# Patient Record
Sex: Female | Born: 1945 | Race: White | Hispanic: No | Marital: Married | Smoking: Former smoker
Health system: Southern US, Community
[De-identification: ages and names within clinical notes are randomized; demographics above are authoritative.]

## PROBLEM LIST (undated history)

## (undated) DIAGNOSIS — E0500 Thyrotoxicosis with diffuse goiter without thyrotoxic crisis or storm: Secondary | ICD-10-CM

## (undated) DIAGNOSIS — H409 Unspecified glaucoma: Secondary | ICD-10-CM

## (undated) DIAGNOSIS — I10 Essential (primary) hypertension: Secondary | ICD-10-CM

## (undated) DIAGNOSIS — G2581 Restless legs syndrome: Secondary | ICD-10-CM

## (undated) DIAGNOSIS — E119 Type 2 diabetes mellitus without complications: Secondary | ICD-10-CM

## (undated) DIAGNOSIS — F32A Depression, unspecified: Secondary | ICD-10-CM

## (undated) DIAGNOSIS — M1990 Unspecified osteoarthritis, unspecified site: Secondary | ICD-10-CM

## (undated) DIAGNOSIS — N6009 Solitary cyst of unspecified breast: Secondary | ICD-10-CM

## (undated) DIAGNOSIS — E7800 Pure hypercholesterolemia, unspecified: Secondary | ICD-10-CM

## (undated) DIAGNOSIS — M779 Enthesopathy, unspecified: Secondary | ICD-10-CM

## (undated) DIAGNOSIS — Z86718 Personal history of other venous thrombosis and embolism: Secondary | ICD-10-CM

## (undated) DIAGNOSIS — D689 Coagulation defect, unspecified: Secondary | ICD-10-CM

## (undated) DIAGNOSIS — R7303 Prediabetes: Secondary | ICD-10-CM

## (undated) DIAGNOSIS — E785 Hyperlipidemia, unspecified: Secondary | ICD-10-CM

## (undated) DIAGNOSIS — K635 Polyp of colon: Secondary | ICD-10-CM

## (undated) DIAGNOSIS — T7840XA Allergy, unspecified, initial encounter: Secondary | ICD-10-CM

## (undated) DIAGNOSIS — E0590 Thyrotoxicosis, unspecified without thyrotoxic crisis or storm: Secondary | ICD-10-CM

## (undated) DIAGNOSIS — M8580 Other specified disorders of bone density and structure, unspecified site: Secondary | ICD-10-CM

## (undated) DIAGNOSIS — I82409 Acute embolism and thrombosis of unspecified deep veins of unspecified lower extremity: Secondary | ICD-10-CM

## (undated) DIAGNOSIS — K219 Gastro-esophageal reflux disease without esophagitis: Secondary | ICD-10-CM

## (undated) DIAGNOSIS — F419 Anxiety disorder, unspecified: Secondary | ICD-10-CM

## (undated) DIAGNOSIS — B019 Varicella without complication: Secondary | ICD-10-CM

## (undated) HISTORY — DX: Prediabetes: R73.03

## (undated) HISTORY — DX: Acute embolism and thrombosis of unspecified deep veins of unspecified lower extremity: I82.409

## (undated) HISTORY — DX: Other specified disorders of bone density and structure, unspecified site: M85.80

## (undated) HISTORY — DX: Unspecified glaucoma: H40.9

## (undated) HISTORY — DX: Restless legs syndrome: G25.81

## (undated) HISTORY — DX: Thyrotoxicosis with diffuse goiter without thyrotoxic crisis or storm: E05.00

## (undated) HISTORY — DX: Polyp of colon: K63.5

## (undated) HISTORY — DX: Unspecified osteoarthritis, unspecified site: M19.90

## (undated) HISTORY — DX: Allergy, unspecified, initial encounter: T78.40XA

## (undated) HISTORY — DX: Enthesopathy, unspecified: M77.9

## (undated) HISTORY — DX: Varicella without complication: B01.9

## (undated) HISTORY — DX: Anxiety disorder, unspecified: F41.9

## (undated) HISTORY — DX: Coagulation defect, unspecified: D68.9

## (undated) HISTORY — DX: Personal history of other venous thrombosis and embolism: Z86.718

## (undated) HISTORY — PX: BREAST CYST ASPIRATION: SHX578

## (undated) HISTORY — PX: DILATION AND CURETTAGE OF UTERUS: SHX78

## (undated) HISTORY — DX: Pure hypercholesterolemia, unspecified: E78.00

## (undated) HISTORY — DX: Depression, unspecified: F32.A

## (undated) HISTORY — DX: Essential (primary) hypertension: I10

## (undated) HISTORY — DX: Type 2 diabetes mellitus without complications: E11.9

## (undated) HISTORY — DX: Solitary cyst of unspecified breast: N60.09

## (undated) HISTORY — PX: EXCISION, BONE SPUR: SHX3949

---

## 1999-09-17 ENCOUNTER — Encounter: Payer: Self-pay | Admitting: Obstetrics and Gynecology

## 1999-09-17 ENCOUNTER — Encounter: Admission: RE | Admit: 1999-09-17 | Discharge: 1999-09-17 | Payer: Self-pay | Admitting: Obstetrics and Gynecology

## 2000-09-26 ENCOUNTER — Encounter: Admission: RE | Admit: 2000-09-26 | Discharge: 2000-09-26 | Payer: Self-pay | Admitting: Obstetrics and Gynecology

## 2000-09-26 ENCOUNTER — Encounter: Payer: Self-pay | Admitting: Obstetrics and Gynecology

## 2001-06-11 ENCOUNTER — Other Ambulatory Visit: Admission: RE | Admit: 2001-06-11 | Discharge: 2001-06-11 | Payer: Self-pay | Admitting: Obstetrics and Gynecology

## 2001-07-07 ENCOUNTER — Encounter: Payer: Self-pay | Admitting: Obstetrics and Gynecology

## 2001-07-07 ENCOUNTER — Encounter: Admission: RE | Admit: 2001-07-07 | Discharge: 2001-07-07 | Payer: Self-pay | Admitting: Obstetrics and Gynecology

## 2001-10-29 ENCOUNTER — Encounter: Admission: RE | Admit: 2001-10-29 | Discharge: 2001-10-29 | Payer: Self-pay | Admitting: Obstetrics and Gynecology

## 2001-10-29 ENCOUNTER — Encounter: Payer: Self-pay | Admitting: Obstetrics and Gynecology

## 2008-05-05 ENCOUNTER — Ambulatory Visit
Admission: RE | Admit: 2008-05-05 | Disposition: A | Discharge status: Home / Self Care | Payer: Self-pay | Source: Ambulatory Visit

## 2010-02-19 ENCOUNTER — Ambulatory Visit
Admission: RE | Admit: 2010-02-19 | Disposition: A | Discharge status: Home / Self Care | Payer: Self-pay | Source: Ambulatory Visit

## 2010-02-27 ENCOUNTER — Ambulatory Visit
Admission: RE | Admit: 2010-02-27 | Disposition: A | Discharge status: Home / Self Care | Payer: Self-pay | Source: Ambulatory Visit

## 2012-07-22 HISTORY — PX: COLONOSCOPY: SHX174

## 2012-10-12 ENCOUNTER — Encounter (INDEPENDENT_AMBULATORY_CARE_PROVIDER_SITE_OTHER): Payer: Self-pay | Admitting: Internal Medicine

## 2012-10-12 DIAGNOSIS — E0500 Thyrotoxicosis with diffuse goiter without thyrotoxic crisis or storm: Secondary | ICD-10-CM | POA: Insufficient documentation

## 2012-12-18 ENCOUNTER — Encounter (INDEPENDENT_AMBULATORY_CARE_PROVIDER_SITE_OTHER): Payer: Self-pay | Admitting: Internal Medicine

## 2012-12-18 NOTE — Progress Notes (Signed)
Outside lab

## 2012-12-22 ENCOUNTER — Encounter (INDEPENDENT_AMBULATORY_CARE_PROVIDER_SITE_OTHER): Payer: Self-pay | Admitting: Internal Medicine

## 2012-12-22 ENCOUNTER — Ambulatory Visit (INDEPENDENT_AMBULATORY_CARE_PROVIDER_SITE_OTHER): Payer: Medicare PPO | Admitting: Internal Medicine

## 2012-12-22 VITALS — BP 100/62 | HR 60 | Ht 65.75 in | Wt 199.0 lb

## 2012-12-22 NOTE — Patient Instructions (Addendum)
Below is a summary of information and instructions we reviewed at today's appointment. Please review this information carefully and call if you have any questions regarding it.     Thyroid: Your thyroid condition is stable. Continue off  thyroid medication. You are due for thyroid labs again in 6 months. . If you have any recurrent symptoms of hyperthyroidism such as palpitations, please call the office.        Elevated fasting glucose: We have reviewed that given the presence of elevated fasting glucose, you are at increased risk for developing diabetes. You can lower this risk or slow the rate at which you progress toward diabetes by working on your weight, increasing your exercise, with a goal of 30 minutes 5 days per week, and decreasing your intake of starches and simple sugars.

## 2012-12-22 NOTE — Progress Notes (Signed)
Provider Progress Note      Patient Name:  Lucynda Rosano [[id number]] DOB: [birth year]  Date: 12/23/2012       Subjective:         Kai Railsback is a 67 y.o. female seen for followup of Graves' disease. Since last visit she has been well overall but had labs for her PCP and fasting glucose was 111 and she was told to improve her diet and activity level. The patient notes no pain in the thyroid, enlargement of the thyroid, hoarseness or dysphagia. There have been no symptoms of hyperthyroidism such as tremors, nervousness, palpitations or sleep disturbance. There have been no symptoms of hypothyroidism such as constipation, cold intolerance, lethargy or dry skin. and Patient denies thyroid eye symptoms such as irritation, diplopia, photophobia, or increased prominence other than some dry eyes. .      Review of Systems  As above  The following portions of the patient's history were reviewed and updated as appropriate: allergies, current medications, past family history, past medical history, past social history and past surgical history.    Medications  Current Outpatient Prescriptions   Medication Sig Dispense Refill   . amitriptyline (ELAVIL) 10 MG tablet Take 10 mg by mouth nightly as needed.       Marland Kitchen amLODIPine-benazepril (LOTREL 5-20) 5-20 MG per capsule Take 1 capsule by mouth daily.       Marland Kitchen atorvastatin (LIPITOR) 20 MG tablet Take 20 mg by mouth daily.       . bimatoprost (LUMIGAN) 0.03 % ophthalmic drops Place 1 drop into both eyes nightly.       . brimonidine (ALPHAGAN) 0.2 % ophthalmic solution Place 1 drop into both eyes 2 (two) times daily.       . Calcium Carbonate-Vitamin D (CALTRATE 600+D PO) Take 1 tablet by mouth daily.       . Cholecalciferol (VITAMIN D PO) Take 5,000 IU by mouth daily.       Marland Kitchen CO-ENZYME Q-10 PO Take 300 mg by mouth daily.       . dorzolamide-timolol (COSOPT) 22.3-6.8 MG/ML ophthalmic solution Apply 1 drop to eye 2 (two) times daily.       Marland Kitchen FLUoxetine (PROZAC) 10 MG tablet Take  10 mg by mouth daily.       . Multiple Vitamin (MULTIVITAMIN) tablet Take 1 tablet by mouth daily.       . Omega-3 Fat Ac-Cholecalciferol (OMEGA ESSENTIALS/VIT D3) Liquid Take 750 mg by mouth daily.       Marland Kitchen omeprazole (PRILOSEC) 20 MG capsule Take 20 mg by mouth daily.                Objective:      Vital Signs: BP 100/62  Pulse 60  Ht 1.67 m (5' 5.75")  Wt 90.266 kg (199 lb)  BMI 32.37 kg/m2  Body mass index is 32.37 kg/(m^2).  PE  Well nourished, well appearing, in no acute distress.  HEENT: There is no stare or lid lag. There is no periorbital edema. Extraoccular movements are intact. No conjunctival injection.   Neck is supple without adenopathy.  The thyroid is normal in size and consistency and no nodules are appreciated.  Lungs are clear to auscultation.  Cardiac exam reveals a regular rate and rhythm. No murmur or gallop is appreciated.   Exam of the extremities reveals no peripheral edema.   Peripheral pulses are normal.   On neurologic exam, the patient is alert and appropriate. Gait and speech are normal.There  is no tremor of the outstretched hands. Reflexes are 1+ and symmetrical with  normal relaxation phases.   Skin is cool and dry.      Lab and Imaging Review  Fasting glucose 111, TSH/TFTs normal.    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 Impaired fasting glucose    Graves is in remission. Will recheck TSH in 6 weeks and 1 year. If both are normal, will refer her back to PCP for annual TSH testing. If she has recurrent symptoms of hyperthyroidism, asked her to call.  We have reviewed that given the presence of elevated fasting glucose, the patient is at increased risk for developing diabetes, and the patient was counseled about lifestyle modification as outlined below.           Plan:      See plan outlined in patient instructions below.    Patient Instructions   Below is a summary of information and instructions we reviewed at today's  appointment. Please review this information carefully and call if you have any questions regarding it.     Thyroid: Your thyroid condition is stable. Continue off  thyroid medication. You are due for thyroid labs again in 6 months. . If you have any recurrent symptoms of hyperthyroidism such as palpitations, please call the office.        Elevated fasting glucose: We have reviewed that given the presence of elevated fasting glucose, you are at increased risk for developing diabetes. You can lower this risk or slow the rate at which you progress toward diabetes by working on your weight, increasing your exercise, with a goal of 30 minutes 5 days per week, and decreasing your intake of starches and simple sugars.         Return in about 1 year (around 12/22/2013).    Vela Prose, MD

## 2013-01-06 ENCOUNTER — Encounter (INDEPENDENT_AMBULATORY_CARE_PROVIDER_SITE_OTHER): Payer: Self-pay | Admitting: Specialist

## 2013-01-06 ENCOUNTER — Encounter (INDEPENDENT_AMBULATORY_CARE_PROVIDER_SITE_OTHER): Payer: Self-pay

## 2013-01-06 NOTE — Progress Notes (Signed)
Miralax Prep Instructions    INSURANCE ISSUES:  If your colonoscopy has been scheduled for screening (meaning that you have no symptoms), and a polyp is found, it will removed during your colonoscopy. This will change your colonoscopy from a screening to a diagnostic procedure. This may change how your colonoscopy is billed. Patients with a history of colon polyps are not considered a screening colonoscopy and your insurance may not cover as a screening benefit.   Please check your benefits prior to your scheduled procedure.  Our office will call your insurance for preauthorization.  If you do not have insurance, please call the billing office at [phone] to make financial arrangements.     If you cannot make your appointment time, please immediately notify our office at [phone].      Your colonoscopy has been tentatively scheduled for February 11, 2013 .   You will receive a call from the OR scheduler within 2-3 days prior to your scheduled procedure date to find out what time to arrive for the procedure at the hospital.      The day of your procedure you will need to go to the Surgery Center at, [facility].  You can expect to spend between 2-3 hours at the Surgery Center after your arrival.       Due to being sedated during your procedure, you need to have a responsible adult drive you and pick you up from the hospital.  You will not be able to call a cab.     If you take heart or blood pressure medication, you may take them with a sip of water before coming to the hospital. Do not take any other medication in the morning.  You may resume to your normal medication schedule after your colonoscopy.     If you take diabetic medicines, please follow the following instructions:            a.  Insulin - take  dose night before and hold the morning of the procedure            b.  Glucophage/Metformin/Glucovance - hold day of prep and morning of the                 procedure   If you take a blood  thinner please follow the following instructions:  You will need to hold, aspirin and for 5 days                           To prepare for your test    Items you will need from the pharmacy/store:  Miralax - 8.3 ounces (238 grams) size bottle   Four Dulcolax Laxative Tablets (5mg )   Approximately 64 ounces of Gatorade or Crystal Light (Diabetics: Crystal Light) - Chilled if desired     The day before your procedure:  You will be on a clear liquid diet beginning the day prior to your procedure.  Do not consume drinks or Jell-O with red or purple coloring.  You are encouraged to drink plenty of Water!  In addition, you may have the following:            - Gatorade, Powerade, Soda, Crystal Light, Apple Juice, White Grape Juice, and             [employer].          - Jello-O (sugar free or regular)          -  Clear soup broth or bouillon, no solids          - Black coffee or tea (no milk or cream)          - You can have up to 3 eight ounce cans of Ensure or Sustacal  If you are a diabetic, do not use sugar free liquids for the first 24 hours before your procedure.  Mix the ENTIRE bottle of Miralax with the 64 ounces of Gatorade/Crystal Light and refrigerate until needed.   Take four (4) Dulcolax or Bisacodyl tablets between 4-6pm.   Two hours after taking the Dulcolax (Bisacodyl) tablets, begin drinking the Miralax solution.  Finish 6 cups (8 ounces every 10 - 15 minutes) before you go to bed. You should have about 2 cups of solution leftover for the morning of your procedure.     **Most people have a bowel movement an hour after starting your prep; some people may take longer. Even if you are having diarrhea prior to starting the prep, you need to compete the full prep.     YOU MUST COMPLETE ABOVE STEP.  It is not usual to have some nausea and/or abdominal cramping.  If this happens, you may want to wait 30-40 minutes before continuing with the solution.  However, all the steps need to be completed to have a successful  colonoscopy.      The day of your procedure:    4 Hours before arriving at the hospital, you will drink the remaining prep, 8 ounces every 10 minutes until gone.  You should have no additional clear liquids at this time.  Brushing your teeth/dentures is permitted.   If you have any medication that you need to take the morning of your procedure, do so with a small sip of water only.   If bowel movements are not clear (transparent yellow water) about 1 hour after your morning dose, call [phone] for further instructions after 8:00am.           Colonoscopyis used to view the inside of your lower digestive tract (colon and rectum). It can help screen for colon cancer and can also help find the source of abdominal pain,bleeding,and changes in bowel habits. The test is usually done in the hospital on an outpatient basis. During the exam, the doctor can remove a small tissue sample ( a biopsy) for testing. Small growths, such as polyps, may also be removed during colonoscopy.  A camera attached to a flexible tube with a viewing lens is used to take video pictures.    Getting Ready   Be sure to tell your doctor about any medications you take. Alsotell your doctor about any health conditions you may have.   Discuss the risks of the test with your doctor. These includebleeding and bowel puncture.   Your rectum and colon must be empty for the test. So be sure tofollow the diet and bowel prep instructions exactly. If you don't, the test may need to be rescheduled.   Ask your doctor whether you need to have a friend or family member prepared to drive you home after the test.  Colonoscopy provides an inside view of the entire colon.                            During the Test   You are given sedating (relaxing) medication through an IV line.You may be drowsy or completely asleep.   The procedure takes  or longer.   The doctor performs a digital rectal exam to check for anal andrectal problems. The  rectum is lubricated and the scope inserted.   If you are awake, you may have a feeling similar to needing to have a bowelmovement. You may also feel pressure as air is pumped into the colon. It's okay to pass gas during the procedure.      After the Test   You may discuss the results with your doctor right away or at a future visit.   Try to pass all the gas right after the test to help prevent bloating and cramping.   After the test, you can go back to your normal eating andother activities.  Risks and Possible Complications Include:   Bleeding  A puncture or tear in the colon  Risks of anesthesia       [address], [place], [place] [address]. All rights reserved. This information is not intended as a substitute for professional medical care. Always follow your healthcare professional's instructions.

## 2013-01-14 ENCOUNTER — Other Ambulatory Visit (INDEPENDENT_AMBULATORY_CARE_PROVIDER_SITE_OTHER): Payer: Self-pay

## 2013-02-05 ENCOUNTER — Ambulatory Visit: Admit: 2013-02-05 | Payer: Self-pay | Admitting: Specialist

## 2013-02-05 SURGERY — DONT USE, USE 1094-COLONOSCOPY, DIAGNOSTIC (SCREENING)
Anesthesia: Conscious Sedation

## 2013-02-11 ENCOUNTER — Ambulatory Visit: Payer: Medicare PPO | Admitting: Specialist

## 2013-02-11 ENCOUNTER — Encounter: Payer: Self-pay | Admitting: Certified Registered"

## 2013-02-11 ENCOUNTER — Ambulatory Visit: Payer: Medicare PPO | Admitting: Certified Registered"

## 2013-02-11 ENCOUNTER — Encounter: Payer: Self-pay | Admitting: Specialist

## 2013-02-11 ENCOUNTER — Ambulatory Visit
Admission: RE | Admit: 2013-02-11 | Discharge: 2013-02-11 | Disposition: A | Discharge status: Home / Self Care | Payer: Medicare PPO | Source: Ambulatory Visit | Attending: Specialist | Admitting: Specialist

## 2013-02-11 ENCOUNTER — Encounter
Admission: RE | Disposition: A | Discharge status: Home / Self Care | Payer: Self-pay | Source: Ambulatory Visit | Attending: Specialist

## 2013-02-11 DIAGNOSIS — M129 Arthropathy, unspecified: Secondary | ICD-10-CM | POA: Insufficient documentation

## 2013-02-11 DIAGNOSIS — Z1211 Encounter for screening for malignant neoplasm of colon: Secondary | ICD-10-CM | POA: Insufficient documentation

## 2013-02-11 DIAGNOSIS — F3289 Other specified depressive episodes: Secondary | ICD-10-CM | POA: Insufficient documentation

## 2013-02-11 DIAGNOSIS — I10 Essential (primary) hypertension: Secondary | ICD-10-CM | POA: Insufficient documentation

## 2013-02-11 DIAGNOSIS — H409 Unspecified glaucoma: Secondary | ICD-10-CM | POA: Insufficient documentation

## 2013-02-11 DIAGNOSIS — E7800 Pure hypercholesterolemia, unspecified: Secondary | ICD-10-CM | POA: Insufficient documentation

## 2013-02-11 DIAGNOSIS — D126 Benign neoplasm of colon, unspecified: Secondary | ICD-10-CM | POA: Insufficient documentation

## 2013-02-11 DIAGNOSIS — Z87891 Personal history of nicotine dependence: Secondary | ICD-10-CM | POA: Insufficient documentation

## 2013-02-11 DIAGNOSIS — K6289 Other specified diseases of anus and rectum: Secondary | ICD-10-CM | POA: Insufficient documentation

## 2013-02-11 DIAGNOSIS — Z7982 Long term (current) use of aspirin: Secondary | ICD-10-CM | POA: Insufficient documentation

## 2013-02-11 DIAGNOSIS — G2581 Restless legs syndrome: Secondary | ICD-10-CM | POA: Insufficient documentation

## 2013-02-11 DIAGNOSIS — E782 Mixed hyperlipidemia: Secondary | ICD-10-CM | POA: Insufficient documentation

## 2013-02-11 DIAGNOSIS — E119 Type 2 diabetes mellitus without complications: Secondary | ICD-10-CM | POA: Insufficient documentation

## 2013-02-11 DIAGNOSIS — E0500 Thyrotoxicosis with diffuse goiter without thyrotoxic crisis or storm: Secondary | ICD-10-CM | POA: Insufficient documentation

## 2013-02-11 HISTORY — PX: COLONOSCOPY: SHX174

## 2013-02-11 SURGERY — DONT USE, USE 1094-COLONOSCOPY, DIAGNOSTIC (SCREENING)
Anesthesia: Anesthesia MAC / Sedation | Site: Anus | Wound class: Dirty or Infected

## 2013-02-11 MED ORDER — MIDAZOLAM HCL 2 MG/2ML IJ SOLN
INTRAMUSCULAR | Status: DC | PRN
Start: 2013-02-11 — End: 2013-02-11
  Administered 2013-02-11: 11:00:00 5 mg via INTRAVENOUS

## 2013-02-11 MED ORDER — LACTATED RINGERS IV SOLN
INTRAVENOUS | Status: DC | PRN
Start: 2013-02-11 — End: 2013-02-11

## 2013-02-11 MED ORDER — LIDOCAINE HCL (PF) 2 % IJ SOLN
INTRAMUSCULAR | Status: DC | PRN
Start: 2013-02-11 — End: 2013-02-11
  Administered 2013-02-11: 11:00:00 40 mg via INTRADERMAL

## 2013-02-11 MED ORDER — PROPOFOL 10 MG/ML IV EMUL
INTRAVENOUS | Status: DC | PRN
Start: 2013-02-11 — End: 2013-02-11
  Administered 2013-02-11: 11:00:00 30 mg via INTRAVENOUS
  Administered 2013-02-11: 11:00:00 20 mg via INTRAVENOUS

## 2013-02-11 MED ORDER — FENTANYL CITRATE 0.05 MG/ML IJ SOLN
INTRAMUSCULAR | Status: DC | PRN
Start: 2013-02-11 — End: 2013-02-11
  Administered 2013-02-11: 100 ug via INTRAVENOUS

## 2013-02-11 SURGICAL SUPPLY — 26 items
BASIN EMESIS (Supply) ×2 IMPLANT
BASKET RETRIEVAL ROTH NET STD (Supply) ×1 IMPLANT
BRUSH CLEANING 639 (Supply) ×2 IMPLANT
CANNULA ADULT NASAL (Supply) ×1 IMPLANT
CRE BALLOON 12-15 DILATOR 5836 (TDC (Tubes, Draines, Catheters))
CRE BALLOON 12-15 DILATOR 5836 (TDC (Tubes, Drains, Catheters)) ×1 IMPLANT
CRE BALLOON 15-18 DILATOR 5837 (Supply) ×1 IMPLANT
CRE BALLOON 8-10 DILAT 5834 (Supply) ×2 IMPLANT
FORCEP BIOPSY ALLIGA JAW  2.8 (Supply) ×1 IMPLANT
FORCEP BIOPSY DISP. (Supply) ×2 IMPLANT
FORCEP BIOPSY HOT ALLIGATOR  D (Supply) ×1 IMPLANT
FORCEP RADIAL JAW JUMBO 240CM (Supply) ×1 IMPLANT
FORMALIN 15ML CONTAINER (BY CS (Supply) ×2 IMPLANT
LINER SUCTION 1500 RED (Supply) ×2 IMPLANT
MARKER ENDOSCOPIC SPOT (Supply) ×1 IMPLANT
MASK OXYGEN W/TUBING ADULT (Supply) ×2 IMPLANT
NDL BLUNT FILL 18GA X 1.5 (Supply) ×2 IMPLANT
PAD GROUNDING REM  (ELECTRODE) (Supply) ×1 IMPLANT
QUICK CLIP II SGL USE ROTATABL (Supply) ×1 IMPLANT
SNARE WIRE DISP 25MM LOOP (Supply) ×1 IMPLANT
SOL WATER STERILE IRRG 500ML (Solutions) ×1 IMPLANT
SYRINGE 50CC LL  NO WIDE FLANG (Supply) ×1 IMPLANT
TRAP SPECIMEN SMT1005 (Supply) ×1 IMPLANT
TUBE CONNECT SX 12IN STERILE (Supply) ×2 IMPLANT
TUBING SUCTION 10FT STERILE (Supply) ×1 IMPLANT
UNDERPAD BLUE 23X36  LF (Supply) ×3 IMPLANT

## 2013-02-11 NOTE — Consults (Signed)
BRIEF HISTORY AND PHYSICAL EXAM    Date Time: 02/11/2013 10:17 AM  Patient Name: Brittany Berry  Attending Physician: Dineen Kid, MD    Assessment:     --  Patient needs a screening colonoscopy.   She is asymptomatic.      --  No increased risk factors for colon cancer.      --  She is unsure, but thinks she was told that she had colitis at some point in the more remote past.  Currently, she is asymptomatic.        Plan:     --  For screening colonoscopy.  I outlined the nature of a colonoscopy with the patient.  All questions were answered.  Risks  (include, but are not limited to:   bleeding, infection, injury or perforation of the colon) and benefits were reviewed.  She understands these complications could require emergency abdominal surgery.  She understands and wishes to proceed.          History of Present Illness:     Brittany Berry is a 66 y.o. female who is accompanied by her husband.  She presents to the hospital with a desire to have a screening colonoscopy.  Last colonoscopy was about 10 years ago.   She has no colon symptoms.  No diarrhea or constipation.  No blood in her stools.      She recalls being told that she had colitis at some point in the more remote past.  She does not remember details.  Currently, she is asymptomatic.            Past Medical History:     Past Medical History   Diagnosis Date   . Arthritis    . Unspecified glaucoma(365.9)    . Mixed hyperlipidemia    . Essential hypertension, benign    . Tendonitis    . 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 Restless legs syndrome (RLS)    . Depression    . High cholesterol    . Diabetes mellitus      [facility]- NO MEDS REQUIRED AT THIS TIME.       Past Surgical History:     Past Surgical History   Procedure Date   . 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 Dilation and curettage of uterus    . Excision, bone spur      left little toe        Family History:     Family History   Problem  Relation Age of Onset   . Thyroid disease Sister    . Cancer Other      BREAST CANCER.   . Colon polyps Other    . Anesthesia problems Other        Social History:     History     Social History   . Marital Status: Married     Spouse Name: N/A     Number of Children: N/A   . Years of Education: N/A     Social History Main Topics   . Smoking status: Former Smoker     Quit date: 07/22/1982   . Smokeless tobacco: Never Used   . Alcohol Use: Yes      Comment: wine 3/week   . Drug Use: No   . Sexually Active: Not on file     Other Topics Concern   . Not on file  Social History Narrative   . No narrative on file       Allergies:     No Known Allergies    Medications:     Prescriptions prior to admission   Medication Sig   . amitriptyline (ELAVIL) 10 MG tablet Take 10 mg by mouth nightly as needed.   Marland Kitchen amLODIPine-benazepril (LOTREL 5-20) 5-20 MG per capsule Take 1 capsule by mouth daily.   Marland Kitchen aspirin 81 MG tablet Take 81 mg by mouth daily.   Marland Kitchen atorvastatin (LIPITOR) 20 MG tablet Take 20 mg by mouth daily.   . bimatoprost (LUMIGAN) 0.03 % ophthalmic drops Place 1 drop into both eyes nightly.   . brimonidine (ALPHAGAN) 0.2 % ophthalmic solution Place 1 drop into both eyes 2 (two) times daily.   . Calcium Carbonate-Vitamin D (CALTRATE 600+D PO) Take 1 tablet by mouth daily.   . Cetirizine HCl (ZYRTEC PO) Take by mouth.   . Cholecalciferol (VITAMIN D PO) Take 5,000 IU by mouth daily.   Marland Kitchen CO-ENZYME Q-10 PO Take 300 mg by mouth daily.   . dorzolamide-timolol (COSOPT) 22.3-6.8 MG/ML ophthalmic solution Apply 1 drop to eye 2 (two) times daily.   Marland Kitchen FLUoxetine (PROZAC) 10 MG tablet Take 10 mg by mouth daily.   . mometasone (NASONEX) 50 MCG/ACT nasal spray 2 sprays by Nasal route daily.   . Multiple Vitamin (MULTIVITAMIN) tablet Take 1 tablet by mouth daily.   . Omega-3 Fat Ac-Cholecalciferol (OMEGA ESSENTIALS/VIT D3) Liquid Take 750 mg by mouth daily.   Marland Kitchen omeprazole (PRILOSEC) 20 MG capsule Take 20 mg by mouth daily.       Review  of Systems:     See HPI.           Physical Exam:     Filed Vitals:    02/11/13 0857   BP: 107/69   Pulse: 79   Temp: 98.8 F (37.1 C)   Resp: 18   SpO2: 95%       Intake and Output Summary (Last 24 hours) at Date Time  No intake or output data in the 24 hours ending 02/11/13 1017        GENERAL:  Awake and cooperative.  Good memory and judgement.    NEURO:  Moves all 4 extremities.  Ambulatory.      HEENT:  Sclera is white.  Conjunctiva pink.   No icterus.  Atraumatic scalp.  Throat is clear.    NECK:  Supple.  No adenopathy.      CHEST:  Clear bilaterally.  No wheezes.      CARDIAC:  Regular.  Normal cardiac sounds.  No murmur.      ABDOMEN:  Soft.  Non-distended.  Non-tender.  No hernias.  No abdominal masses.      EXTREMITIES:  No pedal edema.   No calf tenderness.                Labs:     Results     ** No Results found for the last 24 hours. **              Rads:     Radiological Procedure reviewed.    @RAD7DAY @      Signed by: Dineen Kid, MD          I hereby certify this patient for hospitalization based upon medical necessity as noted above.

## 2013-02-11 NOTE — Discharge Instructions (Signed)
Call the office or go to the nearest emergency department if symptoms are not better, or if the symptoms become worse.  Call with any future questions or concerns.         This information is intended to inform and educate, and it is not a replacement for medical evaluation, advice, diagnosis, or treatment by a healthcare professional.         [facility]  Colonoscopy Discharge Instructions    1.  Since you have had sedation, for the next 24 hours it is best to:   A.  Have someone stay with you for the next 3-4 hours.   B.  Not drink alcoholic beverages.   C.  Not make important personal or business decisions.   D.  Not drive a vehicle or operate hazardous machinery.    2.  You may experience the following:   A.  Bloating or lack of appetite.   B.  Excessive gas   C.  Some mild rectal bleeding.   D.  Constipation or diarrhea.    3.  You may limit the amount of discomfort you have by:   A.  Limiting your intake to just what you feel like eating, not what you          think you should eat.   B.  Drink at least 3 glasses of water or other liquid (not alcohol) a day         more than you usually drink.            4.  Please call us at [phone] if you observe any of the following:   A.  Excessive bleeding from the rectum.  More than 1 piece of toilet paper        or in 2 consecutive stools.   B.  Pain in the stomach that is getting worse instead of better over a        2-3 hour period.   C.  Nausea or vomiting.   D.  Increasing tenderness of the abdominal area throughout the day.   E.  Other unusual symptoms.     5.  Results of the colonoscopy:  Dr. Laurell Roof will call next week.      IF YOU THINK YOU HAVE AN EMERGENCY, PLEASE GO TO THE NEAREST EMERGENCY DEPARTMENT OR CALL 911.

## 2013-02-11 NOTE — Anesthesia Postprocedure Evaluation (Signed)
Anesthesia Post Evaluation    Patient: Brittany Berry    Procedures performed: Procedure(s) with comments:  COLONOSCOPY    Anesthesia type: MAC    Patient location:Phase II PACU    Last vitals:   Filed Vitals:    02/11/13 1204   BP: 108/65   Pulse: 55   Temp: 96.8 F (36 C)   Resp: 18   SpO2: 96%       Post pain: Patient not complaining of pain, continue current therapy      Mental Status:awake and alert     Respiratory Function: tolerating room air    Cardiovascular: stable    Nausea/Vomiting: patient not complaining of nausea or vomiting    Hydration Status: adequate    Post assessment: no apparent anesthetic complications, no reportable events and no evidence of recall

## 2013-02-11 NOTE — Op Note (Signed)
PROCEDURE NOTE    Date & Time:  02/11/2013   11:03 AM    Patient Name:     Brittany Berry    Operative Procedure:     Colonoscopy -- with random biopsies.       Preoperative Diagnosis:     Screening colonoscopy.    May have h/o colitis.          Postoperative Diagnosis:     Same.      Anesthesia:     IV sedation.    Estimated Blood Loss:     Minimal.    Implants:     None.      Specimens:     1)  Random colon biopsies.       Findings:     1)  Normal colon to the cecum.       2)  No polyps.  No diverticuli.  No visible inflammation.       3)  Random biopsies obtained.      4)  Diminished anal sphincter tone.         Complications:     No immediate complications.    Summary of Procedure:       Informed consent had been obtained.  A time-out was performed.  Leila Schuff was placed in the left-lateral decubitus position.  Supplemental oxygen was provided.  Full hemodynamic monitoring was utilized w/ cardiac monitor, pulse ox, and blood pressure monitoring.  IV sedation was administered by the anesthesia department.  See sedation records for further details.       A rectal exam was performed and was remarkable for diminished anal sphincter tone.  A colonoscope was inserted through the anus and into the rectum.  The colonoscope was advanced under direct vision through the length of the colon to the cecum.  The cecum was intubated.  The scope was then slowly withdrawn.    The bowel prep was adequate.      The cecum was normal.  The ascending colon, transverse colon, descending colon, and sigmoid colon were normal.  No polyps were noted.  No diverticular changes were seen.  No inflammation was identified.  The rectum was normal.      Because of the patient's h/o possible colitis, random biopsies were obtained throughout the length of the colon.  The scope was removed, and the procedure was terminated.      She tolerated the procedure well.  No immediate complications were noted.    She was transferred to [facility] in  stable condition.          Signed by: Dineen Kid, MD                                                                             [facility]

## 2013-02-11 NOTE — Anesthesia Preprocedure Evaluation (Addendum)
Anesthesia Evaluation    AIRWAY    Mallampati: II    TM distance: >3 FB  Neck ROM: full  Mouth Opening:full   CARDIOVASCULAR    cardiovascular exam normal       DENTAL           PULMONARY    pulmonary exam normal     OTHER FINDINGS                  Pre-evaluation Note Incomplete - DO NOT USE FOR CLINICAL DECISIONS    Anesthesia Plan    ASA 2     MAC                                 informed consent obtained

## 2013-02-11 NOTE — Transfer of Care (Signed)
Anesthesia Transfer of Care Note    Patient: Brittany Berry    Last vitals:   Filed Vitals:    02/11/13 1106   BP: 98/65   Pulse: 55   Temp: 98.8 F (37.1 C)   Resp: 18   SpO2: 95%       Oxygen: Room Air     Mental Status:awake and alert     Airway: Natural    Cardiovascular Status:  stable

## 2013-02-16 ENCOUNTER — Encounter: Payer: Self-pay | Admitting: Specialist

## 2013-02-19 HISTORY — PX: COLONOSCOPY: SHX174

## 2013-02-26 ENCOUNTER — Encounter (INDEPENDENT_AMBULATORY_CARE_PROVIDER_SITE_OTHER): Payer: Self-pay | Admitting: Specialist

## 2013-03-05 ENCOUNTER — Other Ambulatory Visit: Payer: Self-pay | Admitting: Family Medicine

## 2013-03-12 ENCOUNTER — Ambulatory Visit
Admission: RE | Admit: 2013-03-12 | Discharge: 2013-03-12 | Disposition: A | Discharge status: Home / Self Care | Payer: Medicare PPO | Source: Ambulatory Visit | Attending: Family Medicine | Admitting: Family Medicine

## 2013-03-12 DIAGNOSIS — Z1231 Encounter for screening mammogram for malignant neoplasm of breast: Secondary | ICD-10-CM | POA: Insufficient documentation

## 2013-03-12 DIAGNOSIS — Z780 Asymptomatic menopausal state: Secondary | ICD-10-CM | POA: Insufficient documentation

## 2013-03-12 DIAGNOSIS — M899 Disorder of bone, unspecified: Secondary | ICD-10-CM | POA: Insufficient documentation

## 2013-03-30 DIAGNOSIS — K219 Gastro-esophageal reflux disease without esophagitis: Secondary | ICD-10-CM

## 2013-03-30 DIAGNOSIS — M1990 Unspecified osteoarthritis, unspecified site: Secondary | ICD-10-CM

## 2013-03-30 DIAGNOSIS — E785 Hyperlipidemia, unspecified: Secondary | ICD-10-CM

## 2013-03-30 DIAGNOSIS — M8580 Other specified disorders of bone density and structure, unspecified site: Secondary | ICD-10-CM

## 2013-03-30 DIAGNOSIS — G8929 Other chronic pain: Secondary | ICD-10-CM

## 2013-03-30 DIAGNOSIS — R519 Headache, unspecified: Secondary | ICD-10-CM

## 2013-03-30 DIAGNOSIS — E0500 Thyrotoxicosis with diffuse goiter without thyrotoxic crisis or storm: Secondary | ICD-10-CM

## 2013-03-30 HISTORY — DX: Thyrotoxicosis with diffuse goiter without thyrotoxic crisis or storm: E05.00

## 2013-03-30 HISTORY — DX: Other chronic pain: G89.29

## 2013-03-30 HISTORY — DX: Gastro-esophageal reflux disease without esophagitis: K21.9

## 2013-03-30 HISTORY — DX: Unspecified osteoarthritis, unspecified site: M19.90

## 2013-03-30 HISTORY — DX: Hyperlipidemia, unspecified: E78.5

## 2013-03-30 HISTORY — DX: Headache, unspecified: R51.9

## 2013-03-30 HISTORY — DX: Other specified disorders of bone density and structure, unspecified site: M85.80

## 2013-03-31 ENCOUNTER — Encounter (INDEPENDENT_AMBULATORY_CARE_PROVIDER_SITE_OTHER): Payer: Self-pay | Admitting: Internal Medicine

## 2013-07-19 ENCOUNTER — Encounter (RURAL_HEALTH_CENTER): Payer: Self-pay

## 2013-07-22 HISTORY — PX: CATARACT EXTRACTION W/ INTRAOCULAR LENS IMPLANT: SHX1309

## 2013-08-04 ENCOUNTER — Other Ambulatory Visit
Admission: RE | Admit: 2013-08-04 | Discharge: 2013-08-04 | Disposition: A | Discharge status: Home / Self Care | Payer: Medicare PPO | Source: Ambulatory Visit | Attending: Family Medicine | Admitting: Family Medicine

## 2013-08-04 ENCOUNTER — Ambulatory Visit (INDEPENDENT_AMBULATORY_CARE_PROVIDER_SITE_OTHER): Payer: Medicare PPO | Admitting: Family Medicine

## 2013-08-04 ENCOUNTER — Encounter (INDEPENDENT_AMBULATORY_CARE_PROVIDER_SITE_OTHER): Payer: Self-pay | Admitting: Family Medicine

## 2013-08-04 VITALS — BP 123/77 | HR 62 | Temp 98.5°F | Resp 16 | Ht 65.5 in | Wt 203.8 lb

## 2013-08-04 DIAGNOSIS — E669 Obesity, unspecified: Secondary | ICD-10-CM

## 2013-08-04 DIAGNOSIS — E0500 Thyrotoxicosis with diffuse goiter without thyrotoxic crisis or storm: Secondary | ICD-10-CM

## 2013-08-04 DIAGNOSIS — I10 Essential (primary) hypertension: Secondary | ICD-10-CM

## 2013-08-04 DIAGNOSIS — E785 Hyperlipidemia, unspecified: Secondary | ICD-10-CM

## 2013-08-04 LAB — COMPREHENSIVE METABOLIC PANEL
ALT: 17 U/L (ref 0–55)
AST (SGOT): 20 U/L (ref 10–42)
Albumin/Globulin Ratio: 1.23 Ratio (ref 0.70–1.50)
Albumin: 4.3 gm/dL (ref 3.5–5.0)
Alkaline Phosphatase: 75 U/L (ref 40–145)
Anion Gap: 13.8 mMol/L (ref 7.0–18.0)
BUN / Creatinine Ratio: 14.7 Ratio (ref 10.0–30.0)
BUN: 11 mg/dL (ref 7–22)
Bilirubin, Total: 0.6 mg/dL (ref 0.1–1.2)
CO2: 24.8 mMol/L (ref 20.0–30.0)
Calcium: 10.3 mg/dL (ref 8.5–10.5)
Chloride: 107 mMol/L (ref 98–110)
Creatinine: 0.75 mg/dL (ref 0.60–1.20)
EGFR: >60 mL/min/{1.73_m2}
Globulin: 3.5 gm/dL (ref 2.0–4.0)
Glucose: 106 mg/dL — ABNORMAL HIGH (ref 70–99)
Osmolality Calc: 281 mOsm/kg (ref 275–300)
Potassium: 4.6 mMol/L (ref 3.5–5.3)
Protein, Total: 7.8 gm/dL (ref 6.0–8.3)
Sodium: 141 mMol/L (ref 136–147)

## 2013-08-04 LAB — CBC AND DIFFERENTIAL
Basophils %: 1.5 % (ref 0.0–3.0)
Basophils Absolute: 0.1 10*3/uL (ref 0.0–0.3)
Eosinophils %: 3.5 % (ref 0.0–7.0)
Eosinophils Absolute: 0.2 10*3/uL (ref 0.0–0.8)
Hematocrit: 42 % (ref 36.0–48.0)
Hemoglobin: 14.5 gm/dL (ref 12.0–16.0)
Lymphocytes Absolute: 2.2 10*3/uL (ref 0.6–5.1)
Lymphocytes: 36.9 % (ref 15.0–46.0)
MCH: 31 pg (ref 28–35)
MCHC: 34 gm/dL (ref 32–36)
MCV: 91 fL (ref 80–100)
MPV: 9.1 fL (ref 6.0–10.0)
Monocytes Absolute: 0.4 10*3/uL (ref 0.1–1.7)
Monocytes: 6.6 % (ref 3.0–15.0)
Neutrophils %: 51.6 % (ref 42.0–78.0)
Neutrophils Absolute: 3 10*3/uL (ref 1.7–8.6)
PLT CT: 316 10*3/uL (ref 130–440)
RBC: 4.61 10*6/uL (ref 3.80–5.00)
RDW: 11.3 % (ref 11.0–14.0)
WBC: 5.9 10*3/uL (ref 4.0–11.0)

## 2013-08-04 LAB — POCT URINALYSIS DIPSTIX (10)(MULTI-TEST)
Bilirubin, UA POCT: NEGATIVE
Blood, UA POCT: NEGATIVE
Glucose, UA POCT: NEGATIVE mg/dL
Ketones, UA POCT: NEGATIVE mg/dL
Nitrite, UA POCT: NEGATIVE
POCT Leukocytes, UA: NEGATIVE
POCT Spec Gravity, UA: 1.005 (ref 1.001–1.035)
POCT pH, UA: 5 (ref 5–8)
Protein, UA POCT: NEGATIVE mg/dL
Urobilinogen, UA: 0.2 mg/dL

## 2013-08-04 LAB — T3, FREE: T3, Free: 2.6 pg/mL (ref 1.71–3.71)

## 2013-08-04 LAB — LIPID PANEL
Cholesterol: 188 mg/dL (ref 75–199)
Coronary Heart Disease Risk: 3.13
HDL: 60 mg/dL (ref 45–65)
LDL Calculated: 108 mg/dL
Triglycerides: 101 mg/dL (ref 10–150)
VLDL: 20 (ref 0–40)

## 2013-08-04 LAB — TSH: TSH: 1.47 u[IU]/mL (ref 0.40–4.20)

## 2013-08-04 LAB — T4, FREE: T4 Free: 0.97 ng/dL (ref 0.70–1.48)

## 2013-08-04 LAB — VITAMIN D,25 OH,TOTAL: Vitamin D 25-Hydroxy: 88 ng/mL — ABNORMAL HIGH (ref 30–80)

## 2013-08-04 MED ORDER — ATORVASTATIN CALCIUM 20 MG PO TABS
20.0000 mg | ORAL_TABLET | Freq: Every day | ORAL | Status: DC
Start: 2013-08-04 — End: 2014-08-09

## 2013-08-04 MED ORDER — NALTREXONE-BUPROPION HCL ER 8-90 MG PO TB12
8.0000 | ORAL_TABLET | ORAL | Status: DC
Start: 2013-08-04 — End: 2013-11-23

## 2013-08-04 MED ORDER — AMLODIPINE BESY-BENAZEPRIL HCL 5-20 MG PO CAPS
1.0000 | ORAL_CAPSULE | Freq: Every day | ORAL | Status: DC
Start: 2013-08-04 — End: 2014-11-07

## 2013-08-04 NOTE — Progress Notes (Signed)
Subjective:       Patient ID: Brittany Berry is a 68 y.o. female.    HPIPatient presents today for medical management of her ongoing medical problem list. She is concerned about her weight gain. Also concerned about elevated blood glucose. Readings this am fasting 211. Yesterday fasting BS 107.     The following portions of the patient's history were reviewed and updated as appropriate: allergies, current medications, past family history, past medical history, past social history, past surgical history and problem list.    Review of Systems   Constitutional: Negative.    HENT: Negative.    Eyes: Negative.    Respiratory: Negative.    Cardiovascular: Negative.    Gastrointestinal: Negative.    Genitourinary: Negative.    Musculoskeletal: Negative.    Skin: Negative.    Neurological: Negative.    Hematological: Negative.    Psychiatric/Behavioral: Negative.            Objective:    Physical Exam   Constitutional: She is oriented to person, place, and time. She appears well-developed. No distress.        obese   HENT:   Head: Normocephalic and atraumatic.   Right Ear: External ear normal.   Left Ear: External ear normal.   Nose: Nose normal.   Mouth/Throat: Oropharynx is clear and moist. No oropharyngeal exudate.   Eyes: Conjunctivae normal and EOM are normal. Pupils are equal, round, and reactive to light. Right eye exhibits no discharge. Left eye exhibits no discharge. No scleral icterus.   Neck: Normal range of motion. Neck supple. No JVD present. No tracheal deviation present. No thyromegaly present.   Cardiovascular: Normal rate, regular rhythm, normal heart sounds and intact distal pulses.  Exam reveals no gallop and no friction rub.    No murmur heard.  Pulmonary/Chest: Effort normal and breath sounds normal. No stridor. No respiratory distress. She has no wheezes. She has no rales. She exhibits no tenderness.   Abdominal: Soft. Bowel sounds are normal. She exhibits no distension and no mass. There is no  tenderness. There is no rebound and no guarding.   Musculoskeletal: Normal range of motion. She exhibits no edema and no tenderness.   Lymphadenopathy:     She has no cervical adenopathy.   Neurological: She is alert and oriented to person, place, and time. Coordination normal.   Skin: Skin is warm and dry. No rash noted. She is not diaphoretic. No erythema. No pallor.   Psychiatric: She has a normal mood and affect. Her behavior is normal. Judgment and thought content normal.           Assessment:       Type 2 DM  Dyslipidemia  HTN  Grave's Disease.      Plan:       Whole food diet, no added sugar.  Exercise 45 minutes daily.  Contrave 8-90. Titrate weekly starting at 1 po QD , by 1 tab weekly until 2 po bid.  Risk & Benefits of the new medication(s) were explained to the patient (and family) who verbalized understanding & agreed to the treatment plan. Patient (family) encouraged to contact me/clinical staff with any questions/concerns.

## 2013-08-05 LAB — HEMOGLOBIN A1C: Hgb A1C, %: 5.8 %

## 2013-08-09 ENCOUNTER — Telehealth (INDEPENDENT_AMBULATORY_CARE_PROVIDER_SITE_OTHER): Payer: Self-pay | Admitting: Family Medicine

## 2013-08-09 NOTE — Telephone Encounter (Signed)
Message copied by Lew Dawes on Mon Aug 09, 2013  4:43 PM  ------       Message from: Marchia Meiers       Created: Thu Aug 05, 2013  9:17 AM         Labs look good. Decrease Vit D to 1000 IU/ day as that value is a little high.

## 2013-08-09 NOTE — Telephone Encounter (Signed)
L/M to return call//jlm

## 2013-08-09 NOTE — Telephone Encounter (Signed)
Report to pt//jlm

## 2013-08-15 ENCOUNTER — Telehealth (INDEPENDENT_AMBULATORY_CARE_PROVIDER_SITE_OTHER): Payer: Self-pay | Admitting: Internal Medicine

## 2013-08-15 DIAGNOSIS — E0500 Thyrotoxicosis with diffuse goiter without thyrotoxic crisis or storm: Secondary | ICD-10-CM

## 2013-08-15 NOTE — Telephone Encounter (Signed)
Thyroid labs received from PCP and they are normal. Good news. She should have them again in June prior to appt here. Please check if I already gave her an order for that and if not, tell her we will mail one.

## 2013-08-16 NOTE — Telephone Encounter (Signed)
done

## 2013-08-16 NOTE — Telephone Encounter (Signed)
Pt aware and understanding. There is can you put an order in and I will mail it to the pt. CNR

## 2013-08-16 NOTE — Telephone Encounter (Signed)
Mailed to patient. CNR

## 2013-08-22 HISTORY — PX: CATARACT EXTRACTION W/ INTRAOCULAR LENS IMPLANT: SHX1309

## 2013-11-09 ENCOUNTER — Ambulatory Visit (INDEPENDENT_AMBULATORY_CARE_PROVIDER_SITE_OTHER): Payer: Medicare PPO | Admitting: Family Medicine

## 2013-11-23 ENCOUNTER — Ambulatory Visit (INDEPENDENT_AMBULATORY_CARE_PROVIDER_SITE_OTHER): Payer: Medicare PPO | Admitting: Family Medicine

## 2013-11-23 ENCOUNTER — Encounter (INDEPENDENT_AMBULATORY_CARE_PROVIDER_SITE_OTHER): Payer: Self-pay | Admitting: Family Medicine

## 2013-11-23 ENCOUNTER — Other Ambulatory Visit
Admission: RE | Admit: 2013-11-23 | Discharge: 2013-11-23 | Disposition: A | Discharge status: Home / Self Care | Payer: Medicare PPO | Source: Ambulatory Visit | Attending: Family Medicine | Admitting: Family Medicine

## 2013-11-23 VITALS — BP 124/75 | Temp 97.7°F | Resp 62 | Ht 66.0 in | Wt 206.0 lb

## 2013-11-23 DIAGNOSIS — I10 Essential (primary) hypertension: Secondary | ICD-10-CM

## 2013-11-23 DIAGNOSIS — G2581 Restless legs syndrome: Secondary | ICD-10-CM

## 2013-11-23 DIAGNOSIS — K219 Gastro-esophageal reflux disease without esophagitis: Secondary | ICD-10-CM

## 2013-11-23 DIAGNOSIS — E785 Hyperlipidemia, unspecified: Secondary | ICD-10-CM

## 2013-11-23 DIAGNOSIS — R7309 Other abnormal glucose: Secondary | ICD-10-CM

## 2013-11-23 DIAGNOSIS — E0500 Thyrotoxicosis with diffuse goiter without thyrotoxic crisis or storm: Secondary | ICD-10-CM

## 2013-11-23 DIAGNOSIS — R7303 Prediabetes: Secondary | ICD-10-CM

## 2013-11-23 LAB — CBC AND DIFFERENTIAL
Basophils %: 1 % (ref 0.0–3.0)
Basophils Absolute: 0.1 10*3/uL (ref 0.0–0.3)
Eosinophils %: 2.7 % (ref 0.0–7.0)
Eosinophils Absolute: 0.1 10*3/uL (ref 0.0–0.8)
Hematocrit: 41.3 % (ref 36.0–48.0)
Hemoglobin: 13.8 gm/dL (ref 12.0–16.0)
Lymphocytes Absolute: 1.8 10*3/uL (ref 0.6–5.1)
Lymphocytes: 34 % (ref 15.0–46.0)
MCH: 31 pg (ref 28–35)
MCHC: 33 gm/dL (ref 32–36)
MCV: 93 fL (ref 80–100)
MPV: 8.9 fL (ref 6.0–10.0)
Monocytes Absolute: 0.4 10*3/uL (ref 0.1–1.7)
Monocytes: 7.1 % (ref 3.0–15.0)
Neutrophils %: 55.2 % (ref 42.0–78.0)
Neutrophils Absolute: 2.9 10*3/uL (ref 1.7–8.6)
PLT CT: 293 10*3/uL (ref 130–440)
RBC: 4.47 10*6/uL (ref 3.80–5.00)
RDW: 11.7 % (ref 11.0–14.0)
WBC: 5.3 10*3/uL (ref 4.0–11.0)

## 2013-11-23 LAB — T3, FREE: T3, Free: 2.6 pg/mL (ref 1.71–3.71)

## 2013-11-23 LAB — POCT URINALYSIS DIPSTIX (10)(MULTI-TEST)
Bilirubin, UA POCT: NEGATIVE
Blood, UA POCT: NEGATIVE
Glucose, UA POCT: NEGATIVE mg/dL
Ketones, UA POCT: NEGATIVE mg/dL
Nitrite, UA POCT: NEGATIVE
POCT Leukocytes, UA: NEGATIVE
POCT Spec Gravity, UA: 1.01 (ref 1.001–1.035)
POCT pH, UA: 7 (ref 5–8)
Protein, UA POCT: NEGATIVE mg/dL
Urobilinogen, UA: 0.2 mg/dL

## 2013-11-23 LAB — COMPREHENSIVE METABOLIC PANEL
ALT: 19 U/L (ref 0–55)
AST (SGOT): 24 U/L (ref 10–42)
Albumin/Globulin Ratio: 1.32 Ratio (ref 0.70–1.50)
Albumin: 4.5 gm/dL (ref 3.5–5.0)
Alkaline Phosphatase: 71 U/L (ref 40–145)
Anion Gap: 15 mMol/L (ref 7.0–18.0)
BUN / Creatinine Ratio: 14.1 Ratio (ref 10.0–30.0)
BUN: 11 mg/dL (ref 7–22)
Bilirubin, Total: 0.8 mg/dL (ref 0.1–1.2)
CO2: 25.2 mMol/L (ref 20.0–30.0)
Calcium: 10.4 mg/dL (ref 8.5–10.5)
Chloride: 105 mMol/L (ref 98–110)
Creatinine: 0.78 mg/dL (ref 0.60–1.20)
EGFR: >60 mL/min/{1.73_m2}
Globulin: 3.4 gm/dL (ref 2.0–4.0)
Glucose: 115 mg/dL — ABNORMAL HIGH (ref 70–99)
Osmolality Calc: 282 mOsm/kg (ref 275–300)
Potassium: 4.2 mMol/L (ref 3.5–5.3)
Protein, Total: 7.9 gm/dL (ref 6.0–8.3)
Sodium: 141 mMol/L (ref 136–147)

## 2013-11-23 LAB — LIPID PANEL
Cholesterol: 198 mg/dL (ref 75–199)
Coronary Heart Disease Risk: 3.09
HDL: 64 mg/dL (ref 45–65)
LDL Calculated: 108 mg/dL
Triglycerides: 132 mg/dL (ref 10–150)
VLDL: 26 (ref 0–40)

## 2013-11-23 LAB — T4, FREE: T4 Free: 1.01 ng/dL (ref 0.70–1.48)

## 2013-11-23 LAB — VITAMIN D,25 OH,TOTAL: Vitamin D 25-Hydroxy: 53 ng/mL (ref 30–80)

## 2013-11-23 LAB — TSH: TSH: 2.07 u[IU]/mL (ref 0.40–4.20)

## 2013-11-23 NOTE — Patient Instructions (Signed)
The patient was recommended a whole food no added sugar diet along with an exercise program 4-5 days a week for 45 minutes daily to promote weight loss. They were also advised to eat 3-4 small meals a day as well as not eating late at night.

## 2013-11-23 NOTE — Progress Notes (Signed)
Subjective:       Patient ID: Brittany Berry is a 68 y.o. female.    HPIPatient presents today for medical management. She seems to be doing well. She was not able to afford the Contrave that I prescribed on her last visit. Unfortunately she's gained  another couple pounds. She does not have the self-discipline to avoid sugar and high carb meals follow a whole food diet. She also doesn't have the motivation to exercise on a regular basis. She jokingly told me that joining a gym would be a waste of time.  She is concerned about her prediabetes and her weight but not enough to take the appropriate steps so far.      The following portions of the patient's history were reviewed and updated as appropriate: allergies, current medications, past family history, past medical history, past social history, past surgical history and problem list.    Review of Systems   All other systems reviewed and are negative.            Objective:    Physical ExamPHYSICAL EXAM:    Vitals: Reviewed, see nursing note  Constitutional:  No acute distress, Non-toxic appearance.  HENT: Normocephalic, Atraumatic, Bilateral external ears normal, Oropharynx moist, No oral exudates, Nose normal.  Eyes:  PERRLA, EOMI, Conjunctiva normal, No discharge.  Neck: Normal range of motion, No tenderness, Supple, No lymphadenopathy, No stridor.  Cardiovascular:  Normal heart rate, Normal rhythm, No murmurs, No rubs, No gallops.  Pulmonary/Chest:  Normal breath sounds, No respiratory distress, No wheezing, No chest tenderness.  Abdomen:  Bowel sounds normal, Soft, No tenderness, No masses, No pulsatile masses.  Back:  No tenderness, No CVA tenderness.  Extremities:  Normal range of motion, Intact distal pulses, No edema, No tenderness.  Lymphatic:  No lymphadenopathy noted.  Neurologic:  Alert & oriented x 3, Normal motor function, Normal sensory function, No focal deficits.  Skin:  Warm, Dry, No erythema, No rash.  Psychiatric:  Affect normal, Judgment  normal, Mood normal.          Assessment:       1. Essential hypertension, benign  CBC and differential    TSH    Lipid panel    Comprehensive metabolic panel    Vitamin D,25 OH, Total    POCT UA Dipstix (10)(Multi-Test)    T3, free    T4, free   2. Dyslipidemia  CBC and differential    TSH    Lipid panel    Comprehensive metabolic panel    Vitamin D,25 OH, Total    POCT UA Dipstix (10)(Multi-Test)    T3, free    T4, free   3. Restless legs syndrome (RLS)  CBC and differential    TSH    Lipid panel    Comprehensive metabolic panel    Vitamin D,25 OH, Total    POCT UA Dipstix (10)(Multi-Test)    T3, free    T4, free   4. Graves disease  CBC and differential    TSH    Lipid panel    Comprehensive metabolic panel    Vitamin D,25 OH, Total    POCT UA Dipstix (10)(Multi-Test)    T3, free    T4, free   5. GERD (gastroesophageal reflux disease)  CBC and differential    TSH    Lipid panel    Comprehensive metabolic panel    Vitamin D,25 OH, Total    POCT UA Dipstix (10)(Multi-Test)    T3, free  T4, free   6. Pre-diabetes  Hemoglobin A1C         Plan:       The patient was recommended a whole food no added sugar diet along with an exercise program 4-5 days a week for 45 minutes daily to promote weight loss. They were also advised to eat 3-4 small meals a day as well as not eating late at night.  Continue current medical therapy. Return in 3 months.

## 2013-11-24 LAB — HEMOGLOBIN A1C: Hgb A1C, %: 5.7 %

## 2013-12-23 ENCOUNTER — Encounter (INDEPENDENT_AMBULATORY_CARE_PROVIDER_SITE_OTHER): Payer: Self-pay | Admitting: Internal Medicine

## 2013-12-23 ENCOUNTER — Ambulatory Visit (INDEPENDENT_AMBULATORY_CARE_PROVIDER_SITE_OTHER): Payer: Medicare PPO | Admitting: Internal Medicine

## 2013-12-23 VITALS — BP 108/66 | HR 60 | Ht 65.35 in | Wt 198.0 lb

## 2013-12-23 DIAGNOSIS — E0500 Thyrotoxicosis with diffuse goiter without thyrotoxic crisis or storm: Secondary | ICD-10-CM

## 2013-12-23 DIAGNOSIS — I10 Essential (primary) hypertension: Secondary | ICD-10-CM

## 2013-12-23 NOTE — Patient Instructions (Signed)
Below is a summary of information and instructions we reviewed at today's appointment. Please review this information carefully and call if you have any questions regarding it.     Thyroid: Based on your lab results, your Graves' Disease appears to be in remission. This may last weeks, months, or indefinitely. I will recommend that Dr. Hyacinth Meeker check thyroid labs again 6months and then annually. If you have a recurrence of hyperthyroid symptoms, such as weight loss, heart racing, feeling hot, nervousness, or trouble sleeping, please see Dr. Hyacinth Meeker for labs and he can refer you back if it is the thyroid.  Low normal Blood Pressure: monitor your BP at home. Increase salt intake for a few days since you have had diarrhea. If you don't feel better or BP stays low, contact Dr. Hyacinth Meeker.

## 2013-12-23 NOTE — Progress Notes (Signed)
Provider Progress Note      Patient Name:  Brittany Berry [[id number]] DOB: April 06, 1946  Date: 12/23/2013      Subjective:         Brittany Berry is a 68 y.o. female seen for followup of Graves' disease. Since last visit she has had some upset stomach in the past few days and also a sore muscle in her neck and wonders if this is thyroid-related.  The patient notes no pain in the thyroid, enlargement of the thyroid, hoarseness or dysphagia. There have been no symptoms of hyperthyroidism such as heat intolerance, tremors, nervousness, palpitations or sleep disturbance. There have been no symptoms of hypothyroidism such as constipation, cold intolerance, lethargy or dry skin other than some cold intolerance and chills lately..      Review of Systems  As above.   The following portions of the patient's history were reviewed and updated as appropriate: allergies, current medications, past family history, past medical history, past social history, past surgical history and problem list.    Medications  Current Outpatient Prescriptions   Medication Sig Dispense Refill   . amitriptyline (ELAVIL) 10 MG tablet Take 10 mg by mouth nightly as needed.       Marland Kitchen amLODIPine-benazepril (LOTREL 5-20) 5-20 MG per capsule Take 1 capsule by mouth daily.  90 capsule  4   . aspirin 81 MG tablet Take 81 mg by mouth daily.       Marland Kitchen atorvastatin (LIPITOR) 20 MG tablet Take 1 tablet (20 mg total) by mouth daily.  90 tablet  4   . b complex vitamins capsule Take 1 capsule by mouth daily.       . bimatoprost (LUMIGAN) 0.03 % ophthalmic drops Place 1 drop into both eyes nightly.       . brimonidine (ALPHAGAN) 0.2 % ophthalmic solution Place 1 drop into both eyes 2 (two) times daily.       . Calcium Carbonate-Vitamin D (CALTRATE 600+D PO) Take 1 tablet by mouth daily.       . Cetirizine HCl (ZYRTEC PO) Take 10 mg by mouth as needed.        . Cholecalciferol (VITAMIN D) 1000 UNIT tablet Take 1,000 Units by mouth daily.       Marland Kitchen CO-ENZYME Q-10 PO  Take 300 mg by mouth daily.       . dorzolamide-timolol (COSOPT) 22.3-6.8 MG/ML ophthalmic solution Apply 1 drop to eye 2 (two) times daily.       Marland Kitchen FLUoxetine (PROZAC) 10 MG tablet Take 10 mg by mouth daily.       . mometasone (NASONEX) 50 MCG/ACT nasal spray 2 sprays by Nasal route as needed.        . Multiple Vitamin (MULTIVITAMIN) tablet Take 1 tablet by mouth daily.       . nabumetone (RELAFEN) 500 MG tablet Take 500 mg by mouth 2 (two) times daily as needed.          . Omega-3 Fatty Acids (FISH OIL BURP-LESS) 1000 MG Cap Take 1 capsule by mouth daily.       Marland Kitchen omeprazole (PRILOSEC) 20 MG capsule Take 20 mg by mouth daily.       . vitamin E 400 UNIT capsule Take 400 Units by mouth daily.         No current facility-administered medications for this visit.      Medication review with [facility] suggested compliance all of the time.       Objective:  Vital Signs: BP 108/66   Pulse 60   Ht 1.66 m (5' 5.35")   Wt 89.812 kg (198 lb)   BMI 32.59 kg/m2     Body mass index is 32.59 kg/(m^2).  PE  Well nourished, well appearing, in no acute distress.  HEENT: There is no stare or lid lag. There is no periorbital edema. Extraoccular movements are intact. No conjunctival injection.   Neck is supple without adenopathy.  The thyroid is normal in size and consistency and no nodules are appreciated.  Lungs are clear to auscultation.  Cardiac exam reveals a regular rate and rhythm. No murmur or gallop is appreciated.   Exam of the extremities reveals no peripheral edema.      On neurologic exam, the patient is alert and appropriate. Gait and speech are normal.There is no tremor of the outstretched hands. Reflexes are 1+ and symmetrical with  normal relaxation phases.   Skin is cool and dry.      Lab and Imaging Review  No visits with results within 14 Day(s) from this visit.  Latest known visit with results is:    Hospital Outpatient Visit on 11/23/2013   Component Date Value Ref Range Status   . WBC 11/23/2013 5.3   4.0 - 11.0 K/cmm Final   . RBC 11/23/2013 4.47  3.80 - 5.00 M/cmm Final   . Hemoglobin 11/23/2013 13.8  12.0 - 16.0 gm/dL Final   . Hematocrit 08/65/7846 41.3  36.0 - 48.0 % Final   . MCV 11/23/2013 93  80 - 100 fL Final   . MCH 11/23/2013 31  28 - 35 pg Final   . MCHC 11/23/2013 33  32 - 36 gm/dL Final   . RDW 96/29/5284 11.7  11.0 - 14.0 % Final   . PLT CT 11/23/2013 293  130 - 440 K/cmm Final   . MPV 11/23/2013 8.9  6.0 - 10.0 fL Final   . NEUTROPHIL % 11/23/2013 55.2  42.0 - 78.0 % Final   . Lymphocytes 11/23/2013 34.0  15.0 - 46.0 % Final   . Monocytes 11/23/2013 7.1  3.0 - 15.0 % Final   . Eosinophils % 11/23/2013 2.7  0.0 - 7.0 % Final   . Basophils % 11/23/2013 1.0  0.0 - 3.0 % Final   . Neutrophils Absolute 11/23/2013 2.9  1.7 - 8.6 K/cmm Final   . Lymphocytes Absolute 11/23/2013 1.8  0.6 - 5.1 K/cmm Final   . Monocytes Absolute 11/23/2013 0.4  0.1 - 1.7 K/cmm Final   . Eosinophils Absolute 11/23/2013 0.1  0.0 - 0.8 K/cmm Final   . BASO Absolute 11/23/2013 0.1  0.0 - 0.3 K/cmm Final   . Thyroid Stimulating Hormone 11/23/2013 2.07  0.40 - 4.20 uIU/mL Final   . Cholesterol 11/23/2013 198  75 - 199 mg/dL Final   . Triglycerides 11/23/2013 132  10 - 150 mg/dL Final   . HDL 13/24/4010 64  45 - 65 mg/dL Final   . LDL Calculated 11/23/2013 272   Final   . Coronary Heart Disease Risk 11/23/2013 3.09   Final   . VLDL 11/23/2013 26  0 - 40 Final   . Sodium 11/23/2013 141  136 - 147 mMol/L Final   . Potassium 11/23/2013 4.2  3.5 - 5.3 mMol/L Final   . Chloride 11/23/2013 105  98 - 110 mMol/L Final   . CO2 11/23/2013 25.2  20.0 - 30.0 mMol/L Final   . CALCIUM 11/23/2013 10.4  8.5 - 10.5 mg/dL Final   .  Glucose 11/23/2013 115* 70 - 99 mg/dL Final   . Creatinine 22/08/5425 0.78  0.60 - 1.20 mg/dL Final   . BUN 01/12/7627 11  7 - 22 mg/dL Final   . Protein, Total 11/23/2013 7.9  6.0 - 8.3 gm/dL Final   . Albumin 31/51/7616 4.5  3.5 - 5.0 gm/dL Final   . Alkaline Phosphatase 11/23/2013 71  40 - 145 U/L Final   . ALT  11/23/2013 19  0 - 55 U/L Final   . AST (SGOT) 11/23/2013 24  10 - 42 U/L Final   . Bilirubin, Total 11/23/2013 0.8  0.1 - 1.2 mg/dL Final   . Albumin/Globulin Ratio 11/23/2013 1.32  0.70 - 1.50 Ratio Final   . Anion Gap 11/23/2013 15.0  7.0 - 18.0 mMol/L Final   . BUN/Creatinine Ratio 11/23/2013 14.1  10.0 - 30.0 Ratio Final   . EGFR 11/23/2013 >60   Final   . Osmolality Calc 11/23/2013 282  275 - 300 mOsm/kg Final   . Globulin 11/23/2013 3.4  2.0 - 4.0 gm/dL Final   . Vit D, [id number]-[id number] 11/23/2013 53  30 - 80 ng/mL Final   . T3, Free 11/23/2013 2.60  1.71 - 3.71 pg/mL Final   . T4 Free 11/23/2013 1.01  0.70 - 1.48 ng/dL Final   . Hgb R4W, % 54/62/7035 5.7   Final          Assessment:     1. Graves disease    2. Essential hypertension, benign    We have reviewed that based on the current lab results, Graves' Disease appears to be in remission. We have reviewed that this may last for weeks, months or could be indefinite. We have reviewed the symptoms of hyperthyroidism and I recommended repeat lab testing promptly if these symptoms recur. Otherwise, I have recommended lab testing in 6 months.  If these are normal, would check annually.  Discussed low normal BP and possible viral symptoms as below.           Plan:   See plan outlined in patient instructions below.    Patient Instructions   Below is a summary of information and instructions we reviewed at today's appointment. Please review this information carefully and call if you have any questions regarding it.     Thyroid: Based on your lab results, your Graves' Disease appears to be in remission. This may last weeks, months, or indefinitely. I will recommend that Dr. Hyacinth Meeker check thyroid labs again 6months and then annually. If you have a recurrence of hyperthyroid symptoms, such as weight loss, heart racing, feeling hot, nervousness, or trouble sleeping, please see Dr. Hyacinth Meeker for labs and he can refer you back if it is the thyroid.  Low normal Blood Pressure:  monitor your BP at home. Increase salt intake for a few days since you have had diarrhea. If you don't feel better or BP stays low, contact Dr. Hyacinth Meeker.             Return if symptoms worsen or fail to improve.    Vela Prose, MD

## 2014-02-08 ENCOUNTER — Ambulatory Visit (INDEPENDENT_AMBULATORY_CARE_PROVIDER_SITE_OTHER): Payer: Auto Insurance (includes no fault) | Admitting: Family Medicine

## 2014-02-08 ENCOUNTER — Encounter (INDEPENDENT_AMBULATORY_CARE_PROVIDER_SITE_OTHER): Payer: Self-pay | Admitting: Family Medicine

## 2014-02-08 VITALS — BP 130/90 | HR 79 | Temp 98.0°F | Resp 18 | Wt 200.4 lb

## 2014-02-08 DIAGNOSIS — S8012XA Contusion of left lower leg, initial encounter: Secondary | ICD-10-CM

## 2014-02-08 DIAGNOSIS — S8010XA Contusion of unspecified lower leg, initial encounter: Secondary | ICD-10-CM

## 2014-02-08 MED ORDER — NABUMETONE 500 MG PO TABS
1000.0000 mg | ORAL_TABLET | Freq: Every day | ORAL | Status: DC
Start: 2014-02-08 — End: 2015-05-08

## 2014-02-08 NOTE — Patient Instructions (Signed)
Schedule venous Doppler ultrasound performed patient [facility]. Stay off the leg as much as possible and apply warm compresses 20 minutes 4 times daily to the firm hematoma of the left lower extremity. Start taking Relafen (nabumetone) 500 mg, 2 pills by mouth daily with food. This will help with soreness and discomfort of the leg.
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
## 2014-02-09 ENCOUNTER — Encounter (INDEPENDENT_AMBULATORY_CARE_PROVIDER_SITE_OTHER): Payer: Self-pay | Admitting: Family Medicine

## 2014-02-09 ENCOUNTER — Ambulatory Visit
Admission: RE | Admit: 2014-02-09 | Discharge: 2014-02-09 | Disposition: A | Discharge status: Home / Self Care | Payer: Auto Insurance (includes no fault) | Source: Ambulatory Visit | Attending: Family Medicine | Admitting: Family Medicine

## 2014-02-09 DIAGNOSIS — X58XXXA Exposure to other specified factors, initial encounter: Secondary | ICD-10-CM | POA: Insufficient documentation

## 2014-02-09 DIAGNOSIS — S8010XA Contusion of unspecified lower leg, initial encounter: Secondary | ICD-10-CM | POA: Insufficient documentation

## 2014-02-09 DIAGNOSIS — S8012XA Contusion of left lower leg, initial encounter: Secondary | ICD-10-CM

## 2014-02-20 ENCOUNTER — Other Ambulatory Visit (INDEPENDENT_AMBULATORY_CARE_PROVIDER_SITE_OTHER): Payer: Self-pay | Admitting: Family Medicine

## 2014-02-23 ENCOUNTER — Encounter (INDEPENDENT_AMBULATORY_CARE_PROVIDER_SITE_OTHER): Payer: Self-pay | Admitting: Family Medicine

## 2014-02-23 ENCOUNTER — Ambulatory Visit (INDEPENDENT_AMBULATORY_CARE_PROVIDER_SITE_OTHER): Payer: Medicare PPO | Admitting: Family Medicine

## 2014-02-23 ENCOUNTER — Other Ambulatory Visit
Admission: RE | Admit: 2014-02-23 | Discharge: 2014-02-23 | Disposition: A | Discharge status: Home / Self Care | Payer: Medicare PPO | Source: Ambulatory Visit | Attending: Family Medicine | Admitting: Family Medicine

## 2014-02-23 VITALS — BP 119/85 | HR 59 | Temp 98.8°F | Wt 203.4 lb

## 2014-02-23 DIAGNOSIS — S8012XD Contusion of left lower leg, subsequent encounter: Secondary | ICD-10-CM

## 2014-02-23 DIAGNOSIS — R7303 Prediabetes: Secondary | ICD-10-CM

## 2014-02-23 DIAGNOSIS — G2581 Restless legs syndrome: Secondary | ICD-10-CM

## 2014-02-23 DIAGNOSIS — K219 Gastro-esophageal reflux disease without esophagitis: Secondary | ICD-10-CM

## 2014-02-23 DIAGNOSIS — R7309 Other abnormal glucose: Secondary | ICD-10-CM

## 2014-02-23 DIAGNOSIS — E785 Hyperlipidemia, unspecified: Secondary | ICD-10-CM

## 2014-02-23 DIAGNOSIS — M7981 Nontraumatic hematoma of soft tissue: Secondary | ICD-10-CM

## 2014-02-23 DIAGNOSIS — M159 Polyosteoarthritis, unspecified: Secondary | ICD-10-CM

## 2014-02-23 DIAGNOSIS — E0500 Thyrotoxicosis with diffuse goiter without thyrotoxic crisis or storm: Secondary | ICD-10-CM

## 2014-02-23 DIAGNOSIS — I10 Essential (primary) hypertension: Secondary | ICD-10-CM

## 2014-02-23 DIAGNOSIS — M150 Primary generalized (osteo)arthritis: Secondary | ICD-10-CM

## 2014-02-23 DIAGNOSIS — Z5189 Encounter for other specified aftercare: Secondary | ICD-10-CM

## 2014-02-23 HISTORY — DX: Prediabetes: R73.03

## 2014-02-23 LAB — T3, FREE: T3, Free: 2.7 pg/mL (ref 1.71–3.71)

## 2014-02-23 LAB — T4, FREE: T4 Free: 0.98 ng/dL (ref 0.70–1.48)

## 2014-02-23 LAB — CBC AND DIFFERENTIAL
Basophils %: 1.4 % (ref 0.0–3.0)
Basophils Absolute: 0.1 10*3/uL (ref 0.0–0.3)
Eosinophils %: 3.9 % (ref 0.0–7.0)
Eosinophils Absolute: 0.2 10*3/uL (ref 0.0–0.8)
Hematocrit: 42 % (ref 36.0–48.0)
Hemoglobin: 14.3 gm/dL (ref 12.0–16.0)
Lymphocytes Absolute: 1.9 10*3/uL (ref 0.6–5.1)
Lymphocytes: 34.7 % (ref 15.0–46.0)
MCH: 32 pg (ref 28–35)
MCHC: 34 gm/dL (ref 32–36)
MCV: 93 fL (ref 80–100)
MPV: 9 fL (ref 6.0–10.0)
Monocytes Absolute: 0.4 10*3/uL (ref 0.1–1.7)
Monocytes: 7.9 % (ref 3.0–15.0)
Neutrophils %: 52.1 % (ref 42.0–78.0)
Neutrophils Absolute: 2.8 10*3/uL (ref 1.7–8.6)
PLT CT: 286 10*3/uL (ref 130–440)
RBC: 4.52 10*6/uL (ref 3.80–5.00)
RDW: 11.5 % (ref 11.0–14.0)
WBC: 5.4 10*3/uL (ref 4.0–11.0)

## 2014-02-23 LAB — COMPREHENSIVE METABOLIC PANEL
ALT: 20 U/L (ref 0–55)
AST (SGOT): 23 U/L (ref 10–42)
Albumin/Globulin Ratio: 1.11 Ratio (ref 0.70–1.50)
Albumin: 4.2 gm/dL (ref 3.5–5.0)
Alkaline Phosphatase: 70 U/L (ref 40–145)
Anion Gap: 12.7 mMol/L (ref 7.0–18.0)
BUN / Creatinine Ratio: 14.5 Ratio (ref 10.0–30.0)
BUN: 11 mg/dL (ref 7–22)
Bilirubin, Total: 0.7 mg/dL (ref 0.1–1.2)
CO2: 25.6 mMol/L (ref 20.0–30.0)
Calcium: 10 mg/dL (ref 8.5–10.5)
Chloride: 105 mMol/L (ref 98–110)
Creatinine: 0.76 mg/dL (ref 0.60–1.20)
EGFR: >60 mL/min/{1.73_m2}
Globulin: 3.8 gm/dL (ref 2.0–4.0)
Glucose: 106 mg/dL — ABNORMAL HIGH (ref 70–99)
Osmolality Calc: 277 mOsm/kg (ref 275–300)
Potassium: 4.3 mMol/L (ref 3.5–5.3)
Protein, Total: 8 gm/dL (ref 6.0–8.3)
Sodium: 139 mMol/L (ref 136–147)

## 2014-02-23 LAB — LIPID PANEL
Cholesterol: 224 mg/dL — ABNORMAL HIGH (ref 75–199)
Coronary Heart Disease Risk: 3.61
HDL: 62 mg/dL (ref 45–65)
LDL Calculated: 131 mg/dL
Triglycerides: 156 mg/dL — ABNORMAL HIGH (ref 10–150)
VLDL: 31 (ref 0–40)

## 2014-02-23 LAB — VITAMIN D,25 OH,TOTAL: Vitamin D 25-Hydroxy: 46 ng/mL (ref 30–80)

## 2014-02-23 LAB — TSH: TSH: 1.73 u[IU]/mL (ref 0.40–4.20)
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
## 2014-02-24 ENCOUNTER — Encounter (INDEPENDENT_AMBULATORY_CARE_PROVIDER_SITE_OTHER): Payer: Self-pay | Admitting: Family Medicine

## 2014-02-24 LAB — HEMOGLOBIN A1C: Hgb A1C, %: 5.7 %

## 2014-04-02 ENCOUNTER — Other Ambulatory Visit (RURAL_HEALTH_CENTER): Payer: Self-pay | Admitting: Family Medicine

## 2014-04-29 ENCOUNTER — Encounter (INDEPENDENT_AMBULATORY_CARE_PROVIDER_SITE_OTHER): Payer: Self-pay

## 2014-04-29 NOTE — Progress Notes (Signed)
Flu shot at [facility]

## 2014-05-16 ENCOUNTER — Encounter (INDEPENDENT_AMBULATORY_CARE_PROVIDER_SITE_OTHER): Payer: Self-pay

## 2014-06-14 ENCOUNTER — Encounter (RURAL_HEALTH_CENTER): Payer: Self-pay

## 2014-06-29 ENCOUNTER — Encounter (RURAL_HEALTH_CENTER): Payer: Self-pay | Admitting: Family Medicine

## 2014-06-29 ENCOUNTER — Ambulatory Visit (RURAL_HEALTH_CENTER): Payer: Medicare PPO | Admitting: Family Medicine

## 2014-06-29 ENCOUNTER — Other Ambulatory Visit
Admission: RE | Admit: 2014-06-29 | Discharge: 2014-06-29 | Disposition: A | Discharge status: Home / Self Care | Payer: Medicare PPO | Source: Ambulatory Visit | Attending: Family Medicine | Admitting: Family Medicine

## 2014-06-29 VITALS — BP 110/77 | HR 68 | Temp 98.6°F | Resp 18 | Ht 66.0 in | Wt 203.2 lb

## 2014-06-29 DIAGNOSIS — M159 Polyosteoarthritis, unspecified: Secondary | ICD-10-CM

## 2014-06-29 DIAGNOSIS — R7303 Prediabetes: Secondary | ICD-10-CM

## 2014-06-29 DIAGNOSIS — B351 Tinea unguium: Secondary | ICD-10-CM

## 2014-06-29 DIAGNOSIS — R7309 Other abnormal glucose: Secondary | ICD-10-CM

## 2014-06-29 DIAGNOSIS — E0500 Thyrotoxicosis with diffuse goiter without thyrotoxic crisis or storm: Secondary | ICD-10-CM

## 2014-06-29 DIAGNOSIS — E785 Hyperlipidemia, unspecified: Secondary | ICD-10-CM

## 2014-06-29 DIAGNOSIS — I10 Essential (primary) hypertension: Secondary | ICD-10-CM

## 2014-06-29 DIAGNOSIS — M150 Primary generalized (osteo)arthritis: Secondary | ICD-10-CM

## 2014-06-29 LAB — CBC AND DIFFERENTIAL
Basophils %: 1.4 % (ref 0.0–3.0)
Basophils Absolute: 0.1 10*3/uL (ref 0.0–0.3)
Eosinophils %: 3 % (ref 0.0–7.0)
Eosinophils Absolute: 0.1 10*3/uL (ref 0.0–0.8)
Hematocrit: 41.4 % (ref 36.0–48.0)
Hemoglobin: 13.5 gm/dL (ref 12.0–16.0)
Lymphocytes Absolute: 1.7 10*3/uL (ref 0.6–5.1)
Lymphocytes: 34.9 % (ref 15.0–46.0)
MCH: 31 pg (ref 28–35)
MCHC: 33 gm/dL (ref 32–36)
MCV: 94 fL (ref 80–100)
MPV: 9.3 fL (ref 6.0–10.0)
Monocytes Absolute: 0.3 10*3/uL (ref 0.1–1.7)
Monocytes: 7.3 % (ref 3.0–15.0)
Neutrophils %: 53.5 % (ref 42.0–78.0)
Neutrophils Absolute: 2.5 10*3/uL (ref 1.7–8.6)
PLT CT: 285 10*3/uL (ref 130–440)
RBC: 4.43 10*6/uL (ref 3.80–5.00)
RDW: 11.6 % (ref 11.0–14.0)
WBC: 4.7 10*3/uL (ref 4.0–11.0)

## 2014-06-29 LAB — COMPREHENSIVE METABOLIC PANEL
ALT: 19 U/L (ref 0–55)
AST (SGOT): 22 U/L (ref 10–42)
Albumin/Globulin Ratio: 1.27 Ratio (ref 0.70–1.50)
Albumin: 4.2 gm/dL (ref 3.5–5.0)
Alkaline Phosphatase: 69 U/L (ref 40–145)
Anion Gap: 10.5 mMol/L (ref 7.0–18.0)
BUN / Creatinine Ratio: 13.9 Ratio (ref 10.0–30.0)
BUN: 11 mg/dL (ref 7–22)
Bilirubin, Total: 0.6 mg/dL (ref 0.1–1.2)
CO2: 26.8 mMol/L (ref 20.0–30.0)
Calcium: 9.8 mg/dL (ref 8.5–10.5)
Chloride: 106 mMol/L (ref 98–110)
Creatinine: 0.79 mg/dL (ref 0.60–1.20)
EGFR: >60 mL/min/{1.73_m2}
Globulin: 3.3 gm/dL (ref 2.0–4.0)
Glucose: 109 mg/dL — ABNORMAL HIGH (ref 70–99)
Osmolality Calc: 278 mOsm/kg (ref 275–300)
Potassium: 4.3 mMol/L (ref 3.5–5.3)
Protein, Total: 7.5 gm/dL (ref 6.0–8.3)
Sodium: 139 mMol/L (ref 136–147)

## 2014-06-29 LAB — LIPID PANEL
Cholesterol: 190 mg/dL (ref 75–199)
Coronary Heart Disease Risk: 3.06
HDL: 62 mg/dL (ref 45–65)
LDL Calculated: 111 mg/dL
Triglycerides: 86 mg/dL (ref 10–150)
VLDL: 17 (ref 0–40)

## 2014-06-29 LAB — HEMOGLOBIN A1C: Hgb A1C, %: 5.3 %

## 2014-06-29 LAB — TSH: TSH: 1.91 u[IU]/mL (ref 0.40–4.20)

## 2014-06-29 LAB — T3, FREE: T3, Free: 2.7 pg/mL (ref 1.71–3.71)

## 2014-06-29 LAB — T4, FREE: T4 Free: 0.94 ng/dL (ref 0.70–1.48)

## 2014-06-29 MED ORDER — TERBINAFINE HCL 250 MG PO TABS
250.0000 mg | ORAL_TABLET | Freq: Every day | ORAL | Status: DC
Start: 2014-06-29 — End: 2015-02-01

## 2014-06-29 MED ORDER — IPRATROPIUM BROMIDE 0.06 % NA SOLN
2.0000 | Freq: Three times a day (TID) | NASAL | Status: DC | PRN
Start: 2014-06-29 — End: 2014-11-07

## 2014-06-29 NOTE — Progress Notes (Signed)
Subjective:    Patient ID: Brittany Berry is a 68 y.o. female.    HPI Brittany Berry returns today as scheduled. She's here for medical management. Her medical problem list includes hypertension restless leg syndrome prediabetes dyslipidemia history of Graves' disease osteoarthritis acid reflux osteopenia and chronic discomfort in her occipital region of her head. She also has a history of anxiety and some depression but seems to be doing well on Prozac 10 mg a day. She informs me that she is started joining a gym and going 3 days a week to exercise. She can meet with an assessor at the gym who is going to help her with her diet and to lose weight. She sleeping well. She gets some unusual discomfort in the occipital region of her scalp about once a week now. It does seem to be improving and lessening over time. GERD symptoms are under control. She continues to have some hormonal deficiency symptoms of hot flashes but no palpitations. When last seen she had a small hematoma of her left calf that seems to have resolved and not bothering her any longer. She does mention that she's gotten a fungal infection in her right great toenail. She's been using an over-the-counter topical agent.    The following portions of the patient's history were reviewed and updated as appropriate: allergies, current medications, past family history, past medical history, past social history, past surgical history and problem list.    Review of Systems   All other systems reviewed and are negative.          Objective:    Physical Exam   Constitutional: She is oriented to person, place, and time. She appears well-developed. No distress.   Pleasant white female in no apparent distress. Mod obese     HENT:   Head: Normocephalic and atraumatic.   Right Ear: External ear normal.   Left Ear: External ear normal.   Nose: Nose normal.   Mouth/Throat: Oropharynx is clear and moist. No oropharyngeal exudate.   Eyes: Conjunctivae and EOM are normal. Pupils  are equal, round, and reactive to light. Right eye exhibits no discharge. Left eye exhibits no discharge. No scleral icterus.   Neck: Normal range of motion. Neck supple. No JVD present. No tracheal deviation present. No thyromegaly present.   Cardiovascular: Normal rate, regular rhythm, normal heart sounds and intact distal pulses.  Exam reveals no gallop and no friction rub.    No murmur heard.  Pulmonary/Chest: Effort normal and breath sounds normal. No stridor. No respiratory distress. She has no wheezes. She has no rales. She exhibits no tenderness.   Abdominal: Soft. Bowel sounds are normal. She exhibits no distension and no mass. There is no tenderness. There is no rebound and no guarding.   Musculoskeletal: Normal range of motion. She exhibits no edema or tenderness.   Lymphadenopathy:     She has no cervical adenopathy.   Neurological: She is alert and oriented to person, place, and time. No cranial nerve deficit. Coordination normal.   Skin: Skin is warm and dry. No rash noted. She is not diaphoretic. No erythema. No pallor.   Right great toenail was thickened hypertrophic and discolored consistent with onychomycosis. This involves about one half of the distal nail.   Psychiatric: She has a normal mood and affect. Her behavior is normal. Judgment and thought content normal.   Nursing note and vitals reviewed.          Assessment:       1.  Pre-diabetes    2. Primary osteoarthritis involving multiple joints    3. Essential hypertension, benign    4. Dyslipidemia    5. Graves disease    6. Onychomycosis          Plan:       1. Lamisil 250 mg by mouth daily for 3 months.  2. Fasting labs today.  3. Refill on Atrovent nasal spray  4.The patient was recommended a whole food no added sugar diet along with an exercise program 4-5 days a week for 45 minutes daily to promote weight loss. They were also advised to eat 3-4 small meals a day as well as not eating late at night.  5. Follow-up in 4 months

## 2014-08-09 ENCOUNTER — Other Ambulatory Visit (RURAL_HEALTH_CENTER): Payer: Self-pay | Admitting: Family Medicine

## 2014-11-07 ENCOUNTER — Ambulatory Visit (RURAL_HEALTH_CENTER): Payer: Medicare PPO | Admitting: Family Medicine

## 2014-11-07 ENCOUNTER — Other Ambulatory Visit
Admission: RE | Admit: 2014-11-07 | Discharge: 2014-11-07 | Disposition: A | Discharge status: Home / Self Care | Payer: Medicare PPO | Source: Ambulatory Visit | Attending: Family Medicine | Admitting: Family Medicine

## 2014-11-07 ENCOUNTER — Encounter (RURAL_HEALTH_CENTER): Payer: Self-pay | Admitting: Family Medicine

## 2014-11-07 VITALS — BP 142/74 | HR 67 | Temp 98.6°F | Resp 18 | Ht 66.0 in | Wt 205.8 lb

## 2014-11-07 DIAGNOSIS — I10 Essential (primary) hypertension: Secondary | ICD-10-CM

## 2014-11-07 DIAGNOSIS — R7303 Prediabetes: Secondary | ICD-10-CM

## 2014-11-07 DIAGNOSIS — E785 Hyperlipidemia, unspecified: Secondary | ICD-10-CM

## 2014-11-07 DIAGNOSIS — R350 Frequency of micturition: Secondary | ICD-10-CM

## 2014-11-07 DIAGNOSIS — E0500 Thyrotoxicosis with diffuse goiter without thyrotoxic crisis or storm: Secondary | ICD-10-CM

## 2014-11-07 DIAGNOSIS — R7309 Other abnormal glucose: Secondary | ICD-10-CM

## 2014-11-07 LAB — CBC AND DIFFERENTIAL
Basophils %: 0.9 % (ref 0.0–3.0)
Basophils Absolute: 0 10*3/uL (ref 0.0–0.3)
Eosinophils %: 3.3 % (ref 0.0–7.0)
Eosinophils Absolute: 0.2 10*3/uL (ref 0.0–0.8)
Hematocrit: 42 % (ref 36.0–48.0)
Hemoglobin: 13.8 gm/dL (ref 12.0–16.0)
Lymphocytes Absolute: 1.7 10*3/uL (ref 0.6–5.1)
Lymphocytes: 36.5 % (ref 15.0–46.0)
MCH: 31 pg (ref 28–35)
MCHC: 33 gm/dL (ref 32–36)
MCV: 95 fL (ref 80–100)
MPV: 8.7 fL (ref 6.0–10.0)
Monocytes Absolute: 0.4 10*3/uL (ref 0.1–1.7)
Monocytes: 8.3 % (ref 3.0–15.0)
Neutrophils %: 51.1 % (ref 42.0–78.0)
Neutrophils Absolute: 2.4 10*3/uL (ref 1.7–8.6)
PLT CT: 333 10*3/uL (ref 130–440)
RBC: 4.43 10*6/uL (ref 3.80–5.00)
RDW: 11.9 % (ref 11.0–14.0)
WBC: 4.6 10*3/uL (ref 4.0–11.0)

## 2014-11-07 LAB — T3, FREE: T3, Free: 2.9 pg/mL (ref 1.71–3.71)

## 2014-11-07 LAB — COMPREHENSIVE METABOLIC PANEL
ALT: 19 U/L (ref 0–55)
AST (SGOT): 21 U/L (ref 10–42)
Albumin/Globulin Ratio: 1.27 Ratio (ref 0.70–1.50)
Albumin: 4.2 gm/dL (ref 3.5–5.0)
Alkaline Phosphatase: 73 U/L (ref 40–145)
Anion Gap: 12.9 mMol/L (ref 7.0–18.0)
BUN / Creatinine Ratio: 15 Ratio (ref 10.0–30.0)
BUN: 12 mg/dL (ref 7–22)
Bilirubin, Total: 0.7 mg/dL (ref 0.1–1.2)
CO2: 25.5 mMol/L (ref 20.0–30.0)
Calcium: 9.8 mg/dL (ref 8.5–10.5)
Chloride: 104 mMol/L (ref 98–110)
Creatinine: 0.8 mg/dL (ref 0.60–1.20)
EGFR: >60 mL/min/{1.73_m2}
Globulin: 3.3 gm/dL (ref 2.0–4.0)
Glucose: 116 mg/dL — ABNORMAL HIGH (ref 70–99)
Osmolality Calc: 276 mOsm/kg (ref 275–300)
Potassium: 4.4 mMol/L (ref 3.5–5.3)
Protein, Total: 7.5 gm/dL (ref 6.0–8.3)
Sodium: 138 mMol/L (ref 136–147)

## 2014-11-07 LAB — POCT URINALYSIS DIPSTIX (10)(MULTI-TEST)
Bilirubin, UA POCT: NEGATIVE
Glucose, UA POCT: NEGATIVE mg/dL
Ketones, UA POCT: NEGATIVE mg/dL
Nitrite, UA POCT: NEGATIVE
POCT Spec Gravity, UA: 1.005 (ref 1.001–1.035)
POCT pH, UA: 7.5 (ref 5–8)
Protein, UA POCT: NEGATIVE mg/dL
Urobilinogen, UA: 0.2 mg/dL

## 2014-11-07 LAB — HEMOGLOBIN A1C: Hgb A1C, %: 5.3 %

## 2014-11-07 LAB — T4, FREE: T4 Free: 0.95 ng/dL (ref 0.70–1.48)

## 2014-11-07 LAB — LIPID PANEL
Cholesterol: 202 mg/dL — ABNORMAL HIGH (ref 75–199)
Coronary Heart Disease Risk: 3.42
HDL: 59 mg/dL (ref 45–65)
LDL Calculated: 121 mg/dL
Triglycerides: 112 mg/dL (ref 10–150)
VLDL: 22 (ref 0–40)

## 2014-11-07 LAB — TSH: TSH: 1.86 u[IU]/mL (ref 0.40–4.20)

## 2014-11-07 MED ORDER — AMITRIPTYLINE HCL 10 MG PO TABS
10.0000 mg | ORAL_TABLET | Freq: Every evening | ORAL | Status: DC | PRN
Start: 2014-11-07 — End: 2017-05-26

## 2014-11-07 MED ORDER — IPRATROPIUM BROMIDE 0.06 % NA SOLN
2.0000 | Freq: Three times a day (TID) | NASAL | Status: AC | PRN
Start: 2014-11-07 — End: ?

## 2014-11-07 MED ORDER — AMLODIPINE BESY-BENAZEPRIL HCL 5-20 MG PO CAPS
1.0000 | ORAL_CAPSULE | Freq: Every day | ORAL | Status: DC
Start: 2014-11-07 — End: 2016-02-13

## 2014-11-07 NOTE — Progress Notes (Signed)
Subjective:    Patient ID: Brittany Berry is a 69 y.o. female.    HPI Brittany Berry returns today for medical management. She is a delightful 69 year old white female who has a history of hypertension, dyslipidemia, Graves' disease and prediabetes due to exogenous obesity. She is not on any thyroid replacement medication. She also has some osteoarthritis and osteopenia as well as restless leg syndrome and chronic headaches. She is taking amitriptyline 10 mg occasionally at bedtime. Her headaches are not a factor at the present time. Restless leg syndrome is no longer bothering her either. She does request refills on her Lotrel 5-20 as well as Atrovent nasal spray. It is allergy season she points out. I had prescribed her Lamisil for onychomycosis in the past but she did not take the medication for more than one week.  She does request that we check her urine today. She has a little bit of urinary frequency but is not a great problem. There is no dysuria. She has nocturia 0-1. She does consume a little bit of caffeine. She does point out that she's had urinary tract infections in the past without dysuria    The following portions of the patient's history were reviewed and updated as appropriate: allergies, current medications, past family history, past medical history, past social history, past surgical history and problem list.    Review of Systems   All other systems reviewed and are negative.          Objective:    Physical Exam   Constitutional: She is oriented to person, place, and time. She appears well-developed. No distress.   Pleasant obese white female in no apparent distress   HENT:   Head: Normocephalic and atraumatic.   Right Ear: External ear normal.   Left Ear: External ear normal.   Nose: Nose normal.   Mouth/Throat: Oropharynx is clear and moist. No oropharyngeal exudate.   Eyes: Conjunctivae and EOM are normal. Pupils are equal, round, and reactive to light. Right eye exhibits no discharge. Left eye  exhibits no discharge. No scleral icterus.   Neck: Normal range of motion. Neck supple. No JVD present. No tracheal deviation present. No thyromegaly present.   Cardiovascular: Normal rate, regular rhythm and normal heart sounds.  Exam reveals no gallop and no friction rub.    No murmur heard.  Pulmonary/Chest: Effort normal and breath sounds normal. No stridor. No respiratory distress. She has no wheezes. She has no rales. She exhibits no tenderness.   Abdominal: Soft. Bowel sounds are normal. She exhibits no distension and no mass. There is no tenderness. There is no rebound and no guarding.   Musculoskeletal: Normal range of motion. She exhibits no edema or tenderness.   Lymphadenopathy:     She has no cervical adenopathy.   Neurological: She is alert and oriented to person, place, and time. No cranial nerve deficit. Coordination normal.   Skin: Skin is warm and dry. No rash noted. She is not diaphoretic. No erythema. No pallor.   Psychiatric: She has a normal mood and affect. Her behavior is normal. Judgment and thought content normal.   Nursing note and vitals reviewed.          Assessment:       1. Essential hypertension, benign  CBC and differential    TSH    Lipid panel    Hemoglobin A1C    Comprehensive metabolic panel    T3, free    T4, free   2. Dyslipidemia  CBC and  differential    TSH    Lipid panel    Hemoglobin A1C    Comprehensive metabolic panel    T3, free    T4, free   3. Graves disease  CBC and differential    TSH    Lipid panel    Hemoglobin A1C    Comprehensive metabolic panel    T3, free    T4, free   4. Pre-diabetes  CBC and differential    TSH    Lipid panel    Hemoglobin A1C    Comprehensive metabolic panel    T3, free    T4, free   5. Essential hypertension  amLODIPine-benazepril (LOTREL 5-20) 5-20 MG per capsule   6. Urinary frequency  POCT UA Dipstix (10)(Multi-Test)    Urine Culture             Plan:       1.The patient was recommended a whole food no added sugar diet along with an  exercise program 4-5 days a week for 45 minutes daily to promote weight loss. They were also advised to eat 3-4 small meals a day as well as not eating late at night.  2. Fasting labs  3. Refills on Atrovent nasal spray as well as Lotrel and amitriptyline.  4. Follow-up 3 months.  5. Urine dipstick trace positive for blood and 2+ leukocytes. We will send for culture and sensitivity and treat based on the results.

## 2014-11-09 ENCOUNTER — Telehealth (RURAL_HEALTH_CENTER): Payer: Self-pay | Admitting: Family Medicine

## 2014-11-09 NOTE — Telephone Encounter (Signed)
MyChart message sent. Encounter Closed//jlm

## 2014-11-09 NOTE — Telephone Encounter (Signed)
-----   Message from Marchia Meiers, MD sent at 11/09/2014 10:34 AM EDT -----  Urine culture was negative. No need for antibiotics.

## 2015-01-20 ENCOUNTER — Emergency Department
Admission: EM | Admit: 2015-01-20 | Discharge: 2015-01-20 | Disposition: A | Discharge status: Home / Self Care | Payer: Medicare PPO | Attending: Emergency Medicine | Admitting: Emergency Medicine

## 2015-01-20 ENCOUNTER — Emergency Department: Payer: Medicare PPO

## 2015-01-20 DIAGNOSIS — W0110XA Fall on same level from slipping, tripping and stumbling with subsequent striking against unspecified object, initial encounter: Secondary | ICD-10-CM | POA: Insufficient documentation

## 2015-01-20 DIAGNOSIS — S0181XA Laceration without foreign body of other part of head, initial encounter: Secondary | ICD-10-CM

## 2015-01-20 DIAGNOSIS — S80212A Abrasion, left knee, initial encounter: Secondary | ICD-10-CM | POA: Insufficient documentation

## 2015-01-20 DIAGNOSIS — S01112A Laceration without foreign body of left eyelid and periocular area, initial encounter: Secondary | ICD-10-CM | POA: Insufficient documentation

## 2015-01-20 DIAGNOSIS — S62647A Nondisplaced fracture of proximal phalanx of left little finger, initial encounter for closed fracture: Secondary | ICD-10-CM

## 2015-01-20 MED ORDER — ACETAMINOPHEN 500 MG PO TABS
1000.0000 mg | ORAL_TABLET | Freq: Once | ORAL | Status: AC
Start: 2015-01-20 — End: 2015-01-20
  Administered 2015-01-20: 1000 mg via ORAL

## 2015-01-20 MED ORDER — ACETAMINOPHEN 500 MG PO TABS
ORAL_TABLET | ORAL | Status: AC
Start: 2015-01-20 — End: ?
  Filled 2015-01-20: qty 2

## 2015-01-20 MED ORDER — HYDROCODONE-ACETAMINOPHEN 5-325 MG PO TABS
1.0000 | ORAL_TABLET | Freq: Four times a day (QID) | ORAL | Status: DC | PRN
Start: 2015-01-20 — End: 2015-02-01

## 2015-01-20 NOTE — ED Notes (Signed)
TRIPPED ON STEPS TODAY AT APPROX 1230 AND HIT LT SIDE OF FACE ON BLACKTOP AND HURT LT 5TH FINGER ALSO. ABRASION TO FACE. PT DENIES LOC OR CHANGE IN VISION

## 2015-01-20 NOTE — ED Notes (Signed)
Neosporin ointment to facial abrasions

## 2015-01-20 NOTE — ED Provider Notes (Signed)
Physician/Midlevel provider first contact with patient: 01/20/15 1332         History     Chief Complaint   Patient presents with   . Abrasion     LT FACE AREA     HPI Comments: Patient is a 69 year old female who presents to ED with concern for abrasion and bruising of left face, left knee, and pain in her left pinky finger. These began following a fall off of her porch steps onto the pavement around 12:30 this afternoon. She reports that she was trimming planes along the stairs and when she turned around she misstepped and stumbled down 2 or 3 steps onto her left side. She mostly caught herself with her left hand but did hit her left frontal/temporal face on the concrete. She denies any loss of consciousness, headache, nausea, vomiting, changes in vision, numbness, weakness, or difficulty with memory or concentration. She is not on any blood thinner at this time aside from aspirin. She also notes that her left pinky is quite painful particularly with palpation and bending of the MCP. She denies any neck pain, neck stiffness, back pain, or shoulder pain.    Patient is a 69 y.o. female presenting with abrasion. The history is provided by the patient.   Abrasion  Associated symptoms include joint swelling (MCP of left pinky). Pertinent negatives include no abdominal pain, chest pain, chills, congestion, coughing, fever, headaches, myalgias, nausea, neck pain, numbness, rash, sore throat, vomiting or weakness.        Nursing (triage) note reviewed for the following pertinent information:    TRIPPED DOWN STEPS TODAY AND HIT LT SIDE OF FACE ON BLACKTOP. INCIDENT 1230 TODAY    Past Medical History   Diagnosis Date   . Arthritis    . Unspecified glaucoma    . Mixed hyperlipidemia    . Essential hypertension, benign    . Tendonitis    . 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 Restless legs syndrome (RLS)    . Depression    . High cholesterol    . Diabetes mellitus      [facility]-  NO MEDS REQUIRED AT THIS TIME.   Marland Kitchen Dyslipidemia 03/30/2013   . Graves disease 03/30/2013   . Osteoarthritis 03/30/2013   . Chronic headache 03/30/2013   . GERD (gastroesophageal reflux disease) 03/30/2013   . Osteopenia 03/30/2013   . Pre-diabetes 02/23/2014       Past Surgical History   Procedure Laterality Date   . 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 Dilation and curettage of uterus     . Excision, bone spur       left little toe    . Colonoscopy  02/11/2013     Procedure: COLONOSCOPY;  Surgeon: Dineen Kid, MD;  Location: [facility];  Service: Gastroenterology;  Laterality: N/A;   . Colonoscopy  08/14     - colonoscopy   . Cataract extraction w/ intraocular lens implant  08/2013     right eye       Family History   Problem Relation Age of Onset   . Cancer Other      BREAST CANCER.   . Colon polyps Other    . Anesthesia problems Other    . Heart disease Mother    . Heart attack Father    . Asthma Brother    . Heart disease Maternal Grandfather    .  Heart disease Paternal Grandfather    . Cancer Sister    . Arthritis Sister    . COPD Sister    . Diabetes Sister    . Vasculitis Brother        Social  History   Substance Use Topics   . Smoking status: Former Smoker     Quit date: 07/22/1982   . Smokeless tobacco: Never Used   . Alcohol Use: Yes      Comment: wine 3/week       .     Allergies   Allergen Reactions   . Codeine Nausea Only   . Prednisone      Causes depression       Home Medications     Last Medication Reconciliation Action:  Complete Marjo Bicker, RN 01/20/2015  1:19 PM                  amitriptyline (ELAVIL) 10 MG tablet     Take 1 tablet (10 mg total) by mouth nightly as needed.     amLODIPine-benazepril (LOTREL 5-20) 5-20 MG per capsule     Take 1 capsule by mouth daily.     aspirin 81 MG tablet     Take 81 mg by mouth daily.     atorvastatin (LIPITOR) 20 MG tablet     TAKE 1 TABLET BY MOUTH EVERY DAY     b complex vitamins capsule     Take 1 capsule by mouth daily.     bimatoprost (LUMIGAN) 0.03 %  ophthalmic drops     Place 1 drop into both eyes nightly.     brimonidine (ALPHAGAN) 0.2 % ophthalmic solution     Place 1 drop into both eyes 2 (two) times daily.     Calcium Carbonate-Vitamin D (CALTRATE 600+D PO)     Take 1 tablet by mouth daily.     Cetirizine HCl (ZYRTEC PO)     Take 10 mg by mouth as needed.      Cholecalciferol (VITAMIN D) 1000 UNIT tablet     Take 1,000 Units by mouth daily.     CO-ENZYME Q-10 PO     Take 300 mg by mouth daily.     dorzolamide-timolol (COSOPT) 22.3-6.8 MG/ML ophthalmic solution     Apply 1 drop to eye 2 (two) times daily.     FLUoxetine (PROZAC) 10 MG capsule     TAKE 1 TABLET BY MOUTH EVERY DAY     ipratropium (ATROVENT) 0.06 % nasal spray     2 sprays by Nasal route 3 (three) times daily as needed for Rhinitis.     latanoprost (XALATAN) 0.005 % ophthalmic solution          Multiple Vitamin (MULTIVITAMIN) tablet     Take 1 tablet by mouth daily.     nabumetone (RELAFEN) 500 MG tablet     Take 2 tablets (1,000 mg total) by mouth daily.     Omega-3 Fatty Acids (FISH OIL BURP-LESS) 1000 MG Cap     Take 1 capsule by mouth daily.     omeprazole (PRILOSEC) 20 MG capsule     TAKE ONE CAPSULE BY MOUTH ONCE DAILY     terbinafine (LAMISIL) 250 MG tablet     Take 1 tablet (250 mg total) by mouth daily.                     Review of Systems   Constitutional: Negative for fever and  chills.   HENT: Negative for congestion, postnasal drip, rhinorrhea, sinus pressure and sore throat.    Eyes: Negative for visual disturbance.   Respiratory: Negative for cough, chest tightness, shortness of breath and wheezing.    Cardiovascular: Negative for chest pain and leg swelling.   Gastrointestinal: Negative for nausea, vomiting, abdominal pain, diarrhea, constipation and blood in stool.   Genitourinary: Negative for dysuria and frequency.   Musculoskeletal: Positive for joint swelling (MCP of left pinky). Negative for myalgias, back pain, neck pain and neck stiffness.   Skin: Negative for color  change and rash.   Neurological: Negative for dizziness, weakness, numbness and headaches.       Physical Exam    BP: (!) 130/97 mmHg, Heart Rate: 75, Temp: 97.4 F (36.3 C), Resp Rate: 16, SpO2: 96 %, Weight: 90.719 kg    Physical Exam   Constitutional: She is oriented to person, place, and time. She appears well-developed and well-nourished.   HENT:   Head: Normocephalic and atraumatic.       Eyes: EOM are normal. Pupils are equal, round, and reactive to light.   Neck: Normal range of motion. Neck supple.   Pulmonary/Chest: Effort normal.   Musculoskeletal:        Right wrist: She exhibits normal range of motion, no tenderness, no bony tenderness, no swelling, no effusion, no deformity and no laceration.        Cervical back: She exhibits normal range of motion, no tenderness, no bony tenderness, no swelling, no deformity, no pain and no spasm.        Hands:  Swelling noted over left pinky MCP and proximal phalaynx.  Tender to palpation over left pinky proximal phalaynx and MCP.  Decreased ROM of pinky due to pain and swelling.     Lymphadenopathy:     She has no cervical adenopathy.   Neurological: She is alert and oriented to person, place, and time. She has normal reflexes.   Skin: Skin is warm and dry. No rash noted. No erythema.   Psychiatric: She has a normal mood and affect. Her behavior is normal.   Nursing note and vitals reviewed.    Radiology Results (24 Hour)     Procedure Component Value Units Date/Time    XR Finger Left Minimum 2 Vw [[id number]] Collected:  01/20/15 1419    Order Status:  Completed Updated:  01/20/15 1423    Narrative:      Clinical History:  left pinky pain following fall    Examination:  AP, lateral and oblique views of the left fifth finger.    Comparison:  None available.    Findings:  There is a predominantly sagittally oriented fracture through the ulnar aspect of the fifth proximal phalangeal base.  Extension to the articular surface is suspected without appreciable articular  surface incongruity. There is slight volar  displacement of the distal fracture fragment with slight apex volar angulation. Surrounding soft tissue swelling is  present. No significant degenerative changes are seen.      Impression:      Proximal fifth phalangeal fracture as described above.    ReadingStation:[id number]            MDM and ED Course     ED Medication Orders     Start Ordered     Status Ordering Provider    01/20/15 1354 01/20/15 1353  acetaminophen (TYLENOL) tablet 1,000 mg   Once in ED     Route: Oral  Ordered Dose: 1,000  mg     Last MAR action:  Given Latoyia Tecson A             MDM    Reviewed this patient's history with Dr. Freida Busman who also examined the patient. X-ray of left pinky did reveal fracture. She was placed in a splint and recommended orthopedic follow-up next week. During this encounter but does not appear to be any indication to CT her head given the mechanism of injury, no loss of consciousness, no focal symptoms. Head lacerations were closed using skin glue. Return precautions were given for any confusion, headache, change in vision, or lethargy. Patient understands and agrees with plan.      Lac Repair  Date/Time: 01/21/2015 12:12 AM  Performed by: Risa Grill A  Authorized by: Chucky May    Consent:     Consent obtained:  Verbal    Consent given by:  Patient    Risks discussed:  Poor cosmetic result and infection    Alternatives discussed:  No treatment  Anesthesia (see MAR for exact dosages):     Anesthesia method:  None  Laceration details:     Location:  Face    Face location:  L eyebrow    Length (cm):  2.5    Depth (mm):  1  Repair type:     Repair type:  Simple  Pre-procedure details:     Preparation:  Patient was prepped and draped in usual sterile fashion  Exploration:     Hemostasis achieved with:  Direct pressure    Contaminated: no    Treatment:     Area cleansed with:  Hibiclens    Amount of cleaning:  Standard    Visualized foreign bodies/material removed: no      Skin repair:     Repair method:  Tissue adhesive  Approximation:     Approximation:  Loose    Vermilion border: well-aligned    Post-procedure details:     Dressing:  Open (no dressing)    Patient tolerance of procedure:  Tolerated well, no immediate complications      Clinical Impression & Disposition     Clinical Impression  Final diagnoses:   Abrasion, left knee, initial encounter   Laceration of forehead, left, complicated, initial encounter   Closed nondisplaced fracture of proximal phalanx of left little finger, initial encounter        ED Disposition     Discharge [facility] discharge to home/self care.    Condition at disposition: Stable             Discharge Medication List as of 01/20/2015  2:50 PM                      Neva Seat, PA  01/21/15 [phone]

## 2015-01-20 NOTE — ED Notes (Signed)
5th finger buddy taped to 4th, 2x2 between fingers. Finger splint placed to 5th finger with 2" ace wrap

## 2015-01-20 NOTE — Discharge Instructions (Signed)
Finger Fracture,Closed  You have a broken finger (fracture). This causes local pain, swelling, and bruising. This injury takesabout4 to 6 weeksto heal. Finger injuries are often treated with a splint or cast, or by taping the injured finger to the next one (buddy taping). This protects the injured finger and holds the bone in position while it heals. More serious fractures may need surgery.    If the fingernail has been severely injured, it will probably fall off in 1 to 2 weeks. A new fingernail will usually start to grow back within a month.  Home care  Follow these guidelines when caring for yourself at home:   Keep your hand elevated to reduce pain and swelling. When sitting or lying down keep your arm above the level of your heart. You can do this by placing your arm on a pillow that rests on your chest or on a pillow at your side. This is most important during the first 2 days (48 hours) after the injury.   Put an ice pack on the injured area. Do this for 20 minutes every 1 to 2 hours the first day for pain relief. You can make an ice pack by wrapping a plastic bag of ice cubes in a thin towel. Continue using the ice pack 3 to 4 times a day until the pain and swelling go away.   Keep the cast or splint completely dry at all times. Bathe with your cast or splint out of the water. Protect it with a large plastic bag, rubber-banded at the top end. If a fiberglass cast or splint gets wet, you can dry it with a hair dryer.   If buddy tape was put on and it becomes wet or dirty, change it. You may replace it with paper, plastic, or cloth tape. Cloth tape and paper tapes must be kept dry. Keep the buddy tape in place for at least 4 weeks.   You may use acetaminophen or ibuprofen to control pain, unless another pain medicine was prescribed. If you have chronic liver or kidney disease, talk with your health care provider before using these medicines. Also talk with your provider if you've had a stomach ulcer  or GI bleeding.   Don't put creams or objects under the cast if you have itching.  Follow-up care  Follow up with your health care provider within 1 week, or as advised. This is to make sure the bone is healing the way it should.  If X-rays were taken, a radiologist will look at them. You will be told of any new findings that may affect your care.  When to seek medical advice  Call your health care provider right away if any of these occur:   The plaster cast or splint becomes wet or soft   The cast cracks   The fiberglass cast or splint stays wet for more than 24 hours   Pain or swelling gets worse   Redness, warmth, swelling, drainage from the wound, or foul odor from a cast or splint   Finger becomes more cold, blue, numb, or tingly   You can't move your finger   The skin around the cast becomes red   Fever of 101F (38.3C) or higher, or as directed by your health care provider   2000-2015 [employer] [address] [place] [address]. All rights reserved. This information is not intended as a substitute for professional medical care. Always follow your healthcare professional's instructions.  Laceration, Face(Skin Glue)  A laceration is a cut through the skin. A laceration on your face hasbeen closed with a type of skin glue.    Home Care  Medications: Acetaminophen (Tylenol) or ibuprofen (Motrin, Advil) may be taken for pain, unless another pain medicine was prescribed. NOTE: If you have chronic liver or kidney disease or ever had a stomach ulcer or GI bleeding, talk with your doctor before using these medications.  General Care:    Keep the wound clean and dry. You may shower or bathe as usual, but do not use soaps, lotions, or ointments on the wound area. Do not scrub the wound. After bathing, pat the wound dry with a soft towel.   Do not scratch, rub, or pick at the film. Do not place tape directly over the film.   Do not apply liquids (such as peroxide),  ointments, or creams to the wound while the film is in place.   Most facialskin wounds heal without problems. However, an infection sometimes occurs despite proper treatment. Therefore, watch for the signs of infection listed below.  Follow Up  as directed by the doctor or our staff. The skin glue film will fall off naturally in 5 to 10 days.  Get Prompt Medical Attention  if any of the following occur:   Signs of infection:   Fever of 100.12F (38C) or higher, or as directed by your healthcare provider   Increasing pain in the wound   Increasing redness or swelling   Pus coming from the wound   Wound bleeds more than a small amount or bleeding doesn't stop   Wound edges come apart   2000-2015 [employer] [address], [place], [place] [address]. All rights reserved. This information is not intended as a substitute for professional medical care. Always follow your healthcare professional's instructions.

## 2015-01-25 ENCOUNTER — Encounter (INDEPENDENT_AMBULATORY_CARE_PROVIDER_SITE_OTHER): Payer: Self-pay | Admitting: Orthopaedic Surgery

## 2015-01-25 ENCOUNTER — Ambulatory Visit (INDEPENDENT_AMBULATORY_CARE_PROVIDER_SITE_OTHER): Payer: Medicare PPO | Admitting: Orthopaedic Surgery

## 2015-01-25 VITALS — BP 132/84 | HR 74

## 2015-01-25 DIAGNOSIS — Z87891 Personal history of nicotine dependence: Secondary | ICD-10-CM

## 2015-01-25 DIAGNOSIS — S62647D Nondisplaced fracture of proximal phalanx of left little finger, subsequent encounter for fracture with routine healing: Secondary | ICD-10-CM

## 2015-01-25 DIAGNOSIS — W108XXD Fall (on) (from) other stairs and steps, subsequent encounter: Secondary | ICD-10-CM

## 2015-01-25 NOTE — Progress Notes (Signed)
Progress Note      Chief Complaint   Patient presents with   . Finger Injury       Subjective/HPI Comments:    Patient is a  69 y.o. female who presents as a new patient with a fracture to the left little finger. Pt fell down a set of outdoor steps when her flip flop got caught underneath her and landed on pavement. Pt injured her hand, ribs, and face. Was see in [facility] ER on 01/20/2015.  Minimal discomfort couldn't really tolerate the narcotic pain pills she was given in the emergency room  Date of Injury: 01/19/2014   Date of Surgery: n/a  Date Last Seen: n/a    Dominant Side: Right    Pain level today on a scale of 0-10, with 10 being the highest, is 5    Pain Quality:    _x__ Aching    ___ Burning    ___ Shooting    ___ Stabbing    ___ Other:     Pain Course:       _x__ Constant    ___ Intermittent    ___ Fluctuating    _x__ Improving    ___ Worsening    Associated Symptoms:        ___ Numbness      ___ Weakness      __x_ Decreased Range of Motion      ___ Stiffness      _x__ Pain with Activity      ___ Night Pain      ___ Locking and Catching      ___ Other:    Previous Treatment:      ___ Rest      ___ Ice      ___ Heat      _x__ Tylenol      _x__ Anti-inflammatory medication       ___ Other:    Patient lives with husband  Patient is retired from [employer] school system.    Outpatient Prescriptions Marked as Taking for the 01/25/15 encounter (Office Visit) with Areatha Keas, MD   Medication Sig Dispense Refill   . amitriptyline (ELAVIL) 10 MG tablet Take 1 tablet (10 mg total) by mouth nightly as needed. 90 tablet 2   . amLODIPine-benazepril (LOTREL 5-20) 5-20 MG per capsule Take 1 capsule by mouth daily. 90 capsule 4   . aspirin 81 MG tablet Take 81 mg by mouth daily.     Marland Kitchen atorvastatin (LIPITOR) 20 MG tablet TAKE 1 TABLET BY MOUTH EVERY DAY 90 tablet 3   . b complex vitamins capsule Take 1 capsule by mouth daily.     . bimatoprost (LUMIGAN) 0.03 % ophthalmic drops Place 1 drop into both eyes nightly.     .  brimonidine (ALPHAGAN) 0.2 % ophthalmic solution Place 1 drop into both eyes 2 (two) times daily.     . Calcium Carbonate-Vitamin D (CALTRATE 600+D PO) Take 1 tablet by mouth daily.     . Cetirizine HCl (ZYRTEC PO) Take 10 mg by mouth as needed.      . Cholecalciferol (VITAMIN D) 1000 UNIT tablet Take 1,000 Units by mouth daily.     Marland Kitchen CO-ENZYME Q-10 PO Take 300 mg by mouth daily.     . dorzolamide-timolol (COSOPT) 22.3-6.8 MG/ML ophthalmic solution Apply 1 drop to eye 2 (two) times daily.     Marland Kitchen FLUoxetine (PROZAC) 10 MG capsule TAKE 1 TABLET BY MOUTH EVERY DAY 90 capsule 3   .  HYDROcodone-acetaminophen (NORCO) 5-325 MG per tablet Take 1 tablet by mouth every 6 (six) hours as needed for Pain. 10 tablet 0   . ipratropium (ATROVENT) 0.06 % nasal spray 2 sprays by Nasal route 3 (three) times daily as needed for Rhinitis. 15 mL 5   . latanoprost (XALATAN) 0.005 % ophthalmic solution      . Multiple Vitamin (MULTIVITAMIN) tablet Take 1 tablet by mouth daily.     . nabumetone (RELAFEN) 500 MG tablet Take 2 tablets (1,000 mg total) by mouth daily. 60 tablet 3   . Omega-3 Fatty Acids (FISH OIL BURP-LESS) 1000 MG Cap Take 1 capsule by mouth daily.     Marland Kitchen omeprazole (PRILOSEC) 20 MG capsule TAKE ONE CAPSULE BY MOUTH ONCE DAILY 90 capsule 3   . terbinafine (LAMISIL) 250 MG tablet Take 1 tablet (250 mg total) by mouth daily. 90 tablet 0       Review of Systems:  Review of Systems   Constitutional: Positive for activity change.        Unable to use left hand. Currently in a splint from the ER   HENT: Positive for hearing loss.    Eyes: Positive for visual disturbance.        Glaucoma in right eye   Respiratory: Negative for shortness of breath.    Cardiovascular: Negative for chest pain, palpitations and leg swelling.   Gastrointestinal: Negative for abdominal pain and diarrhea.   Genitourinary: Negative for urgency.   Musculoskeletal: Positive for joint swelling. Negative for gait problem.        Hand and fingers swollen    Neurological: Negative for seizures, syncope and headaches.   Psychiatric/Behavioral: Negative for dysphoric mood. The patient is nervous/anxious.         Pt states she is anxious about her injuries            Information above has been gathered by office staff and reviewed and amended by me as needed.  The following portions of the patient's history were reviewed and updated as appropriate: allergies, current medications, past family history, past medical history, past social history, past surgical history, and problem list.    Objective/ Physical Exam:    BP 132/84 mmHg  Pulse 74  Patient is alert and oriented.   Examination of left hand shows some mild to moderate dorsal hand swelling all finger has pain over the proximal phalanx but clinically well aligned skins intact no malrotation distal neurovascular status normal    Test Results:  Xrays done at [facility] on 01/20/2015  Shows a stellate fracture proximal phalanx pretty much nondisplaced extra-articular    Orders:    No orders of the defined types were placed in this encounter.       Assessment: Nondisplaced proximal left small finger phalanx fracture initial encounter    1. Nondisp fx of proximal phalanx of left little finger with routine heal        Plan:    The patient has been informed of all ordered tests and/or consults, if applicable.  All patient concerns and questions have been addressed. Patient continue in the present splint which is adequate for immobilization   Work Status:Follow up: One week repeat x-rays at that time. Patient's leaving the following Friday to visit her sister for 80th birthday in [place]

## 2015-01-31 ENCOUNTER — Ambulatory Visit
Admission: RE | Admit: 2015-01-31 | Discharge: 2015-01-31 | Disposition: A | Discharge status: Home / Self Care | Payer: Medicare PPO | Source: Ambulatory Visit | Attending: Orthopaedic Surgery | Admitting: Orthopaedic Surgery

## 2015-01-31 DIAGNOSIS — S62647D Nondisplaced fracture of proximal phalanx of left little finger, subsequent encounter for fracture with routine healing: Secondary | ICD-10-CM

## 2015-01-31 DIAGNOSIS — S6992XA Unspecified injury of left wrist, hand and finger(s), initial encounter: Secondary | ICD-10-CM | POA: Insufficient documentation

## 2015-02-01 ENCOUNTER — Ambulatory Visit (INDEPENDENT_AMBULATORY_CARE_PROVIDER_SITE_OTHER): Payer: Medicare PPO | Admitting: Orthopaedic Surgery

## 2015-02-01 ENCOUNTER — Encounter (INDEPENDENT_AMBULATORY_CARE_PROVIDER_SITE_OTHER): Payer: Self-pay | Admitting: Orthopaedic Surgery

## 2015-02-01 ENCOUNTER — Ambulatory Visit
Admission: RE | Admit: 2015-02-01 | Discharge: 2015-02-01 | Disposition: A | Discharge status: Home / Self Care | Payer: Medicare PPO | Source: Ambulatory Visit | Attending: Family Medicine | Admitting: Family Medicine

## 2015-02-01 ENCOUNTER — Encounter (RURAL_HEALTH_CENTER): Payer: Self-pay | Admitting: Family Medicine

## 2015-02-01 ENCOUNTER — Ambulatory Visit (RURAL_HEALTH_CENTER): Payer: Medicare PPO | Admitting: Family Medicine

## 2015-02-01 VITALS — BP 142/88 | HR 68 | Ht 66.0 in | Wt 208.0 lb

## 2015-02-01 VITALS — BP 146/85 | HR 64 | Temp 98.1°F | Resp 18 | Ht 66.0 in | Wt 208.0 lb

## 2015-02-01 DIAGNOSIS — R7309 Other abnormal glucose: Secondary | ICD-10-CM

## 2015-02-01 DIAGNOSIS — Z5189 Encounter for other specified aftercare: Secondary | ICD-10-CM

## 2015-02-01 DIAGNOSIS — E785 Hyperlipidemia, unspecified: Secondary | ICD-10-CM

## 2015-02-01 DIAGNOSIS — R0781 Pleurodynia: Secondary | ICD-10-CM | POA: Insufficient documentation

## 2015-02-01 DIAGNOSIS — R7303 Prediabetes: Secondary | ICD-10-CM

## 2015-02-01 DIAGNOSIS — S0083XA Contusion of other part of head, initial encounter: Secondary | ICD-10-CM

## 2015-02-01 DIAGNOSIS — E0500 Thyrotoxicosis with diffuse goiter without thyrotoxic crisis or storm: Secondary | ICD-10-CM

## 2015-02-01 DIAGNOSIS — Z87891 Personal history of nicotine dependence: Secondary | ICD-10-CM

## 2015-02-01 DIAGNOSIS — S62647D Nondisplaced fracture of proximal phalanx of left little finger, subsequent encounter for fracture with routine healing: Secondary | ICD-10-CM

## 2015-02-01 DIAGNOSIS — W108XXD Fall (on) (from) other stairs and steps, subsequent encounter: Secondary | ICD-10-CM

## 2015-02-01 DIAGNOSIS — I10 Essential (primary) hypertension: Secondary | ICD-10-CM

## 2015-02-01 NOTE — Patient Instructions (Signed)
Please wash the wound on her face 3 times daily with either hydrogen peroxide mixed with warm water or warm soapy water. Rinse pack dry and apply some bag balm ointment topically.  Please get back on your whole food diet plan avoiding sweets and processed food.

## 2015-02-01 NOTE — Progress Notes (Signed)
Subjective:    Patient ID: Brittany Berry is a 69 y.o. female.    HPI Vinie returns today for medical management. She is actually doing well. About 12 days ago she took a fall while outside of her home. She ended up in the emergency department causing a laceration and contusion to the left side of her face. She also fractured her fifth finger. She does have pain in the left anterolateral rib cage. Hurts to cough sneeze and deep breathe. She's not short of breath. This was not evaluated in the emergency department because apparently other things were bothering her a lot more and she failed to mention that to the ER physician. She did mention it to Dr. Ascencion Dike who is following her for her fractured finger.  Her medical problem list includes hypertension, dyslipidemia, prediabetes, Graves' disease and weight issues. She does admit not being able to exercise recently due to her injuries. She denies any headache fever chills or shortness of breath.  She has not fasting this morning and would prefer to return in the morning for blood work.    The following portions of the patient's history were reviewed and updated as appropriate: allergies, current medications, past family history, past medical history, past social history, past surgical history and problem list.    Review of Systems   All other systems reviewed and are negative.          Objective:    Physical Exam   Constitutional: She is oriented to person, place, and time. She appears well-developed. No distress.   Pleasant white female in no acute distress.   HENT:   Head: Normocephalic.   Right Ear: External ear normal.   Left Ear: External ear normal.   Nose: Nose normal.   Mouth/Throat: Oropharynx is clear and moist. No oropharyngeal exudate.   Patient has a scabbed wound of her left forehead just above the lateral eyebrow. This was washed with warm soapy water along with bacitracin ointment application and a Band-Aid. No signs of any secondary infection.  I do not feel any palpable skull or orbital fracture. She does have a significant bruise of her left cheek. She tells me this is markedly improved from several days ago. He does not appear to be having any problems opening in closing her mouth and there is no dental disruption or pain.   Eyes: Conjunctivae and EOM are normal. Pupils are equal, round, and reactive to light. Right eye exhibits no discharge. Left eye exhibits no discharge. No scleral icterus.   Neck: Normal range of motion. Neck supple. No JVD present. No tracheal deviation present. No thyromegaly present.   Cardiovascular: Normal rate, regular rhythm, normal heart sounds and intact distal pulses.  Exam reveals no gallop and no friction rub.    No murmur heard.  Pulmonary/Chest: Effort normal and breath sounds normal. No stridor. No respiratory distress. She has no wheezes. She has no rales. She exhibits no tenderness.   Abdominal: Soft. Bowel sounds are normal. She exhibits no distension and no mass. There is no tenderness. There is no rebound and no guarding.   Musculoskeletal: Normal range of motion. She exhibits edema and tenderness.   Left hand has a Ace wrap on it with a splint of her third and fourth finger. There is modest swelling of the middle and index finger with some bruising discoloration. She has good capillary refill.   Lymphadenopathy:     She has no cervical adenopathy.   Neurological: She is alert  and oriented to person, place, and time. No cranial nerve deficit. Coordination normal.   Skin: Skin is warm and dry. No rash noted. She is not diaphoretic. No erythema. No pallor.   Psychiatric: She has a normal mood and affect. Her behavior is normal. Judgment and thought content normal.   Nursing note and vitals reviewed.          Assessment:       1. Essential hypertension, benign    2. Dyslipidemia    3. Pre-diabetes    4. Encounter for wound care    5. Rib pain on left side    6. Graves disease    7. Contusion of face, initial  encounter          Plan:       1. Wound over her left eyebrow was washed with warm soapy water and bacitracin ointment applied. Wound care and infection precautions were discussed. Like for her to wash the wound 3 times a day rinse and then apply bag balm ointment topically.  2. Chest x-ray has been ordered to rule out a left rib fracture  3. She'll return tomorrow morning for fasting lab work  4.The patient was recommended a whole food no added sugar diet along with an exercise program 4-5 days a week for 45 minutes daily to promote weight loss. They were also advised to eat 3-4 small meals a day as well as not eating late at night.  5. Provided she does well I'll see her in 3 months. Return sooner if needed.

## 2015-02-01 NOTE — Progress Notes (Signed)
Progress Note      Chief Complaint   Patient presents with   . Finger Injury     Nondisp fx of proximal phalanx of left little finger with routine heal        Subjective/HPI Comments:    Patient is a  69 y.o. female who presents follow up on finger fracture that occurred when she tripped down her outdoor stairs.. Pt states she is still having the same amount of pain and swelling in the finger. She has only removed her splint for a few minutes at a time, while sitting done watching TV. I much progress according to her    Date of Injury: 01/20/2015  Date of Surgery: n/a  Date Last Seen: 01/25/2015    Dominant Side: right       Pain level today on a scale of 0-10, with 10 being the highest, is 5    Patient lives with husband.  Patient is retired.    Outpatient Prescriptions Marked as Taking for the 02/01/15 encounter (Office Visit) with Areatha Keas, MD   Medication Sig Dispense Refill   . amitriptyline (ELAVIL) 10 MG tablet Take 1 tablet (10 mg total) by mouth nightly as needed. 90 tablet 2   . amLODIPine-benazepril (LOTREL 5-20) 5-20 MG per capsule Take 1 capsule by mouth daily. 90 capsule 4   . aspirin 81 MG tablet Take 81 mg by mouth daily.     Marland Kitchen atorvastatin (LIPITOR) 20 MG tablet TAKE 1 TABLET BY MOUTH EVERY DAY 90 tablet 3   . b complex vitamins capsule Take 1 capsule by mouth daily.     . bimatoprost (LUMIGAN) 0.03 % ophthalmic drops Place 1 drop into both eyes nightly.     . brimonidine (ALPHAGAN) 0.2 % ophthalmic solution Place 1 drop into both eyes 2 (two) times daily.     . Calcium Carbonate-Vitamin D (CALTRATE 600+D PO) Take 1 tablet by mouth daily.     . Cetirizine HCl (ZYRTEC PO) Take 10 mg by mouth as needed.      . Cholecalciferol (VITAMIN D) 1000 UNIT tablet Take 1,000 Units by mouth daily.     Marland Kitchen CO-ENZYME Q-10 PO Take 300 mg by mouth daily.     . dorzolamide-timolol (COSOPT) 22.3-6.8 MG/ML ophthalmic solution Apply 1 drop to eye 2 (two) times daily.     Marland Kitchen FLUoxetine (PROZAC) 10 MG capsule TAKE 1  TABLET BY MOUTH EVERY DAY 90 capsule 3   . ipratropium (ATROVENT) 0.06 % nasal spray 2 sprays by Nasal route 3 (three) times daily as needed for Rhinitis. 15 mL 5   . latanoprost (XALATAN) 0.005 % ophthalmic solution      . Multiple Vitamin (MULTIVITAMIN) tablet Take 1 tablet by mouth daily.     . nabumetone (RELAFEN) 500 MG tablet Take 2 tablets (1,000 mg total) by mouth daily. 60 tablet 3   . Omega-3 Fatty Acids (FISH OIL BURP-LESS) 1000 MG Cap Take 1 capsule by mouth daily.     Marland Kitchen omeprazole (PRILOSEC) 20 MG capsule TAKE ONE CAPSULE BY MOUTH ONCE DAILY 90 capsule 3       Review of Systems:  Constitutional: Positive for activity change.        Unable to use left hand. Currently in a splint from the ER   HENT: Positive for hearing loss.    Eyes: Positive for visual disturbance.        Glaucoma in right eye   Respiratory: Negative for shortness of breath.  Cardiovascular: Negative for chest pain, palpitations and leg swelling.   Gastrointestinal: Negative for abdominal pain and diarrhea.   Genitourinary: Negative for urgency.   Musculoskeletal: Positive for joint swelling. Negative for gait problem.        Hand and fingers swollen   Neurological: Negative for seizures, syncope and headaches.   Psychiatric/Behavioral: Negative for dysphoric mood. The patient is nervous/anxious.         Pt states she is anxious about her injuries       Information above has been gathered by office staff and reviewed and amended by me as needed.  The following portions of the patient's history were reviewed and updated as appropriate: allergies, current medications, past family history, past medical history, past social history, past surgical history, and problem list.    Objective/ Physical Exam:    BP 142/88 mmHg  Pulse 68  Ht 1.676 m (5\' 6" )  Wt 94.348 kg (208 lb)  BMI 33.59 kg/m2  Patient is alert and oriented.   Still has some mild swelling good clinical alignment     Test Results:  Xray At [facility] 01/31/2015  Repeat x-rays show  no change in the alignment from initial films early callus formation        Orders:    Orders Placed This Encounter   Procedures   . X-ray finger left minimum 2 views     Standing Status: Future      Number of Occurrences:       Standing Expiration Date: 02/01/2016     Order Specific Question:  Reason for Exam:     Answer:  fx         Assessment:    1. Nondisp fx of proximal phalanx of left little finger with routine heal        Plan:    The patient has been informed of all ordered tests and/or consults, if applicable.  All patient concerns and questions have been addressed.  Patient continuing her splint  Work Status:  Follow up: Return to clinic in 2 weeks for repeat x-rays left hand

## 2015-02-02 ENCOUNTER — Other Ambulatory Visit
Admission: RE | Admit: 2015-02-02 | Discharge: 2015-02-02 | Disposition: A | Discharge status: Home / Self Care | Payer: Medicare PPO | Source: Ambulatory Visit | Attending: Family Medicine | Admitting: Family Medicine

## 2015-02-02 DIAGNOSIS — E785 Hyperlipidemia, unspecified: Secondary | ICD-10-CM

## 2015-02-02 DIAGNOSIS — R0781 Pleurodynia: Secondary | ICD-10-CM

## 2015-02-02 DIAGNOSIS — R7303 Prediabetes: Secondary | ICD-10-CM

## 2015-02-02 DIAGNOSIS — E0500 Thyrotoxicosis with diffuse goiter without thyrotoxic crisis or storm: Secondary | ICD-10-CM

## 2015-02-02 DIAGNOSIS — I10 Essential (primary) hypertension: Secondary | ICD-10-CM

## 2015-02-02 DIAGNOSIS — Z5189 Encounter for other specified aftercare: Secondary | ICD-10-CM

## 2015-02-02 LAB — T4, FREE: T4 Free: 1 ng/dL (ref 0.70–1.48)

## 2015-02-02 LAB — CBC AND DIFFERENTIAL
Basophils %: 0.9 % (ref 0.0–3.0)
Basophils Absolute: 0.1 10*3/uL (ref 0.0–0.3)
Eosinophils %: 2.9 % (ref 0.0–7.0)
Eosinophils Absolute: 0.2 10*3/uL (ref 0.0–0.8)
Hematocrit: 39.9 % (ref 36.0–48.0)
Hemoglobin: 13.2 gm/dL (ref 12.0–16.0)
Lymphocytes Absolute: 2.1 10*3/uL (ref 0.6–5.1)
Lymphocytes: 36.2 % (ref 15.0–46.0)
MCH: 31 pg (ref 28–35)
MCHC: 33 gm/dL (ref 32–36)
MCV: 93 fL (ref 80–100)
MPV: 8.5 fL (ref 6.0–10.0)
Monocytes Absolute: 0.5 10*3/uL (ref 0.1–1.7)
Monocytes: 9.1 % (ref 3.0–15.0)
Neutrophils %: 50.9 % (ref 42.0–78.0)
Neutrophils Absolute: 3 10*3/uL (ref 1.7–8.6)
PLT CT: 322 10*3/uL (ref 130–440)
RBC: 4.27 10*6/uL (ref 3.80–5.00)
RDW: 11.5 % (ref 11.0–14.0)
WBC: 5.9 10*3/uL (ref 4.0–11.0)

## 2015-02-02 LAB — LIPID PANEL
Cholesterol: 175 mg/dL (ref 75–199)
Coronary Heart Disease Risk: 3.24
HDL: 54 mg/dL (ref 45–65)
LDL Calculated: 90 mg/dL
Triglycerides: 154 mg/dL — ABNORMAL HIGH (ref 10–150)
VLDL: 31 (ref 0–40)

## 2015-02-02 LAB — COMPREHENSIVE METABOLIC PANEL
ALT: 14 U/L (ref 0–55)
AST (SGOT): 18 U/L (ref 10–42)
Albumin/Globulin Ratio: 1.28 Ratio (ref 0.70–1.50)
Albumin: 4.1 gm/dL (ref 3.5–5.0)
Alkaline Phosphatase: 79 U/L (ref 40–145)
Anion Gap: 15.2 mMol/L (ref 7.0–18.0)
BUN / Creatinine Ratio: 13.4 Ratio (ref 10.0–30.0)
BUN: 11 mg/dL (ref 7–22)
Bilirubin, Total: 0.8 mg/dL (ref 0.1–1.2)
CO2: 24.1 mMol/L (ref 20.0–30.0)
Calcium: 9.8 mg/dL (ref 8.5–10.5)
Chloride: 104 mMol/L (ref 98–110)
Creatinine: 0.82 mg/dL (ref 0.60–1.20)
EGFR: >60 mL/min/{1.73_m2}
Globulin: 3.2 gm/dL (ref 2.0–4.0)
Glucose: 117 mg/dL — ABNORMAL HIGH (ref 70–99)
Osmolality Calc: 278 mOsm/kg (ref 275–300)
Potassium: 4.3 mMol/L (ref 3.5–5.3)
Protein, Total: 7.3 gm/dL (ref 6.0–8.3)
Sodium: 139 mMol/L (ref 136–147)

## 2015-02-02 LAB — TSH: TSH: 1.75 u[IU]/mL (ref 0.40–4.20)

## 2015-02-02 LAB — HEMOGLOBIN A1C: Hgb A1C, %: 5.6 %

## 2015-02-02 LAB — T3, FREE: T3, Free: 2.7 pg/mL (ref 1.71–3.71)

## 2015-02-10 ENCOUNTER — Ambulatory Visit
Admission: RE | Admit: 2015-02-10 | Discharge: 2015-02-10 | Disposition: A | Discharge status: Home / Self Care | Payer: Medicare PPO | Source: Ambulatory Visit | Attending: Orthopaedic Surgery | Admitting: Orthopaedic Surgery

## 2015-02-10 DIAGNOSIS — S62617D Displaced fracture of proximal phalanx of left little finger, subsequent encounter for fracture with routine healing: Secondary | ICD-10-CM | POA: Insufficient documentation

## 2015-02-10 DIAGNOSIS — X58XXXD Exposure to other specified factors, subsequent encounter: Secondary | ICD-10-CM | POA: Insufficient documentation

## 2015-02-10 DIAGNOSIS — S62647D Nondisplaced fracture of proximal phalanx of left little finger, subsequent encounter for fracture with routine healing: Secondary | ICD-10-CM

## 2015-02-13 ENCOUNTER — Ambulatory Visit (INDEPENDENT_AMBULATORY_CARE_PROVIDER_SITE_OTHER): Payer: Medicare PPO | Admitting: Orthopaedic Surgery

## 2015-02-13 ENCOUNTER — Encounter (INDEPENDENT_AMBULATORY_CARE_PROVIDER_SITE_OTHER): Payer: Self-pay | Admitting: Orthopaedic Surgery

## 2015-02-13 VITALS — BP 134/84 | HR 76 | Ht 66.0 in | Wt 208.0 lb

## 2015-02-13 DIAGNOSIS — Z87891 Personal history of nicotine dependence: Secondary | ICD-10-CM

## 2015-02-13 DIAGNOSIS — S62647D Nondisplaced fracture of proximal phalanx of left little finger, subsequent encounter for fracture with routine healing: Secondary | ICD-10-CM

## 2015-02-13 DIAGNOSIS — W108XXD Fall (on) (from) other stairs and steps, subsequent encounter: Secondary | ICD-10-CM

## 2015-02-13 NOTE — Progress Notes (Signed)
Progress Note      Chief Complaint   Patient presents with   . Finger Injury     Nondisp fx of proximal phalanx of left little finger with routine heal       Subjective/HPI Comments:    Patient is a  69 y.o. female who presents for follow up on left hand small finer fracture that occurred when she tripped down her outdoor stairs. Pt states her hand is improving. She has been soaking her hand in warm soapy water a couple times a day and she states this has helped with the pain. Patient has been very reticent to move her finger but most of her pain is pretty much resolved    Date of Injury: 01/20/2015  Date of Surgery: n/a  Date Last Seen: 02/01/2015    Dominant Side: Right        Pain level today on a scale of 0-10, with 10 being the highest, is 2    Patient lives with spouse.  Patient is retired.    Outpatient Prescriptions Marked as Taking for the 02/13/15 encounter (Office Visit) with Areatha Keas, MD   Medication Sig Dispense Refill   . amitriptyline (ELAVIL) 10 MG tablet Take 1 tablet (10 mg total) by mouth nightly as needed. 90 tablet 2   . amLODIPine-benazepril (LOTREL 5-20) 5-20 MG per capsule Take 1 capsule by mouth daily. 90 capsule 4   . aspirin 81 MG tablet Take 81 mg by mouth daily.     Marland Kitchen atorvastatin (LIPITOR) 20 MG tablet TAKE 1 TABLET BY MOUTH EVERY DAY 90 tablet 3   . b complex vitamins capsule Take 1 capsule by mouth daily.     . bimatoprost (LUMIGAN) 0.03 % ophthalmic drops Place 1 drop into both eyes nightly.     . brimonidine (ALPHAGAN) 0.2 % ophthalmic solution Place 1 drop into both eyes 2 (two) times daily.     . Calcium Carbonate-Vitamin D (CALTRATE 600+D PO) Take 1 tablet by mouth daily.     . Cetirizine HCl (ZYRTEC PO) Take 10 mg by mouth as needed.      . Cholecalciferol (VITAMIN D) 1000 UNIT tablet Take 1,000 Units by mouth daily.     Marland Kitchen CO-ENZYME Q-10 PO Take 300 mg by mouth daily.     . dorzolamide-timolol (COSOPT) 22.3-6.8 MG/ML ophthalmic solution Apply 1 drop to eye 2 (two) times  daily.     Marland Kitchen FLUoxetine (PROZAC) 10 MG capsule TAKE 1 TABLET BY MOUTH EVERY DAY 90 capsule 3   . ipratropium (ATROVENT) 0.06 % nasal spray 2 sprays by Nasal route 3 (three) times daily as needed for Rhinitis. 15 mL 5   . latanoprost (XALATAN) 0.005 % ophthalmic solution      . Multiple Vitamin (MULTIVITAMIN) tablet Take 1 tablet by mouth daily.     . nabumetone (RELAFEN) 500 MG tablet Take 2 tablets (1,000 mg total) by mouth daily. 60 tablet 3   . Omega-3 Fatty Acids (FISH OIL BURP-LESS) 1000 MG Cap Take 1 capsule by mouth daily.     Marland Kitchen omeprazole (PRILOSEC) 20 MG capsule TAKE ONE CAPSULE BY MOUTH ONCE DAILY 90 capsule 3       Review of Systems:  Constitutional: Positive for activity change.        Unable to use left hand. Currently in a splint from the ER   HENT: Positive for hearing loss.    Eyes: Positive for visual disturbance.  Glaucoma in right eye   Respiratory: Negative for shortness of breath.    Cardiovascular: Negative for chest pain, palpitations and leg swelling.   Gastrointestinal: Negative for abdominal pain and diarrhea.   Genitourinary: Negative for urgency.   Musculoskeletal: Positive for joint swelling. Negative for gait problem.        Hand and fingers swollen   Neurological: Negative for seizures, syncope and headaches.   Psychiatric/Behavioral: Negative for dysphoric mood. The patient is nervous/anxious.         Pt states she is anxious about her injuries       Information above has been gathered by office staff and reviewed and amended by me as needed.  The following portions of the patient's history were reviewed and updated as appropriate: allergies, current medications, past family history, past medical history, past social history, past surgical history, and problem list.    Objective/ Physical Exam:    BP 134/84 mmHg  Pulse 76  Ht 1.676 m (5\' 6" )  Wt 94.348 kg (208 lb)  BMI 33.59 kg/m2  Patient is alert and oriented.   Examination of the left hand shows good clinical alignment  skins without lesions distal neurovascular status remains intact     Test Results:  Xrays 02/10/2015 at [facility] repeat x-rays show no change in alignment early callus formation minimally displaced fracture proximal phalanx left small finger        Orders:    Orders Placed This Encounter   Procedures   . X-ray hand left PA lateral and oblique     Standing Status: Future      Number of Occurrences:       Standing Expiration Date: 02/13/2016     Order Specific Question:  Reason for Exam:     Answer:  fx         Assessment: Distal phalanx fracture left small finger with normal healing    1. Nondisp fx of proximal phalanx of left little finger with routine heal        Plan:    The patient has been informed of all ordered tests and/or consults, if applicable.  All patient concerns and questions have been addressed.  We'll switch the patient out of her splint and buddy taping with [place] and increase activity levels as tolerated  Work Status:  Follow up: 2 weeks repeat x-rays at that time for final visit

## 2015-03-03 ENCOUNTER — Ambulatory Visit
Admission: RE | Admit: 2015-03-03 | Discharge: 2015-03-03 | Disposition: A | Discharge status: Home / Self Care | Payer: Medicare PPO | Source: Ambulatory Visit | Attending: Orthopaedic Surgery | Admitting: Orthopaedic Surgery

## 2015-03-03 ENCOUNTER — Encounter (INDEPENDENT_AMBULATORY_CARE_PROVIDER_SITE_OTHER): Payer: Self-pay

## 2015-03-03 DIAGNOSIS — S62647D Nondisplaced fracture of proximal phalanx of left little finger, subsequent encounter for fracture with routine healing: Secondary | ICD-10-CM | POA: Insufficient documentation

## 2015-03-03 DIAGNOSIS — X58XXXD Exposure to other specified factors, subsequent encounter: Secondary | ICD-10-CM | POA: Insufficient documentation

## 2015-03-06 ENCOUNTER — Ambulatory Visit (INDEPENDENT_AMBULATORY_CARE_PROVIDER_SITE_OTHER): Payer: Medicare PPO | Admitting: Orthopaedic Surgery

## 2015-03-06 ENCOUNTER — Encounter (INDEPENDENT_AMBULATORY_CARE_PROVIDER_SITE_OTHER): Payer: Self-pay | Admitting: Orthopaedic Surgery

## 2015-03-06 VITALS — BP 138/88 | HR 72 | Ht 66.0 in | Wt 208.0 lb

## 2015-03-06 DIAGNOSIS — W108XXD Fall (on) (from) other stairs and steps, subsequent encounter: Secondary | ICD-10-CM

## 2015-03-06 DIAGNOSIS — Z87891 Personal history of nicotine dependence: Secondary | ICD-10-CM

## 2015-03-06 DIAGNOSIS — S62647D Nondisplaced fracture of proximal phalanx of left little finger, subsequent encounter for fracture with routine healing: Secondary | ICD-10-CM

## 2015-03-06 NOTE — Progress Notes (Signed)
Progress Note      Chief Complaint   Patient presents with   . Finger Injury     Nondisp fx of proximal phalanx of left little finger with routine heal       Subjective/HPI Comments:    Patient is a  69 y.o. female who presents for follow up on left hand small finer fracture that occurred when she tripped down her outdoor stairs. Pt states her hand is improving. She has been soaking her hand in warm soapy water a couple times a day and she states this has helped with the pain. Patient has been very reticent to move her finger. Pt has been trying to uise her finger more since the last visit, but states this is increasing her pain from a 2 to a 4. States she also has a burning sensation down throught the finger and into her wrist which she never had before.      Date of Injury: 01/20/2015  Date of Surgery: n/a  Date Last Seen: 02/13/2015    Dominant Side: Right       Pain level today on a scale of 0-10, with 10 being the highest, is 4    Patient lives with husband  Patient is retired    Outpatient Prescriptions Marked as Taking for the 03/06/15 encounter (Office Visit) with Areatha Keas, MD   Medication Sig Dispense Refill   . amitriptyline (ELAVIL) 10 MG tablet Take 1 tablet (10 mg total) by mouth nightly as needed. 90 tablet 2   . amLODIPine-benazepril (LOTREL 5-20) 5-20 MG per capsule Take 1 capsule by mouth daily. 90 capsule 4   . aspirin 81 MG tablet Take 81 mg by mouth daily.     Marland Kitchen atorvastatin (LIPITOR) 20 MG tablet TAKE 1 TABLET BY MOUTH EVERY DAY 90 tablet 3   . b complex vitamins capsule Take 1 capsule by mouth daily.     . bimatoprost (LUMIGAN) 0.03 % ophthalmic drops Place 1 drop into both eyes nightly.     . brimonidine (ALPHAGAN) 0.2 % ophthalmic solution Place 1 drop into both eyes 2 (two) times daily.     . Calcium Carbonate-Vitamin D (CALTRATE 600+D PO) Take 1 tablet by mouth daily.     . Cetirizine HCl (ZYRTEC PO) Take 10 mg by mouth as needed.      . Cholecalciferol (VITAMIN D) 1000 UNIT tablet  Take 1,000 Units by mouth daily.     Marland Kitchen CO-ENZYME Q-10 PO Take 300 mg by mouth daily.     . dorzolamide-timolol (COSOPT) 22.3-6.8 MG/ML ophthalmic solution Apply 1 drop to eye 2 (two) times daily.     Marland Kitchen FLUoxetine (PROZAC) 10 MG capsule TAKE 1 TABLET BY MOUTH EVERY DAY 90 capsule 3   . ipratropium (ATROVENT) 0.06 % nasal spray 2 sprays by Nasal route 3 (three) times daily as needed for Rhinitis. 15 mL 5   . latanoprost (XALATAN) 0.005 % ophthalmic solution      . Multiple Vitamin (MULTIVITAMIN) tablet Take 1 tablet by mouth daily.     . nabumetone (RELAFEN) 500 MG tablet Take 2 tablets (1,000 mg total) by mouth daily. 60 tablet 3   . Omega-3 Fatty Acids (FISH OIL BURP-LESS) 1000 MG Cap Take 1 capsule by mouth daily.     Marland Kitchen omeprazole (PRILOSEC) 20 MG capsule TAKE ONE CAPSULE BY MOUTH ONCE DAILY 90 capsule 3       Review of Systems:  Constitutional: Positive for activity change.  Unable to use left hand. Currently in a splint from the ER   HENT: Positive for hearing loss.    Eyes: Positive for visual disturbance.        Glaucoma in right eye   Respiratory: Negative for shortness of breath.    Cardiovascular: Negative for chest pain, palpitations and leg swelling.   Gastrointestinal: Negative for abdominal pain and diarrhea.   Genitourinary: Negative for urgency.   Musculoskeletal: Positive for joint swelling. Negative for gait problem.        Hand and fingers swollen   Neurological: Negative for seizures, syncope and headaches.   Psychiatric/Behavioral: Negative for dysphoric mood. The patient is nervous/anxious.         Pt states she is anxious about her injuries     Information above has been gathered by office staff and reviewed and amended by me as needed.  The following portions of the patient's history were reviewed and updated as appropriate: allergies, current medications, past family history, past medical history, past social history, past surgical history, and problem list.    Objective/ Physical  Exam:    BP 138/88 mmHg  Pulse 72  Ht 1.676 m (5\' 6" )  Wt 94.348 kg (208 lb)  BMI 33.59 kg/m2  Patient is alert and oriented.   Examination of the left hand shows still some mild swelling proximal phalanx of the small finger clinically is very well aligned no changes in color vascular pattern distal neurovascular status is intact she's been reticent to move the finger and is expected all joints are stiff but has full flexion and extensor power throughout     Test Results:  Repeat x-rays done last Friday in the hospital show no change in alignment early callus formation        Orders:    No orders of the defined types were placed in this encounter.         Assessment: Proximal phalanx fracture left small finger healed    1. Nondisp fx of proximal phalanx of left little finger with routine heal        Plan:    The patient has been informed of all ordered tests and/or consults, if applicable.  All patient concerns and questions have been addressed.  Patient is encouraged to increase use of the finger discussed exercise program with therapy she does not want to go to hand therapy  Work Status:  Follow up: When necessary

## 2015-03-10 ENCOUNTER — Telehealth (INDEPENDENT_AMBULATORY_CARE_PROVIDER_SITE_OTHER): Payer: Self-pay | Admitting: Orthopaedic Surgery

## 2015-03-10 NOTE — Telephone Encounter (Signed)
Patient called asking for referral to PT. Says finger is still giving her problems.

## 2015-03-13 ENCOUNTER — Telehealth (RURAL_HEALTH_CENTER): Payer: Self-pay | Admitting: Family Medicine

## 2015-03-13 NOTE — Telephone Encounter (Signed)
Patient came in about her finger today.  She has been seeing dr Dulcy Fanny for this but is wondering if dr Hyacinth Meeker could do referral for phy therapy??  Please give patient a call back.  Thanks .  Dr Hyacinth Meeker is pcp.

## 2015-03-13 NOTE — Telephone Encounter (Signed)
Please advise on patients request below.//bc

## 2015-03-14 NOTE — Telephone Encounter (Signed)
Please have Dr. Dulcy Fanny  make a physical therapy referral as he is following her and I really don't know what's going on with her finger at present.

## 2015-03-20 ENCOUNTER — Telehealth (INDEPENDENT_AMBULATORY_CARE_PROVIDER_SITE_OTHER): Payer: Self-pay

## 2015-03-20 NOTE — Telephone Encounter (Signed)
Spoke with patient who states she is requesting therapy because she does not feel the condition of her finger is improving. She is still having some swelling, and has limited ROM. Advised pt to alternate tylenol and NSAID q4h for pain and inflammation, and to continue with stretches and her routine daily activities to help regain the ROM in her finger. Pt is to call us after she returns from vacation if she does not see any improvement.

## 2015-03-20 NOTE — Telephone Encounter (Signed)
No ot required.vb

## 2015-03-20 NOTE — Telephone Encounter (Signed)
Pt requesting an order for PT for her hand. States she would feel better if someone was telling her specifically how to exercise and stretch her hand.

## 2015-03-20 NOTE — Telephone Encounter (Signed)
No answer, no VM. Will call again on Wednesday. Pre Dr. Seth Bake, therapy not needed.

## 2015-04-02 ENCOUNTER — Other Ambulatory Visit (RURAL_HEALTH_CENTER): Payer: Self-pay | Admitting: Family Medicine

## 2015-04-10 ENCOUNTER — Encounter (INDEPENDENT_AMBULATORY_CARE_PROVIDER_SITE_OTHER): Payer: Self-pay | Admitting: Orthopaedic Surgery

## 2015-04-10 ENCOUNTER — Ambulatory Visit (INDEPENDENT_AMBULATORY_CARE_PROVIDER_SITE_OTHER): Payer: Medicare PPO | Admitting: Orthopaedic Surgery

## 2015-04-10 VITALS — BP 126/84 | Ht 66.0 in | Wt 206.8 lb

## 2015-04-10 DIAGNOSIS — W108XXD Fall (on) (from) other stairs and steps, subsequent encounter: Secondary | ICD-10-CM

## 2015-04-10 DIAGNOSIS — Z87891 Personal history of nicotine dependence: Secondary | ICD-10-CM

## 2015-04-10 DIAGNOSIS — S62647D Nondisplaced fracture of proximal phalanx of left little finger, subsequent encounter for fracture with routine healing: Secondary | ICD-10-CM

## 2015-04-10 NOTE — Progress Notes (Signed)
Progress Note      Chief Complaint   Patient presents with   . Hand Problem     L hand small finger pain and stiffness    doi: 01-20-15        Subjective/HPI Comments:    Patient is a  69 y.o. female who presents with Left hand small finger fx  Doi: 01-20-15 patient returns today now almost 8 weeks out or more from proximal phalanx fracture small finger left nondominant hand still with some stiffness    Date of Injury:01-20-15  Date of Surgery: n/a  Date Last Seen:    Dominant Side:R         Pain level today on a scale of 0-10, with 10 being the highest, is     Patient lives with  Patient works at    Outpatient Prescriptions Marked as Taking for the 04/10/15 encounter (Office Visit) with Areatha Keas, MD   Medication Sig Dispense Refill   . amitriptyline (ELAVIL) 10 MG tablet Take 1 tablet (10 mg total) by mouth nightly as needed. 90 tablet 2   . amLODIPine-benazepril (LOTREL 5-20) 5-20 MG per capsule Take 1 capsule by mouth daily. 90 capsule 4   . aspirin 81 MG tablet Take 81 mg by mouth daily.     Marland Kitchen atorvastatin (LIPITOR) 20 MG tablet TAKE 1 TABLET BY MOUTH EVERY DAY 90 tablet 3   . b complex vitamins capsule Take 1 capsule by mouth daily.     . bimatoprost (LUMIGAN) 0.03 % ophthalmic drops Place 1 drop into both eyes nightly.     . brimonidine (ALPHAGAN) 0.2 % ophthalmic solution Place 1 drop into both eyes 2 (two) times daily.     . Calcium Carbonate-Vitamin D (CALTRATE 600+D PO) Take 1 tablet by mouth daily.     . Cetirizine HCl (ZYRTEC PO) Take 10 mg by mouth as needed.      . Cholecalciferol (VITAMIN D) 1000 UNIT tablet Take 1,000 Units by mouth daily.     Marland Kitchen CO-ENZYME Q-10 PO Take 300 mg by mouth daily.     . dorzolamide-timolol (COSOPT) 22.3-6.8 MG/ML ophthalmic solution Apply 1 drop to eye 2 (two) times daily.     Marland Kitchen FLUoxetine (PROZAC) 10 MG capsule TAKE ONE CAPSULE BY MOUTH EVERY DAY 90 capsule 3   . ipratropium (ATROVENT) 0.06 % nasal spray 2 sprays by Nasal route 3 (three) times daily as needed for  Rhinitis. 15 mL 5   . latanoprost (XALATAN) 0.005 % ophthalmic solution      . Multiple Vitamin (MULTIVITAMIN) tablet Take 1 tablet by mouth daily.     . nabumetone (RELAFEN) 500 MG tablet Take 2 tablets (1,000 mg total) by mouth daily. 60 tablet 3   . Omega-3 Fatty Acids (FISH OIL BURP-LESS) 1000 MG Cap Take 1 capsule by mouth daily.     Marland Kitchen omeprazole (PRILOSEC) 20 MG capsule TAKE ONE CAPSULE BY MOUTH ONCE DAILY 90 capsule 3       Review of Systems:      Information above has been gathered by office staff and reviewed and amended by me as needed.  The following portions of the patient's history were reviewed and updated as appropriate: allergies, current medications, past family history, past medical history, past social history, past surgical history, and problem list.    Objective/ Physical Exam:    BP 126/84 mmHg  Ht 1.676 m (5\' 6" )  Wt 93.804 kg (206 lb 12.8 oz)  BMI 33.39 kg/m2  Patient is alert and oriented. Examination of the left hand shows poor flexion at the MCP with minimally 30 has probably 60 of flexion at the PIP no longer has any pain at the fracture site still some resultant swelling                    Test Results:          Orders:    Orders Placed This Encounter   Procedures   . Referral to Occupational Therapy     Referral Priority:  Routine     Referral Type:  Occupational Therapy     Referral Reason:  Specialty Services Required     Requested Specialty:  Occupational Therapy     Number of Visits Requested:  1         Assessment:    1. Closed nondisplaced fracture of proximal phalanx of left little finger with routine healing, subsequent encounter        Plan:    The patient has been informed of all ordered tests and/or consults, if applicable.  All patient concerns and questions have been addressed.  She'll be sent to formal supervised occupational therapy as she has not been able to accomplish this on her own  Work Status:  Follow up: When necessary

## 2015-05-01 ENCOUNTER — Other Ambulatory Visit
Admission: RE | Admit: 2015-05-01 | Discharge: 2015-05-01 | Disposition: A | Discharge status: Home / Self Care | Payer: Medicare PPO | Source: Ambulatory Visit | Attending: Family Medicine | Admitting: Family Medicine

## 2015-05-01 ENCOUNTER — Encounter (RURAL_HEALTH_CENTER): Payer: Self-pay | Admitting: Family Medicine

## 2015-05-01 ENCOUNTER — Ambulatory Visit (RURAL_HEALTH_CENTER): Payer: Medicare PPO | Admitting: Family Medicine

## 2015-05-01 VITALS — BP 107/65 | HR 68 | Temp 98.0°F | Resp 18 | Ht 66.0 in | Wt 206.0 lb

## 2015-05-01 DIAGNOSIS — G2581 Restless legs syndrome: Secondary | ICD-10-CM

## 2015-05-01 DIAGNOSIS — R7303 Prediabetes: Secondary | ICD-10-CM

## 2015-05-01 DIAGNOSIS — E0500 Thyrotoxicosis with diffuse goiter without thyrotoxic crisis or storm: Secondary | ICD-10-CM

## 2015-05-01 DIAGNOSIS — I10 Essential (primary) hypertension: Secondary | ICD-10-CM

## 2015-05-01 DIAGNOSIS — E785 Hyperlipidemia, unspecified: Secondary | ICD-10-CM

## 2015-05-01 LAB — COMPREHENSIVE METABOLIC PANEL
ALT: 19 U/L (ref 0–55)
AST (SGOT): 19 U/L (ref 10–42)
Albumin/Globulin Ratio: 1.34 Ratio (ref 0.70–1.50)
Albumin: 4.3 gm/dL (ref 3.5–5.0)
Alkaline Phosphatase: 65 U/L (ref 40–145)
Anion Gap: 15.3 mMol/L (ref 7.0–18.0)
BUN / Creatinine Ratio: 14.3 Ratio (ref 10.0–30.0)
BUN: 11 mg/dL (ref 7–22)
Bilirubin, Total: 0.7 mg/dL (ref 0.1–1.2)
CO2: 23.3 mMol/L (ref 20.0–30.0)
Calcium: 9.9 mg/dL (ref 8.5–10.5)
Chloride: 104 mMol/L (ref 98–110)
Creatinine: 0.77 mg/dL (ref 0.60–1.20)
EGFR: >60 mL/min/{1.73_m2}
Globulin: 3.2 gm/dL (ref 2.0–4.0)
Glucose: 108 mg/dL — ABNORMAL HIGH (ref 70–99)
Osmolality Calc: 276 mOsm/kg (ref 275–300)
Potassium: 4.6 mMol/L (ref 3.5–5.3)
Protein, Total: 7.5 gm/dL (ref 6.0–8.3)
Sodium: 138 mMol/L (ref 136–147)

## 2015-05-01 LAB — HEMOGLOBIN A1C: Hgb A1C, %: 5.3 %

## 2015-05-01 LAB — CBC AND DIFFERENTIAL
Basophils %: 1.1 % (ref 0.0–3.0)
Basophils Absolute: 0.1 10*3/uL (ref 0.0–0.3)
Eosinophils %: 3.2 % (ref 0.0–7.0)
Eosinophils Absolute: 0.2 10*3/uL (ref 0.0–0.8)
Hematocrit: 41.5 % (ref 36.0–48.0)
Hemoglobin: 14 gm/dL (ref 12.0–16.0)
Lymphocytes Absolute: 1.7 10*3/uL (ref 0.6–5.1)
Lymphocytes: 36.2 % (ref 15.0–46.0)
MCH: 32 pg (ref 28–35)
MCHC: 34 gm/dL (ref 32–36)
MCV: 94 fL (ref 80–100)
MPV: 9.4 fL (ref 6.0–10.0)
Monocytes Absolute: 0.5 10*3/uL (ref 0.1–1.7)
Monocytes: 9.6 % (ref 3.0–15.0)
Neutrophils %: 49.9 % (ref 42.0–78.0)
Neutrophils Absolute: 2.4 10*3/uL (ref 1.7–8.6)
PLT CT: 309 10*3/uL (ref 130–440)
RBC: 4.4 10*6/uL (ref 3.80–5.00)
RDW: 11.4 % (ref 11.0–14.0)
WBC: 4.8 10*3/uL (ref 4.0–11.0)

## 2015-05-01 LAB — LIPID PANEL
Cholesterol: 195 mg/dL (ref 75–199)
Coronary Heart Disease Risk: 3
HDL: 65 mg/dL (ref 45–65)
LDL Calculated: 104 mg/dL
Triglycerides: 131 mg/dL (ref 10–150)
VLDL: 26 (ref 0–40)

## 2015-05-01 LAB — TSH: TSH: 2.03 u[IU]/mL (ref 0.40–4.20)

## 2015-05-01 LAB — T4, FREE: T4 Free: 0.97 ng/dL (ref 0.70–1.48)

## 2015-05-01 LAB — T3, FREE: T3, Free: 2.6 pg/mL (ref 1.71–3.71)

## 2015-05-01 NOTE — Progress Notes (Signed)
Subjective:    Patient ID: Brittany Berry is a 69 y.o. female.    HPI Brittany Berry returns today for medical management. She seems to be doing fairly well with the exception of her left hand. She continues to have pain and stiffness in her fourth and fifth fingers of the left hand. She fell and fractured the proximal phalanx of her fifth finger in July. She currently is getting occupational therapy for her hand. Unfortunately there remains some swelling and stiffness and discomfort specifically in her fifth finger but also some in her fourth finger.  She is here mainly for medical management. Problem list includes hypertension, prediabetes, history of Graves' disease, dyslipidemia and RLS. She reports no problems with any of these.  She thinks she is going get a flu shot [facility] because she is concerned about what it would cost here in the office and we can give her a definite Price.    The following portions of the patient's history were reviewed and updated as appropriate: allergies, current medications, past family history, past medical history, past social history, past surgical history and problem list.    Review of Systems   All other systems reviewed and are negative.          Objective:    Physical Exam   Constitutional: She is oriented to person, place, and time. She appears well-developed. No distress.   Obese     HENT:   Head: Normocephalic and atraumatic.   Right Ear: External ear normal.   Left Ear: External ear normal.   Nose: Nose normal.   Mouth/Throat: Oropharynx is clear and moist. No oropharyngeal exudate.   Eyes: Conjunctivae and EOM are normal. Pupils are equal, round, and reactive to light. Right eye exhibits no discharge. Left eye exhibits no discharge. No scleral icterus.   Neck: Normal range of motion. Neck supple. No JVD present. No tracheal deviation present. No thyromegaly present.   Cardiovascular: Normal rate, regular rhythm, normal heart sounds and intact distal pulses.   Exam reveals no gallop and no friction rub.    No murmur heard.  Pulmonary/Chest: Effort normal and breath sounds normal. No stridor. No respiratory distress. She has no wheezes. She has no rales. She exhibits no tenderness.   Abdominal: Soft. Bowel sounds are normal. She exhibits no distension and no mass. There is no tenderness. There is no rebound and no guarding.   Musculoskeletal: Normal range of motion. She exhibits no edema or tenderness.   Left hand is examined. There is some mild swelling in most of her fingers on the left hand but specifically the fourth and fifth finger. The skin has a shiny smooth appearance consistent with RSD. She definitely is not able to flex her fifth finger more than 80  at the PIP joint.   Lymphadenopathy:     She has no cervical adenopathy.   Neurological: She is alert and oriented to person, place, and time. She has normal reflexes. No cranial nerve deficit. Coordination normal.   Skin: Skin is warm and dry. No rash noted. She is not diaphoretic. No erythema. No pallor.   Psychiatric: She has a normal mood and affect. Her behavior is normal. Judgment and thought content normal.   Nursing note and vitals reviewed.          Assessment:       1. Essential hypertension, benign    2. Pre-diabetes    3. Graves disease    4. Dyslipidemia  5. Restless legs syndrome (RLS)          Plan:       1. Continue with occupational therapy regarding her hand.  2. Fasting labs  3. Patient will get flu vaccination health department. She's already had both of her pneumococcal vaccines.  4.The patient was recommended a whole food no added sugar diet along with an exercise program 4-5 days a week for 45 minutes daily to promote weight loss. They were also advised to eat 3-4 small meals a day as well as not eating late at night.  5. Follow-up in 4 months. Sooner if needed.

## 2015-05-07 ENCOUNTER — Encounter (RURAL_HEALTH_CENTER): Payer: Self-pay | Admitting: Family Medicine

## 2015-05-08 ENCOUNTER — Other Ambulatory Visit (RURAL_HEALTH_CENTER): Payer: Self-pay | Admitting: Family Medicine

## 2015-05-08 DIAGNOSIS — S8012XA Contusion of left lower leg, initial encounter: Secondary | ICD-10-CM

## 2015-05-08 MED ORDER — NABUMETONE 500 MG PO TABS
1000.0000 mg | ORAL_TABLET | Freq: Every day | ORAL | Status: DC
Start: 2015-05-08 — End: 2016-10-31

## 2015-05-23 ENCOUNTER — Encounter (RURAL_HEALTH_CENTER): Payer: Self-pay

## 2015-06-12 ENCOUNTER — Ambulatory Visit (RURAL_HEALTH_CENTER): Payer: Medicare PPO | Admitting: Family Medicine

## 2015-06-12 ENCOUNTER — Emergency Department: Payer: Medicare PPO

## 2015-06-12 ENCOUNTER — Telehealth (RURAL_HEALTH_CENTER): Payer: Self-pay

## 2015-06-12 ENCOUNTER — Emergency Department
Admission: EM | Admit: 2015-06-12 | Discharge: 2015-06-12 | Disposition: A | Discharge status: Home / Self Care | Payer: Medicare PPO | Attending: Emergency Medicine | Admitting: Emergency Medicine

## 2015-06-12 DIAGNOSIS — W228XXA Striking against or struck by other objects, initial encounter: Secondary | ICD-10-CM | POA: Insufficient documentation

## 2015-06-12 DIAGNOSIS — S60222A Contusion of left hand, initial encounter: Secondary | ICD-10-CM | POA: Insufficient documentation

## 2015-06-12 NOTE — Telephone Encounter (Signed)
Patient was seen in ER .//BC

## 2015-06-12 NOTE — ED Provider Notes (Signed)
[facility]   EMERGENCY DEPARTMENT HISTORY AND PHYSICAL EXAM    Date: 06/12/2015  Patient Name: Brittany Berry,Brittany Berry  Attending Physician: Otelia Sergeant, MD  Patient DOB:  11/23/1945  MRN:  [id number]  Room:  ED9/ED9-A    Patient was evaluated by ED physician,B. Marchelle Folks, MD at 11:17 AM    History     Chief Complaint   Patient presents with   . Hand Injury     LT       HPI:  The patient Brittany Berry, is a 69 y.o. female who presents with chief complaint of fell 2 days ago and hit left hand  C/o pain  No other injury    Location- left hand  Severity- mild  Duration- 2 days  Radiation -   Character- pain  Onset- sudden  Modifying-    Associated symptoms-     PCP:  Marchia Meiers, MD      Past Medical History       Past Medical History   Diagnosis Date   . Arthritis    . Unspecified glaucoma    . Mixed hyperlipidemia    . Essential hypertension, benign    . Tendonitis    . 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 Restless legs syndrome (RLS)    . Depression    . High cholesterol    . Diabetes mellitus      [facility]- NO MEDS REQUIRED AT THIS TIME.   Marland Kitchen Dyslipidemia 03/30/2013   . Graves disease 03/30/2013   . Osteoarthritis 03/30/2013   . Chronic headache 03/30/2013   . GERD (gastroesophageal reflux disease) 03/30/2013   . Osteopenia 03/30/2013   . Pre-diabetes 02/23/2014         Past Surgical History       Past Surgical History   Procedure Laterality Date   . 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 Dilation and curettage of uterus     . Excision, bone spur       left little toe    . Colonoscopy  02/11/2013     Procedure: COLONOSCOPY;  Surgeon: Dineen Kid, MD;  Location: [facility];  Service: Gastroenterology;  Laterality: N/A;   . Colonoscopy  08/14     - colonoscopy   . Cataract extraction w/ intraocular lens implant  08/2013     right eye         Family History    Family History   Problem Relation Age of Onset   . Cancer Other      BREAST CANCER.   . Colon polyps Other     . Anesthesia problems Other    . Heart disease Mother    . Heart attack Father    . Asthma Brother    . Heart disease Maternal Grandfather    . Heart disease Paternal Grandfather    . Cancer Sister    . Arthritis Sister    . COPD Sister    . Diabetes Sister    . Vasculitis Brother        Social History    Social History     Social History   . Marital Status: Married     Spouse Name: N/A   . Number of Children: N/A   . Years of Education: N/A     Social History Main Topics   . Smoking status: Former Smoker  Quit date: 07/22/1982   . Smokeless tobacco: Never Used   . Alcohol Use: Yes      Comment: wine 3/week   . Drug Use: No   . Sexual Activity: Not Asked     Other Topics Concern   . None     Social History Narrative       Allergies    Allergies   Allergen Reactions   . Codeine Nausea Only   . Prednisone      Causes depression         Current/Home Medications    Current/Home Medications    AMITRIPTYLINE (ELAVIL) 10 MG TABLET    Take 1 tablet (10 mg total) by mouth nightly as needed.    AMLODIPINE-BENAZEPRIL (LOTREL 5-20) 5-20 MG PER CAPSULE    Take 1 capsule by mouth daily.    ASPIRIN 81 MG TABLET    Take 81 mg by mouth daily.    ATORVASTATIN (LIPITOR) 20 MG TABLET    TAKE 1 TABLET BY MOUTH EVERY DAY    B COMPLEX VITAMINS CAPSULE    Take 1 capsule by mouth daily.    BIMATOPROST (LUMIGAN) 0.03 % OPHTHALMIC DROPS    Place 1 drop into both eyes nightly.    BRIMONIDINE (ALPHAGAN) 0.2 % OPHTHALMIC SOLUTION    Place 1 drop into both eyes 2 (two) times daily.    CALCIUM CARBONATE-VITAMIN D (CALTRATE 600+D PO)    Take 1 tablet by mouth daily.    CETIRIZINE HCL (ZYRTEC PO)    Take 10 mg by mouth as needed.     CHOLECALCIFEROL (VITAMIN D) 1000 UNIT TABLET    Take 1,000 Units by mouth daily.    CO-ENZYME Q-10 PO    Take 300 mg by mouth daily.    DORZOLAMIDE-TIMOLOL (COSOPT) 22.3-6.8 MG/ML OPHTHALMIC SOLUTION    Apply 1 drop to eye 2 (two) times daily.    FLUOXETINE (PROZAC) 10 MG CAPSULE    TAKE ONE CAPSULE BY MOUTH EVERY  DAY    HYDROCODONE-ACETAMINOPHEN (NORCO) 5-325 MG PER TABLET    Take 1 tablet by mouth every 6 (six) hours as needed for Pain.    IPRATROPIUM (ATROVENT) 0.06 % NASAL SPRAY    2 sprays by Nasal route 3 (three) times daily as needed for Rhinitis.    LATANOPROST (XALATAN) 0.005 % OPHTHALMIC SOLUTION        MOMETASONE FUROATE (NASONEX NA)    50 mcg by Nasal route as needed.    MULTIPLE VITAMIN (MULTIVITAMIN) TABLET    Take 1 tablet by mouth daily.    NABUMETONE (RELAFEN) 500 MG TABLET    Take 2 tablets (1,000 mg total) by mouth daily.    OMEGA-3 FATTY ACIDS (FISH OIL BURP-LESS) 1000 MG CAP    Take 1 capsule by mouth daily.    OMEPRAZOLE (PRILOSEC) 20 MG CAPSULE    TAKE ONE CAPSULE BY MOUTH ONCE DAILY       Vital Signs     BP 135/80 mmHg  Pulse 61  Temp(Src) 98 F (36.7 C) (Tympanic)  Resp 18  Ht 1.676 m  Wt 92.987 kg  BMI 33.10 kg/m2  SpO2 96%  Patient Vitals for the past 24 hrs:   BP Temp Temp src Pulse Resp SpO2 Height Weight   06/12/15 1035 135/80 mmHg 98 F (36.7 C) Tympanic 61 18 96 % 1.676 m 92.987 kg         Review of Systems     Constitutional: No fever  Head: No headache    Musculoskeletal:   left hand abrasion/pain    No edema    No symptoms of DVT    Skin: no rashes or skin lesions.    Neurologic:no c/o weakness or paresthesia         All other systems reviewed and negative      Physical Exam     CONSTITUTIONAL:  Vital signs reviewed     Well appearing,  Patient appears comfortable    Alert and oriented X 3         UPPER EXTREMITY:   Minor healing abrasions and slight bruising of left hand ulnar aspect and 5th digit  No deformity   No other abnormality of LUE    NEURO: Normal motor, sensory of left hand    l    SPINE:      SKIN:  Warm,  Dry, Normal color, No rashes    LYMPHATIC:         ED Medication Orders     ED Medication Orders     None          Orders Placed During this Encounter     Orders Placed This Encounter   Procedures   . XR HAND LEFT PA LATERAL AND OBLIQUE       Diagnostic Study  Results     Labs     Results     ** No results found for the last 24 hours. **          Radiologic Studies  Radiology Results (24 Hour)     Procedure Component Value Units Date/Time    XR HAND LEFT PA LATERAL AND OBLIQUE [[id number]] Collected:  06/12/15 1130    Order Status:  Completed Updated:  06/12/15 1132    Narrative:      Clinical History:  Reason For Exam:  PAIN  Fall last evening. Patient has a previous fx to 5th digit that's shes in rehab for. Limited range of motion.     Examination:  AP, lateral and oblique views of the left hand.    Comparison:  May 03, 2015 and other prior studies.    Findings:  Bones are osteopenic.  There is continued healing of the proximal fifth phalangeal fracture.  No other acute fracture or  evidence of dislocation.      Impression:      Stable subacute fracture proximal fifth phalanx.    No acute fracture.    ReadingStation:[id number]      .    Clinical Course / MDM     Notes:     Consults:       Data Review     Nursing records reviewed and agree: Yes    Pulse Oximetry Analysis - Normal  Laboratory results reviewed by EDP: No    Radiologic study results reviewed by EDP: Yes    Rendering Provider: Beola Cord, MD      Monitors, EKG     Cardiac Monitor (interpreted by ED physician):      EKG (interpreted by ED physician):       Critical Care     Critical care exclusive of time spent performing procedures.    Total time:          Clinical Impression & Disposition     Clinical Impression:  1. Contusion of left hand, initial encounter        Disposition  ED Disposition     Discharge [facility] discharge to home/self care.  Condition at disposition: Stable            Prescriptions    New Prescriptions    No medications on file         Signed,  B. Marchelle Folks, MD  11:37 AM 06/12/2015                Beola Cord, MD  06/12/15 1137

## 2015-06-12 NOTE — Telephone Encounter (Signed)
Patient fell on Saturday and possible that she re injured her hand that she had broken before . Is requesting an order for a xray.//bc

## 2015-06-12 NOTE — Telephone Encounter (Signed)
Needs to be eval

## 2015-06-12 NOTE — Discharge Instructions (Signed)
Hand Contusion  You have a contusion. This is also called a bruise. There is swelling and some bleeding under the skin, but no broken bones. This injurygenerallytakes a few days to a few weeks to heal. During that time, the bruise will typically change in color fromreddish, to purple-blue, to greenish-yellow, then to yellow-brown.  Home care   Elevate the hand to reduce pain and swelling.As much as possible, sit or lie down with the hand raised about the level of your heart.This is especially important during the first 48 hours.   Ice the hand to help reduce pain and swelling.Wrap a cold source (ice packor ice cubes in a plastic bag) in a thin towel. Apply to the bruised area for 20 minutes every 1 to 2 hours the first day. Continue this 3 to 4 times a day until the pain and swelling goes away.   Unless another medication was prescribed, you can take acetaminophen, ibuprofen, or naproxento control pain. (If you have chronic liver or kidney disease or ever had a stomach ulcer or GI bleeding, talk with your doctor before using these medicines.)  Follow up  Follow up with your health care provider or our staff as advised. Call if you are not improving within1 to 2 weeks.  When to seek medical advice  Call your health care provider right away if you have any of the following:   Increased pain or swelling   Arm becomes cold, blue, numb or tingly   Signs of infection:Warmth, drainage, or increased redness or pain around the bruise   Inability to move the injured hand   Frequent bruising for unknown reasons   2000-2015 [employer] [address], [place] [address]. All rights reserved. This information is not intended as a substitute for professional medical care. Always follow your healthcare professional's instructions.

## 2015-06-12 NOTE — ED Notes (Signed)
FELL THIS PAST SAT NIGHT, RE-INJURING LT 5TH DIGIT/HAND. ABRASIONS/BRUISING NOTED. H/O LT 5TH FINGER INJURY/FX IN [place]

## 2015-06-22 ENCOUNTER — Other Ambulatory Visit (INDEPENDENT_AMBULATORY_CARE_PROVIDER_SITE_OTHER): Payer: Self-pay | Admitting: Family Medicine

## 2015-07-23 HISTORY — PX: CATARACT EXTRACTION W/PHACO: SHX586

## 2015-08-02 ENCOUNTER — Other Ambulatory Visit (RURAL_HEALTH_CENTER): Payer: Self-pay | Admitting: Family Medicine

## 2015-09-04 ENCOUNTER — Encounter (RURAL_HEALTH_CENTER): Payer: Self-pay | Admitting: Family Medicine

## 2015-09-04 ENCOUNTER — Other Ambulatory Visit
Admission: RE | Admit: 2015-09-04 | Discharge: 2015-09-04 | Disposition: A | Discharge status: Home / Self Care | Payer: No Typology Code available for payment source | Source: Ambulatory Visit | Attending: Family Medicine | Admitting: Family Medicine

## 2015-09-04 ENCOUNTER — Ambulatory Visit (RURAL_HEALTH_CENTER): Payer: No Typology Code available for payment source | Admitting: Family Medicine

## 2015-09-04 VITALS — BP 144/77 | HR 70 | Temp 99.0°F | Resp 18 | Wt 207.0 lb

## 2015-09-04 DIAGNOSIS — R350 Frequency of micturition: Secondary | ICD-10-CM

## 2015-09-04 DIAGNOSIS — I10 Essential (primary) hypertension: Secondary | ICD-10-CM

## 2015-09-04 DIAGNOSIS — L570 Actinic keratosis: Secondary | ICD-10-CM

## 2015-09-04 DIAGNOSIS — E0500 Thyrotoxicosis with diffuse goiter without thyrotoxic crisis or storm: Secondary | ICD-10-CM

## 2015-09-04 DIAGNOSIS — Z87891 Personal history of nicotine dependence: Secondary | ICD-10-CM

## 2015-09-04 DIAGNOSIS — R7303 Prediabetes: Secondary | ICD-10-CM

## 2015-09-04 DIAGNOSIS — F329 Major depressive disorder, single episode, unspecified: Secondary | ICD-10-CM

## 2015-09-04 DIAGNOSIS — J0100 Acute maxillary sinusitis, unspecified: Secondary | ICD-10-CM

## 2015-09-04 DIAGNOSIS — R3915 Urgency of urination: Secondary | ICD-10-CM

## 2015-09-04 DIAGNOSIS — K219 Gastro-esophageal reflux disease without esophagitis: Secondary | ICD-10-CM

## 2015-09-04 DIAGNOSIS — E785 Hyperlipidemia, unspecified: Secondary | ICD-10-CM

## 2015-09-04 LAB — VH POCT URINALYSIS (DIPSTICK)
Bilirubin, UA POCT: NEGATIVE
Glucose, UA POCT: NEGATIVE mg/dL
Ketones, UA POCT: NEGATIVE mg/dL
Nitrite, UA POCT: NEGATIVE
POCT Spec Gravity, UA: 1.005 (ref 1.001–1.035)
POCT pH, UA: 7.5 (ref 5–8)
Protein, UA POCT: NEGATIVE mg/dL
Urobilinogen, UA: 0.2 mg/dL

## 2015-09-04 LAB — CBC AND DIFFERENTIAL
Basophils %: 0.8 % (ref 0.0–3.0)
Basophils Absolute: 0 10*3/uL (ref 0.0–0.3)
Eosinophils %: 6.1 % (ref 0.0–7.0)
Eosinophils Absolute: 0.3 10*3/uL (ref 0.0–0.8)
Hematocrit: 43.6 % (ref 36.0–48.0)
Hemoglobin: 15 gm/dL (ref 12.0–16.0)
Lymphocytes Absolute: 1.8 10*3/uL (ref 0.6–5.1)
Lymphocytes: 36.3 % (ref 15.0–46.0)
MCH: 33 pg (ref 28–35)
MCHC: 34 gm/dL (ref 32–36)
MCV: 96 fL (ref 80–100)
MPV: 9.4 fL (ref 6.0–10.0)
Monocytes Absolute: 0.4 10*3/uL (ref 0.1–1.7)
Monocytes: 8.1 % (ref 3.0–15.0)
Neutrophils %: 48.7 % (ref 42.0–78.0)
Neutrophils Absolute: 2.4 10*3/uL (ref 1.7–8.6)
PLT CT: 336 10*3/uL (ref 130–440)
RBC: 4.54 10*6/uL (ref 3.80–5.00)
RDW: 11.4 % (ref 11.0–14.0)
WBC: 5 10*3/uL (ref 4.0–11.0)

## 2015-09-04 LAB — COMPREHENSIVE METABOLIC PANEL
ALT: 19 U/L (ref 0–55)
AST (SGOT): 22 U/L (ref 10–42)
Albumin/Globulin Ratio: 1.16 Ratio (ref 0.70–1.50)
Albumin: 4.4 gm/dL (ref 3.5–5.0)
Alkaline Phosphatase: 69 U/L (ref 40–145)
Anion Gap: 12.6 mMol/L (ref 7.0–18.0)
BUN / Creatinine Ratio: 15.5 Ratio (ref 10.0–30.0)
BUN: 13 mg/dL (ref 7–22)
Bilirubin, Total: 0.7 mg/dL (ref 0.1–1.2)
CO2: 27.9 mMol/L (ref 20.0–30.0)
Calcium: 10.2 mg/dL (ref 8.5–10.5)
Chloride: 104 mMol/L (ref 98–110)
Creatinine: 0.84 mg/dL (ref 0.60–1.20)
EGFR: >60 mL/min/{1.73_m2}
Globulin: 3.8 gm/dL (ref 2.0–4.0)
Glucose: 104 mg/dL — ABNORMAL HIGH (ref 70–99)
Osmolality Calc: 280 mOsm/kg (ref 275–300)
Potassium: 4.5 mMol/L (ref 3.5–5.3)
Protein, Total: 8.2 gm/dL (ref 6.0–8.3)
Sodium: 140 mMol/L (ref 136–147)

## 2015-09-04 LAB — TSH: TSH: 1.68 u[IU]/mL (ref 0.40–4.20)

## 2015-09-04 LAB — HEMOGLOBIN A1C: Hgb A1C, %: 5.4 %

## 2015-09-04 LAB — LIPID PANEL
Cholesterol: 196 mg/dL (ref 75–199)
Coronary Heart Disease Risk: 3.16
HDL: 62 mg/dL (ref 45–65)
LDL Calculated: 95 mg/dL
Triglycerides: 195 mg/dL — ABNORMAL HIGH (ref 10–150)
VLDL: 39 (ref 0–40)

## 2015-09-04 LAB — T4, FREE: T4 Free: 0.97 ng/dL (ref 0.70–1.48)

## 2015-09-04 LAB — T3, FREE: T3, Free: 2.7 pg/mL (ref 1.71–3.71)

## 2015-09-04 MED ORDER — CIPROFLOXACIN HCL 500 MG PO TABS
500.0000 mg | ORAL_TABLET | Freq: Two times a day (BID) | ORAL | Status: AC
Start: 2015-09-04 — End: 2015-09-14

## 2015-09-04 MED ORDER — MIRABEGRON ER 25 MG PO TB24
25.0000 mg | ORAL_TABLET | Freq: Every day | ORAL | Status: DC
Start: 2015-09-04 — End: 2016-06-19

## 2015-09-04 NOTE — Patient Instructions (Signed)
Nasal Allergies: Related Problems  Allergies can cause nasal passages to swell. This narrows the air passags. Allergies also cause increased mucus production in the nose.These changes result in nasal allergy symptoms. Common symptoms include itching, sneezing, stuffy nose, and runny nose. Nasal allergies can also cause problems in other parts of the respiratory system. Some of the more common problems are discussed below. If you think you have any of these problems, talk to your health care provider about treatment options.    Sinus infections  Fluid may be trapped in the sinuses. Bacteria may grow in trapped fluid. This causes sinus infection (sinusitis).  Conjunctivitis  Allergens irritate your eyes, including the lining of the conjunctiva. This causes eyes to become red, itchy, puffy, and watery.  Ear problems  The eustachian tube connects the middle ear to nasal passages. Allergies can block this tube, andmake the ears feel plugged. Fluid may also build up, leading to an ear infection (otitis media).  Nasal polyps  Allergies cause nasal passages to swell. Constant swelling can lead to formation of a sac called a polyp. Polyps can grow large enough to block nasal passages.  Asthma  Asthma is inflammation and swelling of the air passages in the lungs. The symptoms are wheezing, shortness of breath, coughing, and chest tightness. Allergies, including nasal allergies, are common in people with asthma.   [address] [employer] [address], [place], [place] [address]. All rights reserved. This information is not intended as a substitute for professional medical care. Always follow your healthcare professional's instructions.        Healthy Meals for Diabetes  Ask your health care team to help you make a meal plan that fits your needs. Your meal plan tells you when to eat your meals and snacks, what kinds of foods to eat, and how much of each food to eat. You don't have to give up all the foods  you like. But you do need to follow some guidelines.  Eat foods rich in fiber  Fiber is a carbohydrate that breaks down slowly. Fiber is also healthy for your heart. Fiber-rich foods include:       Choose healthy protein foods  Eating protein that is low in fat can help you control your weight. It also helps keep your heart healthy. Low-fat protein foods include:   Fish   Plant proteins, such as dry beans and peas, nuts, and soy products like tofu and soymilk   Lean meat with all visible fat removed   Poultry with the skin removed   Low-fat ornonfat milk, cheese, and yogurt  Limit unhealthy fats and sugar  Saturated and trans fats are unhealthy for your heart. They raise LDL (bad) cholesterol. Fat is also high in calories, so it can make you gain weight. To cut down on unhealthy fats and sugar, limit these foods:       How much to eat  The amount of food you eat affects your blood sugar. It also affects your weight. Your health care team will tell you how much of each type of food you should eat.   Use measuring cups and spoons and afood scale to measure serving sizes.   Learn what a correct serving size looks like on your plate. This will help when you are away from home and can't measure your servings.   Eat only the number of servings given on your meal plan for each food. Don't take seconds.  When to eat  Your  meal plan will likely include breakfast, lunch, dinner, and some snacks.   Try to eat your meals and snacks at about the same times each day.   Eat all your meals and snacks. Skipping a meal or snack can make your blood sugar drop too low. It can also cause you to eat too much at the next meal or snack. Then your blood sugar could get too high.   [address] [employer] [address] [place], [place] [address]. All rights reserved. This information is not intended as a substitute for professional medical care. Always follow your healthcare professional's  instructions.        Diabetes: Understanding Carbohydrates, Fats, and Protein  Food is a source of fuel and nourishment for your body. It's also a source of pleasure. Having diabetes doesn't mean you have to eat special foods or give up desserts. Instead, your dietitian can show you how to plan meals to suit your body. To start, learn how different foods affect blood sugar.    Carbohydrates  Carbohydrates are the main source of fuel for the body. Carbohydrates raise blood sugar. Many people think carbohydrates are only found in pasta or bread. But carbohydrates are actually in many kinds of foods:   Sugars occur naturally in foods such as fruit, milk, honey, and molasses. Sugars can also be added to many foods, from cereals and yogurt to candy and desserts. Sugars raise blood sugar.   Starchesare found in bread, cereals, pasta, and dried beans. They're also found in corn, peas, potatoes, yam, acorn squash, and butternut squash. Starches also raise blood sugar.   Fiberis found in foods such as vegetables, fruits, and whole grains. Unlike other carbs, fiber isn't digested or absorbed. So it doesn't raise blood sugar. In fact, fiber can help keep blood sugar from rising too fast. It also helps keep blood cholesterol at a healthy level.    Fat  Fat is an energy source that can be stored until needed. Fat does not raise blood sugar. However, it can raise blood cholesterol, increasing the risk of heart disease. Fat is also high in calories, which can cause weight gain. Not all types of fat are the same.  More Healthy:   Monounsaturated fatsare mostly found in vegetable oils, such as olive, canola, and peanut oils. They are also found in avocados and some nuts. Monounsaturated fats are healthy for your heart. That's because they lower LDL (unhealthy) cholesterol.   Polyunsaturated fats are mostly found in vegetable oils, such as corn, safflower, and soybean oils. They are also found in some seeds, nuts, and fish.  Polyunsaturated fats lower LDL (unhealthy) cholesterol. So, choosing them instead of saturated fats is healthy for your heart. Certain unsaturated fats can help lower triglycerides.  Less Healthy:   Saturated fats are found in animal products, such as meat, poultry, whole milk, lard, and butter.Saturated fats raise LDL cholesterol and are not healthy for your heart.   Hydrogenated oils and trans fats are formed when vegetable oils are processed into solid fats. They are found in many processed foods. Hydrogenated oils and trans fats raise LDL cholesterol and lower HDL (healthy) cholesterol. They are not healthy for your heart.  Protein  Protein helps the body build and repair muscle and other tissue. Protein has little or no effect on blood sugar. However, many foods that contain protein also contain saturated fat. By choosing low-fat protein sources, you can get the benefits of protein without the extra fat:  Plant protein is found in dry beans and peas, nuts, and soy products, such as tofu and soymilk. These sources tend to be cholesterol-free and low in saturated fat.   Animal protein is found in fish, poultry, meat, cheese, milk, and eggs. These contain cholesterol and can be high in saturated fat. Aim for lean, lower-fat choices.   [address] [employer] [address] [place], [place] [address]. All rights reserved. This information is not intended as a substitute for professional medical care. Always follow your healthcare professional's instructions.        Tips to Control Acid Reflux  To control acid reflux, you'll need to make some basic diet and lifestyle changes. The simple steps outlined below may be all you'll need to relieve discomfort.  Watch What You Eat     Avoid fatty foods and spicy foods.   Eat fewer acidic foods, such as citrus and tomato-based foods. These can increase symptoms.   Limit drinking alcohol, caffeine, and fizzy beverages. All increase acid reflux.   Try  limiting chocolate, peppermint, and spearmint.These can worsen acid reflux in some people.  Watch When You Eat   Avoid lying down for3hours after eating.   Do not snack before going to bed.  Raise Your Head    Raising your head and upper body by 4 inchesto 6 incheshelps limit reflux when you're lying down. Put blocks under the head of the bed frame to raise it.     [address] [employer] [address], [place], [place] [address]. All rights reserved. This information is not intended as a substitute for professional medical care. Always follow your healthcare professional's instructions.        Prediabetes  You have been diagnosed with prediabetes. This means that the level of sugar (glucose) in your blood is too high. If you have prediabetes, you are at risk for developing type 2 diabetes. Type 2 diabetes is diagnosed when the level of glucose in the blood reaches a certain high level. With prediabetes, it hasn't reached this point yet, but it is higher than normal. It is vital to make lifestyle changes to lower your blood sugar, improve your health, and prevent diabetes. This sheet will tell you more.     Why Worry About Prediabetes?  Prediabetes is a disease where the body's cells have trouble using glucose in the blood for energy. As a result, too much glucose stays in the blood and can affect how your heart and blood vessels work. Without changes in diet and lifestyle, the problem can get worse. Once you have type 2 diabetes, it is chronic (ongoing) and needs to be managed for the rest of your life. Diabetes can harm the body and your health by damaging organs, such as your eyes and kidneys. It makes you more likely to have heart disease. And it can damage nerves and blood vessels.  Risk Factors For Prediabetes  The exact cause of prediabetes is not clear. But certain risk factors make a person more likely to have it. These include:   A family history of type 2 diabetes   Being  overweight   Being over age 20   Having had gestational diabetes   Not being physically active   Being [place] American, Asian-American, Hispanic, Native American, or Pacific Islander  Diagnosing Prediabetes  Prediabetes has no symptoms. The only way to find it is with a blood test. You may have had one or both of these blood tests:  Fasting glucose test. Blood is taken and tested after you have fasted (not eaten) for at least 8 hours. A normal test result is 99 milligrams per deciliter (mg/dL) or lower. Prediabetes is 100 mg/dL to 161 mg/dL. Diabetes is 126 mg/dLor higher.   Glucose tolerance test. Your blood sugar is measured before and after you drink a very sugary liquid. A normal test result is 139 milligrams per deciliter (mg/dL) or lower. Prediabetes is 140 mg/dL to 096 mg/dL. Diabetes is 200 mg/dLor higher.   Hemoglobin A1c (HbA1c). Your HbA1c is normal if it is below 5.7%. Prediabetes is 5.7%to 6.4%. Diabetes is 6.5% or higher.  Treating Prediabetes  The best way to treat prediabetes is to loseat least 5% to 7% of your current weight and be more physically active by getting at least 30 minutes of exercise 5 days a week. These changes help the body's cells use blood sugar better. Even a small amount of weight loss can help. Work with your health care provider to make a plan to eat well and be more active. Keep in mind that small changes can add up. Other changes in your lifestyle may make you less likely to develop diabetes. Your health care provider can talk with you about these.  Follow-Up  If it is untreated, prediabetes can turn into diabetes. This is a serious health condition. Take steps to stop this from happening. Follow the treatment plan you have been given. You may have your blood glucose tested again in about 12 to 18 months.     [address] [employer] [address], [place], [place] [address]. All rights reserved. This information is not intended as a substitute for  professional medical care. Always follow your healthcare professional's instructions.        Bladder Infection,Female (Adult)    Normally, bacteria do not stay in the urine.But, when they do, the urine can become infected. This is called a urinary tract infection, or UTI. An infection can occur anywhere in the urinary tract, from the kidney to the bladder and urethra. The most common place for a UTI is in the bladder. This is called a bladder infection. This is one of the most common infections in women. Most bladder infections are easily treated, and are not serious unless the infection spreads up to the kidney.  The phrases "bladder infection", "UTI," and "cystitis," are often used to describe the same thing, but they are not always the same. Cystitis is an inflammation of the bladder. The most common cause of cystitis is an infection.  In summary:   Infections in the urine are called UTIs.   Cystitis is usually caused by a UTI.   Not al UTIs and cases of cystitis are bladder infections.   Bladder infections are the most common type of cystitis.  Symptoms  The infection causes inflammation in the urethra and bladder, which causes many of the symptoms. The most common symptoms of a bladder infection are:   Pain or burning when urinating   Having to urinate more often than usual   Urgent need to urinate   Only a small amount of urine comes out   Blood in urine   Abdominal discomfort, usually in the lower abdomen, above the pubic bone   Cloudy, strong, or bad smelling urine   Urinary retention, being unable to urinate   Urinary incontinence (being unable to hold urine in)   Fever   Loss of appetite   Confusion (in older adults)  Causes  Bladder infections are not contagious. You can't get one from someone else, from a toilet seat, or from sharing a bath.  The most common cause of bladder infections is bacteria from the bowels. The bacteria get onto the skin around the opening of the urethra. From  there it can get into the urine and travel up to the bladder, causing inflammation and infection. This usually happens because of:   Wiping improperly after urinating (always wipe from front to back)   Bowel incontinence   Pregnancy   Procedures such as having a catheter inserted   Older age   Not emptying your bladder (stagnated urine gives bacteria a chance to grow)   Dehydration   Constipation   Sex   Use of a diaphragm for birth control    Treatment  Bladder infections are diagnosed by a urine test. They are treated with antibiotics and usuallyclear up quickly without complications. Treatment helps prevent a more serious kidney infection.  Medicines  Medicines can help in the treatment of a bladder infection:   Take antibiotics until they are used up, even if you feel better. It is important to finish them to make sure the infection has cleared.   You can use acetaminophen or ibuprofen for pain, fever, or discomfort, unless another medicine was prescribed. You can also alternate them, or use both together. They work differently and are a diferent class of medicines, so taking them together is not an overdose. If you have chronic liver or kidney disease or ever had a stomach ulcer or gastrointestinal bleeding, or are taking blood thinner medications, talk with your doctor before using these medicines.   If you are given Pyridium (phenazopydridine) to reduce burning with urination, it will cause your urine to become a bright orange color, which can stain clothing.  Care and prevention  These self care steps can help prevent future infections:   Drink plenty of fluids to prevent dehydration and flush out of the bladder, unless you must restrict fluids for other medical reasons, or your doctor told you not to.   Proper cleaning after going to the bathroom is important. Wipe from front to back after using the toilet to prevent the spread of bacteria.   Urinate more frequently, and don't try to hold  urine in for a long time.   Wear loose fitting clothes and cotton underwear. Avoid tight fitting pans.   Improve your diet and prevent constipation. Eat more fresh fruit and vegetables, more fiber, less junk and fatty foods.   Avoid sexual intercourse until your symptoms are gone.   Avoid caffeine, alcohol, and spicy foods. These can irritate thebladder.   Urinate right after intercourse to flush out the bladder.   If you use birth control pills and have frequent bladder infections, discuss it with your doctor.  Follow-up care  Return to this facility or see your doctor if all symptoms are not gone after 3 days of treatment. This is especially important if you have repeat infections.  If a culture was done, you will be told if your treatment needs to be changed. If directed, you can callto find out the results.  If X-rays were done, you will be told if the results will affect yourtreatment.  Call 911  Call emergency services if any of the following occur:   Trouble breathing   Difficulty arousing, confusion   Fainting or loss of consciousness   Rapid heart rate  When to seek medical care  Get  prompt medical attention if any of the following occur:   Fever of 100.8F (38.0C) or higher, or as directed by your health care provider   No improvement by the third day of treatment   Increasing back or abdominal pain   Repeated vomiting; unable to keep medicine down   Weakness or dizziness   Vaginal discharge   Pain, redness or swelling in the labia (outer vaginal area)   2000-2015 [employer] [facility] [address] [place] [address]. All rights reserved. This information is not intended as a substitute for professional medical care. Always follow your healthcare professional's instructions.

## 2015-09-04 NOTE — Progress Notes (Addendum)
CHRONIC PROGRESS NOTE    Date/Time: 09/04/2015 10:47 AM   Patient Name: Brittany Berry, Brittany Berry: 1946-07-19  PCP: Marchia Meiers, MD    Chief Complaint   Patient presents with   . Follow-up       History of Present Illness:    Brittany Berry is a 70 y.o. female who presents for 4 months medical management of Hypertension, Osteoporosis, Osteoarthritis, GERD, Depression, and Grave's Disease.     [name] reports that she is doing well with PT for her left hand and left pinky finger, which was broken in a fall about a month ago. She also mentions a lesion on her right hand that burns.     She has been coughing and hearing a "rattling" in her throat.  Her energy level is unchanged from baseline and remains able to perform most usual functions without excessive fatigue or difficulty. The patient's appetite has been good. Weight has increased by 2 lbs since last visit.      She has been having urinary frequency and polydipsia for a few weeks.     Her Osteopenia and OA remains stable on Relafen 1000 mg daily and Caltrate 600+D.     She is currently taking Lotrel 5-20 mg daily for HTN control. BP in the office today is 144/77. Patient denies chest pain, chest pressure/discomfort, claudication, dyspnea, exertional chest pressure/discomfort, fatigue, irregular heart beat, lower extremity edema, near-syncope, orthopnea, palpitations, paroxysmal nocturnal dyspnea, syncope and tachypnea.         She is currently taking COQ10 and Lipitor 20 mg daily for hyperlipidemia.       The patient is currently taking Prilosec as needed for relief of GERD symptoms. She denies the following: chest pain, cough, wheezing, weight loss, diarrhea, constipation, heartburn or regurgitation, poor appetite, dysphagia, nausea, heartburn or reflux, dyspepsia, hematemesis, abdominal pain, excessive gas or bloating, colon polyps, hemorrhoids, melena, hematochezia, stomach or duodenal ulcer, gallbladder trouble, jaundice, nocturia, dysuria,  frequency, hesitancy, hematuria, retention, and incontinence.       The patient currently takes Prozac 10 mg daily and Elavil 10 mg QHS for depression/anxiety. She reports that her depression is stable.  She denies irritability, depression, anxiety, decreased concentration, confusion, agitation, memory loss, crying spells, difficulty falling asleep, early AM awakening, fatigue, suicidal ideation, sexual difficulties, marital problems, abusive relationship, excessive alcohol consumption, and illicit drug usage.    The following portions of the patient's history were reviewed and updated as appropriate: allergies, current medications, past family history, past medical history, past social history, past surgical history and problem list.    Problem List:     Patient Active Problem List   Diagnosis   . Unspecified glaucoma   . Essential hypertension, benign   . Tendonitis   . Restless legs syndrome (RLS)   . Impaired fasting glucose   . Dyslipidemia   . Graves disease   . Osteoarthritis   . Chronic headache   . GERD (gastroesophageal reflux disease)   . Osteopenia   . Pre-diabetes   . Nondisp fx of proximal phalanx of left little finger with routine heal       Medications:     Prior to Admission medications    Medication Sig Start Date End Date Taking? Authorizing Provider   amitriptyline (ELAVIL) 10 MG tablet Take 1 tablet (10 mg total) by mouth nightly as needed. 11/07/14  Yes Marchia Meiers, MD   amLODIPine-benazepril (LOTREL 5-20) 5-20 MG per capsule Take 1 capsule by mouth daily. 11/07/14  Yes Marchia Meiers, MD   aspirin 81 MG tablet Take 81 mg by mouth daily.   Yes [provider]   atorvastatin (LIPITOR) 20 MG tablet TAKE 1 TABLET BY MOUTH EVERY DAY 08/02/15  Yes Marchia Meiers, MD   b complex vitamins capsule Take 1 capsule by mouth daily.   Yes [provider]   bimatoprost (LUMIGAN) 0.03 % ophthalmic drops Place 1 drop into both eyes nightly.   Yes [provider]    brimonidine (ALPHAGAN) 0.2 % ophthalmic solution Place 1 drop into both eyes 2 (two) times daily.   Yes [provider]   Calcium Carbonate-Vitamin D (CALTRATE 600+D PO) Take 1 tablet by mouth daily.   Yes [provider]   Cetirizine HCl (ZYRTEC PO) Take 10 mg by mouth as needed.    Yes [provider]   Cholecalciferol (VITAMIN D) 1000 UNIT tablet Take 1,000 Units by mouth daily.   Yes [provider]   CO-ENZYME Q-10 PO Take 300 mg by mouth daily.   Yes [provider]   dorzolamide-timolol (COSOPT) 22.3-6.8 MG/ML ophthalmic solution Apply 1 drop to eye 2 (two) times daily.   Yes [provider]   FLUoxetine (PROZAC) 10 MG capsule TAKE ONE CAPSULE BY MOUTH EVERY DAY 04/03/15  Yes Marchia Meiers, MD   ipratropium (ATROVENT) 0.06 % nasal spray 2 sprays by Nasal route 3 (three) times daily as needed for Rhinitis. 11/07/14  Yes Marchia Meiers, MD   latanoprost Harrel Lemon) 0.005 % ophthalmic solution  02/18/14  Yes [provider]   Mometasone Furoate (NASONEX NA) 50 mcg by Nasal route as needed.   Yes [provider]   Multiple Vitamin (MULTIVITAMIN) tablet Take 1 tablet by mouth daily.   Yes [provider]   nabumetone (RELAFEN) 500 MG tablet Take 2 tablets (1,000 mg total) by mouth daily. 05/08/15  Yes Marchia Meiers, MD   Omega-3 Fatty Acids (FISH OIL BURP-LESS) 1000 MG Cap Take 1 capsule by mouth daily.   Yes [provider]   omeprazole (PRILOSEC) 20 MG capsule TAKE ONE CAPSULE BY MOUTH ONCE DAILY 06/23/15  Yes Graciela Husbands, DO   HYDROcodone-acetaminophen (NORCO) 5-325 MG per tablet Take 1 tablet by mouth every 6 (six) hours as needed for Pain.    [provider]       Allergies:     Allergies   Allergen Reactions   . Codeine Nausea Only   . Prednisone      Causes depression        History:     Family History   Problem Relation Age of Onset   . Cancer Other      BREAST CANCER.   . Colon polyps Other     . Anesthesia problems Other    . Heart disease Mother    . Heart attack Father    . Asthma Brother    . Heart disease Maternal Grandfather    . Heart disease Paternal Grandfather    . Cancer Sister    . Arthritis Sister    . COPD Sister    . Diabetes Sister    . Vasculitis Brother      Past Surgical History   Procedure Laterality Date   . 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 Dilation and curettage of uterus     . Excision, bone spur       left little toe    .  Colonoscopy  02/11/2013     Procedure: COLONOSCOPY;  Surgeon: Dineen Kid, MD;  Location: [facility];  Service: Gastroenterology;  Laterality: N/A;   . Colonoscopy  08/14     - colonoscopy   . Cataract extraction w/ intraocular lens implant  08/2013     right eye     Social History     Social History   . Marital Status: Married     Spouse Name: N/A   . Number of Children: N/A   . Years of Education: N/A     Occupational History   . Not on file.     Social History Main Topics   . Smoking status: Former Smoker     Quit date: 07/22/1982   . Smokeless tobacco: Never Used   . Alcohol Use: Yes      Comment: wine 3/week   . Drug Use: No   . Sexual Activity: Not on file     Other Topics Concern   . Not on file     Social History Narrative     History   Drug Use No       Review of Systems:   Review of Systems   Constitutional: Negative for fever, chills, appetite change and fatigue.   HENT: Positive for sinus pressure (maxillary). Negative for congestion, ear discharge, ear pain, postnasal drip, rhinorrhea, sore throat and trouble swallowing.    Eyes: Negative for pain, discharge, redness and visual disturbance.   Respiratory: Positive for cough. Negative for chest tightness, shortness of breath and wheezing.    Cardiovascular: Negative for chest pain, palpitations and leg swelling.   Gastrointestinal: Negative for nausea, vomiting, abdominal pain, diarrhea and constipation.   Endocrine: Positive for polydipsia. Negative for cold intolerance, heat intolerance,  polyphagia and polyuria.   Genitourinary: Positive for frequency. Negative for dysuria, urgency, hematuria, flank pain, decreased urine volume and difficulty urinating.   Musculoskeletal: Negative for myalgias, back pain, joint swelling, arthralgias, gait problem, neck pain and neck stiffness.   Skin: Negative for rash and wound.   Allergic/Immunologic: Positive for environmental allergies (takes medication). Negative for food allergies.   Neurological: Negative for dizziness, facial asymmetry, speech difficulty, weakness, light-headedness, numbness and headaches.   Hematological: Negative for adenopathy.   Psychiatric/Behavioral: Negative for suicidal ideas, hallucinations, behavioral problems, confusion, sleep disturbance, self-injury, dysphoric mood, decreased concentration and agitation. The patient is not nervous/anxious and is not hyperactive.        Physical Exam:     Filed Vitals:    09/04/15 1003   BP: 144/77   Pulse: 70   Temp: 99 F (37.2 C)   Resp: 18   SpO2: 98%     Body mass index is 33.43 kg/(m^2).  Wt Readings from Last 3 Encounters:   09/04/15 93.895 kg (207 lb)   06/12/15 92.987 kg (205 lb)   05/01/15 93.441 kg (206 lb)       Physical Exam   Constitutional: She is oriented to person, place, and time. She appears well-developed and well-nourished.   HENT:   Head: Normocephalic and atraumatic.   Right Ear: Tympanic membrane and ear canal normal.   Left Ear: Tympanic membrane and ear canal normal.   Nose: No mucosal edema or rhinorrhea. Right sinus exhibits maxillary sinus tenderness. Right sinus exhibits no frontal sinus tenderness. Left sinus exhibits maxillary sinus tenderness. Left sinus exhibits no frontal sinus tenderness.   Mouth/Throat: Uvula is midline and oropharynx is clear and moist. No oropharyngeal exudate  or posterior oropharyngeal erythema.   Eyes: Conjunctivae and EOM are normal. Pupils are equal, round, and reactive to light. Right eye exhibits no discharge. Left eye exhibits no  discharge.   Neck: Normal range of motion. Neck supple. No thyromegaly present.   Cardiovascular: Normal rate, regular rhythm, normal heart sounds and intact distal pulses.  Exam reveals no gallop and no friction rub.    No murmur heard.  Pulmonary/Chest: Effort normal and breath sounds normal. No respiratory distress. She has no wheezes. She has no rales.   Abdominal: Soft. Bowel sounds are normal. She exhibits no distension and no mass. There is no tenderness.   Musculoskeletal: Normal range of motion. She exhibits no edema or tenderness.   Restricted ROM in the left pinky finger   smooth sausage look to skin on the left fingers consistent with RSD   Lymphadenopathy:     She has no cervical adenopathy.   Neurological: She is alert and oriented to person, place, and time. She exhibits normal muscle tone.   Skin: Skin is warm and dry. No rash noted. No erythema.   Actinic keratose on the right dorsal side of the hand that was treated with cryotherapy      Vitals reviewed.        Assessment:     1. Essential hypertension, benign  CBC and differential    TSH    Lipid panel    Hemoglobin A1C    Comprehensive metabolic panel    POCT UA Dipstick    Urine Culture    T3, free    T4, free    ciprofloxacin (CIPRO) 500 MG tablet   2. Dyslipidemia  CBC and differential    TSH    Lipid panel    Hemoglobin A1C    Comprehensive metabolic panel    POCT UA Dipstick    Urine Culture    T3, free    T4, free    ciprofloxacin (CIPRO) 500 MG tablet   3. Graves disease  CBC and differential    TSH    Lipid panel    Hemoglobin A1C    Comprehensive metabolic panel    POCT UA Dipstick    Urine Culture    T3, free    T4, free    ciprofloxacin (CIPRO) 500 MG tablet   4. Pre-diabetes  CBC and differential    TSH    Lipid panel    Hemoglobin A1C    Comprehensive metabolic panel    POCT UA Dipstick    Urine Culture    T3, free    T4, free    ciprofloxacin (CIPRO) 500 MG tablet   5. Actinic keratosis  CBC and differential    TSH    Lipid panel     Hemoglobin A1C    Comprehensive metabolic panel    POCT UA Dipstick    Urine Culture    T3, free    T4, free    ciprofloxacin (CIPRO) 500 MG tablet   6. Subacute maxillary sinusitis  CBC and differential    TSH    Lipid panel    Hemoglobin A1C    Comprehensive metabolic panel    POCT UA Dipstick    Urine Culture    T3, free    T4, free    ciprofloxacin (CIPRO) 500 MG tablet   7. Urinary urgency  CBC and differential    TSH    Lipid panel    Hemoglobin A1C    Comprehensive metabolic  panel    POCT UA Dipstick    Urine Culture    T3, free    T4, free    ciprofloxacin (CIPRO) 500 MG tablet         Plan:      Ms. Habermehl presented today for a 4 month check up. Her hypertension remains stable with her medication. Her depression is stable with Prozac at 10 mg. Her GERD is stable with diet and Prilosec as needed. Alleriges are stable with treatment. The actinic keratose on her hand was given cryotherapy today without complication. For her urine frequency, we will order a UA today and a culture. We will order Myrbetriq to see if that helps decrease the frequency.       For her subacute maxillary sinusitis, we will order Cipro 500 mg BID x 10 days and advised saline nasal spray BID. I also recommended a hot towel and hot liquids. The patient is instructed to increase fluids by mouth, use saline nasal spray liberally, drink hot liquids such as hot tea or soup, and use warm moist facial compresses all in an effort to open and promote sinus drainage and healing. Tylenol or ibuprofen may be used for discomfort or fever.      She remains in pre diabetic range. The patient was recommended a whole food diet with no added sugar diet along with an exercise program 4-5 days a week for 45 minutes daily to promote weight loss. They were also advised to eat 3-4 small meals a day as well as not eating late at night.     We ordered a TSH, CBC, Lipid, CMP, T4, T3, and a Ha1c for her routine lab work.      I have reviewed the plan  instructions with the patient and answered all questions to Her satisfaction. The patient voiced understanding and acceptance of the plan and related advice and will call back if any further questions or concerns.       Return in about 4 months (around 01/02/2016).     By signing my name below, I, Regan Lemming, attest that this documentation has been prepared under the direction and in the presence of Dr. Hyacinth Meeker, MD.  Electronically Signed: Regan Lemming, Chief Scribe. 09/04/2015 10:47 AM      I, Dr. Hyacinth Meeker, personally performed the services described in this documentation. All medical record entries made by the scribe were at my direction and in my presence. I have reviewed the chart and plan instructions and agree that the record reflects my personal performance and is accurate and complete.   Dr. Hyacinth Meeker, MD. 09/04/2015 10:47 AM       All editing by Dr. Hyacinth Meeker was completed with [facility] speech recognition software. Grammatical errors, random word insertions, pronoun errors and incomplete sentences are occasional consequences of this technology due to software limitations. If there are questions or concerns about the content of this note or information contained within the body of this dictation, they should be addressed with the provider for clarification.

## 2016-01-03 ENCOUNTER — Ambulatory Visit (RURAL_HEALTH_CENTER): Payer: No Typology Code available for payment source | Admitting: Family Medicine

## 2016-02-12 ENCOUNTER — Ambulatory Visit (RURAL_HEALTH_CENTER): Payer: No Typology Code available for payment source | Admitting: Family Medicine

## 2016-02-12 ENCOUNTER — Other Ambulatory Visit
Admission: RE | Admit: 2016-02-12 | Discharge: 2016-02-12 | Disposition: A | Discharge status: Home / Self Care | Payer: No Typology Code available for payment source | Source: Ambulatory Visit | Attending: Family Medicine | Admitting: Family Medicine

## 2016-02-12 ENCOUNTER — Encounter (RURAL_HEALTH_CENTER): Payer: Self-pay | Admitting: Family Medicine

## 2016-02-12 VITALS — BP 131/76 | HR 71 | Resp 18 | Wt 205.0 lb

## 2016-02-12 DIAGNOSIS — M545 Low back pain, unspecified: Secondary | ICD-10-CM

## 2016-02-12 DIAGNOSIS — Z13820 Encounter for screening for osteoporosis: Secondary | ICD-10-CM

## 2016-02-12 DIAGNOSIS — I10 Essential (primary) hypertension: Secondary | ICD-10-CM

## 2016-02-12 DIAGNOSIS — E559 Vitamin D deficiency, unspecified: Secondary | ICD-10-CM

## 2016-02-12 DIAGNOSIS — E0500 Thyrotoxicosis with diffuse goiter without thyrotoxic crisis or storm: Secondary | ICD-10-CM

## 2016-02-12 DIAGNOSIS — R7303 Prediabetes: Secondary | ICD-10-CM

## 2016-02-12 DIAGNOSIS — Z23 Encounter for immunization: Secondary | ICD-10-CM

## 2016-02-12 DIAGNOSIS — K219 Gastro-esophageal reflux disease without esophagitis: Secondary | ICD-10-CM

## 2016-02-12 DIAGNOSIS — E785 Hyperlipidemia, unspecified: Secondary | ICD-10-CM

## 2016-02-12 DIAGNOSIS — Z129 Encounter for screening for malignant neoplasm, site unspecified: Secondary | ICD-10-CM

## 2016-02-12 DIAGNOSIS — G8929 Other chronic pain: Secondary | ICD-10-CM

## 2016-02-12 DIAGNOSIS — G2581 Restless legs syndrome: Secondary | ICD-10-CM

## 2016-02-12 HISTORY — DX: Other chronic pain: G89.29

## 2016-02-12 HISTORY — DX: Low back pain, unspecified: M54.50

## 2016-02-12 LAB — COMPREHENSIVE METABOLIC PANEL
ALT: 21 U/L (ref 0–55)
AST (SGOT): 26 U/L (ref 10–42)
Albumin/Globulin Ratio: 1.08 Ratio (ref 0.70–1.50)
Albumin: 4.2 gm/dL (ref 3.5–5.0)
Alkaline Phosphatase: 69 U/L (ref 40–145)
Anion Gap: 12.1 mMol/L (ref 7.0–18.0)
BUN / Creatinine Ratio: 11.6 Ratio (ref 10.0–30.0)
BUN: 10 mg/dL (ref 7–22)
Bilirubin, Total: 0.8 mg/dL (ref 0.1–1.2)
CO2: 26.3 mMol/L (ref 20.0–30.0)
Calcium: 10.1 mg/dL (ref 8.5–10.5)
Chloride: 105 mMol/L (ref 98–110)
Creatinine: 0.86 mg/dL (ref 0.60–1.20)
EGFR: 69 mL/min/{1.73_m2} (ref 60–150)
Globulin: 3.9 gm/dL (ref 2.0–4.0)
Glucose: 107 mg/dL — ABNORMAL HIGH (ref 70–99)
Osmolality Calc: 277 mOsm/kg (ref 275–300)
Potassium: 4.4 mMol/L (ref 3.5–5.3)
Protein, Total: 8.1 gm/dL (ref 6.0–8.3)
Sodium: 139 mMol/L (ref 136–147)

## 2016-02-12 LAB — LIPID PANEL
Cholesterol: 207 mg/dL — ABNORMAL HIGH (ref 75–199)
Coronary Heart Disease Risk: 3.51
HDL: 59 mg/dL (ref 45–65)
LDL Calculated: 118 mg/dL
Triglycerides: 151 mg/dL — ABNORMAL HIGH (ref 10–150)
VLDL: 30 (ref 0–40)

## 2016-02-12 LAB — T3, FREE: T3, Free: 2.8 pg/mL (ref 1.71–3.71)

## 2016-02-12 LAB — CBC AND DIFFERENTIAL
Basophils %: 1.3 % (ref 0.0–3.0)
Basophils Absolute: 0.1 10*3/uL (ref 0.0–0.3)
Eosinophils %: 3.7 % (ref 0.0–7.0)
Eosinophils Absolute: 0.2 10*3/uL (ref 0.0–0.8)
Hematocrit: 43.4 % (ref 36.0–48.0)
Hemoglobin: 14.2 gm/dL (ref 12.0–16.0)
Lymphocytes Absolute: 1.7 10*3/uL (ref 0.6–5.1)
Lymphocytes: 37.1 % (ref 15.0–46.0)
MCH: 32 pg (ref 28–35)
MCHC: 33 gm/dL (ref 32–36)
MCV: 96 fL (ref 80–100)
MPV: 9.2 fL (ref 6.0–10.0)
Monocytes Absolute: 0.4 10*3/uL (ref 0.1–1.7)
Monocytes: 8.9 % (ref 3.0–15.0)
Neutrophils %: 49 % (ref 42.0–78.0)
Neutrophils Absolute: 2.3 10*3/uL (ref 1.7–8.6)
PLT CT: 303 10*3/uL (ref 130–440)
RBC: 4.52 10*6/uL (ref 3.80–5.00)
RDW: 11.6 % (ref 11.0–14.0)
WBC: 4.6 10*3/uL (ref 4.0–11.0)

## 2016-02-12 LAB — TSH: TSH: 1.85 u[IU]/mL (ref 0.40–4.20)

## 2016-02-12 LAB — T4, FREE: T4 Free: 0.99 ng/dL (ref 0.70–1.48)

## 2016-02-12 LAB — HEMOGLOBIN A1C: Hgb A1C, %: 5.3 %

## 2016-02-12 MED ORDER — ASPIRIN 81 MG PO TABS
81.0000 mg | ORAL_TABLET | Freq: Every day | ORAL | Status: DC
Start: 2016-02-12 — End: 2017-11-17

## 2016-02-12 NOTE — Progress Notes (Signed)
Subjective:    Patient ID: Brittany Berry is a 70 y.o. female.    HPI Parish returns today for medical management. She is a delightful 70 year old white female. She has a past medical history of hypertension, restless leg syndrome, prediabetes, dyslipidemia, Graves' disease, osteoarthritis, chronic headaches, osteopenia, GERD as well as chronic low back pain without sciatica. She mentions that today and seems to be aggravating her more and more as she does a lot of activity and yard work outside. She has been on Relafen in the past without much if any relief. Pain levels seem to vary pending her activities. It does not disrupt her sleep however she does have problems sleeping some nights. She will use amitriptyline 10 mg occasionally.  She does mention she's been having a little bit of swelling in her ankles. If this seems worse in the hot weather. She denies any chest pain or shortness of breath.  She has been trying to restrict her dietary intake of food to 8 hours a day but has not been able to lose any weight.  She has had a pneumococcal 23 vaccination but never a Prevnar 13. His been many years since her last screening mammogram or bone density test. She does get a flu shot every fall.    Past Medical History   Diagnosis Date   . Arthritis    . Unspecified glaucoma    . Mixed hyperlipidemia    . Essential hypertension, benign    . Tendonitis    . 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 Restless legs syndrome (RLS)    . Depression    . High cholesterol    . Diabetes mellitus      [facility]- NO MEDS REQUIRED AT THIS TIME.   Marland Kitchen Dyslipidemia 03/30/2013   . Graves disease 03/30/2013   . Osteoarthritis 03/30/2013   . Chronic headache 03/30/2013   . GERD (gastroesophageal reflux disease) 03/30/2013   . Osteopenia 03/30/2013   . Pre-diabetes 02/23/2014   . Chronic right-sided low back pain without sciatica 02/12/2016     Past Surgical History   Procedure Laterality Date   . Colostomy   2004     colitis   . Dilation and curettage of uterus     . Excision, bone spur       left little toe    . Colonoscopy  02/11/2013     Procedure: COLONOSCOPY;  Surgeon: Dineen Kid, MD;  Location: [facility];  Service: Gastroenterology;  Laterality: N/A;   . Colonoscopy  08/14     - colonoscopy   . Cataract extraction w/ intraocular lens implant  08/2013     right eye     Family History   Problem Relation Age of Onset   . Cancer Other      BREAST CANCER.   . Colon polyps Other    . Anesthesia problems Other    . Heart disease Mother    . Heart attack Father    . Asthma Brother    . Heart disease Maternal Grandfather    . Heart disease Paternal Grandfather    . Cancer Sister    . Arthritis Sister    . COPD Sister    . Diabetes Sister    . Vasculitis Brother      Social History     Social History   . Marital Status: Married     Spouse Name: N/A   .  Number of Children: N/A   . Years of Education: N/A     Occupational History   . Not on file.     Social History Main Topics   . Smoking status: Former Smoker     Quit date: 07/22/1982   . Smokeless tobacco: Never Used   . Alcohol Use: Yes      Comment: wine 3/week   . Drug Use: No   . Sexual Activity: Not on file     Other Topics Concern   . Not on file     Social History Narrative         The following portions of the patient's history were reviewed and updated as appropriate: allergies, current medications, past family history, past medical history, past social history, past surgical history and problem list.    Review of Systems   Cardiovascular: Positive for leg swelling.   Musculoskeletal: Positive for back pain.   All other systems reviewed and are negative.          Objective:    Physical Exam   Constitutional: She is oriented to person, place, and time. She appears well-developed. No distress.   Pleasant overweight white female in no apparent distress.   HENT:   Head: Normocephalic and atraumatic.   Right Ear: External ear normal.   Left Ear: External ear  normal.   Nose: Nose normal.   Mouth/Throat: Oropharynx is clear and moist. No oropharyngeal exudate.   Eyes: Conjunctivae and EOM are normal. Pupils are equal, round, and reactive to light. Right eye exhibits no discharge. Left eye exhibits no discharge. No scleral icterus.   Neck: Normal range of motion. Neck supple. No JVD present. No tracheal deviation present. No thyromegaly present.   Cardiovascular: Normal rate, regular rhythm, normal heart sounds and intact distal pulses.  Exam reveals no gallop and no friction rub.    No murmur heard.  Pulmonary/Chest: Effort normal and breath sounds normal. No stridor. No respiratory distress. She has no wheezes. She has no rales. She exhibits no tenderness.   Abdominal: Soft. Bowel sounds are normal. She exhibits no distension and no mass. There is no tenderness. There is no rebound and no guarding.   Musculoskeletal: Normal range of motion. She exhibits no edema or tenderness.   Lymphadenopathy:     She has no cervical adenopathy.   Neurological: She is alert and oriented to person, place, and time. Coordination normal.   Skin: Skin is warm and dry. No rash noted. She is not diaphoretic. No erythema. No pallor.   Psychiatric: She has a normal mood and affect. Her behavior is normal. Judgment and thought content normal.   Nursing note and vitals reviewed.          Assessment:       1. Chronic right-sided low back pain without sciatica    2. Dyslipidemia    3. Essential hypertension, benign    4. Gastroesophageal reflux disease, esophagitis presence not specified    5. Graves disease    6. Pre-diabetes    7. Restless legs syndrome (RLS)    8. Vitamin D deficiency    9. Immunization due    10. Special screening for cancer    11. Special screening for osteoporosis          Plan:       1.The patient was recommended a whole food no added sugar low sodium diet along with an exercise program 4-5 days a week for 45 minutes daily to  promote weight loss. They were also advised to  eat 3-4 small meals a day as well as not eating late at night.  2. Prevnar 13 administered  3. Orders for DEXA bone scan as well as screening mammogram  4. Enroll in physical therapy for her low back pain.  5. Once again we discussed an 8 hour eating day.  6. Follow-up 4 months. Sooner if needed.  7. Fasting lab work drawn today.

## 2016-02-13 ENCOUNTER — Other Ambulatory Visit (RURAL_HEALTH_CENTER): Payer: Self-pay

## 2016-02-13 DIAGNOSIS — I10 Essential (primary) hypertension: Secondary | ICD-10-CM

## 2016-02-13 LAB — VITAMIN D,25 OH,TOTAL: Vitamin D 25-Hydroxy: 47 ng/mL (ref 30–80)

## 2016-02-13 MED ORDER — AMLODIPINE BESY-BENAZEPRIL HCL 5-20 MG PO CAPS
1.0000 | ORAL_CAPSULE | Freq: Every day | ORAL | Status: DC
Start: 2016-02-13 — End: 2017-05-10

## 2016-02-16 ENCOUNTER — Ambulatory Visit
Admission: RE | Admit: 2016-02-16 | Discharge: 2016-02-16 | Disposition: A | Discharge status: Home / Self Care | Payer: No Typology Code available for payment source | Source: Ambulatory Visit | Attending: Family Medicine | Admitting: Family Medicine

## 2016-02-16 ENCOUNTER — Other Ambulatory Visit (RURAL_HEALTH_CENTER): Payer: Self-pay | Admitting: Family Medicine

## 2016-02-16 DIAGNOSIS — Z129 Encounter for screening for malignant neoplasm, site unspecified: Secondary | ICD-10-CM

## 2016-02-16 DIAGNOSIS — M8588 Other specified disorders of bone density and structure, other site: Secondary | ICD-10-CM | POA: Insufficient documentation

## 2016-02-16 DIAGNOSIS — R921 Mammographic calcification found on diagnostic imaging of breast: Secondary | ICD-10-CM | POA: Insufficient documentation

## 2016-02-16 DIAGNOSIS — Z13820 Encounter for screening for osteoporosis: Secondary | ICD-10-CM | POA: Insufficient documentation

## 2016-02-16 DIAGNOSIS — M85852 Other specified disorders of bone density and structure, left thigh: Secondary | ICD-10-CM | POA: Insufficient documentation

## 2016-02-16 DIAGNOSIS — Z1231 Encounter for screening mammogram for malignant neoplasm of breast: Secondary | ICD-10-CM | POA: Insufficient documentation

## 2016-02-16 HISTORY — DX: Solitary cyst of unspecified breast: N60.09

## 2016-03-27 ENCOUNTER — Other Ambulatory Visit (RURAL_HEALTH_CENTER): Payer: Self-pay | Admitting: Family Medicine

## 2016-06-19 ENCOUNTER — Encounter (RURAL_HEALTH_CENTER): Payer: Self-pay | Admitting: Family Medicine

## 2016-06-19 ENCOUNTER — Ambulatory Visit (RURAL_HEALTH_CENTER): Payer: No Typology Code available for payment source | Admitting: Family Medicine

## 2016-06-19 ENCOUNTER — Other Ambulatory Visit
Admission: RE | Admit: 2016-06-19 | Discharge: 2016-06-19 | Disposition: A | Discharge status: Home / Self Care | Payer: No Typology Code available for payment source | Source: Ambulatory Visit | Attending: Family Medicine | Admitting: Family Medicine

## 2016-06-19 VITALS — BP 117/77 | HR 69 | Temp 97.8°F | Resp 18 | Wt 203.0 lb

## 2016-06-19 DIAGNOSIS — M150 Primary generalized (osteo)arthritis: Secondary | ICD-10-CM

## 2016-06-19 DIAGNOSIS — L570 Actinic keratosis: Secondary | ICD-10-CM

## 2016-06-19 DIAGNOSIS — G2581 Restless legs syndrome: Secondary | ICD-10-CM

## 2016-06-19 DIAGNOSIS — R7303 Prediabetes: Secondary | ICD-10-CM

## 2016-06-19 DIAGNOSIS — E0500 Thyrotoxicosis with diffuse goiter without thyrotoxic crisis or storm: Secondary | ICD-10-CM

## 2016-06-19 DIAGNOSIS — M159 Polyosteoarthritis, unspecified: Secondary | ICD-10-CM

## 2016-06-19 DIAGNOSIS — I10 Essential (primary) hypertension: Secondary | ICD-10-CM

## 2016-06-19 DIAGNOSIS — E559 Vitamin D deficiency, unspecified: Secondary | ICD-10-CM

## 2016-06-19 DIAGNOSIS — Z23 Encounter for immunization: Secondary | ICD-10-CM

## 2016-06-19 DIAGNOSIS — E785 Hyperlipidemia, unspecified: Secondary | ICD-10-CM

## 2016-06-19 DIAGNOSIS — K219 Gastro-esophageal reflux disease without esophagitis: Secondary | ICD-10-CM

## 2016-06-19 DIAGNOSIS — M8580 Other specified disorders of bone density and structure, unspecified site: Secondary | ICD-10-CM

## 2016-06-19 LAB — T4, FREE: T4 Free: 1.45 ng/dL (ref 0.70–1.48)

## 2016-06-19 LAB — TSH: TSH: 0.01 u[IU]/mL — ABNORMAL LOW (ref 0.40–4.20)

## 2016-06-19 LAB — COMPREHENSIVE METABOLIC PANEL
ALT: 22 U/L (ref 0–55)
AST (SGOT): 22 U/L (ref 10–42)
Albumin/Globulin Ratio: 1.17 Ratio (ref 0.70–1.50)
Albumin: 4.1 gm/dL (ref 3.5–5.0)
Alkaline Phosphatase: 75 U/L (ref 40–145)
Anion Gap: 12.8 mMol/L (ref 7.0–18.0)
BUN / Creatinine Ratio: 16.4 Ratio (ref 10.0–30.0)
BUN: 11 mg/dL (ref 7–22)
Bilirubin, Total: 0.7 mg/dL (ref 0.1–1.2)
CO2: 25.6 mMol/L (ref 20.0–30.0)
Calcium: 9.8 mg/dL (ref 8.5–10.5)
Chloride: 104 mMol/L (ref 98–110)
Creatinine: 0.67 mg/dL (ref 0.60–1.20)
EGFR: 89 mL/min/{1.73_m2} (ref 60–150)
Globulin: 3.5 gm/dL (ref 2.0–4.0)
Glucose: 96 mg/dL (ref 70–99)
Osmolality Calc: 275 mOsm/kg (ref 275–300)
Potassium: 4.4 mMol/L (ref 3.5–5.3)
Protein, Total: 7.6 gm/dL (ref 6.0–8.3)
Sodium: 138 mMol/L (ref 136–147)

## 2016-06-19 LAB — CBC AND DIFFERENTIAL
Basophils %: 0.6 % (ref 0.0–3.0)
Basophils Absolute: 0 10*3/uL (ref 0.0–0.3)
Eosinophils %: 2.5 % (ref 0.0–7.0)
Eosinophils Absolute: 0.1 10*3/uL (ref 0.0–0.8)
Hematocrit: 43.8 % (ref 36.0–48.0)
Hemoglobin: 14 gm/dL (ref 12.0–16.0)
Lymphocytes Absolute: 1.7 10*3/uL (ref 0.6–5.1)
Lymphocytes: 29.9 % (ref 15.0–46.0)
MCH: 30 pg (ref 28–35)
MCHC: 32 gm/dL (ref 32–36)
MCV: 92 fL (ref 80–100)
MPV: 9.2 fL (ref 6.0–10.0)
Monocytes Absolute: 0.6 10*3/uL (ref 0.1–1.7)
Monocytes: 10.4 % (ref 3.0–15.0)
Neutrophils %: 56.6 % (ref 42.0–78.0)
Neutrophils Absolute: 3.2 10*3/uL (ref 1.7–8.6)
PLT CT: 302 10*3/uL (ref 130–440)
RBC: 4.74 10*6/uL (ref 3.80–5.00)
RDW: 11.2 % (ref 11.0–14.0)
WBC: 5.6 10*3/uL (ref 4.0–11.0)

## 2016-06-19 LAB — LIPID PANEL
Cholesterol: 185 mg/dL (ref 75–199)
Coronary Heart Disease Risk: 3.14
HDL: 59 mg/dL (ref 45–65)
LDL Calculated: 93 mg/dL
Triglycerides: 163 mg/dL — ABNORMAL HIGH (ref 10–150)
VLDL: 33 (ref 0–40)

## 2016-06-19 LAB — T3, FREE: T3, Free: 4.3 pg/mL — ABNORMAL HIGH (ref 1.71–3.71)

## 2016-06-19 LAB — VITAMIN B12 AND FOLATE
Folate: 18.5 ng/mL (ref 7.0–19.9)
Vitamin B-12: 862 pg/mL — ABNORMAL HIGH (ref 213–816)

## 2016-06-19 LAB — HEMOGLOBIN A1C: Hgb A1C, %: 5.2 %

## 2016-06-19 NOTE — Progress Notes (Signed)
Subjective:    Patient ID: Brittany Berry is a 70 y.o. female.    HPI Brittany Berry returns today for medical management. She is a delightful 70 year old white female his medical problem list includes hypertension, prediabetes, exogenous obesity, osteoarthritis, history of Graves' disease, dyslipidemia, GERD, depression and osteopenia.  She seems to be doing well. She tells me she had a little intestinal virus last week with nausea and anorexia. Those symptoms have completely resolved.  She does complain of mild chronic fatigue and breathlessness when she exerts herself. She does not have any substernal chest pain. She denies any rest or nocturnal symptoms when she lies down.  She has no problem with her restless leg syndrome at the present time. She is taking amitriptyline. That helps with the restless leg syndrome as well as her chronic headaches which are nonfasting at present.  Her arthritic symptoms are under good control. She'll only occasionally take her Relafen. She is not needing any medication except on I very rare basis for acid reflux. She uses Prilosec when needed. She is not on thyroid replacement.  Her blood pressure seems under good control. She does mention a little bit of a mild chronic cough that she is experiencing. I suspect this is due to the benazepril and her Lotrel. She tells me that it is not bad enough for her to consider changing medication.  She has a few skin lesions she wants me to evaluate. There is one on the dorsum of her left hand and one under her right eye on her cheek.  She does need tetanus immunization today but otherwise all her immunizations are up-to-date. Her last colonoscopy was in 2014. She does have a history of an adenomatous polyp.  Past Medical History:   Diagnosis Date   . Arthritis    . Chronic headache 03/30/2013   . Chronic right-sided low back pain without sciatica 02/12/2016   . Cyst of breast    . Depression    . Diabetes mellitus     [facility]- NO MEDS  REQUIRED AT THIS TIME.   Marland Kitchen Dyslipidemia 03/30/2013   . Essential hypertension, benign    . GERD (gastroesophageal reflux disease) 03/30/2013   . Graves disease 03/30/2013   . High cholesterol    . Mixed hyperlipidemia    . Osteoarthritis 03/30/2013   . Osteopenia 03/30/2013   . Pre-diabetes 02/23/2014   . Restless legs syndrome (RLS)    . Tendonitis    . 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 Unspecified glaucoma(365.9)      Past Surgical History:   Procedure Laterality Date   . BREAST CYST ASPIRATION     . CATARACT EXTRACTION W/ INTRAOCULAR LENS IMPLANT  08/2013    right eye   . COLONOSCOPY  02/11/2013    Procedure: COLONOSCOPY;  Surgeon: Dineen Kid, MD;  Location: [facility];  Service: Gastroenterology;  Laterality: N/A;   . COLONOSCOPY  08/14    - colonoscopy   . DILATION AND CURETTAGE OF UTERUS     . EXCISION, BONE SPUR      left little toe        Family History   Problem Relation Age of Onset   . Cancer Other      BREAST CANCER.   . Colon polyps Other    . Anesthesia problems Other    . Heart disease Mother    . Heart attack Father    .  Asthma Brother    . Heart disease Maternal Grandfather    . Heart disease Paternal Grandfather    . Cancer Sister    . Arthritis Sister    . COPD Sister    . Diabetes Sister    . Vasculitis Brother      Social History     Social History   . Marital status: Married     Spouse name: N/A   . Number of children: N/A   . Years of education: N/A     Occupational History   . Not on file.     Social History Main Topics   . Smoking status: Former Smoker     Quit date: 07/22/1982   . Smokeless tobacco: Never Used   . Alcohol use Yes      Comment: wine 3/week   . Drug use: No   . Sexual activity: Not on file     Other Topics Concern   . Not on file     Social History Narrative   . No narrative on file         The following portions of the patient's history were reviewed and updated as appropriate: allergies, current medications, past family history, past  medical history, past social history, past surgical history and problem list.    Review of Systems   All other systems reviewed and are negative.          Objective:    Physical Exam   Constitutional: She is oriented to person, place, and time. She appears well-developed and well-nourished. No distress.   HENT:   Head: Normocephalic and atraumatic.   Right Ear: External ear normal.   Left Ear: External ear normal.   Nose: Nose normal.   Mouth/Throat: Oropharynx is clear and moist. No oropharyngeal exudate.   Eyes: Conjunctivae and EOM are normal. Pupils are equal, round, and reactive to light. Right eye exhibits no discharge. Left eye exhibits no discharge. No scleral icterus.   Neck: Normal range of motion. Neck supple. No JVD present. No tracheal deviation present. No thyromegaly present.   Cardiovascular: Normal rate, regular rhythm, normal heart sounds and intact distal pulses.  Exam reveals no gallop and no friction rub.    No murmur heard.  Pulmonary/Chest: Effort normal and breath sounds normal. No stridor. No respiratory distress. She has no wheezes. She has no rales. She exhibits no tenderness.   Abdominal: Soft. Bowel sounds are normal. She exhibits no distension and no mass. There is no tenderness. There is no rebound and no guarding.   Musculoskeletal: Normal range of motion. She exhibits no edema or tenderness.   Lymphadenopathy:     She has no cervical adenopathy.   Neurological: She is alert and oriented to person, place, and time. No cranial nerve deficit. Coordination normal.   Skin: Skin is warm and dry. No rash noted. She is not diaphoretic. No erythema. No pallor.   Elevated actinic keratosis noted on her right cheek below her right eye as well as on the dorsum of her left hand. These both were treated in a similar fashion using liquid nitrogen cryotherapy with the cryotherapy done. She tolerated the procedure well. Aftercare instructions were discussed.   Psychiatric: She has a normal mood and  affect. Her behavior is normal. Judgment and thought content normal.   Nursing note and vitals reviewed.          Assessment:       1. Essential hypertension, benign  2. Pre-diabetes    3. Restless legs syndrome (RLS)    4. Dyslipidemia    5. Graves disease    6. Primary osteoarthritis involving multiple joints    7. Gastroesophageal reflux disease, esophagitis presence not specified    8. Osteopenia, unspecified location    9. Immunization due    10. Actinic keratoses    11. Vitamin D deficiency          Plan:       1.The patient was recommended a whole food no added sugar low sodium diet along with an exercise program 4-5 days a week for 45 minutes daily to promote weight loss. They were also advised to eat 3-4 small meals a day as well as not eating late at night.  2. Reviewed all of her medications and she is being compliant.  3.Tdap administered  4. Fasting lab work today includes CBC, CMP, lipids, thyroid function, hemoglobin A1c, vitamin D, B12 and folic acid.  5. Follow-up 3 months. Sooner if needed.

## 2016-06-20 LAB — VITAMIN D,25 OH,TOTAL: Vitamin D 25-Hydroxy: 48 ng/mL (ref 30–80)

## 2016-07-30 ENCOUNTER — Other Ambulatory Visit (RURAL_HEALTH_CENTER): Payer: Self-pay | Admitting: Family Medicine

## 2016-08-07 ENCOUNTER — Encounter: Payer: Self-pay | Admitting: Certified Registered"

## 2016-08-07 ENCOUNTER — Ambulatory Visit
Admission: RE | Admit: 2016-08-07 | Discharge: 2016-08-07 | Disposition: A | Discharge status: Home / Self Care | Payer: No Typology Code available for payment source | Attending: Ophthalmology | Admitting: Ophthalmology

## 2016-08-07 ENCOUNTER — Ambulatory Visit: Payer: No Typology Code available for payment source | Admitting: Certified Registered"

## 2016-08-07 ENCOUNTER — Ambulatory Visit: Payer: No Typology Code available for payment source | Admitting: Ophthalmology

## 2016-08-07 ENCOUNTER — Encounter
Admission: RE | Disposition: A | Discharge status: Home / Self Care | Payer: Self-pay | Source: Home / Self Care | Attending: Ophthalmology

## 2016-08-07 DIAGNOSIS — Z9841 Cataract extraction status, right eye: Secondary | ICD-10-CM | POA: Insufficient documentation

## 2016-08-07 DIAGNOSIS — Z79891 Long term (current) use of opiate analgesic: Secondary | ICD-10-CM | POA: Insufficient documentation

## 2016-08-07 DIAGNOSIS — Z803 Family history of malignant neoplasm of breast: Secondary | ICD-10-CM | POA: Insufficient documentation

## 2016-08-07 DIAGNOSIS — E0500 Thyrotoxicosis with diffuse goiter without thyrotoxic crisis or storm: Secondary | ICD-10-CM | POA: Insufficient documentation

## 2016-08-07 DIAGNOSIS — Z885 Allergy status to narcotic agent status: Secondary | ICD-10-CM | POA: Insufficient documentation

## 2016-08-07 DIAGNOSIS — E119 Type 2 diabetes mellitus without complications: Secondary | ICD-10-CM | POA: Insufficient documentation

## 2016-08-07 DIAGNOSIS — Z87891 Personal history of nicotine dependence: Secondary | ICD-10-CM | POA: Insufficient documentation

## 2016-08-07 DIAGNOSIS — Z8249 Family history of ischemic heart disease and other diseases of the circulatory system: Secondary | ICD-10-CM | POA: Insufficient documentation

## 2016-08-07 DIAGNOSIS — Z8371 Family history of colonic polyps: Secondary | ICD-10-CM | POA: Insufficient documentation

## 2016-08-07 DIAGNOSIS — Z79899 Other long term (current) drug therapy: Secondary | ICD-10-CM | POA: Insufficient documentation

## 2016-08-07 DIAGNOSIS — Z833 Family history of diabetes mellitus: Secondary | ICD-10-CM | POA: Insufficient documentation

## 2016-08-07 DIAGNOSIS — Z825 Family history of asthma and other chronic lower respiratory diseases: Secondary | ICD-10-CM | POA: Insufficient documentation

## 2016-08-07 DIAGNOSIS — M8580 Other specified disorders of bone density and structure, unspecified site: Secondary | ICD-10-CM | POA: Insufficient documentation

## 2016-08-07 DIAGNOSIS — F329 Major depressive disorder, single episode, unspecified: Secondary | ICD-10-CM | POA: Insufficient documentation

## 2016-08-07 DIAGNOSIS — H269 Unspecified cataract: Secondary | ICD-10-CM | POA: Insufficient documentation

## 2016-08-07 DIAGNOSIS — M1990 Unspecified osteoarthritis, unspecified site: Secondary | ICD-10-CM | POA: Insufficient documentation

## 2016-08-07 DIAGNOSIS — E782 Mixed hyperlipidemia: Secondary | ICD-10-CM | POA: Insufficient documentation

## 2016-08-07 DIAGNOSIS — Z7982 Long term (current) use of aspirin: Secondary | ICD-10-CM | POA: Insufficient documentation

## 2016-08-07 DIAGNOSIS — I10 Essential (primary) hypertension: Secondary | ICD-10-CM | POA: Insufficient documentation

## 2016-08-07 DIAGNOSIS — K219 Gastro-esophageal reflux disease without esophagitis: Secondary | ICD-10-CM | POA: Insufficient documentation

## 2016-08-07 DIAGNOSIS — H25012 Cortical age-related cataract, left eye: Secondary | ICD-10-CM | POA: Diagnosis present

## 2016-08-07 DIAGNOSIS — Z888 Allergy status to other drugs, medicaments and biological substances status: Secondary | ICD-10-CM | POA: Insufficient documentation

## 2016-08-07 HISTORY — PX: EXTRACTION, CATARACT, PHACO, IOL: SHX4086

## 2016-08-07 SURGERY — EXTRACTION, CATARACT, PHACOEMULSIFICATION, INSERTION INTRAOCULAR LENS (IOL)
Anesthesia: Anesthesia MAC / Sedation | Site: Eye | Laterality: Left | Wound class: Clean

## 2016-08-07 MED ORDER — TETRACAINE HCL 0.5 % OP SOLN
OPHTHALMIC | Status: DC | PRN
Start: 2016-08-07 — End: 2016-08-07
  Administered 2016-08-07: 2 [drp]

## 2016-08-07 MED ORDER — ACETAZOLAMIDE 250 MG PO TABS
250.0000 mg | ORAL_TABLET | Freq: Once | ORAL | Status: AC
Start: 2016-08-07 — End: 2016-08-07
  Administered 2016-08-07: 250 mg via ORAL

## 2016-08-07 MED ORDER — SODIUM HYALURONATE 14 MG/ML IO SOLN
INTRAOCULAR | Status: DC | PRN
Start: 2016-08-07 — End: 2016-08-07
  Administered 2016-08-07: .85 mL via INTRAOCULAR

## 2016-08-07 MED ORDER — MIDAZOLAM HCL 2 MG/2ML IJ SOLN
INTRAMUSCULAR | Status: DC | PRN
Start: 2016-08-07 — End: 2016-08-07
  Administered 2016-08-07: 2 mg via INTRAVENOUS

## 2016-08-07 MED ORDER — PHENYLEPHRINE HCL 10 % OP SOLN
1.0000 [drp] | Freq: Once | OPHTHALMIC | Status: AC
Start: 2016-08-07 — End: 2016-08-07
  Administered 2016-08-07: 1 [drp] via OPHTHALMIC

## 2016-08-07 MED ORDER — POVIDONE-IODINE 5 % OP SOLN
Freq: Once | OPHTHALMIC | Status: AC
Start: 2016-08-07 — End: 2016-08-07

## 2016-08-07 MED ORDER — TROPICAMIDE 1 % OP SOLN
1.0000 [drp] | OPHTHALMIC | Status: AC
Start: 2016-08-07 — End: 2016-08-07
  Administered 2016-08-07 (×3): 1 [drp] via OPHTHALMIC

## 2016-08-07 MED ORDER — LIDOCAINE HCL 1 % IJ SOLN
INTRAMUSCULAR | Status: DC | PRN
Start: 2016-08-07 — End: 2016-08-07
  Administered 2016-08-07: 1 mL

## 2016-08-07 MED ORDER — MIDAZOLAM HCL 2 MG/2ML IJ SOLN
INTRAMUSCULAR | Status: AC
Start: 2016-08-07 — End: ?
  Filled 2016-08-07: qty 2

## 2016-08-07 MED ORDER — TETRACAINE HCL 0.5 % OP SOLN
1.0000 [drp] | Freq: Two times a day (BID) | OPHTHALMIC | Status: DC | PRN
Start: 2016-08-07 — End: 2016-08-07
  Administered 2016-08-07: 1 [drp] via OPHTHALMIC

## 2016-08-07 MED ORDER — BSS IO SOLN
INTRAOCULAR | Status: DC | PRN
Start: 2016-08-07 — End: 2016-08-07
  Administered 2016-08-07: 350 mL
  Administered 2016-08-07: 10 mL

## 2016-08-07 MED ORDER — TOBRAMYCIN-DEXAMETHASONE 0.3-0.1 % OP SUSP
OPHTHALMIC | Status: DC | PRN
Start: 2016-08-07 — End: 2016-08-07
  Administered 2016-08-07: 2 [drp]

## 2016-08-07 SURGICAL SUPPLY — 10 items
GLOVE BIOGEL UND PI IND SZ 7 (Supply) ×2 IMPLANT
GLOVE BIOGEL UT M PI SUR SZ6.5 (Supply) ×2 IMPLANT
GLOVE BIOGEL UT M PI SURG SZ 8 (Supply) ×2 IMPLANT
GLOVE STERILE COTTON LARGE (Supply) ×2 IMPLANT
LENS HOYA M250 17.50 ×1 IMPLANT
MALYUGIN RING 6.25MM (Supply) ×1 IMPLANT
PACK CATARACT (Supply) ×2 IMPLANT
SOL WATER STERILE IRRG 1000ML (Solutions) ×2 IMPLANT
SYSTEM ALLEGRO SP (Supply) ×2 IMPLANT
TUBING PHACO OP061 AMO  6/BX (Supply) ×2 IMPLANT

## 2016-08-07 NOTE — Transfer of Care (Signed)
Anesthesia Transfer of Care Note    Patient: Brittany Berry    Last vitals:   Vitals:    08/07/16 1216   BP: 128/69   Pulse: 69   Resp: 20   Temp: 36.8   SpO2: 93%       Oxygen: Room Air     Mental Status:awake    Airway: Natural    Cardiovascular Status:  stable

## 2016-08-07 NOTE — H&P (Signed)
ADMISSION HISTORY AND PHYSICAL EXAM    Date Time: 08/07/16 11:02 AM  Patient Name: Brittany Berry  Attending Physician: Su Grand, MD    Assessment:   Decreased visual acuity left eye    Plan:   PPE + PC IOL left eye    History of Present Illness:   Brittany Berry is a 71 y.o. female who presents to the hospital with deceased vision in left eye    Past Medical History:     Past Medical History:   Diagnosis Date   . Arthritis    . Chronic headache 03/30/2013   . Chronic right-sided low back pain without sciatica 02/12/2016   . Cyst of breast    . Depression    . Diabetes mellitus     [facility]- NO MEDS REQUIRED AT THIS TIME.   Marland Kitchen Dyslipidemia 03/30/2013   . Essential hypertension, benign    . GERD (gastroesophageal reflux disease) 03/30/2013   . Graves disease 03/30/2013   . High cholesterol    . Mixed hyperlipidemia    . Osteoarthritis 03/30/2013   . Osteopenia 03/30/2013   . Pre-diabetes 02/23/2014   . Restless legs syndrome (RLS)    . Tendonitis    . 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 Unspecified glaucoma(365.9)        Past Surgical History:     Past Surgical History:   Procedure Laterality Date   . BREAST CYST ASPIRATION     . CATARACT EXTRACTION W/ INTRAOCULAR LENS IMPLANT  08/2013    right eye   . COLONOSCOPY  02/11/2013    Procedure: COLONOSCOPY;  Surgeon: Dineen Kid, MD;  Location: [facility];  Service: Gastroenterology;  Laterality: N/A;   . COLONOSCOPY  08/14    - colonoscopy   . DILATION AND CURETTAGE OF UTERUS     . EXCISION, BONE SPUR      left little toe        Family History:     Family History   Problem Relation Age of Onset   . Cancer Other      BREAST CANCER.   . Colon polyps Other    . Anesthesia problems Other    . Heart disease Mother    . Heart attack Father    . Asthma Brother    . Heart disease Maternal Grandfather    . Heart disease Paternal Grandfather    . Cancer Sister    . Arthritis Sister    . COPD Sister    . Diabetes Sister     . Vasculitis Brother        Social History:     Social History     Social History   . Marital status: Married     Spouse name: N/A   . Number of children: N/A   . Years of education: N/A     Social History Main Topics   . Smoking status: Former Smoker     Quit date: 07/22/1982   . Smokeless tobacco: Never Used   . Alcohol use Yes      Comment: wine 3/week   . Drug use: No   . Sexual activity: Not on file     Other Topics Concern   . Not on file     Social History Narrative   . No narrative on file       Allergies:     Allergies   Allergen Reactions   .  Codeine Nausea Only   . Prednisone      Causes depression       Medications:     Prescriptions Prior to Admission   Medication Sig   . amitriptyline (ELAVIL) 10 MG tablet Take 1 tablet (10 mg total) by mouth nightly as needed.   Marland Kitchen amLODIPine-benazepril (LOTREL 5-20) 5-20 MG per capsule Take 1 capsule by mouth daily.   Marland Kitchen aspirin 81 MG tablet Take 1 tablet (81 mg total) by mouth daily.   Marland Kitchen atorvastatin (LIPITOR) 20 MG tablet TAKE 1 TABLET BY MOUTH EVERY DAY   . b complex vitamins capsule Take 1 capsule by mouth daily.   . bimatoprost (LUMIGAN) 0.03 % ophthalmic drops Place 1 drop into both eyes nightly.   . brimonidine (ALPHAGAN) 0.2 % ophthalmic solution Place 1 drop into both eyes 2 (two) times daily.   . Cetirizine HCl (ZYRTEC PO) Take 10 mg by mouth as needed.    . Cholecalciferol (VITAMIN D) 1000 UNIT tablet Take 1,000 Units by mouth daily.   Marland Kitchen CO-ENZYME Q-10 PO Take 300 mg by mouth daily.   . dorzolamide-timolol (COSOPT) 22.3-6.8 MG/ML ophthalmic solution Apply 1 drop to eye 2 (two) times daily.   Marland Kitchen FLUoxetine (PROZAC) 10 MG capsule TAKE ONE CAPSULE BY MOUTH EVERY DAY   . HYDROcodone-acetaminophen (NORCO) 5-325 MG per tablet Take 1 tablet by mouth every 6 (six) hours as needed for Pain.   Marland Kitchen ipratropium (ATROVENT) 0.06 % nasal spray 2 sprays by Nasal route 3 (three) times daily as needed for Rhinitis.   Marland Kitchen latanoprost (XALATAN) 0.005 % ophthalmic solution    .  Mometasone Furoate (NASONEX NA) 50 mcg by Nasal route as needed.   . Multiple Vitamin (MULTIVITAMIN) tablet Take 1 tablet by mouth daily.   . nabumetone (RELAFEN) 500 MG tablet Take 2 tablets (1,000 mg total) by mouth daily. (Patient taking differently: Take 1,000 mg by mouth 2 (two) times daily as needed.    )   . Omega-3 Fatty Acids (FISH OIL BURP-LESS) 1000 MG Cap Take 1 capsule by mouth daily.   Marland Kitchen omeprazole (PRILOSEC) 20 MG capsule TAKE ONE CAPSULE BY MOUTH ONCE DAILY       Review of Systems:   A comprehensive review of systems was: General ROS: negative  Ophthalmic ROS: positive for - blurry vision and decreased vision  Respiratory ROS: negative  Cardiovascular ROS: negative    Physical Exam:     Vitals:    08/07/16 1047   BP: 135/85   Pulse: 73   Resp: 20   Temp: 97.7 F (36.5 C)   SpO2: 97%       Intake and Output Summary (Last 24 hours) at Date Time  No intake or output data in the 24 hours ending 08/07/16 1102    General appearance - alert, well appearing, and in no distress and oriented to person, place, and time  Eyes - pupils equal and reactive, extraocular eye movements intact, cataracts noted  Mouth - mucous membranes moist, pharynx normal without lesions  Neck - supple, no significant adenopathy  Chest - clear to auscultation, no wheezes, rales or rhonchi, symmetric air entry  Heart - normal rate, regular rhythm, normal S1, S2, no murmurs, rubs, clicks or gallops    Labs:     Results     ** No results found for the last 24 hours. **          Signed by: Su Grand, MD

## 2016-08-07 NOTE — Discharge Instr - AVS First Page (Signed)
[facility]    Discharge Instructions for Cataract Surgery  A surgeon removed the cloudy lens in your eye and replaced it with a clear manmade lens. Be sure to have an adult family member or friend drive you home after surgery. Here's what you can expect following surgery and tips for a healthy recovery.  It is normal to have the following:   Bruised or bloodshot eye for 7 days   Scratchy, sand like feeling in the eye for 2 weeks   Tired feeling, especially during the first 24 hours  Activity   Do not drive for 1 day.   Do not drink alcohol for at least 24 hours.   Relax for the first 24 hours after surgery. Watching TV and reading are OK and won't harm your eye.   Be careful of curbs when walking or using stairs; hold onto rails.  Diet  . Diet as tolerated.  . Drink extra fluids, if your doctor allows.  Eye Protection   Do not rub your eye.   Wear your eye patch for one week while sleeping or napping.   Wear glasses during the day.  Glasses can be either your prescription or sunglasses.  Using Eye drops  Follow Dr. Deibert's eye drop instructions.  Be sure to wash your hands first.   Here is one way to use eye drops:   Tilt your head back.   Pull your bottom eyelid down.   Squeeze one drop into your eye. Do not touch your eye with the bottle tip.   Close your eyes for a few seconds.  If you need more than one drop, wait a few minutes before adding the next one.   Follow-up Care  You have an appointment with Dr. Deibert tomorrow.  Call your doctor right away if you have any of the following:   Bleeding or discharge from the eye   Your vision suddenly becomes worse   Pain not relieved by the pain reliever you're told to take   Nausea or vomiting  Chills or fever over 100.4F  Physician Phone Numbers  Dr. Deibert:  [phone]  Dr. Revanth Neidig:  [phone]

## 2016-08-07 NOTE — Anesthesia Preprocedure Evaluation (Signed)
Anesthesia Evaluation    AIRWAY    Mallampati: III    TM distance: >3 FB  Neck ROM: full  Mouth Opening:full   CARDIOVASCULAR    cardiovascular exam normal       DENTAL    no notable dental hx     PULMONARY    pulmonary exam normal     OTHER FINDINGS              Relevant Problems   No active problems are marked relevant to this note.               Anesthesia Plan    ASA 2     MAC                             Post op pain management: per surgeon    informed consent obtained      pertinent labs reviewed             Signed by: Betsey Holiday 08/07/16 11:06 AM

## 2016-08-07 NOTE — Anesthesia Postprocedure Evaluation (Signed)
Anesthesia Post Evaluation    Patient: Brittany Berry    Procedures performed: Procedure(s):  EXTRACTION, CATARACT, PHACO, IOL    Anesthesia type: MAC    Patient location:PACU    Last vitals:   Vitals:    08/07/16 1218   BP: 135/85   Pulse: 73   Resp: 20   Temp: 36.5 C (97.7 F)   SpO2: 97%       Post pain: Patient not complaining of pain, continue current therapy      Mental Status:awake    Respiratory Function: tolerating room air    Cardiovascular: stable    Nausea/Vomiting: patient not complaining of nausea or vomiting    Hydration Status: adequate    Post assessment: no apparent anesthetic complications, no reportable events and no evidence of recall

## 2016-08-07 NOTE — Op Note (Signed)
FULL OPERATIVE NOTE    Date Time: 08/07/2016 12:14 PM  Patient Name: [name]  Attending Physician: Su Grand, MD      Date of Operation:   08/07/2016    Providers Performing:   Surgeon(s):  Su Grand, MD    Operative Procedure:   Planned phacoemulsification with posterior chamber intraocular lens left eye.    Implant Name Type Inv. Item Serial No. Manufacturer Lot No. LRB No. Used Action   LENS HOYA M250 17.50 - [id number]   LENS HOYA M250 17.50 [id number]   Left 1 Implanted       Anesthesia:   Monitor Anesthesia Care    Preoperative Diagnosis:   Pre-Op Diagnosis Codes:     * Cataract of left eye, unspecified cataract type [H26.9]    Postoperative Diagnosis:   * cataract os    Indications:   Patient is a 71 y.o. with diminished vision in the left eye, was noted to have a cataract in this eye and it was elected to have the cataract removed.  Funduscopic examination was normal.  Because of diminished vision it was elected to remove the cataract from the left eye.    Operative Notes:   The patient was taken to the Operating Suite and was placed in supine position.  Topical anesthesia was placed in the left eye using tetracaine 0.5%.  The eye was then prepped and draped for surgery.  The operating microscope was swung into place and lid speculum placed in the left eye.  Attention was directed to the limbus where a 3.0 mms mark was made at the clear cornea.  A 69 blade was used to make a temporal incision at this point and a scleral tunnel was dissected out followed by a keratotomy blade for 3.0 mms in the anterior chamber.  Occucoat was instilled into the anterior chamber and an anterior capsulotomy followed by hydrodissection was performed.  This was done without difficulty.  Nuclear removal was used by using phacoemulsification hand piece.  A [employer] posterior chamber lens was used.  Cortical clean up was performed with the I/A hand piece and the posterior capsule polished.  Occucoat  was re-instilled into the anterior chamber and a posterior chamber intraocular lens was then inserted without difficulty into the capsular bag.  It was noted to be in good position.  The Occucoat was removed using the I/A hand piece and BSS was re-infused into the anterior chamber.  The wound was checked and found to be watertight.  The speculum was removed.  Neo-Decadron drop was placed in th eye, the eye was patched.  The patient was taken from the Operating Suite in satisfactory condition.    Estimated Blood Loss:      none  Specimens:   none    Complications:   * none  Signed by: Su Grand, MD

## 2016-08-08 ENCOUNTER — Encounter: Payer: Self-pay | Admitting: Ophthalmology

## 2016-08-23 ENCOUNTER — Emergency Department
Admission: EM | Admit: 2016-08-23 | Discharge: 2016-08-23 | Disposition: A | Discharge status: Home / Self Care | Payer: No Typology Code available for payment source | Attending: Family Medicine | Admitting: Family Medicine

## 2016-08-23 ENCOUNTER — Ambulatory Visit (RURAL_HEALTH_CENTER): Payer: No Typology Code available for payment source | Admitting: Family

## 2016-08-23 ENCOUNTER — Emergency Department: Payer: No Typology Code available for payment source

## 2016-08-23 VITALS — BP 134/87 | HR 87 | Temp 99.5°F | Resp 18

## 2016-08-23 DIAGNOSIS — R519 Headache, unspecified: Secondary | ICD-10-CM

## 2016-08-23 DIAGNOSIS — R51 Headache: Secondary | ICD-10-CM | POA: Insufficient documentation

## 2016-08-23 DIAGNOSIS — I10 Essential (primary) hypertension: Secondary | ICD-10-CM

## 2016-08-23 NOTE — ED Provider Notes (Signed)
Physician/Midlevel provider first contact with patient: 08/23/16 2129         History     Chief Complaint   Patient presents with   . Headache   . Hypertension     HPI     Presents to the ER requesting a head CT.  She developed "discomfort" in her right parietal region earlier today and checked her BP with her home cuff and it was 170/115.  She presented to the extended hours clinic and her BP was 134/87.  Her headache is unchanged.  She denies any fever, no sinus symptoms, no jaw claudication, no chest pain, palpitations, shortness of breath, or wheezing.  No photophobia or other visual symptoms.  No other complaints.    Past Medical History:   Diagnosis Date   . Arthritis    . Chronic headache 03/30/2013   . Chronic right-sided low back pain without sciatica 02/12/2016   . Cyst of breast    . Depression    . Diabetes mellitus     [facility]- NO MEDS REQUIRED AT THIS TIME.   Marland Kitchen Dyslipidemia 03/30/2013   . Essential hypertension, benign    . GERD (gastroesophageal reflux disease) 03/30/2013   . Graves disease 03/30/2013   . High cholesterol    . Mixed hyperlipidemia    . Osteoarthritis 03/30/2013   . Osteopenia 03/30/2013   . Pre-diabetes 02/23/2014   . Restless legs syndrome (RLS)    . Tendonitis    . 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 Unspecified glaucoma(365.9)        Past Surgical History:   Procedure Laterality Date   . BREAST CYST ASPIRATION     . CATARACT EXTRACTION W/ INTRAOCULAR LENS IMPLANT  08/2013    right eye   . COLONOSCOPY  02/11/2013    Procedure: COLONOSCOPY;  Surgeon: Dineen Kid, MD;  Location: [facility];  Service: Gastroenterology;  Laterality: N/A;   . COLONOSCOPY  08/14    - colonoscopy   . DILATION AND CURETTAGE OF UTERUS     . EXCISION, BONE SPUR      left little toe    . EXTRACTION, CATARACT, PHACO, IOL Left 08/07/2016    Procedure: EXTRACTION, CATARACT, PHACO, IOL;  Surgeon: Su Grand, MD;  Location: [facility];  Service: Ophthalmology;   Laterality: Left;       Family History   Problem Relation Age of Onset   . Cancer Other      BREAST CANCER.   . Colon polyps Other    . Anesthesia problems Other    . Heart disease Mother    . Heart attack Father    . Asthma Brother    . Heart disease Maternal Grandfather    . Heart disease Paternal Grandfather    . Cancer Sister    . Arthritis Sister    . COPD Sister    . Diabetes Sister    . Vasculitis Brother        Social  Social History   Substance Use Topics   . Smoking status: Former Smoker     Quit date: 07/22/1982   . Smokeless tobacco: Never Used   . Alcohol use Yes      Comment: wine 3/week       .     Allergies   Allergen Reactions   . Codeine Nausea Only   . Prednisone      Causes  depression       Home Medications     Med List Status:  Complete Set By: Patterson Hammersmith, RN at 08/23/2016  9:34 PM                amitriptyline (ELAVIL) 10 MG tablet     Take 1 tablet (10 mg total) by mouth nightly as needed.     amLODIPine-benazepril (LOTREL 5-20) 5-20 MG per capsule     Take 1 capsule by mouth daily.     aspirin 81 MG tablet     Take 1 tablet (81 mg total) by mouth daily.     atorvastatin (LIPITOR) 20 MG tablet     TAKE 1 TABLET BY MOUTH EVERY DAY     b complex vitamins capsule     Take 1 capsule by mouth daily.     bimatoprost (LUMIGAN) 0.03 % ophthalmic drops     Place 1 drop into both eyes nightly.     brimonidine (ALPHAGAN) 0.2 % ophthalmic solution     Place 1 drop into both eyes 2 (two) times daily.     Cetirizine HCl (ZYRTEC PO)     Take 10 mg by mouth as needed.      Cholecalciferol (VITAMIN D) 1000 UNIT tablet     Take 1,000 Units by mouth daily.     CO-ENZYME Q-10 PO     Take 300 mg by mouth daily.     dorzolamide-timolol (COSOPT) 22.3-6.8 MG/ML ophthalmic solution     Apply 1 drop to eye 2 (two) times daily.     FLUoxetine (PROZAC) 10 MG capsule     TAKE ONE CAPSULE BY MOUTH EVERY DAY     ipratropium (ATROVENT) 0.06 % nasal spray     2 sprays by Nasal route 3 (three) times daily as needed for  Rhinitis.     latanoprost (XALATAN) 0.005 % ophthalmic solution          Mometasone Furoate (NASONEX NA)     50 mcg by Nasal route as needed.     Multiple Vitamin (MULTIVITAMIN) tablet     Take 1 tablet by mouth daily.     nabumetone (RELAFEN) 500 MG tablet     Take 2 tablets (1,000 mg total) by mouth daily.     Patient taking differently:  Take 1,000 mg by mouth 2 (two) times daily as needed.         Omega-3 Fatty Acids (FISH OIL BURP-LESS) 1000 MG Cap     Take 1 capsule by mouth daily.     omeprazole (PRILOSEC) 20 MG capsule     TAKE ONE CAPSULE BY MOUTH ONCE DAILY           Flagged for Removal             HYDROcodone-acetaminophen (NORCO) 5-325 MG per tablet     Take 1 tablet by mouth every 6 (six) hours as needed for Pain.           Review of Systems   Constitutional: Negative for chills and fever.   Respiratory: Negative for shortness of breath and wheezing.    Cardiovascular: Negative for chest pain and palpitations.   Gastrointestinal: Negative for constipation, diarrhea, nausea and vomiting.   Neurological: Negative.  Negative for dizziness and numbness.   All other systems reviewed and are negative.      Physical Exam    BP: 141/89, Heart Rate: 89, Temp: 98.6 F (37 C), Resp Rate: 18, SpO2: 96 %,  Weight: 93 kg    Physical Exam   Constitutional: She is oriented to person, place, and time. She appears well-developed and well-nourished. No distress.   HENT:   Head: Normocephalic and atraumatic.   Right temporal artery is not palpable and there is no temporal tenderness.   Neck: Normal range of motion. Neck supple.   Cardiovascular: Normal rate, regular rhythm and normal heart sounds.    Pulmonary/Chest: Effort normal and breath sounds normal.   Abdominal: Soft. Bowel sounds are normal. She exhibits no distension. There is no tenderness.   Lymphadenopathy:     She has no cervical adenopathy.   Neurological: She is alert and oriented to person, place, and time. She displays normal reflexes. She exhibits normal  muscle tone.   Skin: Skin is warm and dry. No rash noted.   Psychiatric: She has a normal mood and affect. Her behavior is normal.   Nursing note and vitals reviewed.    Radiology Results (24 Hour)     Procedure Component Value Units Date/Time    CT Head WO Contrast [[id number]] Collected:  08/23/16 2226    Order Status:  Completed Updated:  08/23/16 2233    Narrative:       Clinical history:  Reason For Exam: headache  Patient states headaches for 2 months becoming more frequent.    Examination:  CT HEAD WO CONTRAST    TECHNIQUE: Noncontrast serial axial images from skull base to vertex with coronal and sagittal reconstruction.  CT images were acquired utilizing automated exposure control for dose reduction.    Comparison:  None available      Findings: There is no mass effect or midline shift. There are no abnormal intra or extra-axial fluid collections. Cortical sulci and lateral ventricles are within normal limits for size and configuration. The perimesencephalic cistern is patent. No acute   intracranial hemorrhage is identified.  Mucosal thickening is seen in the maxillary sinuses. Mastoid air cells are well aerated.  No acute osseous abnormality is identified.      Impression:         1.  No abnormal mass or acute intracranial hemorrhage is identified.    ReadingStation:[id number]            MDM and ED Course     ED Medication Orders     None             MDM  Number of Diagnoses or Management Options  Risk of Complications, Morbidity, and/or Mortality  Presenting problems: low  Diagnostic procedures: low  Management options: low          ED Course              Procedures    Clinical Impression & Disposition     Clinical Impression  Final diagnoses:   Nonintractable headache, unspecified chronicity pattern, unspecified headache type        ED Disposition     ED Disposition Condition Date/Time Comment    Discharge  Fri Aug 23, 2016 10:44 PM [facility] discharge to home/self care.    Condition at  disposition: Stable           New Prescriptions    No medications on file                 Elenor Quinones, MD  08/23/16 2245

## 2016-08-23 NOTE — Progress Notes (Signed)
[facility]  [facility]     History of Presenting Illness:     Chief Complaint   Patient presents with   . Hypertension     172/115 at 7pm    . Pressure Behind the Eyes   . Headache       Brittany Berry is a 71 y.o. female who presents to the office with concern for Headaches over the last 3 months. They are typically on her right side and pulsing. Tonight she was worried it was related to blood pressure so she checked her blood pressure on her home monitor and it was 172/115. The headaches are typically about 7 out of 10, but respond well to Tylenol. She denies any changes to her lifestyle, medications, diet. She does not have a history of migraines. She does not typically check her blood pressure so does not know what it is running at home. She does not wake up with headache. She has no aura or visual changes with her headache, but she does have glaucoma and recently had cataract surgery on the left eye.    Past Medical History:     Past Medical History:   Diagnosis Date   . Arthritis    . Chronic headache 03/30/2013   . Chronic right-sided low back pain without sciatica 02/12/2016   . Cyst of breast    . Depression    . Diabetes mellitus     [facility]- NO MEDS REQUIRED AT THIS TIME.   Marland Kitchen Dyslipidemia 03/30/2013   . Essential hypertension, benign    . GERD (gastroesophageal reflux disease) 03/30/2013   . Graves disease 03/30/2013   . High cholesterol    . Mixed hyperlipidemia    . Osteoarthritis 03/30/2013   . Osteopenia 03/30/2013   . Pre-diabetes 02/23/2014   . Restless legs syndrome (RLS)    . Tendonitis    . 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 Unspecified glaucoma(365.9)        Past Surgical History:     Past Surgical History:   Procedure Laterality Date   . BREAST CYST ASPIRATION     . CATARACT EXTRACTION W/ INTRAOCULAR LENS IMPLANT  08/2013    right eye   . COLONOSCOPY  02/11/2013    Procedure: COLONOSCOPY;  Surgeon: Dineen Kid, MD;  Location:  [facility];  Service: Gastroenterology;  Laterality: N/A;   . COLONOSCOPY  08/14    - colonoscopy   . DILATION AND CURETTAGE OF UTERUS     . EXCISION, BONE SPUR      left little toe    . EXTRACTION, CATARACT, PHACO, IOL Left 08/07/2016    Procedure: EXTRACTION, CATARACT, PHACO, IOL;  Surgeon: Su Grand, MD;  Location: [facility];  Service: Ophthalmology;  Laterality: Left;       Family History:     Family History   Problem Relation Age of Onset   . Cancer Other      BREAST CANCER.   . Colon polyps Other    . Anesthesia problems Other    . Heart disease Mother    . Heart attack Father    . Asthma Brother    . Heart disease Maternal Grandfather    . Heart disease Paternal Grandfather    . Cancer Sister    . Arthritis Sister    . COPD Sister    . Diabetes Sister    . Vasculitis  Brother        Social History:     History   Smoking Status   . Former Smoker   . Quit date: 07/22/1982   Smokeless Tobacco   . Never Used     History   Alcohol Use   . Yes     Comment: wine 3/week     History   Drug Use No       Allergies:     Allergies   Allergen Reactions   . Codeine Nausea Only   . Prednisone      Causes depression       Medications:     Prior to Admission medications    Medication Sig Start Date End Date Taking? Authorizing Provider   amLODIPine-benazepril (LOTREL 5-20) 5-20 MG per capsule Take 1 capsule by mouth daily. 02/13/16  Yes Marchia Meiers, MD   aspirin 81 MG tablet Take 1 tablet (81 mg total) by mouth daily. 02/12/16  Yes Marchia Meiers, MD   atorvastatin (LIPITOR) 20 MG tablet TAKE 1 TABLET BY MOUTH EVERY DAY 07/30/16  Yes Marchia Meiers, MD   b complex vitamins capsule Take 1 capsule by mouth daily.   Yes [provider]   bimatoprost (LUMIGAN) 0.03 % ophthalmic drops Place 1 drop into both eyes nightly.   Yes [provider]   brimonidine (ALPHAGAN) 0.2 % ophthalmic solution Place 1 drop into both eyes 2 (two) times daily.   Yes [provider]    Cholecalciferol (VITAMIN D) 1000 UNIT tablet Take 1,000 Units by mouth daily.   Yes [provider]   dorzolamide-timolol (COSOPT) 22.3-6.8 MG/ML ophthalmic solution Apply 1 drop to eye 2 (two) times daily.   Yes [provider]   FLUoxetine (PROZAC) 10 MG capsule TAKE ONE CAPSULE BY MOUTH EVERY DAY 03/27/16  Yes Marchia Meiers, MD   Mometasone Furoate (NASONEX NA) 50 mcg by Nasal route as needed.   Yes [provider]   Multiple Vitamin (MULTIVITAMIN) tablet Take 1 tablet by mouth daily.   Yes [provider]   omeprazole (PRILOSEC) 20 MG capsule TAKE ONE CAPSULE BY MOUTH ONCE DAILY 06/23/15  Yes Graciela Husbands, DO   amitriptyline (ELAVIL) 10 MG tablet Take 1 tablet (10 mg total) by mouth nightly as needed. 11/07/14   Marchia Meiers, MD   Cetirizine HCl (ZYRTEC PO) Take 10 mg by mouth as needed.     [provider]   CO-ENZYME Q-10 PO Take 300 mg by mouth daily.    [provider]   HYDROcodone-acetaminophen (NORCO) 5-325 MG per tablet Take 1 tablet by mouth every 6 (six) hours as needed for Pain.    [provider]   ipratropium (ATROVENT) 0.06 % nasal spray 2 sprays by Nasal route 3 (three) times daily as needed for Rhinitis. 11/07/14   Marchia Meiers, MD   latanoprost (XALATAN) 0.005 % ophthalmic solution  02/18/14   [provider]   nabumetone (RELAFEN) 500 MG tablet Take 2 tablets (1,000 mg total) by mouth daily.  Patient taking differently: Take 1,000 mg by mouth 2 (two) times daily as needed.     05/08/15   Marchia Meiers, MD   Omega-3 Fatty Acids (FISH OIL BURP-LESS) 1000 MG Cap Take 1 capsule by mouth daily.    [provider]       Review of Systems:   Review of Systems   Constitutional: Negative for chills, fever and malaise/fatigue.  HENT: Negative for congestion and ear pain.    Eyes: Negative for photophobia.   Neurological: Positive for headaches. Negative for dizziness and tingling.         Physical  Exam:     Vitals:    08/23/16 2053   BP: 134/87   Pulse:    Resp:    Temp:    SpO2:      There is no height or weight on file to calculate BMI.    Physical Exam   Constitutional: She is oriented to person, place, and time. She appears well-developed and well-nourished. No distress.   HENT:   Head: Normocephalic and atraumatic.   Right Ear: External ear normal.   Left Ear: External ear normal.   Eyes: Conjunctivae and EOM are normal. Pupils are equal, round, and reactive to light. Right eye exhibits no discharge. Left eye exhibits no discharge.   Neck: Normal range of motion. Neck supple.   Cardiovascular: Normal rate, regular rhythm and normal heart sounds.    Pulmonary/Chest: Effort normal and breath sounds normal.   Lymphadenopathy:     She has no cervical adenopathy.   Neurological: She is alert and oriented to person, place, and time. She has normal strength. No cranial nerve deficit or sensory deficit. She exhibits normal muscle tone. Coordination normal.   Skin: Skin is warm and dry. She is not diaphoretic.   Psychiatric: She has a normal mood and affect. Her behavior is normal. Judgment and thought content normal.   Nursing note and vitals reviewed.      Assessment:     1. Nonintractable headache, unspecified chronicity pattern, unspecified headache type     2. Essential hypertension, benign         Plan:   Blood pressure was good in the office. We discussed bringing her monitor in to be checked against ours for accuracy. The headaches do not sound alarming, however, since the onset has been in her 57s and they are increasing in duration and frequency I recommended a head CT scan. She did not want to wait for outpatient, so I sent her to the ER to be evaluated this evening.    Signed by: [name], FNP-BC

## 2016-08-23 NOTE — Discharge Instructions (Signed)
Self-Care for Headaches  Most headaches aren't serious and can be relieved with self-care. But some headaches may be a sign of another health problem like eye trouble or high blood pressure. To find the best treatment, learn what kind of headaches you get. For tension headaches, self-care will usually help. To treat migraines, ask yourhealthcare providerfor advice. It is also possible to get both tension and migraine headaches. Self-care involves relieving the pain and avoiding headache "triggers" if you can.    Ways to reduce pain and tension  Try these steps:   Apply a cold compress or ice pack to the pain site.   Drink fluids. If nausea makes it hard to drink, try sucking on ice.   Rest. Protect yourself from bright light and loud noises.   Calm your emotions by imagining a peaceful scene.   Massage tight neck, shoulder, and head muscles.   To relax muscles, soak in a hot bath or use a hot shower.  Use medicines  Aspirin or aspirin substitutes, such as ibuprofen and acetaminophen, can relieve headache. Remember: Never give aspirin to anyone 18 years old or younger because of the risk of developing Reye syndrome. Use pain medicines only when necessary.  Track your headaches  Keeping a headache diary can help you and yourhealthcare provideridentify what's causing your headaches:   Note when each headache happens.   Identify your activities and the foods you've eaten6 to 8hours before the headache began.   Look for any trends or "triggers."  Signs of tension headache  Any of the following can be signs:   Dull pain or feeling of pressure in a tight band around your head   Pain in your neck or shoulders   Headache without a definite beginning or end   Headache after an activity such as driving or working on a computer  Signs of migraine  Any of the following can be signs:   Throbbing pain on one or both sides of your head   Nausea or vomiting   Extreme sensitivity to light, sound, and smells    Bright spots, flashes, or other visual changes   Pain or nausea so severe that you can't continue your daily activities  Call yourhealthcare provider  If you have any of the following symptoms, contact your healthcare provider:   A headache that lingers after a recent injury or bump to the head.   A fever with a stiff neck or pain when you bend your head toward your chest.   A headache along with slurred speech, changes in your vision, or numbness or weakness in your arms or legs.   A headache for longer than3 days.   Frequent headaches,especially in the morning.   Headaches with seizures   Seek immediate medical attention if you have a headache that you would call "the worst headache you have ever had."   Date Last Reviewed: 04/24/2014   2000-2017 [employer] [address], [place] [address]. All rights reserved. This information is not intended as a substitute for professional medical care. Always follow your healthcare professional's instructions.

## 2016-08-23 NOTE — ED Triage Notes (Signed)
Pt was at [facility] with headache and HTN x 2 months and headaches are getting worse.   [facility] sent pt for CT of head because of headaches that she has been having.  (headaches come and go over past 2 months)

## 2016-08-29 ENCOUNTER — Other Ambulatory Visit (RURAL_HEALTH_CENTER): Payer: Self-pay | Admitting: Internal Medicine

## 2016-10-23 ENCOUNTER — Ambulatory Visit (RURAL_HEALTH_CENTER): Payer: No Typology Code available for payment source | Admitting: Family Medicine

## 2016-10-24 ENCOUNTER — Ambulatory Visit (RURAL_HEALTH_CENTER): Payer: No Typology Code available for payment source | Admitting: Family Medicine

## 2016-10-30 ENCOUNTER — Ambulatory Visit: Payer: No Typology Code available for payment source | Attending: Family Medicine | Admitting: Family Medicine

## 2016-10-30 ENCOUNTER — Encounter (RURAL_HEALTH_CENTER): Payer: Self-pay | Admitting: Family Medicine

## 2016-10-30 ENCOUNTER — Other Ambulatory Visit
Admission: RE | Admit: 2016-10-30 | Discharge: 2016-10-30 | Disposition: A | Discharge status: Home / Self Care | Payer: No Typology Code available for payment source | Source: Ambulatory Visit | Attending: Family Medicine | Admitting: Family Medicine

## 2016-10-30 VITALS — BP 131/67 | HR 65 | Temp 97.8°F | Resp 18 | Wt 209.0 lb

## 2016-10-30 DIAGNOSIS — E785 Hyperlipidemia, unspecified: Secondary | ICD-10-CM

## 2016-10-30 DIAGNOSIS — E0500 Thyrotoxicosis with diffuse goiter without thyrotoxic crisis or storm: Secondary | ICD-10-CM

## 2016-10-30 DIAGNOSIS — G3184 Mild cognitive impairment, so stated: Secondary | ICD-10-CM

## 2016-10-30 DIAGNOSIS — J301 Allergic rhinitis due to pollen: Secondary | ICD-10-CM

## 2016-10-30 DIAGNOSIS — M159 Polyosteoarthritis, unspecified: Secondary | ICD-10-CM

## 2016-10-30 DIAGNOSIS — G2581 Restless legs syndrome: Secondary | ICD-10-CM

## 2016-10-30 DIAGNOSIS — K219 Gastro-esophageal reflux disease without esophagitis: Secondary | ICD-10-CM

## 2016-10-30 DIAGNOSIS — R7303 Prediabetes: Secondary | ICD-10-CM

## 2016-10-30 DIAGNOSIS — I10 Essential (primary) hypertension: Secondary | ICD-10-CM

## 2016-10-30 DIAGNOSIS — M545 Low back pain: Secondary | ICD-10-CM

## 2016-10-30 DIAGNOSIS — G8929 Other chronic pain: Secondary | ICD-10-CM

## 2016-10-30 DIAGNOSIS — M150 Primary generalized (osteo)arthritis: Secondary | ICD-10-CM

## 2016-10-30 HISTORY — DX: Mild cognitive impairment of uncertain or unknown etiology: G31.84

## 2016-10-30 LAB — CBC AND DIFFERENTIAL
Basophils %: 1.4 % (ref 0.0–3.0)
Basophils Absolute: 0.1 10*3/uL (ref 0.0–0.3)
Eosinophils %: 5.1 % (ref 0.0–7.0)
Eosinophils Absolute: 0.3 10*3/uL (ref 0.0–0.8)
Hematocrit: 45.3 % (ref 36.0–48.0)
Hemoglobin: 14.8 gm/dL (ref 12.0–16.0)
Lymphocytes Absolute: 2.2 10*3/uL (ref 0.6–5.1)
Lymphocytes: 38.4 % (ref 15.0–46.0)
MCH: 30 pg (ref 28–35)
MCHC: 33 gm/dL (ref 32–36)
MCV: 93 fL (ref 80–100)
MPV: 9.2 fL (ref 6.0–10.0)
Monocytes Absolute: 0.4 10*3/uL (ref 0.1–1.7)
Monocytes: 7.1 % (ref 3.0–15.0)
Neutrophils %: 48 % (ref 42.0–78.0)
Neutrophils Absolute: 2.8 10*3/uL (ref 1.7–8.6)
PLT CT: 305 10*3/uL (ref 130–440)
RBC: 4.89 10*6/uL (ref 3.80–5.00)
RDW: 12.3 % (ref 11.0–14.0)
WBC: 5.8 10*3/uL (ref 4.0–11.0)

## 2016-10-30 LAB — COMPREHENSIVE METABOLIC PANEL
ALT: 17 U/L (ref 0–55)
AST (SGOT): 23 U/L (ref 10–42)
Albumin/Globulin Ratio: 1.16 Ratio (ref 0.70–1.50)
Albumin: 4.4 gm/dL (ref 3.5–5.0)
Alkaline Phosphatase: 94 U/L (ref 40–145)
Anion Gap: 13.6 mMol/L (ref 7.0–18.0)
BUN / Creatinine Ratio: 13 Ratio (ref 10.0–30.0)
BUN: 10 mg/dL (ref 7–22)
Bilirubin, Total: 0.8 mg/dL (ref 0.1–1.2)
CO2: 24.6 mMol/L (ref 20.0–30.0)
Calcium: 10.3 mg/dL (ref 8.5–10.5)
Chloride: 103 mMol/L (ref 98–110)
Creatinine: 0.77 mg/dL (ref 0.60–1.20)
EGFR: 78 mL/min/{1.73_m2} (ref 60–150)
Globulin: 3.8 gm/dL (ref 2.0–4.0)
Glucose: 108 mg/dL — ABNORMAL HIGH (ref 71–99)
Osmolality Calc: 273 mOsm/kg — ABNORMAL LOW (ref 275–300)
Potassium: 4.2 mMol/L (ref 3.5–5.3)
Protein, Total: 8.2 gm/dL (ref 6.0–8.3)
Sodium: 137 mMol/L (ref 136–147)

## 2016-10-30 LAB — LIPID PANEL
Cholesterol: 219 mg/dL — ABNORMAL HIGH (ref 75–199)
Coronary Heart Disease Risk: 3.04
HDL: 72 mg/dL — ABNORMAL HIGH (ref 45–65)
LDL Calculated: 119 mg/dL
Triglycerides: 141 mg/dL (ref 10–150)
VLDL: 28 (ref 0–40)

## 2016-10-30 LAB — TSH: TSH: 0.81 u[IU]/mL (ref 0.40–4.20)

## 2016-10-30 LAB — HEMOGLOBIN A1C: Hgb A1C, %: 5.3 %

## 2016-10-30 LAB — T3, FREE: T3, Free: 2.6 pg/mL (ref 1.71–3.71)

## 2016-10-30 LAB — T4, FREE: T4 Free: 0.87 ng/dL (ref 0.70–1.48)

## 2016-10-30 NOTE — Progress Notes (Signed)
Subjective:    Patient ID: Brittany Berry is a 71 y.o. female.    HPI Brittany Berry presents today for her routine care and checkup. She is a pleasant 71 year old white female. Her medical problem list as noted in her past medical history.  She has a new concern today. She thinks she's having some problems with short-term memory. She notices if she puts things down in the house frequently she will not be able to find them. Also when she parked her car when going to store if she is in a large parking lot sometimes she will not remember where she parked. She is always left the executive function of her household to her husband that she does not pay the bills or keep financial records. She's not getting lost when she drives. She is just concerned that sometimes she forgets conversations or things that she's done in the past. These symptoms have been a little more noticeable the last 3-4 months.  In February she had a visit to the emergency department and a CT scan was performed of her brain. This was negative although it did show some maxillary sinus mucosal thickening. I mention that for 2 reasons. One she is complaining of some mild memory loss and her CT scan was normal of her brain. She also has been noticing some postnasal drip type symptoms with congestion in her throat. She denies any chest congestion per se or cough. There's been no fever or chills.  Otherwise she feels well. She does have areas aches and pains from her arthritis. She suffers from chronic intermittent low back pain if she does too much yard work. She is prediabetic. She is moderately overweight. She has a history of Graves' disease but on no medication.  Past Medical History:   Diagnosis Date   . Arthritis    . Chronic headache 03/30/2013   . Chronic right-sided low back pain without sciatica 02/12/2016   . Cyst of breast    . Depression    . Diabetes mellitus     [facility]- NO MEDS REQUIRED AT THIS TIME.   Marland Kitchen Dyslipidemia 03/30/2013   .  Essential hypertension, benign    . GERD (gastroesophageal reflux disease) 03/30/2013   . Graves disease 03/30/2013   . High cholesterol    . Mild cognitive impairment with memory loss 10/30/2016   . Mixed hyperlipidemia    . Osteoarthritis 03/30/2013   . Osteopenia 03/30/2013   . Pre-diabetes 02/23/2014   . Restless legs syndrome (RLS)    . Tendonitis    . 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 Unspecified glaucoma(365.9)      Past Surgical History:   Procedure Laterality Date   . BREAST CYST ASPIRATION     . CATARACT EXTRACTION W/ INTRAOCULAR LENS IMPLANT  08/2013    right eye   . COLONOSCOPY  02/11/2013    Procedure: COLONOSCOPY;  Surgeon: Dineen Kid, MD;  Location: [facility];  Service: Gastroenterology;  Laterality: N/A;   . COLONOSCOPY  08/14    - colonoscopy   . DILATION AND CURETTAGE OF UTERUS     . EXCISION, BONE SPUR      left little toe    . EXTRACTION, CATARACT, PHACO, IOL Left 08/07/2016    Procedure: EXTRACTION, CATARACT, PHACO, IOL;  Surgeon: Su Grand, MD;  Location: [facility];  Service: Ophthalmology;  Laterality: Left;     Family History   Problem  Relation Age of Onset   . Cancer Other      BREAST CANCER.   . Colon polyps Other    . Anesthesia problems Other    . Heart disease Mother    . Heart attack Father    . Asthma Brother    . Heart disease Maternal Grandfather    . Heart disease Paternal Grandfather    . Cancer Sister    . Arthritis Sister    . COPD Sister    . Diabetes Sister    . Vasculitis Brother      Social History     Social History   . Marital status: Married     Spouse name: N/A   . Number of children: N/A   . Years of education: N/A     Occupational History   . Not on file.     Social History Main Topics   . Smoking status: Former Smoker     Quit date: 07/22/1982   . Smokeless tobacco: Never Used   . Alcohol use Yes      Comment: wine 3/week   . Drug use: No   . Sexual activity: Not on file     Other Topics Concern   . Not on file      Social History Narrative   . No narrative on file     Current Outpatient Prescriptions on File Prior to Visit   Medication Sig Dispense Refill   . amitriptyline (ELAVIL) 10 MG tablet Take 1 tablet (10 mg total) by mouth nightly as needed. 90 tablet 2   . amLODIPine-benazepril (LOTREL 5-20) 5-20 MG per capsule Take 1 capsule by mouth daily. 90 capsule 4   . aspirin 81 MG tablet Take 1 tablet (81 mg total) by mouth daily. 100 tablet 4   . atorvastatin (LIPITOR) 20 MG tablet TAKE 1 TABLET BY MOUTH EVERY DAY 90 tablet 3   . b complex vitamins capsule Take 1 capsule by mouth daily.     . bimatoprost (LUMIGAN) 0.03 % ophthalmic drops Place 1 drop into both eyes nightly.     . brimonidine (ALPHAGAN) 0.2 % ophthalmic solution Place 1 drop into both eyes 2 (two) times daily.     . Cetirizine HCl (ZYRTEC PO) Take 10 mg by mouth as needed.      . Cholecalciferol (VITAMIN D) 1000 UNIT tablet Take 1,000 Units by mouth daily.     Marland Kitchen CO-ENZYME Q-10 PO Take 300 mg by mouth daily.     . dorzolamide-timolol (COSOPT) 22.3-6.8 MG/ML ophthalmic solution Apply 1 drop to eye 2 (two) times daily.     Marland Kitchen FLUoxetine (PROZAC) 10 MG capsule TAKE ONE CAPSULE BY MOUTH EVERY DAY 90 capsule 3   . HYDROcodone-acetaminophen (NORCO) 5-325 MG per tablet Take 1 tablet by mouth every 6 (six) hours as needed for Pain.     Marland Kitchen ipratropium (ATROVENT) 0.06 % nasal spray 2 sprays by Nasal route 3 (three) times daily as needed for Rhinitis. 15 mL 5   . latanoprost (XALATAN) 0.005 % ophthalmic solution      . Multiple Vitamin (MULTIVITAMIN) tablet Take 1 tablet by mouth daily.     . nabumetone (RELAFEN) 500 MG tablet Take 2 tablets (1,000 mg total) by mouth daily. (Patient taking differently: Take 1,000 mg by mouth 2 (two) times daily as needed.    ) 60 tablet 3   . Omega-3 Fatty Acids (FISH OIL BURP-LESS) 1000 MG Cap Take 1 capsule by mouth daily.     Marland Kitchen  omeprazole (PRILOSEC) 20 MG capsule TAKE ONE CAPSULE BY MOUTH ONCE DAILY 90 capsule 0   . Mometasone  Furoate (NASONEX NA) 50 mcg by Nasal route as needed.       No current facility-administered medications on file prior to visit.          The following portions of the patient's history were reviewed and updated as appropriate: allergies, current medications, past family history, past medical history, past social history, past surgical history and problem list.    Review of Systems   Constitutional:        Positive review of systems are noted in her history of present illness. Otherwise review of systems are negative.   All other systems reviewed and are negative.          Objective:    Physical Exam   Constitutional: She is oriented to person, place, and time. She appears well-developed. No distress.   HENT:   Head: Normocephalic and atraumatic.   Right Ear: External ear normal.   Left Ear: External ear normal.   Nose: Nose normal.   Mouth/Throat: Oropharynx is clear and moist. No oropharyngeal exudate.   Eyes: Conjunctivae and EOM are normal. Pupils are equal, round, and reactive to light. Right eye exhibits no discharge. Left eye exhibits no discharge. No scleral icterus.   Neck: Normal range of motion. Neck supple. No JVD present. No tracheal deviation present. No thyromegaly present.   Cardiovascular: Normal rate, regular rhythm, normal heart sounds and intact distal pulses.  Exam reveals no gallop and no friction rub.    No murmur heard.  Pulmonary/Chest: Effort normal and breath sounds normal. No stridor. No respiratory distress. She has no wheezes. She has no rales. She exhibits no tenderness.   Abdominal: Soft. Bowel sounds are normal. She exhibits no distension and no mass. There is no tenderness. There is no rebound and no guarding.   Musculoskeletal: Normal range of motion. She exhibits no edema or tenderness.   Lymphadenopathy:     She has no cervical adenopathy.   Neurological: She is alert and oriented to person, place, and time. She has normal reflexes. She displays normal reflexes. No cranial nerve  deficit or sensory deficit. She exhibits normal muscle tone. Coordination normal.   Mini-Mental Status Examination was performed and the patient was able to perform all tasks without any deficits. She did draw the face of a clock reasonably accurately. Serial sevens were also done accurately as was spelling world backwards.   Skin: Skin is warm and dry. No rash noted. She is not diaphoretic. No erythema. No pallor.   Psychiatric: She has a normal mood and affect. Her behavior is normal. Judgment and thought content normal.   Nursing note and vitals reviewed.          Assessment:       1. Essential hypertension, benign    2. Mild cognitive impairment with memory loss    3. Pre-diabetes    4. Graves disease    5. Dyslipidemia    6. Gastroesophageal reflux disease, esophagitis presence not specified    7. Primary osteoarthritis involving multiple joints    8. Restless legs syndrome (RLS)    9. Chronic right-sided low back pain without sciatica    10. Acute seasonal allergic rhinitis due to pollen          Plan:       1. We discussed her perceived mild memory impairment. At this point in time we have elected  to monitor. Her CT scan was negative.  2. She'll continue current medical therapy. Labs drawn today include CBC, CMP, lipids, thyroid function and hemoglobin A1c.  3.The patient was recommended a whole food no added sugar low sodium diet along with an exercise program 4-5 days a week for 45 minutes daily to promote weight loss. They were also advised to eat 3-4 small meals a day as well as not eating late at night.  4. Follow-up 3 months

## 2016-10-31 ENCOUNTER — Other Ambulatory Visit (RURAL_HEALTH_CENTER): Payer: Self-pay

## 2016-10-31 DIAGNOSIS — S8012XA Contusion of left lower leg, initial encounter: Secondary | ICD-10-CM

## 2016-11-04 MED ORDER — NABUMETONE 500 MG PO TABS
1000.0000 mg | ORAL_TABLET | Freq: Every day | ORAL | 3 refills | Status: DC
Start: 2016-11-04 — End: 2017-11-17

## 2016-11-25 ENCOUNTER — Other Ambulatory Visit (RURAL_HEALTH_CENTER): Payer: Self-pay | Admitting: Internal Medicine

## 2016-11-25 ENCOUNTER — Other Ambulatory Visit (RURAL_HEALTH_CENTER): Payer: Self-pay | Admitting: Family Medicine

## 2017-01-29 ENCOUNTER — Ambulatory Visit (RURAL_HEALTH_CENTER): Payer: No Typology Code available for payment source | Admitting: Family Medicine

## 2017-02-05 ENCOUNTER — Ambulatory Visit (RURAL_HEALTH_CENTER): Payer: No Typology Code available for payment source | Admitting: Family Medicine

## 2017-02-17 ENCOUNTER — Encounter (RURAL_HEALTH_CENTER): Payer: Self-pay | Admitting: Family Medicine

## 2017-02-17 ENCOUNTER — Other Ambulatory Visit
Admission: RE | Admit: 2017-02-17 | Discharge: 2017-02-17 | Disposition: A | Discharge status: Home / Self Care | Payer: No Typology Code available for payment source | Source: Ambulatory Visit | Attending: Family Medicine | Admitting: Family Medicine

## 2017-02-17 ENCOUNTER — Ambulatory Visit: Payer: No Typology Code available for payment source | Attending: Family Medicine | Admitting: Family Medicine

## 2017-02-17 VITALS — BP 130/75 | HR 73 | Temp 97.8°F | Resp 18 | Wt 210.0 lb

## 2017-02-17 DIAGNOSIS — E0500 Thyrotoxicosis with diffuse goiter without thyrotoxic crisis or storm: Secondary | ICD-10-CM

## 2017-02-17 DIAGNOSIS — E785 Hyperlipidemia, unspecified: Secondary | ICD-10-CM

## 2017-02-17 DIAGNOSIS — K219 Gastro-esophageal reflux disease without esophagitis: Secondary | ICD-10-CM | POA: Insufficient documentation

## 2017-02-17 DIAGNOSIS — G2581 Restless legs syndrome: Secondary | ICD-10-CM

## 2017-02-17 DIAGNOSIS — I10 Essential (primary) hypertension: Secondary | ICD-10-CM | POA: Insufficient documentation

## 2017-02-17 DIAGNOSIS — M545 Low back pain, unspecified: Secondary | ICD-10-CM

## 2017-02-17 DIAGNOSIS — R7303 Prediabetes: Secondary | ICD-10-CM | POA: Insufficient documentation

## 2017-02-17 LAB — VH POCT URINALYSIS (DIPSTICK)
Bilirubin, UA POCT: NEGATIVE
Glucose, UA POCT: NEGATIVE mg/dL
Ketones, UA POCT: NEGATIVE mg/dL
Nitrite, UA POCT: NEGATIVE
POCT Spec Gravity, UA: 1 — AB (ref 1.001–1.035)
POCT pH, UA: 6.5 (ref 5–8)
Protein, UA POCT: NEGATIVE mg/dL
Urobilinogen, UA: 0.2 mg/dL

## 2017-02-17 LAB — CBC AND DIFFERENTIAL
Basophils %: 1.2 % (ref 0.0–3.0)
Basophils Absolute: 0.1 10*3/uL (ref 0.0–0.3)
Eosinophils %: 3.2 % (ref 0.0–7.0)
Eosinophils Absolute: 0.2 10*3/uL (ref 0.0–0.8)
Hematocrit: 44.6 % (ref 36.0–48.0)
Hemoglobin: 14.7 gm/dL (ref 12.0–16.0)
Lymphocytes Absolute: 2.2 10*3/uL (ref 0.6–5.1)
Lymphocytes: 34.6 % (ref 15.0–46.0)
MCH: 31 pg (ref 28–35)
MCHC: 33 gm/dL (ref 32–36)
MCV: 94 fL (ref 80–100)
MPV: 9.3 fL (ref 6.0–10.0)
Monocytes Absolute: 0.4 10*3/uL (ref 0.1–1.7)
Monocytes: 6.6 % (ref 3.0–15.0)
Neutrophils %: 54.5 % (ref 42.0–78.0)
Neutrophils Absolute: 3.5 10*3/uL (ref 1.7–8.6)
PLT CT: 282 10*3/uL (ref 130–440)
RBC: 4.74 10*6/uL (ref 3.80–5.00)
RDW: 11.6 % (ref 11.0–14.0)
WBC: 6.4 10*3/uL (ref 4.0–11.0)

## 2017-02-17 LAB — COMPREHENSIVE METABOLIC PANEL
ALT: 26 U/L (ref 0–55)
AST (SGOT): 27 U/L (ref 10–42)
Albumin/Globulin Ratio: 1.24 Ratio (ref 0.70–1.50)
Albumin: 4.6 gm/dL (ref 3.5–5.0)
Alkaline Phosphatase: 81 U/L (ref 40–145)
Anion Gap: 12.7 mMol/L (ref 7.0–18.0)
BUN / Creatinine Ratio: 16.3 Ratio (ref 10.0–30.0)
BUN: 13 mg/dL (ref 7–22)
Bilirubin, Total: 1.1 mg/dL (ref 0.1–1.2)
CO2: 27.6 mMol/L (ref 20.0–30.0)
Calcium: 10.2 mg/dL (ref 8.5–10.5)
Chloride: 103 mMol/L (ref 98–110)
Creatinine: 0.8 mg/dL (ref 0.60–1.20)
EGFR: 74 mL/min/{1.73_m2} (ref 60–150)
Globulin: 3.7 gm/dL (ref 2.0–4.0)
Glucose: 96 mg/dL (ref 71–99)
Osmolality Calc: 278 mOsm/kg (ref 275–300)
Potassium: 4.3 mMol/L (ref 3.5–5.3)
Protein, Total: 8.3 gm/dL (ref 6.0–8.3)
Sodium: 139 mMol/L (ref 136–147)

## 2017-02-17 LAB — LIPID PANEL
Cholesterol: 188 mg/dL (ref 75–199)
Coronary Heart Disease Risk: 2.94
HDL: 64 mg/dL (ref 45–65)
LDL Calculated: 95 mg/dL
Triglycerides: 146 mg/dL (ref 10–150)
VLDL: 29 (ref 0–40)

## 2017-02-17 LAB — T4, FREE: T4 Free: 0.96 ng/dL (ref 0.70–1.48)

## 2017-02-17 LAB — TSH: TSH: 2.25 u[IU]/mL (ref 0.40–4.20)

## 2017-02-17 LAB — T3, FREE: T3, Free: 2.5 pg/mL (ref 1.71–3.71)

## 2017-02-17 NOTE — Progress Notes (Signed)
Subjective:    Patient ID: Brittany Berry is a 71 y.o. female.    HPI Brittany Berry returns today for medical management. She is a delightful 71 year old white female with multiple medical problems including hypertension, dyslipidemia, restless leg syndrome, chronic insomnia, history of headaches, depression, prediabetes/type 2 diabetes mild osteopenia and osteoarthritis. He also has a history of Graves' disease but is not on any medication.  She reports not taking her amitriptyline in quite some time. Headaches currently or not a factor. Occasionally she'll have difficulty falling asleep or staying asleep and is wondering whether she can take Tylenol PM as opposed to the amitriptyline which she has not been using.  She does have a history of chronic right-sided low back pain with right-sided sciatica but that is not a factor at present time either. She has however developed some left-sided lower back pain without sciatica. It seems worse when she first gets out of bed in the morning with a lot of stiffness and soreness but then improves throughout the day as she moves around in her muscles warm up. Urinary symptoms.  She also remarks that she has an inordinate amount of seborrheic keratoses and is wondering if I know anything about how it to prevent these. She also has a large number of skin tags. These are around her neck.  In reviewing her chart her last BMD was 1 year ago. She also had a mammogram a year ago and realizes that she needs to schedule mammogram.    Past Medical History:   Diagnosis Date   . Arthritis    . Chronic headache 03/30/2013   . Chronic right-sided low back pain without sciatica 02/12/2016   . Cyst of breast    . Depression    . Diabetes mellitus     [facility]- NO MEDS REQUIRED AT THIS TIME.   Marland Kitchen Dyslipidemia 03/30/2013   . Essential hypertension, benign    . GERD (gastroesophageal reflux disease) 03/30/2013   . Graves disease 03/30/2013   . High cholesterol    . Mild cognitive impairment with  memory loss 10/30/2016   . Mixed hyperlipidemia    . Osteoarthritis 03/30/2013   . Osteopenia 03/30/2013   . Pre-diabetes 02/23/2014   . Restless legs syndrome (RLS)    . Tendonitis    . 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 Unspecified glaucoma(365.9)      Past Surgical History:   Procedure Laterality Date   . BREAST CYST ASPIRATION     . CATARACT EXTRACTION W/ INTRAOCULAR LENS IMPLANT  08/2013    right eye   . COLONOSCOPY  02/11/2013    Procedure: COLONOSCOPY;  Surgeon: Dineen Kid, MD;  Location: [facility];  Service: Gastroenterology;  Laterality: N/A;   . COLONOSCOPY  08/14    - colonoscopy   . DILATION AND CURETTAGE OF UTERUS     . EXCISION, BONE SPUR      left little toe    . EXTRACTION, CATARACT, PHACO, IOL Left 08/07/2016    Procedure: EXTRACTION, CATARACT, PHACO, IOL;  Surgeon: Su Grand, MD;  Location: [facility];  Service: Ophthalmology;  Laterality: Left;     Family History   Problem Relation Age of Onset   . Cancer Other         BREAST CANCER.   . Colon polyps Other    . Anesthesia problems Other    . Heart disease Mother    .  Heart attack Father    . Asthma Brother    . Heart disease Maternal Grandfather    . Heart disease Paternal Grandfather    . Cancer Sister    . Arthritis Sister    . COPD Sister    . Diabetes Sister    . Vasculitis Brother      Social History     Social History   . Marital status: Married     Spouse name: N/A   . Number of children: N/A   . Years of education: N/A     Occupational History   . Not on file.     Social History Main Topics   . Smoking status: Former Smoker     Quit date: 07/22/1982   . Smokeless tobacco: Never Used   . Alcohol use Yes      Comment: wine 3/week   . Drug use: No   . Sexual activity: Not on file     Other Topics Concern   . Not on file     Social History Narrative   . No narrative on file     Current Outpatient Prescriptions on File Prior to Visit   Medication Sig Dispense Refill   . amLODIPine-benazepril  (LOTREL 5-20) 5-20 MG per capsule Take 1 capsule by mouth daily. 90 capsule 4   . aspirin 81 MG tablet Take 1 tablet (81 mg total) by mouth daily. 100 tablet 4   . atorvastatin (LIPITOR) 20 MG tablet TAKE 1 TABLET BY MOUTH EVERY DAY 90 tablet 3   . b complex vitamins capsule Take 1 capsule by mouth daily.     . bimatoprost (LUMIGAN) 0.03 % ophthalmic drops Place 1 drop into both eyes nightly.     . brimonidine (ALPHAGAN) 0.2 % ophthalmic solution Place 1 drop into both eyes 2 (two) times daily.     . Cetirizine HCl (ZYRTEC PO) Take 10 mg by mouth as needed.      . Cholecalciferol (VITAMIN D) 1000 UNIT tablet Take 1,000 Units by mouth daily.     Marland Kitchen CO-ENZYME Q-10 PO Take 300 mg by mouth daily.     . dorzolamide-timolol (COSOPT) 22.3-6.8 MG/ML ophthalmic solution Apply 1 drop to eye 2 (two) times daily.     Marland Kitchen FLUoxetine (PROZAC) 10 MG capsule TAKE ONE CAPSULE BY MOUTH EVERY DAY 90 capsule 3   . ipratropium (ATROVENT) 0.06 % nasal spray 2 sprays by Nasal route 3 (three) times daily as needed for Rhinitis. 15 mL 5   . latanoprost (XALATAN) 0.005 % ophthalmic solution      . Mometasone Furoate (NASONEX NA) 50 mcg by Nasal route as needed.     . Multiple Vitamin (MULTIVITAMIN) tablet Take 1 tablet by mouth daily.     . nabumetone (RELAFEN) 500 MG tablet Take 2 tablets (1,000 mg total) by mouth daily. 60 tablet 3   . Omega-3 Fatty Acids (FISH OIL BURP-LESS) 1000 MG Cap Take 1 capsule by mouth daily.     Marland Kitchen omeprazole (PRILOSEC) 20 MG capsule TAKE ONE CAPSULE BY MOUTH ONCE DAILY 90 capsule 0   . amitriptyline (ELAVIL) 10 MG tablet Take 1 tablet (10 mg total) by mouth nightly as needed. 90 tablet 2   . [DISCONTINUED] HYDROcodone-acetaminophen (NORCO) 5-325 MG per tablet Take 1 tablet by mouth every 6 (six) hours as needed for Pain.       No current facility-administered medications on file prior to visit.  The following portions of the patient's history were reviewed and updated as appropriate: allergies, current  medications, past family history, past medical history, past social history, past surgical history and problem list.    Review of Systems   All other systems reviewed and are negative.          Objective:    Physical Exam   Constitutional: She is oriented to person, place, and time. She appears well-developed. No distress.   HENT:   Head: Normocephalic and atraumatic.   Right Ear: External ear normal.   Left Ear: External ear normal.   Nose: Nose normal.   Mouth/Throat: Oropharynx is clear and moist. No oropharyngeal exudate.   Eyes: Pupils are equal, round, and reactive to light. Conjunctivae and EOM are normal. Right eye exhibits no discharge. Left eye exhibits no discharge. No scleral icterus.   Neck: Normal range of motion. Neck supple. No JVD present. No tracheal deviation present. No thyromegaly present.   Cardiovascular: Normal rate, regular rhythm, normal heart sounds and intact distal pulses.  Exam reveals no gallop and no friction rub.    No murmur heard.  Pulmonary/Chest: Effort normal and breath sounds normal. No stridor. No respiratory distress. She has no wheezes. She has no rales. She exhibits no tenderness.   Abdominal: Soft. Bowel sounds are normal. She exhibits no distension and no mass. There is no tenderness. There is no rebound and no guarding.   Musculoskeletal: Normal range of motion. She exhibits no edema or tenderness.   Lymphadenopathy:     She has no cervical adenopathy.   Neurological: She is alert and oriented to person, place, and time. She has normal reflexes. No cranial nerve deficit. Coordination normal.   Skin: Skin is warm and dry. No rash noted. She is not diaphoretic. No erythema. No pallor.   Psychiatric: She has a normal mood and affect. Her behavior is normal. Judgment and thought content normal.   Nursing note and vitals reviewed.          Assessment:       1. Essential hypertension, benign    2. Dyslipidemia    3. Pre-diabetes    4. Restless legs syndrome (RLS)    5.  Gastroesophageal reflux disease, esophagitis presence not specified    6. Graves disease    7. Acute left-sided low back pain without sciatica          Plan:       1.The patient was recommended a whole food no added sugar low sodium diet along with an exercise program 4-5 days a week for 45 minutes daily to promote weight loss. They were also advised to eat 3-4 small meals a day as well as not eating late at night.  2.We discussed the seborrheic keratoses as well. I offered to perform liquid nitrogen cryotherapy but she declined.  3. We discussed her high level of skin tags. I did relate that this is seen more often and people that are overweight with insulin resistance her high circulating levels of insulin. Just another reason for her to try and lose weight.  4. In regards to her left low back pain I feel is musculoskeletal. The urine will be checked today and I have offered formal physical therapy but she declines. She can also perform some home physical therapy exercises and stretches which we discussed today and restart her Relafen 500 mg twice daily with food if needed. Heating pad is also recommended to be used intermittently. Avoidance of excessive  bending or lifting or twisting is also recommended.  5. Labs today include CBC, CMP, lipids, thyroid function, hemoglobin A1c and urinalysis.  6. Patient may substitute Tylenol PM for amitriptyline but she does realize that the amitriptyline was added for headache prevention.  7. Follow-up 3 months

## 2017-02-18 LAB — HEMOGLOBIN A1C: Hgb A1C, %: 5.4 %

## 2017-03-21 ENCOUNTER — Other Ambulatory Visit (RURAL_HEALTH_CENTER): Payer: Self-pay | Admitting: Family Medicine

## 2017-05-10 ENCOUNTER — Other Ambulatory Visit (RURAL_HEALTH_CENTER): Payer: Self-pay | Admitting: Family Medicine

## 2017-05-10 DIAGNOSIS — I10 Essential (primary) hypertension: Secondary | ICD-10-CM

## 2017-05-23 ENCOUNTER — Ambulatory Visit
Admission: RE | Admit: 2017-05-23 | Discharge: 2017-05-23 | Disposition: A | Discharge status: Home / Self Care | Payer: No Typology Code available for payment source | Source: Ambulatory Visit | Attending: Family Medicine | Admitting: Family Medicine

## 2017-05-23 ENCOUNTER — Other Ambulatory Visit (RURAL_HEALTH_CENTER): Payer: Self-pay | Admitting: Family Medicine

## 2017-05-23 DIAGNOSIS — Z1231 Encounter for screening mammogram for malignant neoplasm of breast: Secondary | ICD-10-CM | POA: Insufficient documentation

## 2017-05-23 DIAGNOSIS — Z1239 Encounter for other screening for malignant neoplasm of breast: Secondary | ICD-10-CM

## 2017-05-26 ENCOUNTER — Ambulatory Visit: Payer: No Typology Code available for payment source | Attending: Family Medicine | Admitting: Family Medicine

## 2017-05-26 ENCOUNTER — Other Ambulatory Visit
Admission: RE | Admit: 2017-05-26 | Discharge: 2017-05-26 | Disposition: A | Discharge status: Home / Self Care | Payer: No Typology Code available for payment source | Source: Ambulatory Visit | Attending: Family Medicine | Admitting: Family Medicine

## 2017-05-26 ENCOUNTER — Encounter (RURAL_HEALTH_CENTER): Payer: Self-pay | Admitting: Family Medicine

## 2017-05-26 VITALS — BP 125/72 | HR 63 | Temp 98.6°F | Resp 18 | Ht 66.85 in | Wt 209.6 lb

## 2017-05-26 DIAGNOSIS — G2581 Restless legs syndrome: Secondary | ICD-10-CM

## 2017-05-26 DIAGNOSIS — I10 Essential (primary) hypertension: Secondary | ICD-10-CM

## 2017-05-26 DIAGNOSIS — M8580 Other specified disorders of bone density and structure, unspecified site: Secondary | ICD-10-CM

## 2017-05-26 DIAGNOSIS — E785 Hyperlipidemia, unspecified: Secondary | ICD-10-CM

## 2017-05-26 DIAGNOSIS — R7303 Prediabetes: Secondary | ICD-10-CM

## 2017-05-26 DIAGNOSIS — E0500 Thyrotoxicosis with diffuse goiter without thyrotoxic crisis or storm: Secondary | ICD-10-CM

## 2017-05-26 LAB — CBC AND DIFFERENTIAL
Bands: 1 % (ref 0–10)
Basophils Absolute: 0 10*3/uL (ref 0.0–0.3)
Eosinophils %: 3 % (ref 0.0–7.0)
Eosinophils Absolute: 0.2 10*3/uL (ref 0.0–0.8)
Hematocrit: 43.5 % (ref 36.0–48.0)
Hemoglobin: 14 gm/dL (ref 12.0–16.0)
Lymphocytes Absolute: 1.3 10*3/uL (ref 0.6–5.1)
Lymphocytes: 26 % (ref 15.0–46.0)
MCH: 31 pg (ref 28–35)
MCHC: 32 gm/dL (ref 32–36)
MCV: 96 fL (ref 80–100)
MPV: 9.7 fL (ref 6.0–10.0)
Monocytes Absolute: 0.2 10*3/uL (ref 0.1–1.7)
Monocytes: 3 % (ref 3.0–15.0)
Neutrophils %: 67 % (ref 42.0–78.0)
Neutrophils Absolute: 3.4 10*3/uL (ref 1.7–8.6)
PLT CT: 267 10*3/uL (ref 130–440)
RBC: 4.55 10*6/uL (ref 3.80–5.00)
RDW: 11.3 % (ref 11.0–14.0)
WBC: 5 10*3/uL (ref 4.0–11.0)

## 2017-05-26 LAB — T3, FREE: T3, Free: 2.2 pg/mL (ref 1.71–3.71)

## 2017-05-26 LAB — COMPREHENSIVE METABOLIC PANEL
ALT: 17 U/L (ref 0–55)
AST (SGOT): 26 U/L (ref 10–42)
Albumin/Globulin Ratio: 1.27 Ratio (ref 0.70–1.50)
Albumin: 4.2 gm/dL (ref 3.5–5.0)
Alkaline Phosphatase: 78 U/L (ref 40–145)
Anion Gap: 10.6 mMol/L (ref 7.0–18.0)
BUN / Creatinine Ratio: 10.7 Ratio (ref 10.0–30.0)
BUN: 8 mg/dL (ref 7–22)
Bilirubin, Total: 0.7 mg/dL (ref 0.1–1.2)
CO2: 26.7 mMol/L (ref 20.0–30.0)
Calcium: 10 mg/dL (ref 8.5–10.5)
Chloride: 104 mMol/L (ref 98–110)
Creatinine: 0.75 mg/dL (ref 0.60–1.20)
EGFR: 80 mL/min/{1.73_m2} (ref 60–150)
Globulin: 3.3 gm/dL (ref 2.0–4.0)
Glucose: 109 mg/dL — ABNORMAL HIGH (ref 71–99)
Osmolality Calc: 273 mOsm/kg — ABNORMAL LOW (ref 275–300)
Potassium: 4.3 mMol/L (ref 3.5–5.3)
Protein, Total: 7.5 gm/dL (ref 6.0–8.3)
Sodium: 137 mMol/L (ref 136–147)

## 2017-05-26 LAB — LIPID PANEL
Cholesterol: 170 mg/dL (ref 75–199)
Coronary Heart Disease Risk: 2.7
HDL: 63 mg/dL (ref 45–65)
LDL Calculated: 86 mg/dL
Triglycerides: 106 mg/dL (ref 10–150)
VLDL: 21 (ref 0–40)

## 2017-05-26 LAB — HEMOGLOBIN A1C: Hgb A1C, %: 5.4 %

## 2017-05-26 LAB — TSH: TSH: 1.44 u[IU]/mL (ref 0.40–4.20)

## 2017-05-26 LAB — T4, FREE: T4 Free: 1.08 ng/dL (ref 0.70–1.48)

## 2017-05-26 MED ORDER — AMITRIPTYLINE HCL 10 MG PO TABS
10.0000 mg | ORAL_TABLET | Freq: Every evening | ORAL | 2 refills | Status: DC | PRN
Start: 2017-05-26 — End: 2018-03-15

## 2017-05-26 NOTE — Progress Notes (Signed)
[facility]  [facility] [place]    History of Presenting Illness:     Chief Complaint   Patient presents with   . Follow-up     3 month       Brittany Berry is a 71 y.o. female who presents to the office for medical management.She seems to be doing well. Her medical problem list is noted in her past medical history.  Blood pressure seems well controlled today. She does have some arthritis symptoms but nothing excessive. Occasionally she'll have GERD and takes a proton pump inhibitor about 3 days a week to try to prevent these symptoms.  She denies any chest pain shortness of breath GI or GU symptoms. She continues to be frustrated with her weight and would like to lose some weight.  Her major complaint today is her left eye has been tearing for several weeks. There is no pain photophobia changes in visual acuity or pustular drainage.  In the past she's been concerned with her memory and this seems to have stabilized and she tells me she is doing well.  She does have a history of Graves' disease but is currently on no medications.  She seems to be sleeping well but does request a refill of amitriptyline 10 mg. She brings in an old bottle that she just ran out of the medication that was filled in 2016. She tells me she only takes this medication when she "needs it".  She's had her flu shot this year.    Review of Systems:   ROS  In addition to those noted in history of present illness all other review of systems performed and were negative.    Past Medical History:     Past Medical History:   Diagnosis Date   . Arthritis    . Chronic headache 03/30/2013   . Chronic right-sided low back pain without sciatica 02/12/2016   . Cyst of breast    . Depression    . Diabetes mellitus     [facility]- NO MEDS REQUIRED AT THIS TIME.   Marland Kitchen Dyslipidemia 03/30/2013   . Essential hypertension, benign    . GERD (gastroesophageal reflux disease) 03/30/2013   . Graves disease  03/30/2013   . High cholesterol    . Mild cognitive impairment with memory loss 10/30/2016   . Mixed hyperlipidemia    . Osteoarthritis 03/30/2013   . Osteopenia 03/30/2013   . Pre-diabetes 02/23/2014   . Restless legs syndrome (RLS)    . Tendonitis    . 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 Unspecified glaucoma(365.9)        Past Surgical History:     Past Surgical History:   Procedure Laterality Date   . BREAST CYST ASPIRATION     . CATARACT EXTRACTION W/ INTRAOCULAR LENS IMPLANT  08/2013    right eye   . COLONOSCOPY  02/11/2013    Procedure: COLONOSCOPY;  Surgeon: Dineen Kid, MD;  [facility];  Service: Gastroenterology;  Laterality: N/A;   . COLONOSCOPY  08/14    - colonoscopy   . DILATION AND CURETTAGE OF UTERUS     . EXCISION, BONE SPUR      left little toe    . EXTRACTION, CATARACT, PHACO, IOL Left 08/07/2016    Procedure: EXTRACTION, CATARACT, PHACO, IOL;  Surgeon: Su Grand, MD;  [facility]: [facility];  Service: Ophthalmology;  Laterality: Left;       Family History:     Family History   Problem Relation Age of Onset   . Breast cancer Sister    . Cancer Other         BREAST CANCER.   . Colon polyps Other    . Anesthesia problems Other    . Heart disease Mother    . Heart attack Father    . Asthma Brother    . Heart disease Maternal Grandfather    . Heart disease Paternal Grandfather    . Cancer Sister    . Arthritis Sister    . COPD Sister    . Diabetes Sister    . Vasculitis Brother        Social History:     History   Smoking Status   . Former Smoker   . Quit date: 07/22/1982   Smokeless Tobacco   . Never Used     History   Alcohol Use   . Yes     Comment: wine 3/week     History   Drug Use No       Allergies:     Allergies   Allergen Reactions   . Codeine Nausea Only   . Prednisone      Causes depression       Medications:     Prior to Admission medications    Medication Sig Start Date End Date Taking? Authorizing Provider   amitriptyline (ELAVIL) 10  MG tablet Take 1 tablet (10 mg total) by mouth nightly as needed. 11/07/14  Yes Marchia Meiers, MD   amLODIPine-benazepril (LOTREL 5-20) 5-20 MG per capsule TAKE ONE CAPSULE BY MOUTH EVERY DAY 05/12/17  Yes Marchia Meiers, MD   aspirin 81 MG tablet Take 1 tablet (81 mg total) by mouth daily. 02/12/16  Yes Marchia Meiers, MD   atorvastatin (LIPITOR) 20 MG tablet TAKE 1 TABLET BY MOUTH EVERY DAY 07/30/16  Yes Marchia Meiers, MD   b complex vitamins capsule Take 1 capsule by mouth daily.   Yes [provider]   bimatoprost (LUMIGAN) 0.03 % ophthalmic drops Place 1 drop into both eyes nightly.   Yes [provider]   brimonidine (ALPHAGAN) 0.2 % ophthalmic solution Place 1 drop into both eyes 2 (two) times daily.   Yes [provider]   Cetirizine HCl (ZYRTEC PO) Take 10 mg by mouth as needed.    Yes [provider]   Cholecalciferol (VITAMIN D) 1000 UNIT tablet Take 1,000 Units by mouth daily.   Yes [provider]   CO-ENZYME Q-10 PO Take 300 mg by mouth daily.   Yes [provider]   dorzolamide-timolol (COSOPT) 22.3-6.8 MG/ML ophthalmic solution Apply 1 drop to eye 2 (two) times daily.   Yes [provider]   FLUoxetine (PROZAC) 10 MG capsule TAKE ONE CAPSULE BY MOUTH EVERY DAY 03/21/17  Yes Marchia Meiers, MD   ipratropium (ATROVENT) 0.06 % nasal spray 2 sprays by Nasal route 3 (three) times daily as needed for Rhinitis. 11/07/14  Yes Marchia Meiers, MD   latanoprost Harrel Lemon) 0.005 % ophthalmic solution  02/18/14  Yes [provider]   Mometasone Furoate (NASONEX NA) 50 mcg by Nasal route as needed.   Yes [provider]   Multiple Vitamin (MULTIVITAMIN) tablet Take 1 tablet by mouth daily.   Yes [provider]   nabumetone (RELAFEN) 500 MG tablet Take 2 tablets (1,000 mg  total) by mouth daily. 11/04/16  Yes Marchia Meiers, MD   Omega-3 Fatty Acids (FISH OIL BURP-LESS) 1000 MG Cap Take 1  capsule by mouth daily.   Yes [provider]   omeprazole (PRILOSEC) 20 MG capsule TAKE ONE CAPSULE BY MOUTH ONCE DAILY 11/25/16  Yes Marchia Meiers, MD       Physical Exam:     Vitals:    05/26/17 0940   BP: 125/72   Pulse: 63   Resp: 18   Temp: 98.6 F (37 C)   SpO2: 97%     Body mass index is 32.98 kg/m.    Physical Exam   Constitutional: She is oriented to person, place, and time. She appears well-developed. No distress.   HENT:   Head: Normocephalic and atraumatic.   Right Ear: External ear normal.   Left Ear: External ear normal.   Nose: Nose normal.   Mouth/Throat: Oropharynx is clear and moist. No oropharyngeal exudate.   Eyes: Pupils are equal, round, and reactive to light. Conjunctivae and EOM are normal. Right eye exhibits no discharge. Left eye exhibits no discharge. No scleral icterus.   I do not see any obvious visual abnormalities with her left eye and it does not appear to be tearing at this point in time.   Neck: Normal range of motion. Neck supple. No JVD present. No tracheal deviation present. No thyromegaly present.   Cardiovascular: Normal rate, regular rhythm, normal heart sounds and intact distal pulses.  Exam reveals no gallop and no friction rub.    No murmur heard.  Pulmonary/Chest: Effort normal and breath sounds normal. No stridor. No respiratory distress. She has no wheezes. She has no rales. She exhibits no tenderness.   Abdominal: Soft. Bowel sounds are normal. She exhibits no distension and no mass. There is no tenderness. There is no rebound and no guarding.   Musculoskeletal: Normal range of motion. She exhibits no edema or tenderness.   Lymphadenopathy:     She has no cervical adenopathy.   Neurological: She is alert and oriented to person, place, and time. She has normal reflexes. No cranial nerve deficit. Coordination normal.   Skin: Skin is warm and dry. No rash noted. She is not diaphoretic. No erythema. No pallor.   Psychiatric: She has a normal mood and  affect. Her behavior is normal. Judgment and thought content normal.   Nursing note and vitals reviewed.      Assessment:     1. Dyslipidemia  CBC and differential    Comprehensive metabolic panel    Lipid panel    Hemoglobin A1C    T4, free    T3, free    TSH   2. Essential hypertension, benign  CBC and differential    Comprehensive metabolic panel    Lipid panel    Hemoglobin A1C    T4, free    T3, free    TSH   3. Pre-diabetes  CBC and differential    Comprehensive metabolic panel    Lipid panel    Hemoglobin A1C    T4, free    T3, free    TSH   4. Restless legs syndrome (RLS)  CBC and differential    Comprehensive metabolic panel    Lipid panel    Hemoglobin A1C    T4, free    T3, free    TSH   5. Osteopenia, unspecified location  CBC and differential    Comprehensive metabolic panel  Lipid panel    Hemoglobin A1C    T4, free    T3, free    TSH   6. Graves disease  CBC and differential    Comprehensive metabolic panel    Lipid panel    Hemoglobin A1C    T4, free    T3, free    TSH       Plan:   1. We discussed diet and weight management. I like for her to follow up predominantly meat-based diet with addition of some green leafy vegetables, low carbohydrate fruits and nuts. Avocado would be also something that is healthy and good to consume. She is able to cut out gr ingrained-based products and foods and sugar and sweets I think she will lose weight.  2. Continue current medical therapy. I did refill her amitriptyline 10 mg at bedtime if needed for sleep.  3. Fasting labs today include CBC, CMP, lipids, thyroid function and hemoglobin A1c  4. Recommended she see Dr.Reuling her tearing eye.  5. Follow-up 3 months    Signed by Evangeline Gula M.D.    This note was completed using [employer], medical. Grammatical errors, random word insertions, pronoun errors and incomplete sentences are occasional consequences of this technology due to software limitations. If there are questions or  concerns about the content of this note or information contained within the body of this dictation they should be addressed with the provider for clarification.

## 2017-05-27 ENCOUNTER — Telehealth (RURAL_HEALTH_CENTER): Payer: Self-pay

## 2017-05-27 NOTE — Telephone Encounter (Signed)
Results given to patient.//bc

## 2017-05-27 NOTE — Telephone Encounter (Signed)
-----   Message from Marchia Meiers, MD sent at 05/27/2017  2:55 PM EST -----  Mammogram is negative.

## 2017-08-19 ENCOUNTER — Other Ambulatory Visit (RURAL_HEALTH_CENTER): Payer: Self-pay

## 2017-08-19 MED ORDER — ATORVASTATIN CALCIUM 20 MG PO TABS
20.0000 mg | ORAL_TABLET | Freq: Every day | ORAL | 3 refills | Status: DC
Start: 2017-08-19 — End: 2018-08-08

## 2017-08-28 ENCOUNTER — Ambulatory Visit (RURAL_HEALTH_CENTER): Payer: No Typology Code available for payment source | Admitting: Family Medicine

## 2017-08-28 DIAGNOSIS — L821 Other seborrheic keratosis: Secondary | ICD-10-CM | POA: Diagnosis not present

## 2017-08-28 DIAGNOSIS — Z85828 Personal history of other malignant neoplasm of skin: Secondary | ICD-10-CM | POA: Diagnosis not present

## 2017-08-28 DIAGNOSIS — L918 Other hypertrophic disorders of the skin: Secondary | ICD-10-CM | POA: Diagnosis not present

## 2017-08-28 DIAGNOSIS — Z08 Encounter for follow-up examination after completed treatment for malignant neoplasm: Secondary | ICD-10-CM | POA: Diagnosis not present

## 2017-10-03 DIAGNOSIS — J069 Acute upper respiratory infection, unspecified: Secondary | ICD-10-CM | POA: Diagnosis not present

## 2017-10-13 ENCOUNTER — Encounter (RURAL_HEALTH_CENTER): Payer: Self-pay | Admitting: Family Medicine

## 2017-10-13 ENCOUNTER — Ambulatory Visit: Payer: Medicare Other | Attending: Family Medicine | Admitting: Family Medicine

## 2017-10-13 ENCOUNTER — Other Ambulatory Visit
Admission: RE | Admit: 2017-10-13 | Discharge: 2017-10-13 | Disposition: A | Discharge status: Home / Self Care | Payer: Medicare Other | Source: Ambulatory Visit | Attending: Family Medicine | Admitting: Family Medicine

## 2017-10-13 VITALS — BP 113/74 | HR 74 | Temp 98.4°F | Resp 16 | Wt 207.0 lb

## 2017-10-13 DIAGNOSIS — E785 Hyperlipidemia, unspecified: Secondary | ICD-10-CM

## 2017-10-13 DIAGNOSIS — M7701 Medial epicondylitis, right elbow: Secondary | ICD-10-CM

## 2017-10-13 DIAGNOSIS — R7303 Prediabetes: Secondary | ICD-10-CM

## 2017-10-13 DIAGNOSIS — K529 Noninfective gastroenteritis and colitis, unspecified: Secondary | ICD-10-CM

## 2017-10-13 DIAGNOSIS — I10 Essential (primary) hypertension: Secondary | ICD-10-CM

## 2017-10-13 DIAGNOSIS — G2581 Restless legs syndrome: Secondary | ICD-10-CM

## 2017-10-13 DIAGNOSIS — K52839 Microscopic colitis, unspecified: Secondary | ICD-10-CM

## 2017-10-13 LAB — CBC AND DIFFERENTIAL
Basophils %: 1.1 % (ref 0.0–3.0)
Basophils Absolute: 0.1 10*3/uL (ref 0.0–0.3)
Eosinophils %: 3.4 % (ref 0.0–7.0)
Eosinophils Absolute: 0.2 10*3/uL (ref 0.0–0.8)
Hematocrit: 41.9 % (ref 36.0–48.0)
Hemoglobin: 14 gm/dL (ref 12.0–16.0)
Lymphocytes Absolute: 2.1 10*3/uL (ref 0.6–5.1)
Lymphocytes: 34.8 % (ref 15.0–46.0)
MCH: 32 pg (ref 28–35)
MCHC: 33 gm/dL (ref 32–36)
MCV: 95 fL (ref 80–100)
MPV: 9.1 fL (ref 6.0–10.0)
Monocytes Absolute: 0.5 10*3/uL (ref 0.1–1.7)
Monocytes: 8.2 % (ref 3.0–15.0)
Neutrophils %: 52.5 % (ref 42.0–78.0)
Neutrophils Absolute: 3.2 10*3/uL (ref 1.7–8.6)
PLT CT: 335 10*3/uL (ref 130–440)
RBC: 4.43 10*6/uL (ref 3.80–5.00)
RDW: 11.2 % (ref 11.0–14.0)
WBC: 6.1 10*3/uL (ref 4.0–11.0)

## 2017-10-13 LAB — COMPREHENSIVE METABOLIC PANEL
ALT: 23 U/L (ref 0–55)
AST (SGOT): 26 U/L (ref 10–42)
Albumin/Globulin Ratio: 1.3 Ratio (ref 0.70–1.50)
Albumin: 4.3 gm/dL (ref 3.5–5.0)
Alkaline Phosphatase: 69 U/L (ref 40–145)
Anion Gap: 12.5 mMol/L (ref 7.0–18.0)
BUN / Creatinine Ratio: 15.8 Ratio (ref 10.0–30.0)
BUN: 12 mg/dL (ref 7–22)
Bilirubin, Total: 0.7 mg/dL (ref 0.1–1.2)
CO2: 25.7 mMol/L (ref 20.0–30.0)
Calcium: 10.3 mg/dL (ref 8.5–10.5)
Chloride: 103 mMol/L (ref 98–110)
Creatinine: 0.76 mg/dL (ref 0.60–1.20)
EGFR: 79 mL/min/{1.73_m2} (ref 60–150)
Globulin: 3.3 gm/dL (ref 2.0–4.0)
Glucose: 107 mg/dL — ABNORMAL HIGH (ref 71–99)
Osmolality Calc: 274 mOsm/kg — ABNORMAL LOW (ref 275–300)
Potassium: 4.2 mMol/L (ref 3.5–5.3)
Protein, Total: 7.6 gm/dL (ref 6.0–8.3)
Sodium: 137 mMol/L (ref 136–147)

## 2017-10-13 LAB — T4, FREE: T4 Free: 0.96 ng/dL (ref 0.70–1.48)

## 2017-10-13 LAB — TSH: TSH: 1.43 u[IU]/mL (ref 0.40–4.20)

## 2017-10-13 LAB — LIPID PANEL
Cholesterol: 197 mg/dL (ref 75–199)
Coronary Heart Disease Risk: 3.4
HDL: 58 mg/dL (ref 45–65)
LDL Calculated: 109 mg/dL
Triglycerides: 150 mg/dL (ref 10–150)
VLDL: 30 (ref 0–40)

## 2017-10-13 LAB — T3, FREE: T3, Free: 2.8 pg/mL (ref 1.71–3.71)

## 2017-10-13 LAB — HEPATITIS C ANTIBODY: Hepatitis C Antibody: NONREACTIVE

## 2017-10-13 LAB — HEMOGLOBIN A1C: Hgb A1C, %: 5.4 %

## 2017-10-13 MED ORDER — LIDOCAINE HCL (PF) 1 % IJ SOLN
2.0000 mL | Freq: Once | INTRAMUSCULAR | Status: AC
Start: 2017-10-13 — End: 2017-10-13
  Administered 2017-10-13: 12:00:00 2 mL

## 2017-10-13 MED ORDER — METHYLPREDNISOLONE ACETATE 80 MG/ML IJ SUSP
80.0000 mg | Freq: Once | INTRAMUSCULAR | Status: AC
Start: 2017-10-13 — End: 2017-10-13
  Administered 2017-10-13: 12:00:00 80 mg

## 2017-10-13 NOTE — Progress Notes (Signed)
[facility]  [facility] [place]    History of Presenting Illness:     Chief Complaint   Patient presents with   . Follow-up     3 mo f/u   . Health Maintenance     Due hep c screening       Brittany Berry is a 72 y.o. female who presents to the office for medical management.  Patient Active Problem List   Diagnosis   . Unspecified glaucoma(365.9)   . Essential hypertension, benign   . Restless legs syndrome (RLS)   . Impaired fasting glucose   . Dyslipidemia   . Graves disease   . Osteoarthritis   . GERD (gastroesophageal reflux disease)   . Osteopenia   . Pre-diabetes   . Nondisp fx of proximal phalanx of left little finger with routine heal   . Chronic right-sided low back pain without sciatica   . Mild cognitive impairment with memory loss     She seems to be doing fairly well although she's developed some right elbow  pain and discomfort since she was last here. There's been no injury. Housework does seem to aggravate this condition. The discomfort is over the medial epicondyle of her right elbow.  She would like to be screened for hepatitis C. She has no identifiable risk factors.  She would also like to be referred for screening colonoscopy. 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 She has developed some intermittent diarrhea for the past 3 months. She is not sure exactly which foods seem to aggravate it. No fever chills or blood in her stool.  She continues to struggle with her weight. She is not really following any specific diet.  Review of Systems:   ROS  In addition to those noted in history of present illness all other review of systems performed and were negative.    Past Medical History:     Past Medical History:   Diagnosis Date   . Arthritis    . Chronic headache 03/30/2013   . Chronic right-sided low back pain without sciatica 02/12/2016   . Cyst of breast    . Depression    . Diabetes mellitus     [facility]- NO MEDS REQUIRED AT THIS TIME.   Marland Kitchen Dyslipidemia 03/30/2013   . Essential hypertension, benign    . GERD (gastroesophageal reflux disease) 03/30/2013   . Graves disease 03/30/2013   . High cholesterol    . Mild cognitive impairment with memory loss 10/30/2016   . Mixed hyperlipidemia    . Osteoarthritis 03/30/2013   . Osteopenia 03/30/2013   . Pre-diabetes 02/23/2014   . Restless legs syndrome (RLS)    . Tendonitis    . 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 Unspecified glaucoma(365.9)        Past Surgical History:     Past Surgical History:   Procedure Laterality Date   . BREAST CYST ASPIRATION     . CATARACT EXTRACTION W/ INTRAOCULAR LENS IMPLANT  08/2013    right eye   . COLONOSCOPY  02/11/2013    Procedure: COLONOSCOPY;  Surgeon: Dineen Kid, MD;  [facility]: [facility];  Service: Gastroenterology;  Laterality: N/A;   . COLONOSCOPY  08/14    - colonoscopy   . DILATION AND CURETTAGE OF UTERUS     . EXCISION, BONE SPUR  left little toe    . EXTRACTION, CATARACT, PHACO, IOL Left 08/07/2016    Procedure: EXTRACTION, CATARACT, PHACO, IOL;  Surgeon: Su Grand, MD;  [facility];  Service: Ophthalmology;  Laterality: Left;       Family History:     Family History   Problem Relation Age of Onset   . Breast cancer Sister    . Cancer Other         BREAST CANCER.   . Colon polyps Other    . Anesthesia problems Other    . Heart disease Mother    . Heart attack Father    . Asthma Brother    . Heart disease Maternal Grandfather    . Heart disease Paternal Grandfather    . Cancer Sister    . Arthritis Sister    . COPD Sister    . Diabetes Sister    . Vasculitis Brother        Social History:     History   Smoking Status   . Former Smoker   . Quit date: 07/22/1982   Smokeless Tobacco   . Never Used     History   Alcohol Use   . Yes     Comment: wine 3/week     History   Drug Use No       Allergies:     Allergies   Allergen Reactions   . Codeine Nausea Only   . Prednisone       Causes depression       Medications:     Prior to Admission medications    Medication Sig Start Date End Date Taking? Authorizing Provider   amitriptyline (ELAVIL) 10 MG tablet Take 1 tablet (10 mg total) by mouth nightly as needed for Sleep. 05/26/17  Yes Marchia Meiers, MD   amLODIPine-benazepril (LOTREL 5-20) 5-20 MG per capsule TAKE ONE CAPSULE BY MOUTH EVERY DAY 05/12/17  Yes Marchia Meiers, MD   aspirin 81 MG tablet Take 1 tablet (81 mg total) by mouth daily. 02/12/16  Yes Marchia Meiers, MD   atorvastatin (LIPITOR) 20 MG tablet Take 1 tablet (20 mg total) by mouth daily. 08/19/17  Yes Marchia Meiers, MD   b complex vitamins capsule Take 1 capsule by mouth daily.   Yes [provider]   bimatoprost (LUMIGAN) 0.03 % ophthalmic drops Place 1 drop into both eyes nightly.   Yes [provider]   brimonidine (ALPHAGAN) 0.2 % ophthalmic solution Place 1 drop into both eyes 2 (two) times daily.   Yes [provider]   Cetirizine HCl (ZYRTEC PO) Take 10 mg by mouth as needed.    Yes [provider]   Cholecalciferol (VITAMIN D) 1000 UNIT tablet Take 1,000 Units by mouth daily.   Yes [provider]   CO-ENZYME Q-10 PO Take 300 mg by mouth daily.   Yes [provider]   dorzolamide-timolol (COSOPT) 22.3-6.8 MG/ML ophthalmic solution Apply 1 drop to eye 2 (two) times daily.   Yes [provider]   FLUoxetine (PROZAC) 10 MG capsule TAKE ONE CAPSULE BY MOUTH EVERY DAY 03/21/17  Yes Marchia Meiers, MD   latanoprost (XALATAN) 0.005 % ophthalmic solution  02/18/14  Yes [provider]   Multiple Vitamin (MULTIVITAMIN) tablet Take 1 tablet by mouth daily.   Yes [provider]   nabumetone (RELAFEN) 500 MG tablet Take 2 tablets (1,000 mg total) by mouth daily. 11/04/16  Yes  Marchia Meiers, MD   Olopatadine HCl (PAZEO) 0.7 % Solution Apply to eye.   Yes [provider]   Omega-3 Fatty Acids (FISH OIL  BURP-LESS) 1000 MG Cap Take 1 capsule by mouth daily.   Yes [provider]   omeprazole (PRILOSEC) 20 MG capsule TAKE ONE CAPSULE BY MOUTH ONCE DAILY 11/25/16  Yes Marchia Meiers, MD   ipratropium (ATROVENT) 0.06 % nasal spray 2 sprays by Nasal route 3 (three) times daily as needed for Rhinitis. 11/07/14   Marchia Meiers, MD   Mometasone Furoate (NASONEX NA) 50 mcg by Nasal route as needed.    [provider]       Physical Exam:     Vitals:    10/13/17 1111   BP: 113/74   Pulse: 74   Resp: 16   Temp: 98.4 F (36.9 C)   SpO2: 98%     Body mass index is 32.57 kg/m.    Physical Exam   Constitutional: She is oriented to person, place, and time. She appears well-developed. No distress.   Pleasant obese white female   HENT:   Head: Normocephalic and atraumatic.   Right Ear: External ear normal.   Left Ear: External ear normal.   Nose: Nose normal.   Mouth/Throat: Oropharynx is clear and moist. No oropharyngeal exudate.   Eyes: Pupils are equal, round, and reactive to light. Conjunctivae and EOM are normal. Right eye exhibits no discharge. Left eye exhibits no discharge. No scleral icterus.   Neck: Normal range of motion. Neck supple. No JVD present. No tracheal deviation present. No thyromegaly present.   Cardiovascular: Normal rate, regular rhythm, normal heart sounds and intact distal pulses.  Exam reveals no gallop and no friction rub.    No murmur heard.  Pulmonary/Chest: Effort normal and breath sounds normal. No stridor. No respiratory distress. She has no wheezes. She has no rales. She exhibits no tenderness.   Abdominal: Soft. Bowel sounds are normal. She exhibits no distension and no mass. There is no tenderness. There is no rebound and no guarding.   Musculoskeletal: Normal range of motion. She exhibits edema and tenderness.   She does have point tenderness and swelling over the medial epicondyle of her right elbow.  Procedure note: After informed consent skin was prepped with  Betadine. 80 mg of Depo-Medrol and 1 mL of 1% Xylocaine was injected in that elbow over the medial epicondyle. She tolerated the procedure well. Aftercare instructions were discussed.   Lymphadenopathy:     She has no cervical adenopathy.   Neurological: She is alert and oriented to person, place, and time. She has normal reflexes. Coordination normal.   Skin: Skin is warm and dry. No rash noted. She is not diaphoretic. No erythema. No pallor.   Psychiatric: She has a normal mood and affect. Her behavior is normal. Judgment and thought content normal.   Nursing note and vitals reviewed.      Assessment:     1. Essential hypertension, benign  CBC and differential    Comprehensive metabolic panel    Hemoglobin A1C    Lipid panel    T3, free    T4, free    TSH   2. Pre-diabetes  CBC and differential    Comprehensive metabolic panel    Hemoglobin A1C    Lipid panel    T3, free    T4, free    TSH   3. Restless legs syndrome (RLS)  CBC and differential  Comprehensive metabolic panel    Hemoglobin A1C    Lipid panel    T3, free    T4, free    TSH   4. Dyslipidemia  CBC and differential    Comprehensive metabolic panel    Hemoglobin A1C    Lipid panel    T3, free    T4, free    TSH   5. Medial epicondylitis of elbow, right  CBC and differential    Comprehensive metabolic panel    Hemoglobin A1C    Lipid panel    T3, free    T4, free    TSH    lidocaine (PF) (XYLOCAINE) 1 % injection 2 mL    methylPREDNISolone acetate (DEPO-MEDROL) injection 80 mg   6. Chronic diarrhea  Ambulatory referral to Gastroenterology   7. Microscopic colitis, unspecified microscopic colitis type  Ambulatory referral to Gastroenterology       Plan:   1. Consult gastroenterology regarding her chronic abdominal pain and history of microscopic colitis.  2. A whole food all-natural high-protein low-carb diet plan is recommended.  3. May use Imodium or Kaopectate as needed for symptomatic relief of diarrhea.  4. Labs today include CBC, CMP, lipids,  thyroid function, hemoglobin A1c and hepatitis C level.  5. Recommended alternating heat and ice therapy to the right elbow. She does have Relafen 500 mg. She can restart that medication as well.  6. Follow-up 3 months    Signed by Evangeline Gula M.D.    This note was completed using Physicist, medical. Grammatical errors, random word insertions, pronoun errors and incomplete sentences are occasional consequences of this technology due to software limitations. If there are questions or concerns about the content of this note or information contained within the body of this dictation they should be addressed with the provider for clarification.

## 2017-10-20 DIAGNOSIS — Z86718 Personal history of other venous thrombosis and embolism: Secondary | ICD-10-CM

## 2017-10-20 HISTORY — DX: Personal history of other venous thrombosis and embolism: Z86.718

## 2017-11-09 ENCOUNTER — Emergency Department (HOSPITAL_COMMUNITY): Payer: Medicare Other

## 2017-11-09 ENCOUNTER — Emergency Department (HOSPITAL_COMMUNITY)
Admission: EM | Admit: 2017-11-09 | Discharge: 2017-11-09 | Disposition: A | Discharge status: Home / Self Care | Payer: Medicare Other | Attending: Emergency Medicine | Admitting: Emergency Medicine

## 2017-11-09 ENCOUNTER — Encounter (HOSPITAL_COMMUNITY): Payer: Self-pay | Admitting: *Deleted

## 2017-11-09 ENCOUNTER — Other Ambulatory Visit: Payer: Self-pay

## 2017-11-09 DIAGNOSIS — I10 Essential (primary) hypertension: Secondary | ICD-10-CM | POA: Diagnosis not present

## 2017-11-09 DIAGNOSIS — M25469 Effusion, unspecified knee: Secondary | ICD-10-CM

## 2017-11-09 DIAGNOSIS — M25562 Pain in left knee: Secondary | ICD-10-CM | POA: Insufficient documentation

## 2017-11-09 DIAGNOSIS — M25462 Effusion, left knee: Secondary | ICD-10-CM | POA: Insufficient documentation

## 2017-11-09 DIAGNOSIS — M25572 Pain in left ankle and joints of left foot: Secondary | ICD-10-CM | POA: Insufficient documentation

## 2017-11-09 DIAGNOSIS — R252 Cramp and spasm: Secondary | ICD-10-CM | POA: Insufficient documentation

## 2017-11-09 HISTORY — DX: Essential (primary) hypertension: I10

## 2017-11-09 HISTORY — DX: Hyperlipidemia, unspecified: E78.5

## 2017-11-09 LAB — BASIC METABOLIC PANEL WITH GFR
Anion gap: 10 (ref 5–15)
BUN: 17 mg/dL (ref 6–20)
CO2: 25 mmol/L (ref 22–32)
Calcium: 9.8 mg/dL (ref 8.9–10.3)
Chloride: 104 mmol/L (ref 101–111)
Creatinine, Ser: 0.78 mg/dL (ref 0.44–1.00)
GFR calc Af Amer: >60 mL/min
GFR calc non Af Amer: >60 mL/min
Glucose, Bld: 101 mg/dL — ABNORMAL HIGH (ref 65–99)
Potassium: 3.9 mmol/L (ref 3.5–5.1)
Sodium: 139 mmol/L (ref 135–145)

## 2017-11-09 LAB — CBC
HCT: 42.6 % (ref 36.0–46.0)
Hemoglobin: 13.9 g/dL (ref 12.0–15.0)
MCH: 31 pg (ref 26.0–34.0)
MCHC: 32.6 g/dL (ref 30.0–36.0)
MCV: 95.1 fL (ref 78.0–100.0)
Platelets: 289 K/uL (ref 150–400)
RBC: 4.48 MIL/uL (ref 3.87–5.11)
RDW: 13.3 % (ref 11.5–15.5)
WBC: 7.7 K/uL (ref 4.0–10.5)

## 2017-11-09 MED ORDER — DICLOFENAC SODIUM 1 % TD GEL: 2.0000 g | Freq: Four times a day (QID) | TRANSDERMAL | 0 refills | Status: DC

## 2017-11-09 MED ORDER — ACETAMINOPHEN 500 MG PO TABS
1000.0000 mg | ORAL_TABLET | Freq: Once | ORAL | Status: AC
  Administered 2017-11-09: 1000 mg via ORAL
  Filled 2017-11-09: qty 2

## 2017-11-09 MED ORDER — RIVAROXABAN 15 MG PO TABS
15.0000 mg | ORAL_TABLET | Freq: Once | ORAL | Status: AC
  Administered 2017-11-09: 15 mg via ORAL
  Filled 2017-11-09: qty 1

## 2017-11-09 NOTE — Discharge Instructions (Signed)
You will need to report to [facility] at 8 AM tomorrow morning, please go to the registration desk and tell them you are here for a vascular study.  If this study is negative you may begin using Voltaren gel on the knee in addition to Tylenol.  You may use a knee sleeve for comfort.  And crutches or cane to help keep weight off of the knee.  Elevate and use hot and warm compresses.  If your DVT study is negative tomorrow, and your knee pain continues you can follow-up with orthopedics for continued management if the conservative therapies we talked about today are not helping.  If you develop fevers or chills, redness, warmth, worsening swelling or difficulty moving the knee or any other new or concerning symptoms do not hesitate to return to the ED for reevaluation.

## 2017-11-09 NOTE — ED Notes (Signed)
Bed: WA03 Expected date:  Expected time:  Means of arrival:  Comments: 

## 2017-11-09 NOTE — ED Provider Notes (Signed)
[facility] DEPT Provider Note   CSN: [id number] Arrival date & time: 11/09/17  1336     History   Chief Complaint Chief Complaint  Patient presents with  . Knee Pain    Left  . Ankle Pain    Left    HPI Marie Ruiz is a 72 y.o. female.  Marie Ruiz is a 72 y.o. Female with a history of hypertension, hyperlipidemia and glaucoma, who presents to the ED for evaluation of 1 week of left knee pain and ankle pain.  Patient reports over the past week she has been having worsening pain in her left knee, particularly with movement and weightbearing.  Patient denies any inciting injury or fall, she does report she has been moving, and has been going up and down stairs a lot recently and wonders if this is contributing.  Over the last 2 to 3 days she is also developed some left ankle pain although this is left severe.  Patient reports she does feel like the knee is somewhat swollen, and does note some pain to the anterior and posterior aspects of the knee, she denies overlying redness or warmth, she denies any fevers or chills.  She is able to bend and extend the knee with some discomfort.  She denies any numbness or tingling.  Ports some intermittent cramping and pain in the left posterior calf, but no obvious swelling of the entire lower leg.  Patient denies any prior history of DVT or PE, she denies any chest pain, shortness of breath, or palpitations.     Past Medical History:  Diagnosis Date  . Hyperlipemia   . Hypertension     There are no active problems to display for this patient.   History reviewed. No pertinent surgical history.   OB History   None      Home Medications    Prior to Admission medications   Medication Sig Start Date End Date Taking? Authorizing Provider  diclofenac sodium (VOLTAREN) 1 % GEL Apply 2 g topically 4 (four) times daily. 11/09/17   Jacqlyn Larsen, PA-C    Family History No family history on  file.  Social History Social History   Tobacco Use  . Smoking status: Never Smoker  . Smokeless tobacco: Never Used  Substance Use Topics  . Alcohol use: Yes  . Drug use: Never     Allergies   Codeine   Review of Systems Review of Systems  Constitutional: Negative for chills and fever.  Respiratory: Negative for cough, chest tightness, shortness of breath and wheezing.   Cardiovascular: Positive for leg swelling. Negative for chest pain and palpitations.  Gastrointestinal: Negative for nausea and vomiting.  Musculoskeletal: Positive for arthralgias, joint swelling and myalgias.  Skin: Negative for color change, pallor, rash and wound.  Neurological: Negative for weakness and numbness.  All other systems reviewed and are negative.    Physical Exam Updated Vital Signs BP (!) 131/93 (BP Location: Left Arm)   Pulse 83   Temp 98.1 F (36.7 C) (Oral)   Resp 20   Ht 5' 6.5" (1.689 m)   Wt 93 kg (205 lb)   SpO2 97%   BMI 32.59 kg/m   Physical Exam  Constitutional: She appears well-developed and well-nourished. No distress.  HENT:  Head: Normocephalic and atraumatic.  Eyes: Right eye exhibits no discharge. Left eye exhibits no discharge.  Cardiovascular: Normal rate, regular rhythm, normal heart sounds and intact distal pulses.  Pulmonary/Chest: Effort normal  and breath sounds normal. No stridor. No respiratory distress. She has no wheezes. She has no rales.  Respirations equal and unlabored, patient able to speak in full sentences, lungs clear to auscultation bilaterally  Abdominal: Soft. Bowel sounds are normal. She exhibits no distension and no mass. There is no tenderness. There is no guarding.  Musculoskeletal:  Left knee with mild tenderness to palpation anteriorly and posteriorly, there is a mild amount of swelling noted primarily over the anterior and lateral joint line, no overlying erythema or warmth, full flexion extension with some discomfort, mild redness to  palpation over the calf, no overlying erythema or warmth, no obvious palpable cord, ankle without tenderness to palpation, normal range of motion.  2+ DP and TP pulses, sensation intact throughout the left leg, 5/5 dorsi and plantar flexion.  Neurological: She is alert. Coordination normal.  Skin: Skin is warm and dry. Capillary refill takes less than 2 seconds. She is not diaphoretic.  Psychiatric: She has a normal mood and affect. Her behavior is normal.  Nursing note and vitals reviewed.    ED Treatments / Results  Labs (all labs ordered are listed, but only abnormal results are displayed) Labs Reviewed  BASIC METABOLIC PANEL - Abnormal; Notable for the following components:      Result Value   Glucose, Bld 101 (*)    All other components within normal limits  CBC    EKG None  Radiology Dg Ankle Complete Left  Result Date: 11/09/2017 CLINICAL DATA:  Left ankle pain.  No known injury. EXAM: LEFT ANKLE COMPLETE - 3+ VIEW COMPARISON:  None. FINDINGS: Plantar calcaneal spur. No acute bony abnormality. Specifically, no fracture, subluxation, or dislocation. Joint spaces maintained. Soft tissues intact. IMPRESSION: No acute bony abnormality. Electronically Signed   By: Rolm Baptise M.D.   On: 11/09/2017 16:13   Dg Knee Complete 4 Views Left  Result Date: 11/09/2017 CLINICAL DATA:  Left anterior knee pain.  No known injury. EXAM: LEFT KNEE - COMPLETE 4+ VIEW COMPARISON:  None. FINDINGS: The examination is limited by mild obliquity of the lateral view. There is mild posterior patellar spur formation. No visible effusion. IMPRESSION: Mild patellofemoral degenerative change.  No acute abnormality. Electronically Signed   By: Claudie Revering M.D.   On: 11/09/2017 16:00    Procedures Procedures (including critical care time)  Medications Ordered in ED Medications  Rivaroxaban (XARELTO) tablet 15 mg (has no administration in time range)  acetaminophen (TYLENOL) tablet 1,000 mg (1,000 mg Oral  Given 11/09/17 1644)     Initial Impression / Assessment and Plan / ED Course  I have reviewed the triage vital signs and the nursing notes.  Pertinent labs & imaging results that were available during my care of the patient were reviewed by me and considered in my medical decision making (see chart for details).  Patient presents to the ED for evaluation of right knee pain and swelling, and also complaining of some calf cramping, and ankle pain.  Patient denies fevers or chills, vitals are normal here and patient is in no acute distress.  No overlying erythema or warmth, patient is able to fully flex and extend the knee, no concern for septic arthritis or overlying cellulitis.  Patient does have mild swelling, the knee is neurovascularly intact.  3 of the left knee shows mild patellofemoral degenerative changes, no acute fracture, dislocation, no appreciable joint effusion.  Left ankle x-rays are unremarkable.    Given the patient is having some unilateral leg swelling  will arrange for DVT study at [facility] tomorrow morning.  Basic screening labs done, no leukocytosis and normal hemoglobin, no electrolyte derangements and patient has normal renal function.  Will give 1 dose of Xarelto here in the ED to cover for potential DVT until she has her study tomorrow morning.  DVT is on the differential, I am more suspicious of osteoarthritis as a cause of patient's pain will provide with knee sleeve, and patient has been given the option of crutches which were provided here in the ED versus a cane which she has at home.  Will prescribe Voltaren gel, the patient has been instructed not to fill or use this prescription until DVT has been ruled out tomorrow.  Patient can also use Tylenol.  Patient to follow-up with her primary care doctor as well as orthopedics if knee pain is not improving with conservative measures.  Return precautions discussed.  Patient expressed understanding and is in agreement with  plan  Patient discussed with Dr. Tomi Bamberger, who saw patient as well and agrees with plan.  Final Clinical Impressions(s) / ED Diagnoses   Final diagnoses:  Acute pain of left knee  Knee swelling  Leg cramps    ED Discharge Orders        Ordered    LE VENOUS     11/09/17 1758    diclofenac sodium (VOLTAREN) 1 % GEL  4 times daily     11/09/17 1758       Jacqlyn Larsen, PA-C 11/09/17 1814    Dorie Rank, MD 11/12/17 [phone]

## 2017-11-09 NOTE — ED Triage Notes (Signed)
Pt states she is having left knee pain, no noted injury but recently moved and has been going up and down steps more.

## 2017-11-10 ENCOUNTER — Encounter (HOSPITAL_COMMUNITY): Payer: Self-pay

## 2017-11-10 ENCOUNTER — Ambulatory Visit (HOSPITAL_BASED_OUTPATIENT_CLINIC_OR_DEPARTMENT_OTHER)
Admission: RE | Admit: 2017-11-10 | Discharge: 2017-11-10 | Disposition: A | Discharge status: Home / Self Care | Payer: Medicare Other | Source: Ambulatory Visit | Attending: Student | Admitting: Student

## 2017-11-10 ENCOUNTER — Emergency Department (HOSPITAL_COMMUNITY)
Admission: EM | Admit: 2017-11-10 | Discharge: 2017-11-10 | Disposition: A | Discharge status: Home / Self Care | Payer: Medicare Other | Attending: Emergency Medicine | Admitting: Emergency Medicine

## 2017-11-10 DIAGNOSIS — I10 Essential (primary) hypertension: Secondary | ICD-10-CM | POA: Insufficient documentation

## 2017-11-10 DIAGNOSIS — I82442 Acute embolism and thrombosis of left tibial vein: Secondary | ICD-10-CM | POA: Insufficient documentation

## 2017-11-10 DIAGNOSIS — M79609 Pain in unspecified limb: Secondary | ICD-10-CM

## 2017-11-10 DIAGNOSIS — Z7982 Long term (current) use of aspirin: Secondary | ICD-10-CM | POA: Diagnosis not present

## 2017-11-10 DIAGNOSIS — M25562 Pain in left knee: Secondary | ICD-10-CM | POA: Insufficient documentation

## 2017-11-10 DIAGNOSIS — Z79899 Other long term (current) drug therapy: Secondary | ICD-10-CM | POA: Diagnosis not present

## 2017-11-10 DIAGNOSIS — M7989 Other specified soft tissue disorders: Secondary | ICD-10-CM

## 2017-11-10 DIAGNOSIS — I824Y2 Acute embolism and thrombosis of unspecified deep veins of left proximal lower extremity: Secondary | ICD-10-CM | POA: Diagnosis not present

## 2017-11-10 DIAGNOSIS — I824Y3 Acute embolism and thrombosis of unspecified deep veins of proximal lower extremity, bilateral: Secondary | ICD-10-CM | POA: Diagnosis not present

## 2017-11-10 MED ORDER — RIVAROXABAN (XARELTO) EDUCATION KIT FOR AFIB PATIENTS
PACK | Freq: Once | Status: DC
  Filled 2017-11-10: qty 1

## 2017-11-10 MED ORDER — RIVAROXABAN 15 MG PO TABS: 15.0000 mg | ORAL_TABLET | Freq: Once | ORAL | Status: DC

## 2017-11-10 MED ORDER — RIVAROXABAN (XARELTO) VTE STARTER PACK (15 & 20 MG): 1.0000 | ORAL_TABLET | Freq: Every day | ORAL | 0 refills | Status: DC

## 2017-11-10 NOTE — ED Provider Notes (Signed)
[facility] EMERGENCY DEPARTMENT Provider Note   CSN: [id number] Arrival date & time: 11/10/17  1120     History   Chief Complaint Chief Complaint  Patient presents with  . DVT    HPI Marie Ruiz is a 72 y.o. female.  HPI    72 year old female presents today with complaints of left leg pain.  Patient has a significant past medical history of hypertension, hyperlipidemia, glaucoma.  She notes over the last week she has had increasing left knee pain and swelling in her leg.  She notes that she is in the process of moving from [place] to here and has been doing a lot of moving.  Patient notes she was seen in the emergency room yesterday and sent here this morning for DVT study.  Patient notes she had an x-ray yesterday and was prescribed Xarelto and Voltaren gel.  Patient denies any history of DVT or PE, she denies any recent trauma or prolonged immobilization, no estrogen use, no active cancer, no coagulopathies.  She denies any chest pain or shortness of breath.  Patient denies any history of significant bleeding.  Past Medical History:  Diagnosis Date  . Hyperlipemia   . Hypertension     There are no active problems to display for this patient.   History reviewed. No pertinent surgical history.   OB History   None      Home Medications    Prior to Admission medications   Medication Sig Start Date End Date Taking? Authorizing Provider  acetaminophen (TYLENOL) 500 MG tablet Take 500 mg by mouth as needed for mild pain.   Yes [provider]  amLODipine-benazepril (LOTREL) 5-20 MG capsule Take 1 capsule by mouth daily.   Yes [provider]  aspirin (ASPIRIN 81) 81 MG chewable tablet Chew 81 mg by mouth daily.   Yes [provider]  atorvastatin (LIPITOR) 20 MG tablet Take 20 mg by mouth daily.   Yes [provider]  b complex vitamins capsule Take 1 capsule by mouth daily.   Yes [provider]    brimonidine (ALPHAGAN) 0.2 % ophthalmic solution Place into both eyes 2 (two) times daily.   Yes [provider]  cetirizine (ZYRTEC) 10 MG tablet Take 10 mg by mouth daily.   Yes [provider]  cholecalciferol (VITAMIN D) 1000 units tablet Take 1,000 Units by mouth daily.   Yes [provider]  Coenzyme Q10 (COQ10 PO) Take 300 mg by mouth daily.   Yes [provider]  dorzolamide-timolol (COSOPT) 22.3-6.8 MG/ML ophthalmic solution Place 1 drop into both eyes 2 (two) times daily.   Yes [provider]  FLUoxetine (PROZAC) 10 MG capsule Take 10 mg by mouth daily.   Yes [provider]  latanoprost (XALATAN) 0.005 % ophthalmic solution Place 1 drop into both eyes at bedtime.   Yes [provider]  Multiple Vitamin (MULTIVITAMIN) tablet Take 1 tablet by mouth daily.   Yes [provider]  nabumetone (RELAFEN) 500 MG tablet Take 500-1,000 mg by mouth daily.   Yes [provider]  Olopatadine HCl (PAZEO) 0.7 % SOLN Apply 1 drop to eye daily.   Yes [provider]  Omega-3 Fatty Acids (FISH OIL) 1000 MG CAPS Take 1,000 mg by mouth daily.   Yes [provider]  omeprazole (PRILOSEC) 20 MG capsule Take 20 mg by mouth daily.   Yes [provider]  trolamine salicylate (ASPERCREME) 10 % cream Apply 1 application  topically as needed for muscle pain.   Yes [provider]  diclofenac sodium (VOLTAREN) 1 % GEL Apply 2 g topically 4 (four) times daily. 11/09/17   Jacqlyn Larsen, PA-C  Rivaroxaban 15 & 20 MG TBPK Take 1 tablet by mouth daily. Take as directed on package: Start with one 49m tablet by mouth twice a day with food. On Day 22, switch to one 259mtablet once a day with food. 11/10/17   HeOkey RegalPA-C    Family History No family history on file.  Social History Social History   Tobacco Use  . Smoking status: Never Smoker  . Smokeless tobacco: Never Used  Substance Use  Topics  . Alcohol use: Yes  . Drug use: Never     Allergies   Codeine   Review of Systems Review of Systems  All other systems reviewed and are negative.    Physical Exam Updated Vital Signs BP 133/90 (BP Location: Right Arm)   Pulse 68   Temp 98.7 F (37.1 C) (Oral)   Resp 18   SpO2 99%   Physical Exam  Constitutional: She is oriented to person, place, and time. She appears well-developed and well-nourished.  HENT:  Head: Normocephalic and atraumatic.  Eyes: Pupils are equal, round, and reactive to light. Conjunctivae are normal. Right eye exhibits no discharge. Left eye exhibits no discharge. No scleral icterus.  Neck: Normal range of motion. No JVD present. No tracheal deviation present.  Cardiovascular: Normal rate, regular rhythm and normal heart sounds.  No murmur heard. Pulmonary/Chest: Effort normal. No stridor. No respiratory distress. She has no wheezes. She has no rales. She exhibits no tenderness.  Musculoskeletal:  Right knee tenderness palpation of the popliteal fossa, no redness swelling or warmth to touch, no significant laxity, no significant edema in the lower extremity pedal pulse 2+  Neurological: She is alert and oriented to person, place, and time. Coordination normal.  Psychiatric: She has a normal mood and affect. Her behavior is normal. Judgment and thought content normal.  Nursing note and vitals reviewed.    ED Treatments / Results  Labs (all labs ordered are listed, but only abnormal results are displayed) Labs Reviewed - No data to display  EKG None  Radiology Dg Ankle Complete Left  Result Date: 11/09/2017 CLINICAL DATA:  Left ankle pain.  No known injury. EXAM: LEFT ANKLE COMPLETE - 3+ VIEW COMPARISON:  None. FINDINGS: Plantar calcaneal spur. No acute bony abnormality. Specifically, no fracture, subluxation, or dislocation. Joint spaces maintained. Soft tissues intact. IMPRESSION: No acute bony abnormality. Electronically Signed    By: KeRolm Baptise.D.   On: 11/09/2017 16:13   Dg Knee Complete 4 Views Left  Result Date: 11/09/2017 CLINICAL DATA:  Left anterior knee pain.  No known injury. EXAM: LEFT KNEE - COMPLETE 4+ VIEW COMPARISON:  None. FINDINGS: The examination is limited by mild obliquity of the lateral view. There is mild posterior patellar spur formation. No visible effusion. IMPRESSION: Mild patellofemoral degenerative change.  No acute abnormality. Electronically Signed   By: StClaudie Revering.D.   On: 11/09/2017 16:00    Procedures Procedures (including critical care time)  Medications Ordered in ED Medications  rivaroxaban (XARELTO) Education Kit for Afib patients (has no administration in time range)     Initial Impression / Assessment and Plan / ED Course  I have reviewed the triage vital signs and the nursing notes.  Pertinent labs & imaging results that were available during my care of  the patient were reviewed by me and considered in my medical decision making (see chart for details).     Labs:   Imaging: DVT study   Consults:  Therapeutics:  Discharge Meds: Xarelto  Assessment/Plan: 72 year old female presents today with complaints of leg pain.  This is likely secondary to DVT.  Patient does not have any signs or symptoms consistent with PE, this appears to be uncomplicated DVT with no need for immediate intervention.  Patient will be placed on Xarelto as she has no significant contraindications.  Patient will follow up with her primary care provider and establish new primary care here in [place].  Patient had laboratory analysis yesterday with no significant abnormalities.  Patient is given strict return precautions, she verbalized understanding and agreement to today's plan had no further questions or concerns at the time of discharge.   Final Clinical Impressions(s) / ED Diagnoses   Final diagnoses:  Acute deep vein thrombosis (DVT) of proximal vein of left lower extremity [facility])     ED Discharge Orders        Ordered    Rivaroxaban 15 & 20 MG TBPK  Daily     11/10/17 1439       Okey Regal, PA-C 11/10/17 1442    Pattricia Boss, MD 11/11/17 1025

## 2017-11-10 NOTE — ED Notes (Signed)
Pharmacy at bedside

## 2017-11-10 NOTE — Progress Notes (Signed)
*  Preliminary Results* Left lower extremity venous duplex completed. Left lower extremity is positive for acute deep vein thrombosis involving the left tibioperoneal trunk and a small segment of the left posterior tibial vein at the ankle. There is no evidence of left Baker's cyst.  Patient will be referred to the emergency department for treatment.  11/10/2017 11:14 AM  Maudry Mayhew, BS, RVT, RDCS, RDMS

## 2017-11-10 NOTE — ED Triage Notes (Signed)
PT seen at [facility] yesterday for LLE pain. [place] today revealed + DVT. Pt was given 1 dose of xarelto yesterday. Denies SOB, CO

## 2017-11-10 NOTE — Discharge Instructions (Addendum)
Please read attached information. If you experience any new or worsening signs or symptoms please return to the emergency room for evaluation. Please follow-up with your primary care provider or specialist as discussed. Please use medication prescribed only as directed and discontinue taking if you have any concerning signs or symptoms.   °

## 2017-11-11 ENCOUNTER — Telehealth (RURAL_HEALTH_CENTER): Payer: Self-pay

## 2017-11-11 NOTE — Telephone Encounter (Signed)
Please be aware of note below....bc

## 2017-11-11 NOTE — Telephone Encounter (Signed)
Patient is out of town and went to the er and there is a blood clot in her leg.  They might let her drive home this weekend and told her to see her PCP as soon as she can.  She has been put on schedule with Dr. Hyacinth Meeker for Monday 11/17/2017 at 8:45.

## 2017-11-17 ENCOUNTER — Ambulatory Visit: Payer: Medicare Other | Attending: Family Medicine | Admitting: Family Medicine

## 2017-11-17 ENCOUNTER — Other Ambulatory Visit
Admission: RE | Admit: 2017-11-17 | Discharge: 2017-11-17 | Disposition: A | Discharge status: Home / Self Care | Payer: BC Managed Care – PPO | Source: Ambulatory Visit | Attending: Family Medicine | Admitting: Family Medicine

## 2017-11-17 ENCOUNTER — Encounter (RURAL_HEALTH_CENTER): Payer: Self-pay | Admitting: Family Medicine

## 2017-11-17 VITALS — BP 125/67 | HR 73 | Temp 98.6°F | Resp 18 | Wt 211.3 lb

## 2017-11-17 DIAGNOSIS — M1712 Unilateral primary osteoarthritis, left knee: Secondary | ICD-10-CM

## 2017-11-17 DIAGNOSIS — I82402 Acute embolism and thrombosis of unspecified deep veins of left lower extremity: Secondary | ICD-10-CM

## 2017-11-17 MED ORDER — DICLOFENAC SODIUM 1 % TD GEL
2.00 g | Freq: Four times a day (QID) | TRANSDERMAL | 2 refills | Status: DC
Start: 2017-11-17 — End: 2018-01-15

## 2017-11-17 MED ORDER — RIVAROXABAN 20 MG PO TABS
20.00 mg | ORAL_TABLET | Freq: Every day | ORAL | 2 refills | Status: DC
Start: 2017-11-17 — End: 2018-03-11

## 2017-11-17 NOTE — Progress Notes (Signed)
[facility]  [facility] [place]    History of Presenting Illness:     Chief Complaint   Patient presents with   . Follow-up     DVT   left    . health maintenance     CT lung screening       Brittany Berry is a 72 y.o.. female who presents to the office for Follow-up from a recent visit in [place] to the hospital for a DVT. She started having pain and swelling behind her left knee a week and a half ago. She presented to a hospital in [facility] where she plans to move in the near future. She was diagnosed with a DVT and placed on Xarelto. She's currently on a starter pack. She is here for follow-up in regards to that visit. Her leg is feeling much better and much less painful. She has no identifiable risk factors for DVT. No family history of DVT, no recent surgery or prolonged immobilization. Otherwise she feels well. Her knee continues to bother her and she does have some arthritic symptoms in her knee. She is requesting diclofenac 1% gel to apply topically. This has worked well for her.  I have reviewed care everywhere and her visit to the medical facilities in [place] where venous Doppler ultrasound was performed and was positive for a DVT below her knee.    Review of Systems:   ROS  In addition to those noted in history of present illness all other review of systems performed and were negative.    Past Medical History:     Past Medical History:   Diagnosis Date   . Arthritis    . Chronic headache 03/30/2013   . Chronic right-sided low back pain without sciatica 02/12/2016   . Cyst of breast    . Depression    . Diabetes mellitus     [facility]- NO MEDS REQUIRED AT THIS TIME.   Marland Kitchen Dyslipidemia 03/30/2013   . Essential hypertension, benign    . GERD (gastroesophageal reflux disease) 03/30/2013   . Graves disease 03/30/2013   . High cholesterol    . Mild cognitive impairment with memory loss 10/30/2016   . Mixed  hyperlipidemia    . Osteoarthritis 03/30/2013   . Osteopenia 03/30/2013   . Pre-diabetes 02/23/2014   . Restless legs syndrome (RLS)    . Tendonitis    . 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 Unspecified glaucoma(365.9)        Past Surgical History:     Past Surgical History:   Procedure Laterality Date   . BREAST CYST ASPIRATION     . CATARACT EXTRACTION W/ INTRAOCULAR LENS IMPLANT  08/2013    right eye   . COLONOSCOPY  02/11/2013    Procedure: COLONOSCOPY;  Surgeon: Dineen Kid, MD;  [facility];  Service: Gastroenterology;  Laterality: N/A;   . COLONOSCOPY  08/14    - colonoscopy   . DILATION AND CURETTAGE OF UTERUS     . EXCISION, BONE SPUR      left little toe    . EXTRACTION, CATARACT, PHACO, IOL Left 08/07/2016    Procedure: EXTRACTION, CATARACT, PHACO, IOL;  Surgeon: Su Grand, MD;  [facility];  Service: Ophthalmology;  Laterality: Left;       Family History:     Family History   Problem  Relation Age of Onset   . Breast cancer Sister    . Cancer Other         BREAST CANCER.   . Colon polyps Other    . Anesthesia problems Other    . Heart disease Mother    . Heart attack Father    . Asthma Brother    . Heart disease Maternal Grandfather    . Heart disease Paternal Grandfather    . Cancer Sister    . Arthritis Sister    . COPD Sister    . Diabetes Sister    . Vasculitis Brother        Social History:     History   Smoking Status   . Former Smoker   . Quit date: 07/22/1982   Smokeless Tobacco   . Never Used     History   Alcohol Use   . Yes     Comment: wine 3/week     History   Drug Use No       Allergies:     Allergies   Allergen Reactions   . Codeine Nausea Only   . Prednisone      Causes depression       Medications:     Prior to Admission medications    Medication Sig Start Date End Date Taking? Authorizing Provider   amitriptyline (ELAVIL) 10 MG tablet Take 1 tablet (10 mg total) by mouth nightly as needed for Sleep. 05/26/17  Yes Marchia Meiers, MD   amLODIPine-benazepril (LOTREL 5-20) 5-20 MG per capsule TAKE ONE CAPSULE BY MOUTH EVERY DAY 05/12/17  Yes Marchia Meiers, MD   atorvastatin (LIPITOR) 20 MG tablet Take 1 tablet (20 mg total) by mouth daily. 08/19/17  Yes Marchia Meiers, MD   b complex vitamins capsule Take 1 capsule by mouth daily.   Yes [provider]   bimatoprost (LUMIGAN) 0.03 % ophthalmic drops Place 1 drop into both eyes nightly.   Yes [provider]   brimonidine (ALPHAGAN) 0.2 % ophthalmic solution Place 1 drop into both eyes 2 (two) times daily.   Yes [provider]   Cetirizine HCl (ZYRTEC PO) Take 10 mg by mouth as needed.    Yes [provider]   Cholecalciferol (VITAMIN D) 1000 UNIT tablet Take 1,000 Units by mouth daily.   Yes [provider]   CO-ENZYME Q-10 PO Take 300 mg by mouth daily.   Yes [provider]   dorzolamide-timolol (COSOPT) 22.3-6.8 MG/ML ophthalmic solution Apply 1 drop to eye 2 (two) times daily.   Yes [provider]   FLUoxetine (PROZAC) 10 MG capsule TAKE ONE CAPSULE BY MOUTH EVERY DAY 03/21/17  Yes Marchia Meiers, MD   ipratropium (ATROVENT) 0.06 % nasal spray 2 sprays by Nasal route 3 (three) times daily as needed for Rhinitis. 11/07/14  Yes Marchia Meiers, MD   latanoprost Harrel Lemon) 0.005 % ophthalmic solution  02/18/14  Yes [provider]   Mometasone Furoate (NASONEX NA) 50 mcg by Nasal route as needed.   Yes [provider]   Multiple Vitamin (MULTIVITAMIN) tablet Take 1 tablet by mouth daily.   Yes [provider]   Olopatadine HCl (PAZEO) 0.7 % Solution Apply to eye.   Yes [provider]   Omega-3 Fatty Acids (FISH OIL BURP-LESS) 1000 MG Cap Take 1 capsule by mouth daily.   Yes [provider]   omeprazole (PRILOSEC) 20 MG capsule TAKE ONE  CAPSULE BY MOUTH ONCE DAILY 11/25/16  Yes Marchia Meiers, MD   aspirin 81 MG tablet Take 1 tablet (81 mg total) by  mouth daily. 02/12/16 11/17/17 Yes Marchia Meiers, MD   nabumetone (RELAFEN) 500 MG tablet Take 2 tablets (1,000 mg total) by mouth daily. 11/04/16 11/17/17 Yes Marchia Meiers, MD   rivaroxaban (XARELTO) 15 MG Tab Take 15 mg by mouth every 12 (twelve) hours  11/17/17 Yes [provider]   diclofenac sodium (VOLTAREN) 1 % Gel topical gel Apply 2 g topically 4 (four) times daily 11/17/17   Marchia Meiers, MD   rivaroxaban (XARELTO) 20 MG Tab Take 1 tablet (20 mg total) by mouth daily with dinner 11/17/17   Marchia Meiers, MD       Physical Exam:     Vitals:    11/17/17 0846   BP: 125/67   Pulse: 73   Resp: 18   Temp: 98.6 F (37 C)   SpO2: 99%     Body mass index is 33.24 kg/m.    Physical Exam   Constitutional: She is oriented to person, place, and time. She appears well-developed. No distress.   HENT:   Head: Normocephalic and atraumatic.   Right Ear: External ear normal.   Left Ear: External ear normal.   Eyes: Pupils are equal, round, and reactive to light. EOM are normal.   Neck: Normal range of motion. Neck supple.   Cardiovascular: Normal rate and regular rhythm.    Pulmonary/Chest: Effort normal and breath sounds normal.   Abdominal: Soft.   Musculoskeletal: Normal range of motion. She exhibits edema and tenderness. She exhibits no deformity.   She does have some arthritic changes of the left knee but no joint effusion. There is minimal swelling in the left lower extremity. No redness   Neurological: She is alert and oriented to person, place, and time.   Skin: Skin is warm and dry. Capillary refill takes 2 to 3 seconds. No rash noted. She is not diaphoretic. No erythema.   Psychiatric: She has a normal mood and affect. Her behavior is normal. Judgment and thought content normal.   Nursing note and vitals reviewed.      Assessment:     1. Acute deep vein thrombosis (DVT) of left lower extremity, unspecified vein  Thrombophilia panel   2. Primary osteoarthritis of left knee          Plan:   1. Hypercoagulability profile is drawn today.  2. Prescription written for Xarelto 20 mg to be taken daily when she finishes her Xarelto starter pack. Currently taking 15 mg twice a day.  3. She will stay on Xarelto for 6 months. At that point in time they will be reevaluated as to the need for long-term anticoagulation possibly at a lower dose of Xarelto.  4. She does plan to move to [facility] permanently sometime in the near future after she sells her home here locally. She obviously doesn't know when that will be. I will see her back as scheduled in follow-up on June 27. Return sooner if needed.    Signed by Evangeline Gula M.D.    This note was completed using [employer]ic[employer], medical. Grammatical errors, random word insertions, pronoun errors and incomplete sentences are occasional consequences of this technology due to software limitations. If there are questions or concerns about the content of this note or information contained within the body of this dictation they should be addressed  with the provider for clarification.

## 2017-11-18 DIAGNOSIS — I82402 Acute embolism and thrombosis of unspecified deep veins of left lower extremity: Secondary | ICD-10-CM | POA: Diagnosis not present

## 2017-11-20 DIAGNOSIS — I82402 Acute embolism and thrombosis of unspecified deep veins of left lower extremity: Secondary | ICD-10-CM | POA: Diagnosis not present

## 2017-11-20 LAB — REFLEX - DRVVT CONFIRMATION: dRVVT Confirmation: NEGATIVE

## 2017-11-20 LAB — REFLEX-HEXAGONAL PHASE CONF: Hexagonal Phase Confirmation: POSITIVE — AB

## 2017-11-27 ENCOUNTER — Telehealth (RURAL_HEALTH_CENTER): Payer: Self-pay

## 2017-11-27 NOTE — Telephone Encounter (Signed)
Patient is calling in for results of recent lab work. [facility] LPN

## 2017-11-27 NOTE — Telephone Encounter (Signed)
Patient notified//close encounter//cme

## 2017-11-27 NOTE — Telephone Encounter (Signed)
It took me a while to find all of her lab tests as they had not been reported by [facility]. Labs look good. She does not have any identifiable genetic factors which would predispose her to blood clots. Obviously she has some reason that we cannot yet identify as to why she had a blood clot in the first place. I would recommend she continue on her oral anticoagulation for at least 6 months and then reevaluate at that time. Her homocystine level is still pending and I will let her know if there is a problem with this test.

## 2017-11-27 NOTE — Telephone Encounter (Signed)
Left a message for the patient to call the office. [facility] LPN

## 2017-12-02 LAB — VH MISCELLANEOUS TEST: Test code and name of test: 14934

## 2017-12-04 ENCOUNTER — Telehealth (RURAL_HEALTH_CENTER): Payer: Self-pay

## 2017-12-04 NOTE — Telephone Encounter (Signed)
Please see if the patient wants to be seen in [place] [facility] and get appt there next week.//bc

## 2017-12-04 NOTE — Telephone Encounter (Signed)
Please see request below.//bc

## 2017-12-04 NOTE — Telephone Encounter (Signed)
Appointment was made for 5/22 in the [place] clinic//close encounter//cme

## 2017-12-04 NOTE — Telephone Encounter (Signed)
Patient does not feel that her pain from the blood clot is moving in the right direction.  States she is still in a lot of pain and swelling.  The pain has moved from moved behind the knee to the inside of the knee.  Has been on Xarelto since April 21.  Can she see Dr. Hyacinth Meeker sooner than 5/23 or can he send her somewhere else?

## 2017-12-04 NOTE — Telephone Encounter (Signed)
Probably place to see her here in the right. We can work her in a week in [place] clinic.

## 2017-12-10 ENCOUNTER — Ambulatory Visit: Payer: Medicare Other | Attending: Family Medicine | Admitting: Family Medicine

## 2017-12-10 ENCOUNTER — Encounter (RURAL_HEALTH_CENTER): Payer: Self-pay | Admitting: Family Medicine

## 2017-12-10 ENCOUNTER — Ambulatory Visit
Admission: RE | Admit: 2017-12-10 | Discharge: 2017-12-10 | Disposition: A | Discharge status: Home / Self Care | Payer: Medicare Other | Source: Ambulatory Visit | Attending: Family Medicine | Admitting: Family Medicine

## 2017-12-10 VITALS — BP 127/68 | HR 67 | Temp 97.6°F

## 2017-12-10 DIAGNOSIS — M25462 Effusion, left knee: Secondary | ICD-10-CM | POA: Insufficient documentation

## 2017-12-10 DIAGNOSIS — M25562 Pain in left knee: Secondary | ICD-10-CM

## 2017-12-10 DIAGNOSIS — M1712 Unilateral primary osteoarthritis, left knee: Secondary | ICD-10-CM

## 2017-12-10 MED ORDER — LIDOCAINE HCL (PF) 1 % IJ SOLN
2.00 mL | Freq: Once | INTRAMUSCULAR | Status: AC
Start: 2017-12-10 — End: ?

## 2017-12-10 MED ORDER — METHYLPREDNISOLONE ACETATE 80 MG/ML IJ SUSP
80.00 mg | Freq: Once | INTRAMUSCULAR | Status: AC
Start: 2017-12-10 — End: ?

## 2017-12-10 MED ORDER — TRAMADOL HCL 50 MG PO TABS
50.00 mg | ORAL_TABLET | Freq: Three times a day (TID) | ORAL | 0 refills | Status: AC | PRN
Start: 2017-12-10 — End: 2017-12-17

## 2017-12-10 NOTE — Progress Notes (Signed)
[facility]  [facility] [place]    History of Presenting Illness:     Chief Complaint   Patient presents with   . Follow-up     hospital f/u from April for blood clot behind left knee, still swollen, feels warm, painful   . Medication Refill     Diclofenac gel       Brittany Berry is a 72 y.o. female who presents to the office with concern for Pain and swelling of her left knee. He was actually diagnosed with a DVT in [facility] one month ago. She was placed on Xarelto. Today she complains of a lot of pain and swelling in her left knee. This is actually very similar to the pain and swelling she was experiencing a month ago. I suspect that she was having problems with arthritic pain and swelling in her knee at that time and a DVT was diagnosed incidentally as she had an ultrasound of the deep vein system of her left lower extremity.  She's not had any injury but it is very painful to walk.    Review of Systems:   ROS  In addition to those noted in history of present illness all other review of systems performed and were negative.    Past Medical History:     Past Medical History:   Diagnosis Date   . Arthritis    . Chronic headache 03/30/2013   . Chronic right-sided low back pain without sciatica 02/12/2016   . Cyst of breast    . Depression    . Diabetes mellitus     [facility]- NO MEDS REQUIRED AT THIS TIME.   Marland Kitchen Dyslipidemia 03/30/2013   . Essential hypertension, benign    . GERD (gastroesophageal reflux disease) 03/30/2013   . Graves disease 03/30/2013   . High cholesterol    . Mild cognitive impairment with memory loss 10/30/2016   . Mixed hyperlipidemia    . Osteoarthritis 03/30/2013   . Osteopenia 03/30/2013   . Pre-diabetes 02/23/2014   . Restless legs syndrome (RLS)    . Tendonitis    . 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 Unspecified glaucoma(365.9)        Past Surgical History:     Past Surgical History:    Procedure Laterality Date   . BREAST CYST ASPIRATION     . CATARACT EXTRACTION W/ INTRAOCULAR LENS IMPLANT  08/2013    right eye   . COLONOSCOPY  02/11/2013    Procedure: COLONOSCOPY;  Surgeon: Dineen Kid, MD;  [facility];  Service: Gastroenterology;  Laterality: N/A;   . COLONOSCOPY  08/14    - colonoscopy   . DILATION AND CURETTAGE OF UTERUS     . EXCISION, BONE SPUR      left little toe    . EXTRACTION, CATARACT, PHACO, IOL Left 08/07/2016    Procedure: EXTRACTION, CATARACT, PHACO, IOL;  Surgeon: Su Grand, MD;  [facility];  Service: Ophthalmology;  Laterality: Left;       Family History:     Family History   Problem Relation Age of Onset   . Breast cancer Sister    . Cancer Other         BREAST CANCER.   . Colon polyps Other    . Anesthesia problems Other    . Heart disease Mother    . Heart  attack Father    . Asthma Brother    . Heart disease Maternal Grandfather    . Heart disease Paternal Grandfather    . Cancer Sister    . Arthritis Sister    . COPD Sister    . Diabetes Sister    . Vasculitis Brother        Social History:     History   Smoking Status   . Former Smoker   . Quit date: 07/22/1982   Smokeless Tobacco   . Never Used     History   Alcohol Use   . Yes     Comment: wine 3/week     History   Drug Use No       Allergies:     Allergies   Allergen Reactions   . Codeine Nausea Only   . Prednisone      Causes depression       Medications:     Prior to Admission medications    Medication Sig Start Date End Date Taking? Authorizing Provider   amitriptyline (ELAVIL) 10 MG tablet Take 1 tablet (10 mg total) by mouth nightly as needed for Sleep. 05/26/17  Yes Marchia Meiers, MD   amLODIPine-benazepril (LOTREL 5-20) 5-20 MG per capsule TAKE ONE CAPSULE BY MOUTH EVERY DAY 05/12/17  Yes Marchia Meiers, MD   atorvastatin (LIPITOR) 20 MG tablet Take 1 tablet (20 mg total) by mouth daily. 08/19/17  Yes Marchia Meiers, MD   b complex vitamins capsule Take 1  capsule by mouth daily.   Yes [provider]   bimatoprost (LUMIGAN) 0.03 % ophthalmic drops Place 1 drop into both eyes nightly.   Yes [provider]   brimonidine (ALPHAGAN) 0.2 % ophthalmic solution Place 1 drop into both eyes 2 (two) times daily.   Yes [provider]   Cetirizine HCl (ZYRTEC PO) Take 10 mg by mouth as needed.    Yes [provider]   Cholecalciferol (VITAMIN D) 1000 UNIT tablet Take 1,000 Units by mouth daily.   Yes [provider]   CO-ENZYME Q-10 PO Take 300 mg by mouth daily.   Yes [provider]   diclofenac sodium (VOLTAREN) 1 % Gel topical gel Apply 2 g topically 4 (four) times daily 11/17/17  Yes Marchia Meiers, MD   dorzolamide-timolol (COSOPT) 22.3-6.8 MG/ML ophthalmic solution Apply 1 drop to eye 2 (two) times daily.   Yes [provider]   FLUoxetine (PROZAC) 10 MG capsule TAKE ONE CAPSULE BY MOUTH EVERY DAY 03/21/17  Yes Marchia Meiers, MD   ipratropium (ATROVENT) 0.06 % nasal spray 2 sprays by Nasal route 3 (three) times daily as needed for Rhinitis. 11/07/14  Yes Marchia Meiers, MD   latanoprost Harrel Lemon) 0.005 % ophthalmic solution  02/18/14  Yes [provider]   Mometasone Furoate (NASONEX NA) 50 mcg by Nasal route as needed.   Yes [provider]   Multiple Vitamin (MULTIVITAMIN) tablet Take 1 tablet by mouth daily.   Yes [provider]   Olopatadine HCl (PAZEO) 0.7 % Solution Apply to eye.   Yes [provider]   Omega-3 Fatty Acids (FISH OIL BURP-LESS) 1000 MG Cap Take 1 capsule by mouth daily.   Yes [provider]   omeprazole (PRILOSEC) 20 MG capsule TAKE ONE CAPSULE BY MOUTH ONCE DAILY 11/25/16  Yes Marchia Meiers, MD   rivaroxaban (XARELTO) 20 MG Tab Take 1 tablet (20 mg total) by  mouth daily with dinner 11/17/17  Yes Marchia Meiers, MD   traMADol (ULTRAM) 50 MG tablet Take 1 tablet (50 mg total) by mouth every 8 (eight) hours as needed  for Pain 12/10/17 12/17/17  Marchia Meiers, MD       Physical Exam:     Vitals:    12/10/17 1053   BP: 127/68   Pulse: 67   Temp: 97.6 F (36.4 C)   SpO2: 100%     There is no height or weight on file to calculate BMI.    Physical Exam   Constitutional: She is oriented to person, place, and time. She appears well-developed.   She certainly appears uncomfortable with pain with any weightbearing on the left lower extremity.   HENT:   Head: Normocephalic and atraumatic.   Eyes: Pupils are equal, round, and reactive to light. EOM are normal.   Cardiovascular: Normal rate, regular rhythm and normal heart sounds.    Pulmonary/Chest: Effort normal and breath sounds normal.   Abdominal: Soft.   Musculoskeletal: Normal range of motion. She exhibits tenderness. She exhibits no deformity.   Left knee has a moderate sized joint effusion. No erythema. There is no Pain and swelling or tenderness.   Neurological: She is alert and oriented to person, place, and time.   Skin: Skin is warm and dry. She is not diaphoretic. No erythema.   Psychiatric: She has a normal mood and affect. Her behavior is normal. Judgment and thought content normal.   Nursing note and vitals reviewed.      Assessment:     1. Primary osteoarthritis of left knee  methylPREDNISolone acetate (DEPO-MEDROL) injection 80 mg    lidocaine (PF) (XYLOCAINE) 1 % injection 2 mL    XR Knee 4+ views left    Ambulatory referral to Physical Therapy   2. Acute pain of left knee  methylPREDNISolone acetate (DEPO-MEDROL) injection 80 mg    lidocaine (PF) (XYLOCAINE) 1 % injection 2 mL    XR Knee 4+ views left    Ambulatory referral to Physical Therapy       Plan:   1. I suspect her pain in her left leg and knee has been arthritic in nature and the DVT was found incidentally. She will stay on Xarelto 20 mg daily.  2. Procedure note: After informed consent patient was placed in the seated position on the exam table. Knee was prepped with Betadine. Aseptic technique was  used. Depo-Medrol 80 mg along with 2 mL of 1% plain Xylocaine was injected into the left knee joint space without difficulty. I was not able to aspirate any fluid out of the joint.  3. Tramadol 50 mg every 8 hours with Tylenol if needed for pain.  4. Patient was discharged in wheelchair. She is to limit weightbearing for now. Apply heat and ice therapy. We will order x-rays and physical therapy.  5. I'll see her back in one month as previously scheduled. Return sooner if needed.    Signed by Evangeline Gula M.D.    This note was completed using Physicist, medical. Grammatical errors, random word insertions, pronoun errors and incomplete sentences are occasional consequences of this technology due to software limitations. If there are questions or concerns about the content of this note or information contained within the body of this dictation they should be addressed with the provider for clarification.

## 2017-12-11 ENCOUNTER — Telehealth (RURAL_HEALTH_CENTER): Payer: Self-pay

## 2017-12-11 NOTE — Telephone Encounter (Signed)
Left message for patient to call the office. TK MA

## 2017-12-11 NOTE — Telephone Encounter (Signed)
-----   Message from Marchia Meiers, MD sent at 12/11/2017  9:42 AM EDT -----  Patient does have fluid on her knee as noted on x-ray as well as some mild degenerative arthritis. Hopefully she will get relief from the shot I gave her yesterday.

## 2017-12-12 NOTE — Telephone Encounter (Signed)
Left voicemail message for patient to call the office. TK MA

## 2017-12-12 NOTE — Telephone Encounter (Signed)
Patient is aware and voices understanding of xray results/. TK MA

## 2017-12-16 DIAGNOSIS — H04123 Dry eye syndrome of bilateral lacrimal glands: Secondary | ICD-10-CM | POA: Diagnosis not present

## 2017-12-16 DIAGNOSIS — H401133 Primary open-angle glaucoma, bilateral, severe stage: Secondary | ICD-10-CM | POA: Diagnosis not present

## 2017-12-22 ENCOUNTER — Telehealth (RURAL_HEALTH_CENTER): Payer: Self-pay | Admitting: Family Medicine

## 2017-12-22 NOTE — Telephone Encounter (Signed)
Patient is out of town and does not have her Fluoxetine.  She is asking if three pills could be sent into [facility] on [address] in [place], [place].  The phone number there is [phone].

## 2017-12-22 NOTE — Telephone Encounter (Signed)
We received this request via fax also for Dr. Hyacinth Meeker to approve and sign will fax as soon as he completes.sent Marsa Aris

## 2017-12-23 ENCOUNTER — Encounter: Payer: Self-pay | Admitting: Primary Care

## 2017-12-23 ENCOUNTER — Ambulatory Visit (INDEPENDENT_AMBULATORY_CARE_PROVIDER_SITE_OTHER): Payer: Medicare Other | Admitting: Primary Care

## 2017-12-23 VITALS — BP 126/76 | HR 56 | Temp 98.1°F | Ht 66.5 in | Wt 204.5 lb

## 2017-12-23 DIAGNOSIS — F411 Generalized anxiety disorder: Secondary | ICD-10-CM

## 2017-12-23 DIAGNOSIS — I82409 Acute embolism and thrombosis of unspecified deep veins of unspecified lower extremity: Secondary | ICD-10-CM | POA: Insufficient documentation

## 2017-12-23 DIAGNOSIS — I82432 Acute embolism and thrombosis of left popliteal vein: Secondary | ICD-10-CM

## 2017-12-23 DIAGNOSIS — M1990 Unspecified osteoarthritis, unspecified site: Secondary | ICD-10-CM | POA: Insufficient documentation

## 2017-12-23 DIAGNOSIS — H409 Unspecified glaucoma: Secondary | ICD-10-CM

## 2017-12-23 DIAGNOSIS — E0500 Thyrotoxicosis with diffuse goiter without thyrotoxic crisis or storm: Secondary | ICD-10-CM

## 2017-12-23 DIAGNOSIS — M150 Primary generalized (osteo)arthritis: Secondary | ICD-10-CM

## 2017-12-23 DIAGNOSIS — M159 Polyosteoarthritis, unspecified: Secondary | ICD-10-CM

## 2017-12-23 DIAGNOSIS — I10 Essential (primary) hypertension: Secondary | ICD-10-CM

## 2017-12-23 DIAGNOSIS — E785 Hyperlipidemia, unspecified: Secondary | ICD-10-CM | POA: Diagnosis not present

## 2017-12-23 MED ORDER — FLUOXETINE HCL 10 MG PO CAPS
ORAL_CAPSULE | ORAL | 0 refills | Status: DC
Start: 2017-12-23 — End: 2020-02-10

## 2017-12-23 NOTE — Assessment & Plan Note (Signed)
Diagnosed in 2014 based off of records, not on medications. Will continue to monitor TSH. Last TSH in April 2019 unremarkable.

## 2017-12-23 NOTE — Assessment & Plan Note (Signed)
Chronic, mostly to left knee.  Encouraged her to limit and wean off of Tramadol, use other medications as prescribed. Undergoing injections.  She will start PT in [place] soon.

## 2017-12-23 NOTE — Assessment & Plan Note (Signed)
Lipid panel stable from April 2019, continue atorvastatin.

## 2017-12-23 NOTE — Assessment & Plan Note (Signed)
Diagnosed in April 2019, compliant to Xarelto.  Continue Xarelto and follow up with current PCP.

## 2017-12-23 NOTE — Patient Instructions (Signed)
I sent 3 capsules of your fluoxetine to the pharmacy.  Please schedule a follow up visit with me once you've officially moved to [facility].  It was a pleasure to meet you today! Please don't hesitate to call or message me with any questions. Welcome to [facility]!

## 2017-12-23 NOTE — Assessment & Plan Note (Signed)
Following with Ophthalmology, continue current regimen.

## 2017-12-23 NOTE — Assessment & Plan Note (Signed)
Doing well on Fluoxetine. Refill for three capsules sent to pharmacy as requested. Continue current regimen.

## 2017-12-23 NOTE — Progress Notes (Signed)
Subjective:    Patient ID: Marie Ruiz, female    DOB: [birth year], 72 y.o.   MRN: [id number]  HPI  Marie Ruiz is a 72 year old female who presents today to establish care and discuss the problems mentioned below. Will obtain old records. She is currently following with her PCP in [place] but is wanting to get connected to a PCP in [place] for when she moves. She plans on moving within the next several months.   1) Essential Hypertension: Currently managed on amlodipine-benazepril 5-20 mg. She denies chest pain, dizziness, headaches.  BP Readings from Last 3 Encounters:  12/23/17 126/76  11/10/17 (!) 137/95  11/09/17 (!) 131/93   2) DVT: Diagnosed on April 22nd, located to left popliteal fossa. Currently managed on aspirin 81 mg, Rivaroxaban. She was told by her PCP take her Rivaroxaban for 6 months total. She was also provided with Tramadol 50 mg for which she takes every 8 hours scheduled, also taking Tylenol 1000 mg every 8 hours.   3) Osteoarthritis: Chronic back and left knee pain. Currently managed on Amitriptyline 10 mg, nabumetone 500 mg BID PRN, Voltaren Gel PRN. She was referred to physical therapy in [place] and will be starting soon. She has undergone knee injections in the past with her last injection being in late May 2019.  She is not following with orthopedist in [place].   4) Depression and Anxiety: Diagnosed years ago. Currently managed on fluoxetine 10 mg daily and Amitriptyline 10 mg PRN for sleep. She recently filled her fluoxetine in [place] and accidentally left the bottle at home in [place]. She will run out in one day and is needing a few pills to cover until she returns home.   5) Glaucoma: Currently following with ophthalmology in [place]. Compliant to her regimen.   6) Hyperlipidemia: Currently managed on atorvastatin 20 mg.   7) Graves Disease: Diagnosed in 2014. She was seeing an endocrinologist, last visit being several years ago. She is not  currently on medication. TSH was last checked in March 2019 by current PCP in [place], level of 1.43.  Review of Systems  Constitutional: Negative for fever.  Eyes:       Denies acute visual changes  Respiratory: Negative for shortness of breath.   Cardiovascular: Negative for chest pain.  Genitourinary:       Post menopausal   Musculoskeletal: Positive for arthralgias.  Skin: Negative for color change.  Neurological: Negative for dizziness and headaches.  Hematological: Negative for adenopathy.  Psychiatric/Behavioral:       Feels well managed on fluoxetine       Past Medical History:  Diagnosis Date  . Arthritis   . Bone spur    with excision  . Chickenpox   . DVT (deep venous thrombosis) ([place])   . Glaucoma   . Graves disease 03/30/2013  . Hyperlipemia   . Hypertension   . Osteopenia   . Prediabetes   . Restless leg syndrome      Social History   Socioeconomic History  . Marital status: Married    Spouse name: Not on file  . Number of children: Not on file  . Years of education: Not on file  . Highest education level: Not on file  Occupational History  . Not on file  Social Needs  . Financial resource strain: Not on file  . Food insecurity:    Worry: Not on file    Inability: Not on file  . Transportation needs:  Medical: Not on file    Non-medical: Not on file  Tobacco Use  . Smoking status: Never Smoker  . Smokeless tobacco: Never Used  Substance and Sexual Activity  . Alcohol use: Yes  . Drug use: Never  . Sexual activity: Not on file  Lifestyle  . Physical activity:    Days per week: Not on file    Minutes per session: Not on file  . Stress: Not on file  Relationships  . Social connections:    Talks on phone: Not on file    Gets together: Not on file    Attends religious service: Not on file    Active member of club or organization: Not on file    Attends meetings of clubs or organizations: Not on file    Relationship status: Not on  file  . Intimate partner violence:    Fear of current or ex partner: Not on file    Emotionally abused: Not on file    Physically abused: Not on file    Forced sexual activity: Not on file  Other Topics Concern  . Not on file  Social History Narrative   Lives in [place], moving to [place].   Retired.   Married.     Past Surgical History:  Procedure Laterality Date  . BREAST CYST ASPIRATION    . CATARACT EXTRACTION W/ INTRAOCULAR LENS IMPLANT Right 2015  . CATARACT EXTRACTION W/PHACO Left 2017  . DILATION AND CURETTAGE OF UTERUS      Family History  Problem Relation Age of Onset  . Heart disease Mother   . Heart attack Father   . Breast cancer Sister     Allergies  Allergen Reactions  . Codeine   . Prednisone     Depression    Current Outpatient Medications on File Prior to Visit  Medication Sig Dispense Refill  . acetaminophen (TYLENOL) 500 MG tablet Take 500 mg by mouth as needed for mild pain.    Marland Kitchen amLODipine-benazepril (LOTREL) 5-20 MG capsule Take 1 capsule by mouth daily.    Marland Kitchen aspirin (ASPIRIN 81) 81 MG chewable tablet Chew 81 mg by mouth daily.    Marland Kitchen atorvastatin (LIPITOR) 20 MG tablet Take 20 mg by mouth daily.    Marland Kitchen b complex vitamins capsule Take 1 capsule by mouth daily.    . brimonidine (ALPHAGAN) 0.2 % ophthalmic solution Place into both eyes 2 (two) times daily.    . cetirizine (ZYRTEC) 10 MG tablet Take 10 mg by mouth daily.    . cholecalciferol (VITAMIN D) 1000 units tablet Take 1,000 Units by mouth daily.    . Coenzyme Q10 (COQ10 PO) Take 300 mg by mouth daily.    . diclofenac sodium (VOLTAREN) 1 % GEL Apply 2 g topically 4 (four) times daily. 100 g 0  . dorzolamide-timolol (COSOPT) 22.3-6.8 MG/ML ophthalmic solution Place 1 drop into both eyes 2 (two) times daily.    Marland Kitchen latanoprost (XALATAN) 0.005 % ophthalmic solution Place 1 drop into both eyes at bedtime.    . Multiple Vitamin (MULTIVITAMIN) tablet Take 1 tablet by mouth daily.    . nabumetone (RELAFEN)  500 MG tablet Take 500-1,000 mg by mouth daily.    . Olopatadine HCl (PAZEO) 0.7 % SOLN Apply 1 drop to eye daily.    . Omega-3 Fatty Acids (FISH OIL) 1000 MG CAPS Take 1,000 mg by mouth daily.    Marland Kitchen omeprazole (PRILOSEC) 20 MG capsule Take 20 mg by mouth daily.    Marland Kitchen  Rivaroxaban 15 & 20 MG TBPK Take 1 tablet by mouth daily. Take as directed on package: Start with one 15mg  tablet by mouth twice a day with food. On Day 22, switch to one 20mg  tablet once a day with food. 51 each 0  . trolamine salicylate (ASPERCREME) 10 % cream Apply 1 application topically as needed for muscle pain.     No current facility-administered medications on file prior to visit.     BP 126/76   Pulse (!) 56   Temp 98.1 F (36.7 C) (Oral)   Ht 5' 6.5" (1.689 m)   Wt 204 lb 8 oz (92.8 kg)   SpO2 98%   BMI 32.51 kg/m    Objective:   Physical Exam  Constitutional: She is oriented to person, place, and time. She appears well-nourished.  Neck: Neck supple.  Cardiovascular: Normal rate and regular rhythm.  Respiratory: Effort normal and breath sounds normal.  Musculoskeletal:       Left knee: She exhibits decreased range of motion and swelling. No tenderness found.  Very minimal swelling to left lateral patella. Non tender  Neurological: She is alert and oriented to person, place, and time.  Skin: Skin is warm and dry. No erythema.  Psychiatric: She has a normal mood and affect.           Assessment & Plan:

## 2017-12-23 NOTE — Assessment & Plan Note (Signed)
Stable in the office today, continue Lotrel.  BMP reviewed from care everywhere.

## 2017-12-31 ENCOUNTER — Other Ambulatory Visit (RURAL_HEALTH_CENTER): Payer: Self-pay | Admitting: Family Medicine

## 2017-12-31 DIAGNOSIS — S8012XA Contusion of left lower leg, initial encounter: Secondary | ICD-10-CM

## 2018-01-15 ENCOUNTER — Ambulatory Visit: Payer: Medicare Other | Attending: Family Medicine | Admitting: Family Medicine

## 2018-01-15 ENCOUNTER — Other Ambulatory Visit
Admission: RE | Admit: 2018-01-15 | Discharge: 2018-01-15 | Disposition: A | Discharge status: Home / Self Care | Payer: BC Managed Care – PPO | Source: Ambulatory Visit | Attending: Family Medicine | Admitting: Family Medicine

## 2018-01-15 ENCOUNTER — Encounter (RURAL_HEALTH_CENTER): Payer: Self-pay | Admitting: Family Medicine

## 2018-01-15 VITALS — BP 121/77 | HR 79 | Temp 98.6°F | Resp 18 | Wt 206.0 lb

## 2018-01-15 DIAGNOSIS — E782 Mixed hyperlipidemia: Secondary | ICD-10-CM

## 2018-01-15 DIAGNOSIS — R7303 Prediabetes: Secondary | ICD-10-CM

## 2018-01-15 DIAGNOSIS — M1712 Unilateral primary osteoarthritis, left knee: Secondary | ICD-10-CM

## 2018-01-15 DIAGNOSIS — S8012XA Contusion of left lower leg, initial encounter: Secondary | ICD-10-CM

## 2018-01-15 DIAGNOSIS — I10 Essential (primary) hypertension: Secondary | ICD-10-CM

## 2018-01-15 DIAGNOSIS — M8580 Other specified disorders of bone density and structure, unspecified site: Secondary | ICD-10-CM

## 2018-01-15 HISTORY — DX: Mixed hyperlipidemia: E78.2

## 2018-01-15 HISTORY — DX: Unilateral primary osteoarthritis, left knee: M17.12

## 2018-01-15 LAB — CBC AND DIFFERENTIAL
Basophils %: 1.4 % (ref 0.0–3.0)
Basophils Absolute: 0.1 10*3/uL (ref 0.0–0.3)
Eosinophils %: 2.5 % (ref 0.0–7.0)
Eosinophils Absolute: 0.1 10*3/uL (ref 0.0–0.8)
Hematocrit: 42.9 % (ref 36.0–48.0)
Hemoglobin: 13.8 gm/dL (ref 12.0–16.0)
Lymphocytes Absolute: 1.7 10*3/uL (ref 0.6–5.1)
Lymphocytes: 31.9 % (ref 15.0–46.0)
MCH: 32 pg (ref 28–35)
MCHC: 32 gm/dL (ref 32–36)
MCV: 99 fL (ref 80–100)
MPV: 8.9 fL (ref 6.0–10.0)
Monocytes Absolute: 0.4 10*3/uL (ref 0.1–1.7)
Monocytes: 7.1 % (ref 3.0–15.0)
Neutrophils %: 57.1 % (ref 42.0–78.0)
Neutrophils Absolute: 3.1 10*3/uL (ref 1.7–8.6)
PLT CT: 298 10*3/uL (ref 130–440)
RBC: 4.32 10*6/uL (ref 3.80–5.00)
RDW: 11.7 % (ref 11.0–14.0)
WBC: 5.4 10*3/uL (ref 4.0–11.0)

## 2018-01-15 LAB — LIPID PANEL
Cholesterol: 228 mg/dL — ABNORMAL HIGH (ref 75–199)
Coronary Heart Disease Risk: 2.96
HDL: 77 mg/dL — ABNORMAL HIGH (ref 45–65)
LDL Calculated: 120 mg/dL
Triglycerides: 155 mg/dL — ABNORMAL HIGH (ref 10–150)
VLDL: 31 (ref 0–40)

## 2018-01-15 LAB — COMPREHENSIVE METABOLIC PANEL
ALT: 20 U/L (ref 0–55)
AST (SGOT): 21 U/L (ref 10–42)
Albumin/Globulin Ratio: 1.29 Ratio (ref 0.80–2.00)
Albumin: 4.4 gm/dL (ref 3.5–5.0)
Alkaline Phosphatase: 76 U/L (ref 40–145)
Anion Gap: 13.4 mMol/L (ref 7.0–18.0)
BUN / Creatinine Ratio: 14.1 Ratio (ref 10.0–30.0)
BUN: 11 mg/dL (ref 7–22)
Bilirubin, Total: 0.7 mg/dL (ref 0.1–1.2)
CO2: 27 mMol/L (ref 20–30)
Calcium: 10.3 mg/dL (ref 8.5–10.5)
Chloride: 104 mMol/L (ref 98–110)
Creatinine: 0.78 mg/dL (ref 0.60–1.20)
EGFR: 77 mL/min/{1.73_m2} (ref 60–150)
Globulin: 3.4 gm/dL (ref 2.0–4.0)
Glucose: 105 mg/dL — ABNORMAL HIGH (ref 71–99)
Osmolality Calc: 279 mOsm/kg (ref 275–300)
Potassium: 4.4 mMol/L (ref 3.5–5.3)
Protein, Total: 7.8 gm/dL (ref 6.0–8.3)
Sodium: 140 mMol/L (ref 136–147)

## 2018-01-15 LAB — HEMOGLOBIN A1C: Hgb A1C, %: 5.5 %

## 2018-01-15 LAB — T3, FREE: T3, Free: 2.8 pg/mL (ref 1.71–3.71)

## 2018-01-15 LAB — T4, FREE: T4 Free: 0.96 ng/dL (ref 0.70–1.48)

## 2018-01-15 LAB — TSH: TSH: 2.07 u[IU]/mL (ref 0.40–4.20)

## 2018-01-15 MED ORDER — DICLOFENAC SODIUM 1 % TD GEL
2.00 g | Freq: Four times a day (QID) | TRANSDERMAL | 2 refills | Status: AC
Start: 2018-01-15 — End: ?

## 2018-01-15 MED ORDER — SEMAGLUTIDE(0.25 OR 0.5MG/DOS) 2 MG/1.5ML SC SOPN
0.50 mg | PEN_INJECTOR | SUBCUTANEOUS | 11 refills | Status: AC
Start: 2018-01-15 — End: ?

## 2018-01-15 MED ORDER — NABUMETONE 500 MG PO TABS
1000.00 mg | ORAL_TABLET | Freq: Every day | ORAL | 1 refills | Status: DC
Start: 2018-01-15 — End: 2018-08-03

## 2018-01-15 NOTE — Progress Notes (Signed)
[facility]  [facility]    History of Presenting Illness:     Chief Complaint   Patient presents with   . Follow-up     3 month       Brittany Berry is a 72 y.o. female who presents to the office for medical management.  Patient Active Problem List   Diagnosis   . Unspecified glaucoma(365.9)   . Essential hypertension, benign   . Restless legs syndrome (RLS)   . Graves disease   . Osteoarthritis   . GERD (gastroesophageal reflux disease)   . Osteopenia   . Pre-diabetes   . Nondisp fx of proximal phalanx of left little finger with routine heal   . Chronic right-sided low back pain without sciatica   . Mild cognitive impairment with memory loss   . Mixed hyperlipidemia   . Primary osteoarthritis of left knee     She returns today doing fairly well although her left knee continues to bother her. I saw her not long ago and gave her a Depo-Medrol injection in the knee. Unfortunately did not help with her pain. She has physical therapy ordered and is scheduled to have her first visit in approximately 5 days. She continues to struggle with going up and down steps and getting up out of chairs due to her knee discomfort seems to be bothering her modestly. She stopped taking tramadol and is now using nabumetone. She does request refills of diclofenac gel and nabumetone.  She had a DVT in April. She is on Xarelto. I recommended she stay on this medication at least for 6 months. There is no untoward bleeding.  She has lost a couple pounds. She is interested in weight loss and preventing diabetes.    Review of Systems:   ROS  In addition to those noted in history of present illness all other review of systems performed and were negative.    Past Medical History:     Past Medical History:   Diagnosis Date   . Arthritis    . Chronic headache 03/30/2013   . Chronic right-sided low back pain without sciatica 02/12/2016   . Cyst of breast    . Depression    . Diabetes mellitus      [facility]- NO MEDS REQUIRED AT THIS TIME.   Marland Kitchen Dyslipidemia 03/30/2013   . Essential hypertension, benign    . GERD (gastroesophageal reflux disease) 03/30/2013   . Graves disease 03/30/2013   . High cholesterol    . Mild cognitive impairment with memory loss 10/30/2016   . Mixed hyperlipidemia    . Mixed hyperlipidemia 01/15/2018   . Osteoarthritis 03/30/2013   . Osteopenia 03/30/2013   . Pre-diabetes 02/23/2014   . Primary osteoarthritis of left knee 01/15/2018   . Restless legs syndrome (RLS)    . Tendonitis    . 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 Unspecified glaucoma(365.9)        Past Surgical History:     Past Surgical History:   Procedure Laterality Date   . BREAST CYST ASPIRATION     . CATARACT EXTRACTION W/ INTRAOCULAR LENS IMPLANT  08/2013    right eye   . COLONOSCOPY  02/11/2013    Procedure: COLONOSCOPY;  Surgeon: Dineen Kid, MD;  Location: [facility];  Service: Gastroenterology;  Laterality: N/A;   . COLONOSCOPY  08/14    - colonoscopy   .  DILATION AND CURETTAGE OF UTERUS     . EXCISION, BONE SPUR      left little toe    . EXTRACTION, CATARACT, PHACO, IOL Left 08/07/2016    Procedure: EXTRACTION, CATARACT, PHACO, IOL;  Surgeon: Su Grand, MD;  Location: [facility];  Service: Ophthalmology;  Laterality: Left;       Family History:     Family History   Problem Relation Age of Onset   . Breast cancer Sister    . Cancer Other         BREAST CANCER.   . Colon polyps Other    . Anesthesia problems Other    . Heart disease Mother    . Heart attack Father    . Asthma Brother    . Heart disease Maternal Grandfather    . Heart disease Paternal Grandfather    . Cancer Sister    . Arthritis Sister    . COPD Sister    . Diabetes Sister    . Vasculitis Brother        Social History:     History   Smoking Status   . Former Smoker   . Quit date: 07/22/1982   Smokeless Tobacco   . Never Used     History   Alcohol Use   . Yes     Comment: wine 3/week     History    Drug Use No       Allergies:     Allergies   Allergen Reactions   . Codeine Nausea Only   . Prednisone      Causes depression       Medications:     Prior to Admission medications    Medication Sig Start Date End Date Taking? Authorizing Provider   amitriptyline (ELAVIL) 10 MG tablet Take 1 tablet (10 mg total) by mouth nightly as needed for Sleep. 05/26/17  Yes Marchia Meiers, MD   amLODIPine-benazepril (LOTREL 5-20) 5-20 MG per capsule TAKE ONE CAPSULE BY MOUTH EVERY DAY 05/12/17  Yes Marchia Meiers, MD   atorvastatin (LIPITOR) 20 MG tablet Take 1 tablet (20 mg total) by mouth daily. 08/19/17  Yes Marchia Meiers, MD   b complex vitamins capsule Take 1 capsule by mouth daily.   Yes [provider]   bimatoprost (LUMIGAN) 0.03 % ophthalmic drops Place 1 drop into both eyes nightly.   Yes [provider]   brimonidine (ALPHAGAN) 0.2 % ophthalmic solution Place 1 drop into both eyes 2 (two) times daily.   Yes [provider]   Cetirizine HCl (ZYRTEC PO) Take 10 mg by mouth as needed.    Yes [provider]   Cholecalciferol (VITAMIN D) 1000 UNIT tablet Take 1,000 Units by mouth daily.   Yes [provider]   CO-ENZYME Q-10 PO Take 300 mg by mouth daily.   Yes [provider]   diclofenac sodium (VOLTAREN) 1 % Gel topical gel Apply 2 g topically 4 (four) times daily 01/15/18  Yes Marchia Meiers, MD   dorzolamide-timolol (COSOPT) 22.3-6.8 MG/ML ophthalmic solution Apply 1 drop to eye 2 (two) times daily.   Yes [provider]   FLUoxetine (PROZAC) 10 MG capsule TAKE ONE CAPSULE BY MOUTH EVERY DAY 03/21/17  Yes Marchia Meiers, MD   ipratropium (ATROVENT) 0.06 % nasal spray 2 sprays by Nasal route 3 (three) times daily as needed for Rhinitis. 11/07/14  Yes Marchia Meiers, MD   latanoprost (  XALATAN) 0.005 % ophthalmic solution  02/18/14  Yes [provider]   Mometasone Furoate (NASONEX NA) 50 mcg by Nasal route as  needed.   Yes [provider]   Multiple Vitamin (MULTIVITAMIN) tablet Take 1 tablet by mouth daily.   Yes [provider]   nabumetone (RELAFEN) 500 MG tablet Take 2 tablets (1,000 mg total) by mouth daily 01/15/18  Yes Marchia Meiers, MD   Olopatadine HCl (PAZEO) 0.7 % Solution Apply to eye.   Yes [provider]   Omega-3 Fatty Acids (FISH OIL BURP-LESS) 1000 MG Cap Take 1 capsule by mouth daily.   Yes [provider]   omeprazole (PRILOSEC) 20 MG capsule TAKE ONE CAPSULE BY MOUTH ONCE DAILY 11/25/16  Yes Marchia Meiers, MD   rivaroxaban (XARELTO) 20 MG Tab Take 1 tablet (20 mg total) by mouth daily with dinner 11/17/17  Yes Marchia Meiers, MD   diclofenac sodium (VOLTAREN) 1 % Gel topical gel Apply 2 g topically 4 (four) times daily 11/17/17 01/15/18 Yes Marchia Meiers, MD   nabumetone (RELAFEN) 500 MG tablet TAKE 2 TABLETS (1,000 MG TOTAL) BY MOUTH DAILY. 01/01/18 01/15/18 Yes Marchia Meiers, MD   Semaglutide 0.25 or 0.5 MG/DOSE Solution Pen-injector Inject 0.5 mg into the skin once a week 01/15/18   Marchia Meiers, MD       Physical Exam:     Vitals:    01/15/18 0903   BP: 121/77   Pulse: 79   Resp: 18   Temp: 98.6 F (37 C)   SpO2: 99%     Body mass index is 32.41 kg/m.    Physical Exam   Constitutional: She is oriented to person, place, and time. She appears well-developed. No distress.   HENT:   Head: Normocephalic and atraumatic.   Right Ear: External ear normal.   Left Ear: External ear normal.   Nose: Nose normal.   Mouth/Throat: Oropharynx is clear and moist. No oropharyngeal exudate.   Eyes: Pupils are equal, round, and reactive to light. Conjunctivae and EOM are normal. Right eye exhibits no discharge. Left eye exhibits no discharge. No scleral icterus.   Neck: Normal range of motion. Neck supple. No JVD present. No tracheal deviation present. No thyromegaly present.   Cardiovascular: Normal rate, regular rhythm, normal heart sounds and  intact distal pulses.  Exam reveals no gallop and no friction rub.    No murmur heard.  Pulmonary/Chest: Effort normal and breath sounds normal. No stridor. No respiratory distress. She has no wheezes. She has no rales. She exhibits no tenderness.   Abdominal: Soft. Bowel sounds are normal. She exhibits no distension and no mass. There is no tenderness. There is no rebound and no guarding.   Musculoskeletal: Normal range of motion. She exhibits tenderness. She exhibits no edema.   Patient does seem to limp somewhat on her left knee. She has some tenderness over the antero-medial knee distal to the joint line. There is mild swelling here as well. No joint effusion. She has definitive hypertrophic arthritic changes.   Lymphadenopathy:     She has no cervical adenopathy.   Neurological: She is alert and oriented to person, place, and time. She has normal reflexes. Coordination normal.   Skin: Skin is warm and dry. No rash noted. She is not diaphoretic. No erythema. No pallor.   Psychiatric: She has a normal mood and affect. Her behavior is normal. Judgment and thought content normal.   Nursing note  and vitals reviewed.      Assessment:     1. Essential hypertension, benign  CBC and differential    Comprehensive metabolic panel    Hemoglobin A1C    Lipid panel    T3, free    T4, free    TSH   2. Mixed hyperlipidemia  CBC and differential    Comprehensive metabolic panel    Hemoglobin A1C    Lipid panel    T3, free    T4, free    TSH   3. Pre-diabetes  CBC and differential    Comprehensive metabolic panel    Hemoglobin A1C    Lipid panel    T3, free    T4, free    TSH   4. Osteopenia, unspecified location  CBC and differential    Comprehensive metabolic panel    Hemoglobin A1C    Lipid panel    T3, free    T4, free    TSH   5. Primary osteoarthritis of left knee  CBC and differential    Comprehensive metabolic panel    Hemoglobin A1C    Lipid panel    T3, free    T4, free    TSH   6. Hematoma of left lower extremity,  initial encounter  nabumetone (RELAFEN) 500 MG tablet    CBC and differential    Comprehensive metabolic panel    Hemoglobin A1C    Lipid panel    T3, free    T4, free    TSH   7. Contusion of left calf, initial encounter  nabumetone (RELAFEN) 500 MG tablet    CBC and differential    Comprehensive metabolic panel    Hemoglobin A1C    Lipid panel    T3, free    T4, free    TSH       Plan:   1. Refill diclofenac cream.  2. Refill nabumetone 500 mg twice daily with food  3. Ozempic 0.5 mg subcutaneous weekly  4. Labs today fasting include CBC, CMP, hemoglobin A1c, lipid panel and thyroid function  5. We did discuss her immunizations. I have recommended a shingles vaccine. At some point in time she will be transferring her care to [facility] when she sells her home here in this area and moves.  6. Continue current medical therapy. Patient will proceed with physical therapy for her left knee as well.  7. Follow-up 3 months      Signed by Evangeline Gula M.D.    This note was completed using [employer], medical. Grammatical errors, random word insertions, pronoun errors and incomplete sentences are occasional consequences of this technology due to software limitations. If there are questions or concerns about the content of this note or information contained within the body of this dictation they should be addressed with the provider for clarification.

## 2018-01-19 ENCOUNTER — Ambulatory Visit: Payer: Medicare Other | Attending: Family Medicine

## 2018-01-19 DIAGNOSIS — M1712 Unilateral primary osteoarthritis, left knee: Secondary | ICD-10-CM | POA: Insufficient documentation

## 2018-01-19 DIAGNOSIS — M6281 Muscle weakness (generalized): Secondary | ICD-10-CM | POA: Insufficient documentation

## 2018-01-19 DIAGNOSIS — M25562 Pain in left knee: Secondary | ICD-10-CM | POA: Insufficient documentation

## 2018-01-19 DIAGNOSIS — R2689 Other abnormalities of gait and mobility: Secondary | ICD-10-CM | POA: Insufficient documentation

## 2018-01-26 DIAGNOSIS — H401133 Primary open-angle glaucoma, bilateral, severe stage: Secondary | ICD-10-CM | POA: Diagnosis not present

## 2018-01-28 DIAGNOSIS — M25562 Pain in left knee: Secondary | ICD-10-CM | POA: Diagnosis not present

## 2018-01-28 DIAGNOSIS — M6281 Muscle weakness (generalized): Secondary | ICD-10-CM | POA: Diagnosis not present

## 2018-01-28 DIAGNOSIS — R2689 Other abnormalities of gait and mobility: Secondary | ICD-10-CM | POA: Diagnosis not present

## 2018-01-28 DIAGNOSIS — M1712 Unilateral primary osteoarthritis, left knee: Secondary | ICD-10-CM | POA: Diagnosis not present

## 2018-01-30 DIAGNOSIS — M25562 Pain in left knee: Secondary | ICD-10-CM | POA: Diagnosis not present

## 2018-01-30 DIAGNOSIS — M1712 Unilateral primary osteoarthritis, left knee: Secondary | ICD-10-CM | POA: Diagnosis not present

## 2018-01-30 DIAGNOSIS — M6281 Muscle weakness (generalized): Secondary | ICD-10-CM | POA: Diagnosis not present

## 2018-01-30 DIAGNOSIS — R2689 Other abnormalities of gait and mobility: Secondary | ICD-10-CM | POA: Diagnosis not present

## 2018-02-03 DIAGNOSIS — M6281 Muscle weakness (generalized): Secondary | ICD-10-CM | POA: Diagnosis not present

## 2018-02-03 DIAGNOSIS — M25562 Pain in left knee: Secondary | ICD-10-CM | POA: Diagnosis not present

## 2018-02-03 DIAGNOSIS — M1712 Unilateral primary osteoarthritis, left knee: Secondary | ICD-10-CM | POA: Diagnosis not present

## 2018-02-03 DIAGNOSIS — R2689 Other abnormalities of gait and mobility: Secondary | ICD-10-CM | POA: Diagnosis not present

## 2018-02-09 DIAGNOSIS — R2689 Other abnormalities of gait and mobility: Secondary | ICD-10-CM | POA: Diagnosis not present

## 2018-02-09 DIAGNOSIS — M25562 Pain in left knee: Secondary | ICD-10-CM | POA: Diagnosis not present

## 2018-02-09 DIAGNOSIS — M1712 Unilateral primary osteoarthritis, left knee: Secondary | ICD-10-CM | POA: Diagnosis not present

## 2018-02-09 DIAGNOSIS — M6281 Muscle weakness (generalized): Secondary | ICD-10-CM | POA: Diagnosis not present

## 2018-02-13 DIAGNOSIS — M1712 Unilateral primary osteoarthritis, left knee: Secondary | ICD-10-CM | POA: Diagnosis not present

## 2018-02-13 DIAGNOSIS — R2689 Other abnormalities of gait and mobility: Secondary | ICD-10-CM | POA: Diagnosis not present

## 2018-02-13 DIAGNOSIS — M6281 Muscle weakness (generalized): Secondary | ICD-10-CM | POA: Diagnosis not present

## 2018-02-13 DIAGNOSIS — M25562 Pain in left knee: Secondary | ICD-10-CM | POA: Diagnosis not present

## 2018-02-16 DIAGNOSIS — R2689 Other abnormalities of gait and mobility: Secondary | ICD-10-CM | POA: Diagnosis not present

## 2018-02-16 DIAGNOSIS — M25562 Pain in left knee: Secondary | ICD-10-CM | POA: Diagnosis not present

## 2018-02-16 DIAGNOSIS — M1712 Unilateral primary osteoarthritis, left knee: Secondary | ICD-10-CM | POA: Diagnosis not present

## 2018-02-16 DIAGNOSIS — M6281 Muscle weakness (generalized): Secondary | ICD-10-CM | POA: Diagnosis not present

## 2018-02-19 ENCOUNTER — Ambulatory Visit: Payer: Medicare Other | Attending: Family Medicine

## 2018-02-19 DIAGNOSIS — M6281 Muscle weakness (generalized): Secondary | ICD-10-CM | POA: Insufficient documentation

## 2018-02-19 DIAGNOSIS — M25562 Pain in left knee: Secondary | ICD-10-CM | POA: Insufficient documentation

## 2018-02-19 DIAGNOSIS — R2689 Other abnormalities of gait and mobility: Secondary | ICD-10-CM | POA: Insufficient documentation

## 2018-02-19 DIAGNOSIS — M1712 Unilateral primary osteoarthritis, left knee: Secondary | ICD-10-CM | POA: Insufficient documentation

## 2018-02-20 DIAGNOSIS — M1712 Unilateral primary osteoarthritis, left knee: Secondary | ICD-10-CM | POA: Diagnosis not present

## 2018-02-20 DIAGNOSIS — R2689 Other abnormalities of gait and mobility: Secondary | ICD-10-CM | POA: Diagnosis not present

## 2018-02-20 DIAGNOSIS — M25562 Pain in left knee: Secondary | ICD-10-CM | POA: Diagnosis not present

## 2018-02-20 DIAGNOSIS — M6281 Muscle weakness (generalized): Secondary | ICD-10-CM | POA: Diagnosis not present

## 2018-03-05 ENCOUNTER — Telehealth (RURAL_HEALTH_CENTER): Payer: Self-pay

## 2018-03-05 NOTE — Telephone Encounter (Signed)
Dr. Hyacinth Meeker, Please advise and respond by to the pool...pkt

## 2018-03-05 NOTE — Telephone Encounter (Signed)
Patient is on the xarelto for another 2 months per Dr. Hyacinth Meeker and she has hit a gap in her insurance so now it will cost her $355 for this.  She called her insurance and spoke with someone in clinical and they suggested checking to see if she would be able to take the pradaxa.  Patient has enough xarelto for the weekend and knows that Dr. Hyacinth Meeker is out until Monday.

## 2018-03-09 ENCOUNTER — Other Ambulatory Visit (RURAL_HEALTH_CENTER): Payer: Self-pay

## 2018-03-09 DIAGNOSIS — M1712 Unilateral primary osteoarthritis, left knee: Secondary | ICD-10-CM | POA: Diagnosis not present

## 2018-03-09 DIAGNOSIS — M6281 Muscle weakness (generalized): Secondary | ICD-10-CM | POA: Diagnosis not present

## 2018-03-09 DIAGNOSIS — R2689 Other abnormalities of gait and mobility: Secondary | ICD-10-CM | POA: Diagnosis not present

## 2018-03-09 DIAGNOSIS — M25562 Pain in left knee: Secondary | ICD-10-CM | POA: Diagnosis not present

## 2018-03-09 MED ORDER — FLUOXETINE HCL 10 MG PO CAPS
10.00 mg | ORAL_CAPSULE | Freq: Every day | ORAL | 3 refills | Status: DC
Start: 2018-03-09 — End: 2019-03-08

## 2018-03-11 MED ORDER — DABIGATRAN ETEXILATE MESYLATE 150 MG PO CAPS
150.00 mg | ORAL_CAPSULE | Freq: Two times a day (BID) | ORAL | 3 refills | Status: DC
Start: 2018-03-11 — End: 2018-08-03

## 2018-03-11 NOTE — Telephone Encounter (Signed)
Patient is to discontinue Xarelto and start immediately on Pradaxa 150 mg twice daily.

## 2018-03-11 NOTE — Addendum Note (Signed)
Addended by: Marchia Meiers on: 03/11/2018 04:05 PM     Modules accepted: Orders

## 2018-03-11 NOTE — Telephone Encounter (Signed)
L/M on voicemail .//bc

## 2018-03-12 DIAGNOSIS — R2689 Other abnormalities of gait and mobility: Secondary | ICD-10-CM | POA: Diagnosis not present

## 2018-03-12 DIAGNOSIS — M1712 Unilateral primary osteoarthritis, left knee: Secondary | ICD-10-CM | POA: Diagnosis not present

## 2018-03-12 DIAGNOSIS — M6281 Muscle weakness (generalized): Secondary | ICD-10-CM | POA: Diagnosis not present

## 2018-03-12 DIAGNOSIS — M25562 Pain in left knee: Secondary | ICD-10-CM | POA: Diagnosis not present

## 2018-03-15 ENCOUNTER — Other Ambulatory Visit (RURAL_HEALTH_CENTER): Payer: Self-pay | Admitting: Family Medicine

## 2018-03-23 ENCOUNTER — Ambulatory Visit: Payer: Medicare Other | Attending: Family Medicine

## 2018-03-23 DIAGNOSIS — R2689 Other abnormalities of gait and mobility: Secondary | ICD-10-CM | POA: Insufficient documentation

## 2018-03-23 DIAGNOSIS — M6281 Muscle weakness (generalized): Secondary | ICD-10-CM | POA: Insufficient documentation

## 2018-03-23 DIAGNOSIS — M25562 Pain in left knee: Secondary | ICD-10-CM | POA: Insufficient documentation

## 2018-03-23 DIAGNOSIS — M1712 Unilateral primary osteoarthritis, left knee: Secondary | ICD-10-CM | POA: Insufficient documentation

## 2018-04-22 DIAGNOSIS — Z23 Encounter for immunization: Secondary | ICD-10-CM | POA: Diagnosis not present

## 2018-05-06 ENCOUNTER — Other Ambulatory Visit: Payer: Self-pay | Admitting: Primary Care

## 2018-05-06 ENCOUNTER — Telehealth: Payer: Self-pay

## 2018-05-06 ENCOUNTER — Encounter: Payer: Self-pay | Admitting: Primary Care

## 2018-05-06 ENCOUNTER — Ambulatory Visit (INDEPENDENT_AMBULATORY_CARE_PROVIDER_SITE_OTHER): Payer: Medicare Other | Admitting: Primary Care

## 2018-05-06 ENCOUNTER — Ambulatory Visit (HOSPITAL_COMMUNITY)
Admission: RE | Admit: 2018-05-06 | Discharge: 2018-05-06 | Disposition: A | Discharge status: Home / Self Care | Payer: Medicare Other | Source: Ambulatory Visit | Attending: Primary Care | Admitting: Primary Care

## 2018-05-06 VITALS — BP 118/76 | HR 58 | Temp 98.0°F | Ht 66.5 in | Wt 206.5 lb

## 2018-05-06 DIAGNOSIS — M150 Primary generalized (osteo)arthritis: Secondary | ICD-10-CM | POA: Diagnosis not present

## 2018-05-06 DIAGNOSIS — I82532 Chronic embolism and thrombosis of left popliteal vein: Secondary | ICD-10-CM

## 2018-05-06 DIAGNOSIS — I82432 Acute embolism and thrombosis of left popliteal vein: Secondary | ICD-10-CM | POA: Diagnosis not present

## 2018-05-06 DIAGNOSIS — E0500 Thyrotoxicosis with diffuse goiter without thyrotoxic crisis or storm: Secondary | ICD-10-CM

## 2018-05-06 DIAGNOSIS — R7303 Prediabetes: Secondary | ICD-10-CM | POA: Diagnosis not present

## 2018-05-06 DIAGNOSIS — M159 Polyosteoarthritis, unspecified: Secondary | ICD-10-CM

## 2018-05-06 DIAGNOSIS — E2839 Other primary ovarian failure: Secondary | ICD-10-CM | POA: Diagnosis not present

## 2018-05-06 DIAGNOSIS — Z1239 Encounter for other screening for malignant neoplasm of breast: Secondary | ICD-10-CM | POA: Diagnosis not present

## 2018-05-06 DIAGNOSIS — Z1211 Encounter for screening for malignant neoplasm of colon: Secondary | ICD-10-CM | POA: Diagnosis not present

## 2018-05-06 DIAGNOSIS — I10 Essential (primary) hypertension: Secondary | ICD-10-CM

## 2018-05-06 DIAGNOSIS — E785 Hyperlipidemia, unspecified: Secondary | ICD-10-CM

## 2018-05-06 LAB — HEPATIC FUNCTION PANEL
ALK PHOS: 67 U/L (ref 39–117)
ALT: 13 U/L (ref 0–35)
AST: 15 U/L (ref 0–37)
Albumin: 4.3 g/dL (ref 3.5–5.2)
BILIRUBIN TOTAL: 0.4 mg/dL (ref 0.2–1.2)
Bilirubin, Direct: 0.1 mg/dL (ref 0.0–0.3)
TOTAL PROTEIN: 7.6 g/dL (ref 6.0–8.3)

## 2018-05-06 LAB — LIPID PANEL
CHOLESTEROL: 174 mg/dL (ref 0–200)
HDL: 60.7 mg/dL (ref >39.00)
LDL CALC: 91 mg/dL (ref 0–99)
NonHDL: 113.16
TRIGLYCERIDES: 111 mg/dL (ref 0.0–149.0)
Total CHOL/HDL Ratio: 3
VLDL: 22.2 mg/dL (ref 0.0–40.0)

## 2018-05-06 LAB — HEMOGLOBIN A1C: HEMOGLOBIN A1C: 5.5 % (ref 4.6–6.5)

## 2018-05-06 LAB — TSH: TSH: 1.95 u[IU]/mL (ref 0.35–4.50)

## 2018-05-06 MED ORDER — DICLOFENAC SODIUM 1 % TD GEL: 2.0000 g | Freq: Four times a day (QID) | TRANSDERMAL | 0 refills | Status: DC | PRN

## 2018-05-06 MED ORDER — RIVAROXABAN 20 MG PO TABS: 20.0000 mg | ORAL_TABLET | Freq: Every day | ORAL | 0 refills | Status: DC

## 2018-05-06 NOTE — Telephone Encounter (Signed)
[place] with vascular lab called preliminary report for LE venous; preliminary report is under encounters tab. Pt wants to know if needs to stay on Xarelto or what to do.Gentry Fitz NP said can finish taking Xarelto ( pt has 4 tabs left) ; no DVT. [place] voiced understanding and will relay to pt. FYI to Gentry Fitz NP.

## 2018-05-06 NOTE — Assessment & Plan Note (Signed)
Compliant to rivaroxaban and has four tablets remaining. Will have her complete the four remaining tablets then stop and resume aspirin. Repeat ultrasound pending.

## 2018-05-06 NOTE — Assessment & Plan Note (Signed)
Stable in the office today, continue current regimen. 

## 2018-05-06 NOTE — Progress Notes (Signed)
Left lower extremity venous duplex completed There is no evidence of an acute DVT noted. The previous DVT in the tibioperoneal trunck appears to be resolved. There is still evidence DVT noted in the tibial and peroneal veins with no propagation. There is no evidence of a Baker's cyst. [place] Selah Zelman,RVS 05/06/2018 1:41 PM

## 2018-05-06 NOTE — Patient Instructions (Addendum)
Call the [facility] to schedule your bone density scan and mammogram.  You will be contacted regarding your ultrasound of the leg.  Please let us know if you have not been contacted within one week.   Stop by the lab prior to leaving today. I will notify you of your results once received.   It was a pleasure to see you today!

## 2018-05-06 NOTE — Progress Notes (Signed)
Subjective:    Patient ID: Marie Ruiz, female    DOB: 01/05/1946, 72 y.o.   MRN: [id number]  HPI  Marie Ruiz is a 72 year old female who presents today to discuss coming off of rivaroxaban. She is also due for screening colonoscopy, mammogram, bone density.   She was initially placed on rivaroxaban in April 2019 due to left popliteal fossa DVT. She was told by her prior PCP to take rivaroxaban for 6 months total. She has four tablets remaninig in her current bottle. She is worried about the possibility of her DVT remaining as she initially had no symptoms for DVT when it was found. She initially presented with chronic left anterior knee pain.   She is due for labs including TSH and lipids.    She is requesting an extension to her handicap placard due to chronic left knee pain secondary to osteoarthritis. She has trouble walking long distances and standing on her knee for prolonged periods of time. She would like a refill of her diclofenac gel for which she uses 1-2 times daily on average.   Review of Systems  Respiratory: Negative for shortness of breath.   Cardiovascular: Negative for chest pain, palpitations and leg swelling.  Musculoskeletal: Positive for arthralgias.  Skin: Negative for color change.       Past Medical History:  Diagnosis Date  . Arthritis   . Bone spur    with excision  . Chickenpox   . DVT (deep venous thrombosis) ([place])   . Glaucoma   . Graves disease 03/30/2013  . Hyperlipemia   . Hypertension   . Osteopenia   . Prediabetes   . Restless leg syndrome      Social History   Socioeconomic History  . Marital status: Married    Spouse name: Not on file  . Number of children: Not on file  . Years of education: Not on file  . Highest education level: Not on file  Occupational History  . Not on file  Social Needs  . Financial resource strain: Not on file  . Food insecurity:    Worry: Not on file    Inability: Not on file  .  Transportation needs:    Medical: Not on file    Non-medical: Not on file  Tobacco Use  . Smoking status: Never Smoker  . Smokeless tobacco: Never Used  Substance and Sexual Activity  . Alcohol use: Yes  . Drug use: Never  . Sexual activity: Not on file  Lifestyle  . Physical activity:    Days per week: Not on file    Minutes per session: Not on file  . Stress: Not on file  Relationships  . Social connections:    Talks on phone: Not on file    Gets together: Not on file    Attends religious service: Not on file    Active member of club or organization: Not on file    Attends meetings of clubs or organizations: Not on file    Relationship status: Not on file  . Intimate partner violence:    Fear of current or ex partner: Not on file    Emotionally abused: Not on file    Physically abused: Not on file    Forced sexual activity: Not on file  Other Topics Concern  . Not on file  Social History Narrative   Lives in [place], moving to [place].   Retired.   Married.     Past Surgical  History:  Procedure Laterality Date  . BREAST CYST ASPIRATION    . CATARACT EXTRACTION W/ INTRAOCULAR LENS IMPLANT Right 2015  . CATARACT EXTRACTION W/PHACO Left 2017  . DILATION AND CURETTAGE OF UTERUS      Family History  Problem Relation Age of Onset  . Heart disease Mother   . Heart attack Father   . Breast cancer Sister     Allergies  Allergen Reactions  . Codeine   . Prednisone     Depression    Current Outpatient Medications on File Prior to Visit  Medication Sig Dispense Refill  . acetaminophen (TYLENOL) 500 MG tablet Take 500 mg by mouth as needed for mild pain.    Marland Kitchen amLODipine-benazepril (LOTREL) 5-20 MG capsule Take 1 capsule by mouth daily.    Marland Kitchen aspirin (ASPIRIN 81) 81 MG chewable tablet Chew 81 mg by mouth daily.    Marland Kitchen atorvastatin (LIPITOR) 20 MG tablet Take 20 mg by mouth daily.    Marland Kitchen b complex vitamins capsule Take 1 capsule by mouth daily.    . brimonidine (ALPHAGAN)  0.2 % ophthalmic solution Place into both eyes 2 (two) times daily.    . cetirizine (ZYRTEC) 10 MG tablet Take 10 mg by mouth daily.    . cholecalciferol (VITAMIN D) 1000 units tablet Take 1,000 Units by mouth daily.    . Coenzyme Q10 (COQ10 PO) Take 300 mg by mouth daily.    . diclofenac sodium (VOLTAREN) 1 % GEL Apply 2 g topically 4 (four) times daily. 100 g 0  . dorzolamide-timolol (COSOPT) 22.3-6.8 MG/ML ophthalmic solution Place 1 drop into both eyes 2 (two) times daily.    Marland Kitchen FLUoxetine (PROZAC) 10 MG capsule Take 1 capsule by mouth once daily for anxiety. 3 capsule 0  . latanoprost (XALATAN) 0.005 % ophthalmic solution Place 1 drop into both eyes at bedtime.    . Multiple Vitamin (MULTIVITAMIN) tablet Take 1 tablet by mouth daily.    . nabumetone (RELAFEN) 500 MG tablet Take 500-1,000 mg by mouth daily.    . Olopatadine HCl (PAZEO) 0.7 % SOLN Apply 1 drop to eye daily.    . Omega-3 Fatty Acids (FISH OIL) 1000 MG CAPS Take 1,000 mg by mouth daily.    Marland Kitchen omeprazole (PRILOSEC) 20 MG capsule Take 20 mg by mouth daily.    . Rivaroxaban 15 & 20 MG TBPK Take 1 tablet by mouth daily. Take as directed on package: Start with one 15mg  tablet by mouth twice a day with food. On Day 22, switch to one 20mg  tablet once a day with food. 51 each 0  . trolamine salicylate (ASPERCREME) 10 % cream Apply 1 application topically as needed for muscle pain.     No current facility-administered medications on file prior to visit.     BP 118/76   Pulse (!) 58   Temp 98 F (36.7 C) (Oral)   Ht 5' 6.5" (1.689 m)   Wt 206 lb 8 oz (93.7 kg)   SpO2 98%   BMI 32.83 kg/m    Objective:   Physical Exam  Constitutional: She appears well-nourished.  Neck: Neck supple.  Cardiovascular: Normal rate and regular rhythm.  Respiratory: Effort normal and breath sounds normal.  Musculoskeletal:       Left knee: She exhibits decreased range of motion. She exhibits no swelling and no bony tenderness. No tenderness found.   Skin: Skin is warm and dry. No erythema.  Psychiatric: She has a normal mood and affect.  Assessment & Plan:

## 2018-05-06 NOTE — Assessment & Plan Note (Signed)
Chronic, mainly to left anterior knee. Refill provided for diclofenac gel. Handicap placard provided.

## 2018-05-06 NOTE — Telephone Encounter (Signed)
Noted  

## 2018-05-06 NOTE — Assessment & Plan Note (Signed)
Repeat TSH pending

## 2018-05-06 NOTE — Assessment & Plan Note (Signed)
Repeat lipids pending today. Continue atorvastatin.

## 2018-05-07 ENCOUNTER — Encounter: Payer: Self-pay | Admitting: Gastroenterology

## 2018-05-08 ENCOUNTER — Telehealth (RURAL_HEALTH_CENTER): Payer: Self-pay | Admitting: Family Medicine

## 2018-05-08 ENCOUNTER — Other Ambulatory Visit: Payer: Self-pay | Admitting: Primary Care

## 2018-05-08 DIAGNOSIS — E2839 Other primary ovarian failure: Secondary | ICD-10-CM

## 2018-05-08 DIAGNOSIS — I82532 Chronic embolism and thrombosis of left popliteal vein: Secondary | ICD-10-CM

## 2018-05-08 NOTE — Telephone Encounter (Signed)
Patient calling in asking for last colonoscopy and last dexa scan dates.     In chart:     Colonoscopy- 02/11/2013  DXA - 02/16/16    Called the patient back and she did not answer. LM for patient to return call.

## 2018-05-09 NOTE — Telephone Encounter (Signed)
LMTCB KT

## 2018-05-11 NOTE — Telephone Encounter (Signed)
LMTCB KT

## 2018-05-12 NOTE — Telephone Encounter (Signed)
Left a message for the patient to call the office. [facility] LPN

## 2018-05-13 ENCOUNTER — Telehealth: Payer: Self-pay | Admitting: Hematology and Oncology

## 2018-05-13 ENCOUNTER — Encounter: Payer: Self-pay | Admitting: Hematology and Oncology

## 2018-05-13 NOTE — Telephone Encounter (Signed)
Pt has been scheduled to see Dr. Audelia Hives on 10/30 at 11am. Marie Ruiz has been made aware to arrive 30 minutes early in order to be checked in on time. Letter mailed.

## 2018-05-15 ENCOUNTER — Other Ambulatory Visit: Payer: Self-pay | Admitting: *Deleted

## 2018-05-15 MED ORDER — AMLODIPINE BESY-BENAZEPRIL HCL 5-20 MG PO CAPS: 1.0000 | ORAL_CAPSULE | Freq: Every day | ORAL | 3 refills | Status: DC

## 2018-05-16 ENCOUNTER — Other Ambulatory Visit (RURAL_HEALTH_CENTER): Payer: Self-pay | Admitting: Family Medicine

## 2018-05-16 DIAGNOSIS — I10 Essential (primary) hypertension: Secondary | ICD-10-CM

## 2018-05-20 ENCOUNTER — Encounter: Payer: Self-pay | Admitting: Hematology and Oncology

## 2018-05-20 ENCOUNTER — Inpatient Hospital Stay: Payer: Medicare Other | Attending: Hematology and Oncology | Admitting: Hematology and Oncology

## 2018-05-20 ENCOUNTER — Inpatient Hospital Stay: Payer: Medicare Other

## 2018-05-20 ENCOUNTER — Telehealth: Payer: Self-pay

## 2018-05-20 VITALS — BP 124/78 | HR 62 | Temp 98.0°F | Resp 18 | Ht 66.5 in | Wt 205.8 lb

## 2018-05-20 DIAGNOSIS — I82532 Chronic embolism and thrombosis of left popliteal vein: Secondary | ICD-10-CM | POA: Insufficient documentation

## 2018-05-20 DIAGNOSIS — Z86718 Personal history of other venous thrombosis and embolism: Secondary | ICD-10-CM

## 2018-05-20 DIAGNOSIS — D6859 Other primary thrombophilia: Secondary | ICD-10-CM

## 2018-05-20 DIAGNOSIS — Z7901 Long term (current) use of anticoagulants: Secondary | ICD-10-CM | POA: Diagnosis not present

## 2018-05-20 LAB — COMPREHENSIVE METABOLIC PANEL
ALT: 16 U/L (ref 0–44)
AST: 23 U/L (ref 15–41)
Albumin: 4.3 g/dL (ref 3.5–5.0)
Alkaline Phosphatase: 66 U/L (ref 38–126)
Anion gap: 8 (ref 5–15)
BUN: 9 mg/dL (ref 8–23)
CALCIUM: 9.9 mg/dL (ref 8.9–10.3)
CHLORIDE: 105 mmol/L (ref 98–111)
CO2: 28 mmol/L (ref 22–32)
CREATININE: 0.82 mg/dL (ref 0.44–1.00)
Glucose, Bld: 95 mg/dL (ref 70–99)
Potassium: 3.8 mmol/L (ref 3.5–5.1)
Sodium: 141 mmol/L (ref 135–145)
Total Bilirubin: 0.6 mg/dL (ref 0.3–1.2)
Total Protein: 8.3 g/dL — ABNORMAL HIGH (ref 6.5–8.1)

## 2018-05-20 LAB — CBC WITH DIFFERENTIAL (CANCER CENTER ONLY)
ABS IMMATURE GRANULOCYTES: 0.01 10*3/uL (ref 0.00–0.07)
BASOS PCT: 1 %
Basophils Absolute: 0.1 10*3/uL (ref 0.0–0.1)
EOS ABS: 0.3 10*3/uL (ref 0.0–0.5)
EOS PCT: 5 %
HCT: 43.4 % (ref 36.0–46.0)
Hemoglobin: 13.9 g/dL (ref 12.0–15.0)
Immature Granulocytes: 0 %
Lymphocytes Relative: 33 %
Lymphs Abs: 1.9 10*3/uL (ref 0.7–4.0)
MCH: 29.8 pg (ref 26.0–34.0)
MCHC: 32 g/dL (ref 30.0–36.0)
MCV: 93.1 fL (ref 80.0–100.0)
MONO ABS: 0.4 10*3/uL (ref 0.1–1.0)
Monocytes Relative: 7 %
NEUTROS ABS: 3.1 10*3/uL (ref 1.7–7.7)
Neutrophils Relative %: 54 %
PLATELETS: 309 10*3/uL (ref 150–400)
RBC: 4.66 MIL/uL (ref 3.87–5.11)
RDW: 12.4 % (ref 11.5–15.5)
WBC Count: 5.8 10*3/uL (ref 4.0–10.5)
nRBC: 0 % (ref 0.0–0.2)

## 2018-05-20 LAB — D-DIMER, QUANTITATIVE (NOT AT ARMC): D DIMER QUANT: 0.28 ug{FEU}/mL (ref 0.00–0.50)

## 2018-05-20 NOTE — Telephone Encounter (Signed)
Printed avs and calender of upcoming appointment. Per 10/30 los. Patient requested to return on 11/15

## 2018-05-20 NOTE — Progress Notes (Signed)
[facility] Initial Consultation  Patient Name:  Marie Ruiz" [place]  DOB: 05-Jul-1946   Date of Service: May 20, 2018  Referring Provider: Alma Friendly, CNP [place], [place] [id number]   Consulting Physician: Henreitta Leber, MD Hematology/Oncology  Reason for Referral: In the setting of a deep vein thrombosis in the left posterior tibial vein and tibioperoneal trunk; having been treated with rivaroxaban on an outpatient basis, she presents now to exclude an apparent unprovoked hereditary/acquired thrombophilia and determine how long she should be on anticoagulation.  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  Her primary care provider is Alma Friendly, nurse practitioner.  She is accompanied by her attentive husband Waunita Schooner.  On November 10, 2017 she presented to the emergency department at [facility] complaining of rather sudden onset of pain and swelling of the left lower extremity.  Both she and her husband had relocated in September and were driving back and forth from [place] to [place] and subsequently [place].  Although she was seen at [facility] on April 22, unable 21, she presented to [facility] where a Doppler study was unavailable.  She was given 1 dose of Xarelto and diclofenac gel with instructions to obtain a duplex venous Doppler the following morning.  A left lower extremity duplex venous Doppler was performed at [facility].  The results of that study revealed  within the left posterior tibial vein and tibioperitoneal trunk.  She had no chest pain, tightness, dyspnea, or tachycardia to suggest a pulmonary embolism.  No additional studies were performed.  She was discharged on rivaroxaban by protocol.  She has been followed thereafter by Alma Friendly, nurse practitioner. She is tolerated rivaroxaban without difficulty.  She denies any bleeding tendency.  Her overall energy level over the past several years is declined but remains stable.  She has no bleeding tendency.  On May 06, 2018 a repeat left lower extremity duplex venous Doppler was performed at [facility]. It was compared with the previous study from April 22.  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 These findings appear improved from the previous examination.  No cystic structure was found in the popliteal fossa.  Blood flow was identified and complete occlusion was not seen.  Those studies are detailed below.  Her appetite and weight remain stable.  She reports no new visual changes or hearing deficit.  She has no cough, sore throat, or orthopnea.  She denies dyspnea either at rest or on minimal exertion.  There is no pain or difficulty in swallowing.  No fever, shaking chills, sweats, or flulike symptoms are reported.  She has no heartburn or indigestion on medication.  She has occasional diarrhea and was previously diagnosed with microscopic colitis.  She admits to diarrhea once every 2 weeks.  She has melena or bright red blood per rectum.  No urinary frequency, urgency, hematuria, or dysuria reported.  There is no swelling of her ankles.  She has no new arthralgias or myalgias.  She has no numbness or tingling in her fingers or toes.  It is with this background she presents now for further discussion and laboratory evaluation to identify an underlying hereditary/acquired  thrombophilia.  Past Medical History:  Diagnosis Date  . Arthritis   . Bone spur    with excision  . Chickenpox   . DVT (deep venous thrombosis) ([place])   . Glaucoma   . Graves disease 03/30/2013  . Hyperlipemia   . Hypertension   . Osteopenia   . Prediabetes   . Restless leg syndrome    Past Surgical History:  Procedure Laterality Date  . BREAST CYST ASPIRATION    . CATARACT EXTRACTION W/ INTRAOCULAR LENS IMPLANT Right 2015  . CATARACT EXTRACTION W/PHACO Left 2017  . DILATION AND CURETTAGE OF UTERUS     Gynecologic History: Her menarche was at age 72 years. She is nulliparous. She reports no hormonal replacement therapy. In her 82s for 1 year she took oral contraception. Her last screening mammogram was in 2018 She has never had a breast biopsy. Her last Pap smear was in 2005  Family History  Problem Relation Age of Onset  . Heart disease Mother   . Heart attack Father   . Breast cancer Sister   Mother: Deceased: Age 69 years: CAD/COPD Father: Deceased: Age 3 years: MI Brothers (2): Deceased: "Natural causes" Sisters (7): Multiple myeloma/MVA/breast cancer/COPD/skin cancer  Social History   Socioeconomic History  . Marital status: Married    Spouse name: Not on file  . Number of children: Not on file  . Years of education: Not on file  . Highest education level: Not on file  Occupational History  . Not on file  Social Needs  . Financial resource strain: Not on file  . Food insecurity:    Worry: Not on file    Inability: Not on file  . Transportation needs:    Medical: Not on file    Non-medical: Not on file  Tobacco Use  . Smoking status: Former Smoker    Last attempt to quit: 1990    Years since quitting: 29.8  . Smokeless tobacco: Never Used  Substance and Sexual Activity  . Alcohol use: Yes  . Drug use: Never  . Sexual activity: Not on file  Lifestyle  . Physical activity:    Days per week: Not on file    Minutes per session: Not on file  .  Stress: Not on file  Relationships  . Social connections:    Talks on phone: Not on file    Gets together: Not on file    Attends religious service: Not on file    Active member of club or organization: Not on file    Attends meetings of clubs or organizations: Not on file    Relationship status: Not on file  . Intimate partner violence:    Fear of current or ex partner: Not on file    Emotionally abused: Not on file    Physically abused: Not on file    Forced sexual activity: Not on file  Other Topics Concern  . Not on file  Social History Narrative   Lives in [place], moving to [place].   Retired.   Married.   Hula is married for the past 22 years.   She was formally a Engineer, petroleum. She has no children. She began smoking at the age of 33 years. She smoked <1/2 pack of cigarettes daily. She stopped smoking at the age of 10 years. Her alcohol intake consists of 2 drinks weekly usually Barnabas Lister  Olena Heckle or wine. She reports no recreational drug use She has never used e-cigarettes.  Transfusion History: No prior transfusion  Exposure History: She has no known exposure to toxic chemicals, radiation, or pesticides  Allergies  Allergen Reactions  . Codeine   . Prednisone     Depression  She has no food allergies She has nonspecific seasonal allergies  Current Outpatient Medications on File Prior to Visit  Medication Sig  . acetaminophen (TYLENOL) 500 MG tablet Take 500 mg by mouth as needed for mild pain.  Marland Kitchen amLODipine-benazepril (LOTREL) 5-20 MG capsule Take 1 capsule by mouth daily.  Marland Kitchen aspirin (ASPIRIN 81) 81 MG chewable tablet Chew 81 mg by mouth daily.  Marland Kitchen atorvastatin (LIPITOR) 20 MG tablet Take 20 mg by mouth daily.  Marland Kitchen b complex vitamins capsule Take 1 capsule by mouth daily.  . brimonidine (ALPHAGAN) 0.2 % ophthalmic solution Place into both eyes 2 (two) times daily.  . cetirizine (ZYRTEC) 10 MG tablet Take 10 mg by mouth daily.  . cholecalciferol (VITAMIN D)  1000 units tablet Take 1,000 Units by mouth daily.  . Coenzyme Q10 (COQ10 PO) Take 300 mg by mouth daily.  . diclofenac sodium (VOLTAREN) 1 % GEL Apply 2 g topically 4 (four) times daily as needed.  . dorzolamide-timolol (COSOPT) 22.3-6.8 MG/ML ophthalmic solution Place 1 drop into both eyes 2 (two) times daily.  Marland Kitchen FLUoxetine (PROZAC) 10 MG capsule Take 1 capsule by mouth once daily for anxiety.  Marland Kitchen latanoprost (XALATAN) 0.005 % ophthalmic solution Place 1 drop into both eyes at bedtime.  . Multiple Vitamin (MULTIVITAMIN) tablet Take 1 tablet by mouth daily.  . nabumetone (RELAFEN) 500 MG tablet Take 500-1,000 mg by mouth daily.  . Olopatadine HCl (PAZEO) 0.7 % SOLN Apply 1 drop to eye daily.  . Omega-3 Fatty Acids (FISH OIL) 1000 MG CAPS Take 1,000 mg by mouth daily.  Marland Kitchen omeprazole (PRILOSEC) 20 MG capsule Take 20 mg by mouth daily.  . rivaroxaban (XARELTO) 20 MG TABS tablet Take 1 tablet (20 mg total) by mouth daily with supper.  . trolamine salicylate (ASPERCREME) 10 % cream Apply 1 application topically as needed for muscle pain.   No current facility-administered medications on file prior to visit.     Review of Systems: Constitutional: No fever, sweats, or shaking chills.  No appetite or weight deficit.  Energy level fair. Skin: No rash, scaling, sores, lumps, or jaundice. HEENT: No visual changes or hearing deficit; glaucoma. Pulmonary: No unusual cough, sore throat, or orthopnea; former smoker. Breasts: No complaints. Cardiovascular: No coronary artery disease, angina, or myocardial infarction.  No cardiac dysrhythmia. Essential hypertension and dyslipidemia. Gastrointestinal: No indigestion, dysphagia, abdominal pain, or constipation.  Chronic microscopic colitis.  No change in bowel habits.  GERD.  No nausea or vomiting.  No melena or bright red blood per rectum. Genitourinary: No urinary frequency, urgency, hematuria, or dysuria. Musculoskeletal: Degenerative joint disease involving  the left knee and right elbow.  No arthralgias or myalgias; no joint swelling, pain, or instability; osteoporosis; restless leg syndrome. Hematologic: No bleeding tendency or easy bruisability. Endocrine: No intolerance to heat or cold; Graves' disease; pre-diabetes. Vascular: No peripheral arterial or venous thromboembolic disease. Psychological: No anxiety, depression, or mood changes; sleep disturbance; no mental health illnesses. Neurological: No dizziness, lightheadedness, syncope, or near syncopal episodes; no numbness or tingling in the fingers or toes.  Physical Examination: Vital Signs: Body surface area is 2.09 meters squared.  Vitals:   05/20/18 1111  BP: 124/78  Pulse: 62  Resp: 18  Temp: 98 F (36.7 C)  SpO2: 100%    Filed Weights   05/20/18 1111  Weight: 205 lb 12.8 oz (93.4 kg)  ECOG PERFORMANCE STATUS: 0-1 Constitutional:  Crytal "Millie" Delavega is fully nourished and developed albeit overweight. She looks age appropriate. She is friendly and cooperative without respiratory compromise at rest. Skin: No rashes, scaling, dryness, jaundice, or itching. HEENT: Head is normocephalic and atraumatic.  Pupils are equal round and reactive to light and accommodation.  Sclerae are anicteric.  Conjunctivae are pink.  No sinus tenderness nor oropharyngeal lesions.  Lips without cracking or peeling; tongue without mass, inflammation, or nodularity.  Mucous membranes are moist. Neck: Supple and symmetric.  No jugular venous distention or thyromegaly.  Trachea is midline. Lymphatics: No cervical or supraclavicular lymphadenopathy.  No epitrochlear, axillary, or inguinal lymphadenopathy is appreciated. Respiratory/chest: Thorax is symmetrical.  Breath sounds are clear to auscultation and percussion.  Normal excursion and respiratory effort. Back: Symmetric without deformity or tenderness. Cardiovascular: Heart rate and rhythm are regular without murmurs, gallops, or  rubs. Gastrointestinal: Abdomen is soft, nontender; no organomegaly.  Bowel sounds are normoactive.  No masses are appreciated. Rectal examination: Not performed. Extremities: In the lower extremities, there is no asymmetric swelling, erythema, tenderness, or cord formation.  No clubbing, cyanosis, nor edema.  Legs, feet, and toes are symmetric without swelling or discoloration. Hematologic: No petechiae, hematomas, or ecchymoses. Psychological:  She is oriented to person, place, and time; normal affect, memory, and cognition. Neurological: There are no gross neurologic deficits.  Laboratory Results: I have reviewed the data as listed:  Ref Range & Units 13:37 87moago  WBC Count 4.0 - 10.5 K/uL 5.8  7.7   RBC 3.87 - 5.11 MIL/uL 4.66  4.48   Hemoglobin 12.0 - 15.0 g/dL 13.9  13.9   HCT 36.0 - 46.0 % 43.4  42.6   MCV 80.0 - 100.0 fL 93.1  95.1 R  MCH 26.0 - 34.0 pg 29.8  31.0   MCHC 30.0 - 36.0 g/dL 32.0  32.6   RDW 11.5 - 15.5 % 12.4  13.3   Platelet Count 150 - 400 K/uL 309  289 CM  nRBC 0.0 - 0.2 % 0.0    Neutrophils Relative % % 54    Neutro Abs 1.7 - 7.7 K/uL 3.1    Lymphocytes Relative % 33    Lymphs Abs 0.7 - 4.0 K/uL 1.9    Monocytes Relative % 7    Monocytes Absolute 0.1 - 1.0 K/uL 0.4    Eosinophils Relative % 5    Eosinophils Absolute 0.0 - 0.5 K/uL 0.3    Basophils Relative % 1    Basophils Absolute 0.0 - 0.1 K/uL 0.1    Immature Granulocytes % 0    Abs Immature Granulocytes 0.00 - 0.07 K/uL 0.01     CMP Latest Ref Rng & Units 05/20/2018 05/06/2018 11/09/2017  Glucose 70 - 99 mg/dL 95 - 101(H)  BUN 8 - 23 mg/dL 9 - 17  Creatinine 0.44 - 1.00 mg/dL 0.82 - 0.78  Sodium 135 - 145 mmol/L 141 - 139  Potassium 3.5 - 5.1 mmol/L 3.8 - 3.9  Chloride 98 - 111 mmol/L 105 - 104  CO2 22 - 32 mmol/L 28 - 25  Calcium 8.9 - 10.3 mg/dL 9.9 - 9.8  Total Protein 6.5 - 8.1 g/dL 8.3(H) 7.6 -  Total Bilirubin 0.3 - 1.2 mg/dL 0.6 0.4 -  Alkaline Phos 38 - 126  U/L 66 67 -  AST 15 - 41  U/L 23 15 -  ALT 0 - 44 U/L 16 13 -   Diagnostic/Imaging Studies: May 06, 2018 Lower Venous Study Indications: Follow up exam.  Examination Guidelines: A complete evaluation includes B-mode imaging, spectral Doppler, color Doppler, and power Doppler as needed of all accessible portions of each vessel. Bilateral testing is considered an integral part of a complete examination. Limited examinations for reoccurring indications may be performed as noted.  Left Technical Findings: DVT in the tibioperoneal vein appears resolved. DVT remains in one of the paired veins of the peronoal and posterior tibial veins with no propagation.  Summary: Right: There is no evidence of a common femoral vein obstruction. 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 Findings appear improved from previous examination. No cystic structure found in the popliteal fossa. See technical findings  above  Harold Barban MD on 05/06/2018 at 5:31:59 PM.  November 10, 2017 Lower Venous Study  Indication: Pain. Examination Guidelines: A complete evaluation includes B-mode imaging, spectral doppler, color doppler, and power doppler as needed of all accessible portions of each vessel. Bilateral testing is considered an integral part of a complete examination. Limited examinations for reoccurring indications may be performed as noted.  Right Venous Findings: Final Interpretation: Right: No evidence of common femoral vein obstruction. Left: There is evidence of acute DVT in the Posterior Tibial vein and tibioperoneal trunk.  Todd Early  11/10/2017 at 6:54:00 PM.  Summary/Assessment: In the setting of a deep vein thrombosis in the left posterior tibial vein and tibioperoneal trunk; having been treated initially with rivaroxaban on an outpatient basis, she presents now to exclude an apparent unprovoked hereditary/acquired  thrombophilia and determine how long she should be on anticoagulation.  On November 10, 2017 she presented to the emergency department at [facility] complaining of rather sudden onset of pain and swelling of the left lower extremity.  Both she and her husband had relocated in September and were driving back and forth from [place] to [place] and subsequently [place].  Although she was seen at [facility] on April 22, unable 21, she presented to [facility] where a Doppler study was unavailable.  She was given 1 dose of Xarelto and diclofenac gel with instructions to obtain a duplex venous Doppler the following morning.  A left lower extremity duplex venous Doppler was performed at [facility].  The results of that study revealed within the left posterior tibial vein and tibioperitoneal trunk.  She had no chest pain, tightness, dyspnea, or tachycardia to suggest a pulmonary embolism.  No additional studies were performed.  She was discharged on rivaroxaban by protocol.  She has been followed thereafter by Alma Friendly, nurse practitioner. She is tolerated rivaroxaban without difficulty.  She denies any bleeding tendency.  Her overall energy level over the past several years is declined but remains stable.  She has no bleeding tendency.    On May 06, 2018 a repeat left lower extremity duplex venous Doppler was performed at [facility].  It was compared with the previous study from April 22.  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 These findings appear improved from the previous examination.  No cystic structure was found in the popliteal fossa.  Blood flow was identified and complete occlusion was not seen.  Those studies are detailed below.  Her appetite and weight remain stable.  She reports no new visual changes or hearing deficit.  She has no cough,  sore throat, or orthopnea.  She denies dyspnea either at rest or on minimal exertion.  There is no pain or difficulty in swallowing.  No fever, shaking chills, sweats, or flulike symptoms are reported.  She has no heartburn or indigestion on medication.  She has occasional diarrhea and was previously diagnosed with microscopic colitis.  She admits to diarrhea once every 2 weeks.  She has melena or bright red blood per rectum.  No urinary frequency, urgency, hematuria, or dysuria reported.  There is no swelling of her ankles.  She has no new arthralgias or myalgias.  She has no numbness or tingling in her fingers or toes.    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    Recommendation/Plan: We discussed in detail her prior history, original and more recent ultrasound of the left lower extremity which shows a steady improvement.  It was explained to her that following an acute deep vein thrombosis, there is frequently organization and fibrosis of the clot which leaves some degree of "scarring."  She has no evidence of post phlebitic syndrome.  Her legs are symmetric without swelling, pain, or discoloration.  Laboratory studies were requested today to exclude an underlying hereditary/acquired thrombophilia.  The studies are designed to help determine the duration of her therapy.  There is some concern since she had no clear provocative factors contributing to her thrombosis. She did drive fairly long distances with her husband in the process of relocating from [place].  In addition she is overweight.  The results of her laboratory studies were not available at the time of discharge.  They will be discussed in detail the time of her  next visit.  It was recommended that she continue on rivaroxaban for the present.  Barring any unforeseen complications, her next scheduled doctor visit to discuss those results with recommendations is on June 04, 2018.  She was advised to call us in the interim should any new or untoward problems arise.  The total time spent discussing the previous imaging studies, role, rationale, physical examination, and importance of a hypercoagulable evaluation with recommendations was 60 minutes. At least 50% of that time was spent in discussion, reviewing outside records, counseling, and answering questions.  She was given ample time to have all of her questions answered.  This note was dictated using voice activated technology/software.  Unfortunately, typographical errors are not uncommon, and transcription is subject to mistakes and regrettably misinterpretation.  If necessary, clarification of the above information can be discussed with me at any time.  Thank you Belenda Cruise for allowing my participation in the care of Garber "Millie" Romanski. I will keep you closely informed as the results of her preliminary laboratory data become available.  Please do not hesitate to call should any questions arise regarding this initial consultation and discussion.  FOLLOW UP: AS DIRECTED   cc:        Alma Friendly, CNP   Henreitta Leber, MD  Hematology/Oncology [place] [address] [place], [place] [address] Office: [phone] KTGY: [phone]

## 2018-05-20 NOTE — Patient Instructions (Signed)
We discussed in detail your prior history with a left lower extremity deep vein thrombosis.  The ultrasound from October shows steady improvement.  Laboratory studies are requested today to exclude an underlying hereditary/acquired predisposition to thrombosis.  This will help in determining the duration of therapy.  For the time being, continue on Xarelto as previously ordered.  Barring any unforeseen complications, your next scheduled doctor visit to discuss the results of your lab work is on June 04, 2018.  Please do not hesitate to call should any questions arise in the interim.  Thank you! Ladona Ridgel, MD Hematology/Oncology

## 2018-05-21 LAB — CARDIOLIPIN ANTIBODIES, IGG, IGM, IGA
ANTICARDIOLIPIN IGM: 18 [MPL'U]/mL — AB (ref 0–12)
Anticardiolipin IgA: <9 APL U/mL (ref 0–11)
Anticardiolipin IgG: <9 GPL U/mL (ref 0–14)

## 2018-05-21 LAB — HOMOCYSTEINE: Homocysteine: 9.8 umol/L (ref 0.0–15.0)

## 2018-05-22 LAB — LUPUS ANTICOAGULANT
DPT CONFIRM RATIO: 0.91 ratio (ref 0.00–1.40)
DPT: 56.8 s — AB (ref 0.0–55.0)
DRVVT: 80.7 s — AB (ref 0.0–47.0)
PTT LA: 51.8 s (ref 0.0–51.9)
Thrombin Time: 17.9 s (ref 0.0–23.0)

## 2018-05-22 LAB — FACTOR 8 ASSAY: Coagulation Factor VIII: 68 % (ref 56–140)

## 2018-05-22 LAB — BETA-2-GLYCOPROTEIN I ABS, IGG/M/A
Beta-2 Glyco I IgG: <9 GPI IgG units (ref 0–20)
Beta-2-Glycoprotein I IgA: <9 GPI IgA units (ref 0–25)
Beta-2-Glycoprotein I IgM: <9 GPI IgM units (ref 0–32)

## 2018-05-22 LAB — DRVVT CONFIRM: dRVVT Confirm: 1.8 ratio — ABNORMAL HIGH (ref 0.8–1.2)

## 2018-05-22 LAB — DRVVT MIX: dRVVT Mix: 58.7 s — ABNORMAL HIGH (ref 0.0–47.0)

## 2018-05-26 LAB — PROTHROMBIN GENE MUTATION

## 2018-05-28 ENCOUNTER — Ambulatory Visit (RURAL_HEALTH_CENTER): Payer: BC Managed Care – PPO | Admitting: Family Medicine

## 2018-05-29 LAB — FACTOR 5 LEIDEN

## 2018-06-03 LAB — CARDIOLIPIN ANTIBODIES, IGG, IGM, IGA
Anticardiolipin IgA: <9 APL U/mL (ref 0–11)
Anticardiolipin IgG: <9 GPL U/mL (ref 0–14)
Anticardiolipin IgM: 18 MPL U/mL — ABNORMAL HIGH (ref 0–12)

## 2018-06-03 LAB — LUPUS ANTICOAGULANT
DRVVT: 80.7 s — AB (ref 0.0–47.0)
PTT LA: 51.8 s (ref 0.0–51.9)
Thrombin Time: 17.9 s (ref 0.0–23.0)
dPT Confirm Ratio: 0.91 Ratio (ref 0.00–1.40)
dPT: 56.8 s — ABNORMAL HIGH (ref 0.0–55.0)

## 2018-06-03 LAB — BETA-2-GLYCOPROTEIN I ABS, IGG/M/A
Beta-2 Glyco I IgG: <9 GPI IgG units (ref 0–20)
Beta-2-Glycoprotein I IgA: <9 GPI IgA units (ref 0–25)
Beta-2-Glycoprotein I IgM: <9 GPI IgM units (ref 0–32)

## 2018-06-03 LAB — PROTHROMBIN GENE MUTATION

## 2018-06-03 LAB — DRVVT MIX: dRVVT Mix: 58.7 s — ABNORMAL HIGH (ref 0.0–47.0)

## 2018-06-03 LAB — DRVVT CONFIRM: DRVVT CONFIRM: 1.8 ratio — AB (ref 0.8–1.2)

## 2018-06-03 LAB — FACTOR 8 ASSAY: COAGULATION FACTOR VIII: 68 % (ref 56–140)

## 2018-06-03 LAB — FACTOR 5 LEIDEN

## 2018-06-05 ENCOUNTER — Encounter: Payer: Self-pay | Admitting: Gastroenterology

## 2018-06-05 ENCOUNTER — Ambulatory Visit (INDEPENDENT_AMBULATORY_CARE_PROVIDER_SITE_OTHER): Payer: Medicare Other | Admitting: Gastroenterology

## 2018-06-05 ENCOUNTER — Encounter: Payer: Self-pay | Admitting: Hematology and Oncology

## 2018-06-05 ENCOUNTER — Telehealth: Payer: Self-pay

## 2018-06-05 ENCOUNTER — Inpatient Hospital Stay: Payer: Medicare Other | Attending: Hematology and Oncology | Admitting: Hematology and Oncology

## 2018-06-05 VITALS — BP 104/66 | HR 69 | Ht 66.75 in | Wt 205.1 lb

## 2018-06-05 VITALS — BP 128/75 | HR 70 | Temp 98.2°F | Resp 17 | Ht 66.5 in | Wt 206.1 lb

## 2018-06-05 DIAGNOSIS — Z7901 Long term (current) use of anticoagulants: Secondary | ICD-10-CM | POA: Diagnosis not present

## 2018-06-05 DIAGNOSIS — Z86718 Personal history of other venous thrombosis and embolism: Secondary | ICD-10-CM

## 2018-06-05 DIAGNOSIS — Z86010 Personal history of colonic polyps: Secondary | ICD-10-CM

## 2018-06-05 DIAGNOSIS — R197 Diarrhea, unspecified: Secondary | ICD-10-CM

## 2018-06-05 DIAGNOSIS — D6859 Other primary thrombophilia: Secondary | ICD-10-CM

## 2018-06-05 DIAGNOSIS — I82532 Chronic embolism and thrombosis of left popliteal vein: Secondary | ICD-10-CM

## 2018-06-05 NOTE — Telephone Encounter (Signed)
Printed avs and calender of upcoming appointment. Per 11/15 los Will schedule and call patient with new provider appointment for 2/5 around 10ish

## 2018-06-05 NOTE — Patient Instructions (Signed)
If you are age 72 or older, your body mass index should be between 23-30. Your Body mass index is 32.37 kg/m. If this is out of the aforementioned range listed, please consider follow up with your Primary Care Provider.  If you are age 6 or younger, your body mass index should be between 19-25. Your Body mass index is 32.37 kg/m. If this is out of the aformentioned range listed, please consider follow up with your Primary Care Provider.   You will be due for a recall colonoscopy in 08-2017. We will send you a reminder in the mail when it gets closer to that time. And discuss blood thinner clearance.  It was a pleasure to see you today!  Dr. Tarri Glenn

## 2018-06-05 NOTE — Patient Instructions (Signed)
We discussed in detail the results of your laboratory studies from October 30.  All of the gene mutation studies and factor assays that could be performed while on Xarelto were done.  The lupus anticoagulant was identified.  In addition, the antiphospholipid antibody profile was very mildly elevated.  Both of these tests should be repeated in 12 weeks.  They are not confirmatory that you have a hereditary predisposition to clot formation.  A follow-up appointment for lab work only on August 17, 2018 has been scheduled.  Barring any unforeseen complications, your next scheduled doctor visit to discuss those results he is on August 26, 2018

## 2018-06-05 NOTE — Progress Notes (Signed)
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  I consented the patient at the bedside today discussing the risks, benefits, and alternatives to endoscopic evaluation. In particular, we discussed the risks that include, but are not limited to, reaction to medication, cardiopulmonary compromise, bleeding requiring blood transfusion, aspiration resulting in pneumonia, perforation requiring surgery, and even death. We reviewed the risk of missed lesion including polyps or even cancer. The patient acknowledges these risks and asks that we proceed.   HPI: Marie Ruiz is a 72 y.o. female . 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    Diagnosed with microscopic colitis in 2014 at [facility], [place], [place]. Peptobismal recommended daily. She doesn't take it daily.   She has occasional diarrhea with loose stools occuring once every 2 weeks. Associated gas. She has no melena, hematochezia or bright red blood per rectum. No mucous. Weight is stable.    She reports a history of colon polyps and feels that she is over due for surveillance.  Recently started on Xarelto after a left popliteal fossa DVT. Hypercoaguable work-up is still underway. Xarelto is continued until the results are clear.   Past Medical History:    Diagnosis Date  . Anxiety   . Arthritis   . Blood clotting disorder ([place])   . Bone spur    with excision  . Chickenpox   . Colon polyps   . DVT (deep venous thrombosis) ([place])   . Glaucoma    both  . Graves disease 03/30/2013  . History of blood clots 10/2017  . Hyperlipemia   . Hypertension   . Osteopenia   . Prediabetes   . Restless leg syndrome     Past Surgical History:  Procedure Laterality Date  . BREAST CYST ASPIRATION    . CATARACT EXTRACTION W/ INTRAOCULAR LENS IMPLANT Right 2015  . CATARACT EXTRACTION W/PHACO Left 2017  . COLONOSCOPY  2014  . DILATION AND CURETTAGE OF UTERUS      Current Outpatient Medications  Medication Sig Dispense Refill  . acetaminophen (TYLENOL) 500 MG tablet Take 500 mg by mouth as needed for mild pain.    Marland Kitchen amLODipine-benazepril (LOTREL) 5-20 MG capsule Take 1 capsule by mouth daily. 30 capsule 3  . atorvastatin (LIPITOR) 20 MG tablet Take 20 mg by mouth daily.    Marland Kitchen b complex vitamins capsule Take 1 capsule by mouth daily.    . brimonidine (ALPHAGAN) 0.2 % ophthalmic solution Place into both eyes 2 (two) times daily.    . cetirizine (ZYRTEC) 10 MG tablet Take 10 mg by mouth daily.    . cholecalciferol (VITAMIN D) 1000 units tablet Take 1,000 Units by mouth daily.    . Coenzyme Q10 (COQ10 PO) Take 300 mg by mouth daily.    . diclofenac sodium (VOLTAREN) 1 % GEL Apply 2 g topically 4 (four) times daily as needed. 100 g 0  . dorzolamide-timolol (COSOPT) 22.3-6.8 MG/ML ophthalmic solution Place 1 drop  into both eyes 2 (two) times daily.    Marland Kitchen FLUoxetine (PROZAC) 10 MG capsule Take 1 capsule by mouth once daily for anxiety. 3 capsule 0  . latanoprost (XALATAN) 0.005 % ophthalmic solution Place 1 drop into both eyes at bedtime.    . Multiple Vitamin (MULTIVITAMIN) tablet Take 1 tablet by mouth daily.    . nabumetone (RELAFEN) 500 MG tablet Take 500-1,000 mg by mouth as needed.     . Olopatadine HCl (PAZEO) 0.7 % SOLN Apply 1 drop to eye daily.     . Omega-3 Fatty Acids (FISH OIL) 1000 MG CAPS Take 1,000 mg by mouth daily.    Marland Kitchen omeprazole (PRILOSEC) 20 MG capsule Take 20 mg by mouth daily.    . rivaroxaban (XARELTO) 20 MG TABS tablet Take 1 tablet (20 mg total) by mouth daily with supper. 30 tablet 0  . trolamine salicylate (ASPERCREME) 10 % cream Apply 1 application topically as needed for muscle pain.     No current facility-administered medications for this visit.     Allergies as of 06/05/2018 - Review Complete 06/05/2018  Allergen Reaction Noted  . Codeine  11/09/2017  . Prednisone  12/23/2017    Family History  Problem Relation Age of Onset  . Heart disease Mother   . Heart attack Father   . Breast cancer Sister   . Colon polyps Sister   . Aneurysm Brother   . Multiple myeloma Sister        found in stage 4  . Esophageal cancer Neg Hx   . Colon cancer Neg Hx     Social History   Socioeconomic History  . Marital status: Married    Spouse name: Not on file  . Number of children: Not on file  . Years of education: Not on file  . Highest education level: Not on file  Occupational History  . Occupation: Retired  Scientific laboratory technician  . Financial resource strain: Not on file  . Food insecurity:    Worry: Not on file    Inability: Not on file  . Transportation needs:    Medical: Not on file    Non-medical: Not on file  Tobacco Use  . Smoking status: Former Smoker    Last attempt to quit: 1990    Years since quitting: 29.8  . Smokeless tobacco: Never Used  Substance and Sexual Activity  . Alcohol use: Yes  . Drug use: Never  . Sexual activity: Not on file  Lifestyle  . Physical activity:    Days per week: Not on file    Minutes per session: Not on file  . Stress: Not on file  Relationships  . Social connections:    Talks on phone: Not on file    Gets together: Not on file    Attends religious service: Not on file    Active member of club or organization: Not on file    Attends meetings of clubs or  organizations: Not on file    Relationship status: Not on file  . Intimate partner violence:    Fear of current or ex partner: Not on file    Emotionally abused: Not on file    Physically abused: Not on file    Forced sexual activity: Not on file  Other Topics Concern  . Not on file  Social History Narrative   Lives in [place], moving to [place].   Retired.   Married.     Review of Systems: 12 system ROS is  negative except as noted above with left knee arthritis and right elbow requiring steroid injections 2019.  Filed Weights   06/05/18 0948  Weight: 205 lb 2 oz (93 kg)    Physical Exam: Vital signs were reviewed. General:   Alert, well-nourished, pleasant and cooperative in NAD Head:  Normocephalic and atraumatic. Eyes:  Sclera clear, no icterus.   Conjunctiva pink. Mouth:  No deformity or lesions.   Neck:  Supple; no thyromegaly. Lungs:  Clear throughout to auscultation.   No wheezes.  Heart:  Regular rate and rhythm; no murmurs Abdomen:  Soft, nontender, normal bowel sounds. No rebound or guarding. No hepatosplenomegaly Rectal:  Deferred  Msk:  Symmetrical without gross deformities. Extremities:  No gross deformities or edema. Neurologic:  Alert and  oriented x4;  grossly nonfocal Skin:  No rash or bruise. Psych:  Alert and cooperative. Normal mood and affect.   Blonnie Maske L. Tarri Glenn Md, MPH [facility] 06/05/2018, 10:24 AM

## 2018-06-05 NOTE — Telephone Encounter (Signed)
Dr. Audelia Hives,   Marie Ruiz is wanting to schedule a colonoscopy for history of colon polyps. She is a new Xarelto pt and we would like to find out when she can come off her medication for a colonoscopy. Please advise.  Thank you.  [facility] Pre-operative Risk Assessment     Request for surgical clearance:     Endoscopy Procedure  What type of surgery is being performed?     Colononscopy  When is this surgery scheduled?     PENDING  What type of clearance is required ?   Pharmacy/ medication   Are there any medications that need to be held prior to surgery and how long? Xarelto  Practice name and name of physician performing surgery?      [facility]  What is your office phone and fax number?      Phone- [phone]  [phone]  Anesthesia type (None, local, MAC, general) ?       MAC

## 2018-06-07 NOTE — Progress Notes (Signed)
Hematology/Oncology Outpatient Progress Note  Patient Name:  Marie Ruiz  DOB: 1946-02-08   Date of Service: June 05, 2018  Referring Provider: Alma Friendly, CNP [place], [place] [address]   Consulting Physician: Henreitta Leber, MD Hematology/Oncology  Reason for Referral: In the setting of a deep vein thrombosis in the left posterior tibial vein and tibioperoneal trunk; having been treated with rivaroxaban on an outpatient basis, she presents now to exclude an apparent unprovoked hereditary/acquired thrombophilia and determine how long she should be on anticoagulation.  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  Her primary care provider is Alma Friendly, nurse practitioner.  She is accompanied by her attentive husband Waunita Schooner.  On November 10, 2017 she presented to the emergency department at [facility] complaining of rather sudden onset of pain and swelling of the left lower extremity.  Both she and her husband had relocated in September and were driving back and forth from [place] to [place] and subsequently [place].  Although she was seen at [facility] on April 22, unable 21, she presented to [facility] where a Doppler study was unavailable.  She was given 1 dose of Xarelto and diclofenac gel with instructions to obtain a duplex venous Doppler the following morning.  A left lower extremity duplex venous Doppler was performed at [facility]. The results of that study revealed within the left posterior tibial  vein and tibioperitoneal trunk.  She had no chest pain, tightness, dyspnea, or tachycardia to suggest a pulmonary embolism.  No additional studies were performed.  She was discharged on rivaroxaban by protocol. She has been followed thereafter by Alma Friendly, nurse practitioner. She is tolerated rivaroxaban without difficulty. She denies any bleeding tendency.  Her overall energy level over the past several years is declined but remains stable.  She has no bleeding tendency.  On May 06, 2018 a repeat left lower extremity duplex venous Doppler was performed at [facility]. It was compared with the previous study from April 22.  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 These findings appear improved from the previous examination.  No cystic structure was found in the popliteal fossa.  Blood flow was identified and complete occlusion was not seen. Those studies are detailed below.  At the time of her initial visit, we discussed in detail her prior history, original and more recent ultrasound of the left lower extremity which shows a steady improvement.  It was explained to her that following an acute deep vein thrombosis, there is frequently organization and fibrosis of the clot which leaves some degree of "scarring."  She has no evidence of post phlebitic syndrome.  Her legs are symmetric without swelling, pain, or discoloration.  Laboratory studies were requested today to exclude an underlying hereditary/acquired thrombophilia.  The studies are designed to help determine the duration of her therapy. Those results are detailed below. There was some concern since she had no clear provocative factors contributing to her thrombosis. She did drive fairly long distances with her husband in the process of relocating from [place]. In addition she is overweight.  It is with this background she  presents now for further discussion and the results of her initial laboratory studies to identify an underlying hereditary/acquired thrombophilia.  Interval History: In interim since her last visit, she reports no new problems or complaints. Her appetite and weight remain stable.  She reports no new visual changes or hearing deficit. She reports no headache, dizziness, lightheadedness, syncope, or no syncopal episodes. She has no rash or itching. She has no cough, sore throat, or orthopnea. She denies dyspnea either at rest or on minimal exertion. There is no pain or difficulty in swallowing.  No fever, shaking chills, sweats, or flulike symptoms are reported.  She has no heartburn or indigestion on medication. She has occasional diarrhea and was previously diagnosed with "microscopic colitis."  She admits to diarrhea once every 2 weeks.  She has no melena or bright red blood per rectum.  No urinary frequency, urgency, hematuria, or dysuria are reported. There is no swelling of her ankles.  She has no new arthralgias or myalgias. She has no numbness or tingling in her fingers or toes.    Past Medical History:  Diagnosis Date  . Anxiety   . Arthritis   . Blood clotting disorder ([place])   . Bone spur    with excision  . Chickenpox   . Colon polyps   . DVT (deep venous thrombosis) ([place])   . Glaucoma    both  . Graves disease 03/30/2013  . History of blood clots 10/2017  . Hyperlipemia   . Hypertension   . Osteopenia   . Prediabetes   . Restless leg syndrome    Past Surgical History:  Procedure Laterality Date  . BREAST CYST ASPIRATION    . CATARACT EXTRACTION W/ INTRAOCULAR LENS IMPLANT Right 2015  . CATARACT EXTRACTION W/PHACO Left 2017  . COLONOSCOPY  2014  . DILATION AND CURETTAGE OF UTERUS     Allergies  Allergen Reactions  . Codeine   . Prednisone     Depression  She has no food allergies She has nonspecific seasonal allergies  Current Outpatient Medications on File Prior to  Visit  Medication Sig  . acetaminophen (TYLENOL) 500 MG tablet Take 500 mg by mouth as needed for mild pain.  Marland Kitchen amLODipine-benazepril (LOTREL) 5-20 MG capsule Take 1 capsule by mouth daily.  Marland Kitchen atorvastatin (LIPITOR) 20 MG tablet Take 20 mg by mouth daily.  Marland Kitchen b complex vitamins capsule Take 1 capsule by mouth daily.  . brimonidine (ALPHAGAN) 0.2 % ophthalmic solution Place into both eyes 2 (two) times daily.  . cetirizine (ZYRTEC) 10 MG tablet Take 10 mg by mouth daily.  . cholecalciferol (VITAMIN D) 1000 units tablet Take 1,000 Units by mouth daily.  . Coenzyme Q10 (COQ10 PO) Take 300 mg by mouth daily.  . diclofenac sodium (VOLTAREN) 1 % GEL Apply 2 g topically 4 (four) times daily as needed.  . dorzolamide-timolol (COSOPT) 22.3-6.8 MG/ML ophthalmic solution Place 1 drop into both eyes 2 (two) times daily.  Marland Kitchen FLUoxetine (PROZAC) 10 MG capsule Take 1 capsule by mouth once daily for anxiety.  Marland Kitchen latanoprost (XALATAN) 0.005 % ophthalmic solution Place 1 drop into both eyes at bedtime.  . Multiple Vitamin (MULTIVITAMIN) tablet Take 1 tablet by mouth daily.  . nabumetone (RELAFEN) 500 MG tablet Take 500-1,000 mg by mouth as needed.   . Olopatadine HCl (PAZEO) 0.7 % SOLN Apply 1 drop to eye daily.  . Omega-3 Fatty Acids (FISH OIL) 1000 MG CAPS Take 1,000 mg  by mouth daily.  Marland Kitchen omeprazole (PRILOSEC) 20 MG capsule Take 20 mg by mouth daily.  . rivaroxaban (XARELTO) 20 MG TABS tablet Take 1 tablet (20 mg total) by mouth daily with supper.  . trolamine salicylate (ASPERCREME) 10 % cream Apply 1 application topically as needed for muscle pain.   No current facility-administered medications on file prior to visit.     Review of Systems: Constitutional: No fever, sweats, or shaking chills.  No appetite or weight deficit.  Energy level fair. Skin: No rash, scaling, sores, lumps, or jaundice. HEENT: No visual changes or hearing deficit; glaucoma. Pulmonary: No unusual cough, sore throat, or orthopnea;  former smoker. Breasts: No complaints. Cardiovascular: No coronary artery disease, angina, or myocardial infarction.  No cardiac dysrhythmia. Essential hypertension and dyslipidemia. Gastrointestinal: No indigestion, dysphagia, abdominal pain, or constipation.  Chronic microscopic colitis.  No change in bowel habits.  GERD.  No nausea or vomiting.  No melena or bright red blood per rectum. Genitourinary: No urinary frequency, urgency, hematuria, or dysuria. Musculoskeletal: Degenerative joint disease involving the left knee and right elbow.  No arthralgias or myalgias; no joint swelling, pain, or instability; osteoporosis; restless leg syndrome. Hematologic: No bleeding tendency or easy bruisability. Endocrine: No intolerance to hot or cold; Graves' disease; pre-diabetes. Vascular: No peripheral arterial or venous thromboembolic disease. Psychological: No anxiety, depression, or mood changes; sleep disturbance; no mental health illnesses. Neurological: No dizziness, lightheadedness, syncope, or near syncopal episodes; no numbness or tingling in the fingers or toes.  Physical Examination: Vital Signs: Body surface area is 2.09 meters squared.  Vitals:   06/05/18 0825  BP: 128/75  Pulse: 70  Resp: 17  Temp: 98.2 F (36.8 C)  SpO2: 100%    Filed Weights   06/05/18 0825  Weight: 206 lb 1.6 oz (93.5 kg)  ECOG PERFORMANCE STATUS: 0-1 Constitutional:  Marie Ruiz is fully nourished and developed albeit overweight. She looks age appropriate. She is friendly and cooperative without respiratory compromise at rest. Skin: No rashes, scaling, dryness, jaundice, or itching. HEENT: Head is normocephalic and atraumatic.  Pupils are equal round and reactive to light and accommodation.  Sclerae are anicteric.  Conjunctivae are pink.  No sinus tenderness nor oropharyngeal lesions.  Lips without cracking or peeling; tongue without mass, inflammation, or nodularity.  Mucous membranes are  moist. Neck: Supple and symmetric.  No jugular venous distention or thyromegaly.  Trachea is midline. Lymphatics: No cervical or supraclavicular lymphadenopathy.  No epitrochlear, axillary, or inguinal lymphadenopathy is appreciated. Respiratory/chest: Thorax is symmetrical.  Breath sounds are clear to auscultation and percussion.  Normal excursion and respiratory effort. Back: Symmetric without deformity or tenderness. Cardiovascular: Heart rate and rhythm are regular without murmurs, gallops, or rubs. Gastrointestinal: Abdomen is soft, nontender; no organomegaly.  Bowel sounds are normoactive.  No masses are appreciated. Rectal examination: Not performed. Extremities: In the lower extremities, there is no asymmetric swelling, erythema, tenderness, or cord formation.  No clubbing, cyanosis, nor edema.  Legs, feet, and toes are symmetric without swelling or discoloration. Hematologic: No petechiae, hematomas, or ecchymoses. Psychological:  She is oriented to person, place, and time; normal affect, memory, and cognition. Neurological: There are no gross neurologic deficits.  Laboratory Results: I have reviewed the data as listed: May 20, 2018  Ref Range & Units 13:37 30mo ago  WBC Count 4.0 - 10.5 K/uL 5.8  7.7   RBC 3.87 - 5.11 MIL/uL 4.66  4.48   Hemoglobin 12.0 - 15.0 g/dL 13.9  13.9  HCT 36.0 - 46.0 % 43.4  42.6   MCV 80.0 - 100.0 fL 93.1  95.1 R  MCH 26.0 - 34.0 pg 29.8  31.0   MCHC 30.0 - 36.0 g/dL 32.0  32.6   RDW 11.5 - 15.5 % 12.4  13.3   Platelet Count 150 - 400 K/uL 309  289 CM  nRBC 0.0 - 0.2 % 0.0    Neutrophils Relative % % 54    Neutro Abs 1.7 - 7.7 K/uL 3.1    Lymphocytes Relative % 33    Lymphs Abs 0.7 - 4.0 K/uL 1.9    Monocytes Relative % 7    Monocytes Absolute 0.1 - 1.0 K/uL 0.4    Eosinophils Relative % 5    Eosinophils Absolute 0.0 - 0.5 K/uL 0.3    Basophils Relative % 1    Basophils Absolute 0.0 - 0.1 K/uL 0.1    Immature Granulocytes % 0    Abs  Immature Granulocytes 0.00 - 0.07 K/uL 0.01     CMP Latest Ref Rng & Units 05/20/2018 05/06/2018 11/09/2017  Glucose 70 - 99 mg/dL 95 - 101(H)  BUN 8 - 23 mg/dL 9 - 17  Creatinine 0.44 - 1.00 mg/dL 0.82 - 0.78  Sodium 135 - 145 mmol/L 141 - 139  Potassium 3.5 - 5.1 mmol/L 3.8 - 3.9  Chloride 98 - 111 mmol/L 105 - 104  CO2 22 - 32 mmol/L 28 - 25  Calcium 8.9 - 10.3 mg/dL 9.9 - 9.8  Total Protein 6.5 - 8.1 g/dL 8.3(H) 7.6 -  Total Bilirubin 0.3 - 1.2 mg/dL 0.6 0.4 -  Alkaline Phos 38 - 126 U/L 66 67 -  AST 15 - 41 U/L 23 15 -  ALT 0 - 44 U/L 16 13 -  Factor V Leiden gene mutation: Not detected Factor VIII assay 68 Quantitative d-dimer: 0.28 Prothrombin gene mutation: Not identified Homocysteine 9.8  Ref Range & Units 2wk ago (05/20/18) 2wk ago (05/20/18)  Anticardiolipin IgG 0 - 14 GPL U/mL <9  <9 CM  Comment: (NOTE)              Negative:       <15              Indeterminate:   15 - 20              Low-Med Positive: >20 - 80              High Positive:     >80   Anticardiolipin IgM 0 - 12 MPL U/mL 18High   18High  CM  Comment: (NOTE)              Negative:       <13              Indeterminate:   13 - 20              Low-Med Positive: >20 - 80              High Positive:     >80   Anticardiolipin IgA 0 - 11 APL U/mL <9  <9 CM  Comment: (NOTE)              Negative:       <12              Indeterminate:   12 - 20              Low-Med Positive: >20 -  80              High Positive:     >80     Ref Range & Units 2wk ago (05/20/18) 2wk ago (05/20/18)  Beta-2 Glyco I IgG 0 - 20 GPI IgG units <9  <9 CM    Beta-2-Glycoprotein I IgM 0 - 32 GPI IgM units <9  <9 CM    Beta-2-Glycoprotein I IgA 0 - 25 GPI IgA units <9  <9 CM       Ref Range & Units 2wk ago (05/20/18) 2wk ago (05/20/18)   dPT 0.0 - 55.0 sec 56.8High   56.8High    dPT Confirm Ratio 0.00 - 1.40 Ratio 0.91  0.91   Thrombin Time 0.0 - 23.0 sec 17.9  17.9   PTT Lupus Anticoagulant 0.0 - 51.9 sec 51.8  51.8   DRVVT 0.0 - 47.0 sec 80.7High   80.7High    Lupus Anticoag Interp  Comment:  Comment: CM  Comment: (NOTE)   The results are consistent with the presence of a lupus anticoagulant.  NOTE: Only persistent lupus anticoagulants are thought to be of  clinical  significance. For this reason, repeat testing in 12 or more weeks  after an  initial positive result should be considered to confirm or refute the  presence of a lupus anticoagulant, depending on clinical presentation.  Results of lupus anticoagulant tests may be falsely positive in the  presence of certain anticoagulant therapies   Diagnostic/Imaging Studies: May 06, 2018 Lower Venous Study Indications: Follow up exam.  Examination Guidelines: A complete evaluation includes B-mode imaging, spectral Doppler, color Doppler, and power Doppler as needed of all accessible portions of each vessel. Bilateral testing is considered an integral part of a complete examination. Limited examinations for reoccurring indications may be performed as noted.  Left Technical Findings: DVT in the tibioperoneal vein appears resolved. DVT remains in one of the paired veins of the peronoal and posterior tibial veins with no propagation.  Summary: Right: There is no evidence of a common femoral vein obstruction. 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 Findings appear improved from previous examination. No cystic structure found in the popliteal fossa. See technical findings  above  Harold Barban MD on 05/06/2018 at 5:31:59 PM.  November 10, 2017 Lower Venous Study  Indication: Pain. Examination Guidelines: A complete evaluation includes B-mode imaging, spectral doppler,  color doppler, and power doppler as needed of all accessible portions of each vessel. Bilateral testing is considered an integral part of a complete examination. Limited examinations for reoccurring indications may be performed as noted.  Right Venous Findings: Final Interpretation: Right: No evidence of common femoral vein obstruction. Left: There is evidence of acute DVT in the Posterior Tibial vein and tibioperoneal trunk.  Todd Early  11/10/2017 at 6:54:00 PM.  Summary/Assessment: In the setting of a deep vein thrombosis in the left posterior tibial vein and tibioperoneal trunk; having been treated with rivaroxaban on an outpatient basis, she presents now to exclude an apparent unprovoked hereditary/acquired thrombophilia and determine how long she should be on anticoagulation.  On November 10, 2017 she presented to the emergency department at [facility] complaining of rather sudden onset of pain and swelling of the left lower extremity.  Both she and her husband had relocated in September and were driving back and forth from [place] to [place] and subsequently [place].  Although she was seen at [facility]  on April 22, unable 21, she presented to [facility] where a Doppler study was unavailable.  She was given 1 dose of Xarelto and diclofenac gel with instructions to obtain a duplex venous Doppler the following morning.  A left lower extremity duplex venous Doppler was performed at [facility]. The results of that study revealed within the left posterior tibial vein and tibioperitoneal trunk.  She had no chest pain, tightness, dyspnea, or tachycardia to suggest a pulmonary embolism.  No additional studies were performed.  She was discharged on rivaroxaban by protocol. She has been followed thereafter by Alma Friendly, nurse practitioner. She is tolerated rivaroxaban without difficulty. She denies any bleeding tendency.  Her overall energy level over the  past several years is declined but remains stable.  She has no bleeding tendency.  On May 06, 2018 a repeat left lower extremity duplex venous Doppler was performed at [facility]. It was compared with the previous study from April 22.  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 These findings appear improved from the previous examination.  No cystic structure was found in the popliteal fossa.  Blood flow was identified and complete occlusion was not seen. Those studies are detailed below.  At the time of her initial visit, we discussed in detail her prior history, original and more recent ultrasound of the left lower extremity which shows a steady improvement.  It was explained to her that following an acute deep vein thrombosis, there is frequently organization and fibrosis of the clot which leaves some degree of "scarring."  She has no evidence of post phlebitic syndrome.  Her legs are symmetric without swelling, pain, or discoloration.  Laboratory studies were requested today to exclude an underlying hereditary/acquired thrombophilia.  The studies are designed to help determine the duration of her therapy. Those results are detailed below. There was some concern since she had no clear provocative factors contributing to her thrombosis. She did drive fairly long distances with her husband in the process of relocating from [place]. In addition she is overweight. It is with this background she presents now for further discussion and the results of her initial laboratory studies to identify an underlying hereditary/acquired thrombophilia.  In interim since her last visit, she reports no new problems or complaints. Her appetite and weight remain stable.  She reports no new visual changes or hearing deficit. She reports no headache, dizziness, lightheadedness, syncope, or no syncopal episodes.  She has no rash or itching. She has no cough, sore throat, or orthopnea. She denies dyspnea either at rest or on minimal exertion. There is no pain or difficulty in swallowing.  No fever, shaking chills, sweats, or flulike symptoms are reported.  She has no heartburn or indigestion on medication. She has occasional diarrhea and was previously diagnosed with "microscopic colitis."  She admits to diarrhea once every 2 weeks.  She has no melena or bright red blood per rectum.  No urinary frequency, urgency, hematuria, or dysuria are reported. There is no swelling of her ankles.  She has no new arthralgias or myalgias. She has no numbness or tingling in her fingers or toes.    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  Recommendation/Plan: The results of her hypercoagulable evaluation were reviewed and discussed in lengthy detail. The presence of a lupus anticoagulant and an abnormal antiphospholipid antibody profile are independent risk factors for recurrent venous thromboembolic events.    It was explained that only a repeat or persistent abnormal test should be taken as a true independent risk factor (s).  For that reason, repeat laboratory testing was recommended on January 27 (12 weeks).  A follow-up doctor visit will be scheduled on August 26, 2018 to discuss those results with recommendations.  It was recommended that she continue on rivaroxaban for the present until further testing is complete.  She was advised to call us in the interim should any new or untoward problems arise.  The total time spent discussing the previous hypercoagulable evaluation, rationale for repeating the test with recommendations was 25  minutes. At least 50% of that time was spent in face-to-face discussion, reviewing outside records, counseling, and answering questions.  She was given ample time to have all of her questions answered.  This note was dictated using voice activated technology/software.  Unfortunately, typographical errors are not uncommon, and transcription is subject to mistakes and regrettably misinterpretation.  If necessary, clarification of the above information can be discussed with me at any time.   FOLLOW UP: AS DIRECTED   cc:        Alma Friendly, CNP   Henreitta Leber, MD  Hematology/Oncology [facility] [address] [place], [place] [address] Office: [phone] [facility]: [phone]

## 2018-06-10 ENCOUNTER — Encounter (INDEPENDENT_AMBULATORY_CARE_PROVIDER_SITE_OTHER): Payer: Self-pay

## 2018-07-01 ENCOUNTER — Other Ambulatory Visit: Payer: Self-pay | Admitting: Primary Care

## 2018-07-01 DIAGNOSIS — M150 Primary generalized (osteo)arthritis: Principal | ICD-10-CM

## 2018-07-01 DIAGNOSIS — M159 Polyosteoarthritis, unspecified: Secondary | ICD-10-CM

## 2018-07-02 NOTE — Telephone Encounter (Signed)
Last prescribed and seen on 05/06/2018

## 2018-07-02 NOTE — Telephone Encounter (Signed)
Noted, refill sent to pharmacy. 

## 2018-07-07 ENCOUNTER — Telehealth: Payer: Self-pay

## 2018-07-07 NOTE — Telephone Encounter (Signed)
Error

## 2018-07-08 ENCOUNTER — Encounter (INDEPENDENT_AMBULATORY_CARE_PROVIDER_SITE_OTHER): Payer: Self-pay

## 2018-07-16 ENCOUNTER — Ambulatory Visit
Admission: RE | Admit: 2018-07-16 | Discharge: 2018-07-16 | Disposition: A | Discharge status: Home / Self Care | Payer: Medicare Other | Source: Ambulatory Visit | Attending: Primary Care | Admitting: Primary Care

## 2018-07-16 DIAGNOSIS — M8589 Other specified disorders of bone density and structure, multiple sites: Secondary | ICD-10-CM | POA: Diagnosis not present

## 2018-07-16 DIAGNOSIS — Z1231 Encounter for screening mammogram for malignant neoplasm of breast: Secondary | ICD-10-CM | POA: Diagnosis not present

## 2018-07-16 DIAGNOSIS — Z780 Asymptomatic menopausal state: Secondary | ICD-10-CM | POA: Diagnosis not present

## 2018-07-16 DIAGNOSIS — E2839 Other primary ovarian failure: Secondary | ICD-10-CM

## 2018-07-20 ENCOUNTER — Encounter: Payer: Self-pay | Admitting: *Deleted

## 2018-07-29 ENCOUNTER — Encounter (INDEPENDENT_AMBULATORY_CARE_PROVIDER_SITE_OTHER): Payer: Self-pay

## 2018-07-31 ENCOUNTER — Other Ambulatory Visit (RURAL_HEALTH_CENTER): Payer: Self-pay | Admitting: Family Medicine

## 2018-07-31 DIAGNOSIS — H401133 Primary open-angle glaucoma, bilateral, severe stage: Secondary | ICD-10-CM | POA: Diagnosis not present

## 2018-08-04 ENCOUNTER — Telehealth: Payer: Self-pay

## 2018-08-04 NOTE — Telephone Encounter (Signed)
Pt called for refill on pazeo; pt was looking for Dr Jory Ee office. Pt did not need anything further.

## 2018-08-07 ENCOUNTER — Ambulatory Visit (INDEPENDENT_AMBULATORY_CARE_PROVIDER_SITE_OTHER): Payer: Medicare Other | Admitting: Primary Care

## 2018-08-07 ENCOUNTER — Encounter: Payer: Self-pay | Admitting: Primary Care

## 2018-08-07 VITALS — BP 106/72 | HR 64 | Temp 98.4°F | Ht 66.75 in | Wt 203.8 lb

## 2018-08-07 DIAGNOSIS — J069 Acute upper respiratory infection, unspecified: Secondary | ICD-10-CM | POA: Diagnosis not present

## 2018-08-07 MED ORDER — BENZONATATE 200 MG PO CAPS: 200.0000 mg | ORAL_CAPSULE | Freq: Three times a day (TID) | ORAL | 0 refills | Status: DC | PRN

## 2018-08-07 NOTE — Patient Instructions (Signed)
Your symptoms are representative of a viral illness which will resolve on its own over time. Our goal is to treat your symptoms in order to aid your body in the healing process and to make you more comfortable.   You may take Benzonatate capsules for cough. Take 1 capsule by mouth three times daily as needed for cough.  Congestion: Try taking Mucinex (plain). This will help loosen up the mucous in your chest. Ensure you take this medication with a full glass of water.  Please notify me if you develop persistent fevers of 101, notice increased fatigue or weakness, and/or feel worse after 1 week of onset of symptoms.   Increase consumption of water intake and rest.  It was a pleasure to see you today!

## 2018-08-07 NOTE — Progress Notes (Signed)
Subjective:    Patient ID: Marie Ruiz, female    DOB: 1945/11/06, 73 y.o.   MRN: [id number]  HPI  Ms. Marie Ruiz is a 73 year old female with a history of tobacco abuse (quit in 1990), hypertension who presents today with a chief complaint of cough.  She also reports chest congestion. She denies fevers, sore throat. Her cough is productive with light yellow sputum. She's been taking Elderberry cough suppressant, Coricidin, Tylenol without much improvement. Her symptoms began 4 days ago. She was exposed to numerous people with these symptoms about one week ago. Today she's feeling slightly better.   Review of Systems  Constitutional: Negative for chills and fever.  HENT: Positive for congestion and postnasal drip. Negative for sore throat.   Respiratory: Positive for cough. Negative for shortness of breath.        Past Medical History:  Diagnosis Date  . Anxiety   . Arthritis   . Blood clotting disorder ([place])   . Bone spur    with excision  . Chickenpox   . Colon polyps   . DVT (deep venous thrombosis) ([place])   . Glaucoma    both  . Graves disease 03/30/2013  . History of blood clots 10/2017  . Hyperlipemia   . Hypertension   . Osteopenia   . Prediabetes   . Restless leg syndrome      Social History   Socioeconomic History  . Marital status: Married    Spouse name: Not on file  . Number of children: Not on file  . Years of education: Not on file  . Highest education level: Not on file  Occupational History  . Occupation: Retired  Scientific laboratory technician  . Financial resource strain: Not on file  . Food insecurity:    Worry: Not on file    Inability: Not on file  . Transportation needs:    Medical: Not on file    Non-medical: Not on file  Tobacco Use  . Smoking status: Former Smoker    Last attempt to quit: 1990    Years since quitting: 30.0  . Smokeless tobacco: Never Used  Substance and Sexual Activity  . Alcohol use: Yes  . Drug use: Never  . Sexual  activity: Not on file  Lifestyle  . Physical activity:    Days per week: Not on file    Minutes per session: Not on file  . Stress: Not on file  Relationships  . Social connections:    Talks on phone: Not on file    Gets together: Not on file    Attends religious service: Not on file    Active member of club or organization: Not on file    Attends meetings of clubs or organizations: Not on file    Relationship status: Not on file  . Intimate partner violence:    Fear of current or ex partner: Not on file    Emotionally abused: Not on file    Physically abused: Not on file    Forced sexual activity: Not on file  Other Topics Concern  . Not on file  Social History Narrative   Lives in [place], moving to [place].   Retired.   Married.     Past Surgical History:  Procedure Laterality Date  . BREAST CYST ASPIRATION    . CATARACT EXTRACTION W/ INTRAOCULAR LENS IMPLANT Right 2015  . CATARACT EXTRACTION W/PHACO Left 2017  . COLONOSCOPY  2014  . DILATION AND CURETTAGE OF UTERUS  Family History  Problem Relation Age of Onset  . Heart disease Mother   . Heart attack Father   . Breast cancer Sister   . Colon polyps Sister   . Aneurysm Brother   . Multiple myeloma Sister        found in stage 4  . Esophageal cancer Neg Hx   . Colon cancer Neg Hx     Allergies  Allergen Reactions  . Codeine   . Prednisone     Depression    Current Outpatient Medications on File Prior to Visit  Medication Sig Dispense Refill  . acetaminophen (TYLENOL) 500 MG tablet Take 500 mg by mouth as needed for mild pain.    Marland Kitchen amLODipine-benazepril (LOTREL) 5-20 MG capsule Take 1 capsule by mouth daily. 30 capsule 3  . atorvastatin (LIPITOR) 20 MG tablet Take 20 mg by mouth daily.    Marland Kitchen b complex vitamins capsule Take 1 capsule by mouth daily.    . brimonidine (ALPHAGAN) 0.2 % ophthalmic solution Place into both eyes 2 (two) times daily.    . cetirizine (ZYRTEC) 10 MG tablet Take 10 mg by mouth  daily.    . cholecalciferol (VITAMIN D) 1000 units tablet Take 1,000 Units by mouth daily.    . Coenzyme Q10 (COQ10 PO) Take 300 mg by mouth daily.    . diclofenac sodium (VOLTAREN) 1 % GEL APPLY 2 GRAMS TO AFFECTED AREAS 4 TIMES DAILY AS NEEDED 100 g 0  . dorzolamide-timolol (COSOPT) 22.3-6.8 MG/ML ophthalmic solution Place 1 drop into both eyes 2 (two) times daily.    Marland Kitchen FLUoxetine (PROZAC) 10 MG capsule Take 1 capsule by mouth once daily for anxiety. 3 capsule 0  . latanoprost (XALATAN) 0.005 % ophthalmic solution Place 1 drop into both eyes at bedtime.    . Multiple Vitamin (MULTIVITAMIN) tablet Take 1 tablet by mouth daily.    . nabumetone (RELAFEN) 500 MG tablet Take 500-1,000 mg by mouth as needed.     . Olopatadine HCl (PAZEO) 0.7 % SOLN Apply 1 drop to eye daily.    . Omega-3 Fatty Acids (FISH OIL) 1000 MG CAPS Take 1,000 mg by mouth daily.    Marland Kitchen omeprazole (PRILOSEC) 20 MG capsule Take 20 mg by mouth daily.    . rivaroxaban (XARELTO) 20 MG TABS tablet Take 1 tablet (20 mg total) by mouth daily with supper. 30 tablet 0  . trolamine salicylate (ASPERCREME) 10 % cream Apply 1 application topically as needed for muscle pain.     No current facility-administered medications on file prior to visit.     BP 106/72   Pulse 64   Temp 98.4 F (36.9 C) (Oral)   Ht 5' 6.75" (1.695 m)   Wt 203 lb 12 oz (92.4 kg)   SpO2 98%   BMI 32.15 kg/m    Objective:   Physical Exam  Constitutional: She appears well-nourished. She does not appear ill.  HENT:  Right Ear: Tympanic membrane and ear canal normal.  Left Ear: Tympanic membrane and ear canal normal.  Nose: No mucosal edema. Right sinus exhibits no maxillary sinus tenderness and no frontal sinus tenderness. Left sinus exhibits no maxillary sinus tenderness and no frontal sinus tenderness.  Mouth/Throat: Oropharynx is clear and moist.  Neck: Neck supple.  Cardiovascular: Normal rate and regular rhythm.  Respiratory: Effort normal and  breath sounds normal. She has no wheezes.  Mild congested cough during exam  Skin: Skin is warm and dry.  Assessment & Plan:  Viral URI:  Cough, congestion x 4 days. Little improvement with OTC improvement. Exam today with clear lungs, appears ill but not sickly. Suspect viral URI and will treat with conservative measures. Rx for Ladona Ridgel sent to pharmacy. Also discussed plain Mucinex. Fluids, rest, follow up PRN.  Pleas [name], NP

## 2018-08-08 ENCOUNTER — Other Ambulatory Visit (RURAL_HEALTH_CENTER): Payer: Self-pay | Admitting: Family Medicine

## 2018-08-17 ENCOUNTER — Inpatient Hospital Stay: Payer: Medicare Other | Attending: Hematology and Oncology

## 2018-08-17 DIAGNOSIS — Z7901 Long term (current) use of anticoagulants: Secondary | ICD-10-CM | POA: Diagnosis not present

## 2018-08-17 DIAGNOSIS — I82532 Chronic embolism and thrombosis of left popliteal vein: Secondary | ICD-10-CM

## 2018-08-17 DIAGNOSIS — Z86718 Personal history of other venous thrombosis and embolism: Secondary | ICD-10-CM | POA: Diagnosis not present

## 2018-08-17 DIAGNOSIS — D6859 Other primary thrombophilia: Secondary | ICD-10-CM

## 2018-08-19 LAB — DRVVT MIX: dRVVT Mix: 77.7 s — ABNORMAL HIGH (ref 0.0–47.0)

## 2018-08-19 LAB — BETA-2-GLYCOPROTEIN I ABS, IGG/M/A
Beta-2-Glycoprotein I IgA: <9 GPI IgA units (ref 0–25)
Beta-2-Glycoprotein I IgM: <9 GPI IgM units (ref 0–32)

## 2018-08-19 LAB — DRVVT CONFIRM: DRVVT CONFIRM: 1.9 ratio — AB (ref 0.8–1.2)

## 2018-08-19 LAB — LUPUS ANTICOAGULANT PANEL
DRVVT: 132.2 s — AB (ref 0.0–47.0)
PTT LA: 50.4 s (ref 0.0–51.9)

## 2018-08-20 LAB — CARDIOLIPIN ANTIBODIES, IGG, IGM, IGA
Anticardiolipin IgA: <9 APL U/mL (ref 0–11)
Anticardiolipin IgG: <9 GPL U/mL (ref 0–14)
Anticardiolipin IgM: 20 MPL U/mL — ABNORMAL HIGH (ref 0–12)

## 2018-08-22 DIAGNOSIS — I719 Aortic aneurysm of unspecified site, without rupture: Secondary | ICD-10-CM

## 2018-08-22 HISTORY — DX: Aortic aneurysm of unspecified site, without rupture: I71.9

## 2018-08-27 ENCOUNTER — Inpatient Hospital Stay: Payer: Medicare Other | Attending: Hematology and Oncology | Admitting: Internal Medicine

## 2018-08-27 ENCOUNTER — Telehealth: Payer: Self-pay | Admitting: Internal Medicine

## 2018-08-27 VITALS — BP 125/79 | HR 61 | Temp 98.5°F | Resp 18 | Ht 66.5 in | Wt 208.4 lb

## 2018-08-27 DIAGNOSIS — R634 Abnormal weight loss: Secondary | ICD-10-CM

## 2018-08-27 DIAGNOSIS — I82402 Acute embolism and thrombosis of unspecified deep veins of left lower extremity: Secondary | ICD-10-CM | POA: Diagnosis not present

## 2018-08-27 DIAGNOSIS — Z86718 Personal history of other venous thrombosis and embolism: Secondary | ICD-10-CM | POA: Insufficient documentation

## 2018-08-27 DIAGNOSIS — I82532 Chronic embolism and thrombosis of left popliteal vein: Secondary | ICD-10-CM

## 2018-08-27 DIAGNOSIS — I10 Essential (primary) hypertension: Secondary | ICD-10-CM | POA: Diagnosis not present

## 2018-08-27 DIAGNOSIS — Z87891 Personal history of nicotine dependence: Secondary | ICD-10-CM | POA: Diagnosis not present

## 2018-08-27 DIAGNOSIS — Z720 Tobacco use: Secondary | ICD-10-CM

## 2018-08-27 DIAGNOSIS — Z79899 Other long term (current) drug therapy: Secondary | ICD-10-CM | POA: Insufficient documentation

## 2018-08-27 DIAGNOSIS — E0500 Thyrotoxicosis with diffuse goiter without thyrotoxic crisis or storm: Secondary | ICD-10-CM | POA: Insufficient documentation

## 2018-08-27 DIAGNOSIS — Z7901 Long term (current) use of anticoagulants: Secondary | ICD-10-CM | POA: Diagnosis not present

## 2018-08-27 DIAGNOSIS — R0602 Shortness of breath: Secondary | ICD-10-CM

## 2018-08-27 DIAGNOSIS — D6862 Lupus anticoagulant syndrome: Secondary | ICD-10-CM | POA: Diagnosis not present

## 2018-08-27 NOTE — Telephone Encounter (Signed)
Scheduled appt per 2/06 los.  Gave the patient contrast and instructions for CT scan.  Printed calendar and avs.

## 2018-08-27 NOTE — Progress Notes (Signed)
Diagnosis Chronic deep vein thrombosis (DVT) of popliteal vein of left lower extremity (HCC) - Plan: CBC with Differential/Platelet, Comprehensive metabolic panel, Lactate dehydrogenase, Ferritin, Lupus anticoagulant panel, VAS [place] LOWER EXTREMITY VENOUS (DVT)  Abnormal weight loss - Plan: CT ABDOMEN PELVIS W CONTRAST  Shortness of breath - Plan: CT CHEST W CONTRAST  Tobacco use - Plan: CT CHEST W CONTRAST  Staging Cancer Staging No matching staging information was found for the patient.  Assessment and Plan:  1.  Left LE DVT.  73 yr old female previously followed by Dr. Audelia Hives.  Pt was diagnosed with left posterior tibial and peroneal vein in 10/2017.  She has been on Xarelto since 10/2017.    She denies any history of miscarriages, family history of thrombosis or prior clots.    Pt had repeat left LE doppler done May 06, 2018 a repeat left lower extremity duplex venous Doppler done and it was compared with the previous study from April 22.  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 These findings appear improved from the previous examination.  No cystic structure was found in the popliteal fossa.  Blood flow was identified and complete occlusion was not seen.   Pt underwent hypercoagulable evaluation on 05/20/2018 that showed negative Factor V Leiden, Prothrombin gene mutation and normal B2 Glycoprotein.  She was found to have a positive lupus anticoagulant.   Risk factory for thrombosis  include frequent travel as pt reported driving fairly long distances with her husband in the process of relocating from [place].   I discussed with her due to findings of persistent thrombosis noted on recent doppler in 04/2019 she is recommended to continue anticoagulation.  She will be set up for CT CAP for evaluate for any evidence of occult malignancy. She has remote history of smoking.   Pt has persistent lupus anticoagulant and will have repeat testing in 10/2018. She will be notified of scan results. Pt will have bilateral LE doppler in 10/2018 and will RTC at that time to go over doppler and lab results.  We will determine if pt assistance can be provided to help with cost of medication.    2.  Skin changes.  Pt has chronic skin changes that appears consistent with seborrheic  keratosis. Pt is referred to dermatology for evaluation.   3.  HTN.  BP is 125/79.  Follow-up with PCP.   4.  Graves Disease.  Follow-up with endocrinology as directed.   5.  Health maintenance.  Pt had screening mammogram done 07/16/2018 that was negative.  Follow-up with GI as recommended.   25 minutes spent with more than 50% spent in review of records, counseling and coordination of care.    Interval History:  Historical data obtained from note dated 06/05/2018.  73 yr old female previously followed by Dr. Audelia Hives.   On November 10, 2017 she presented to the emergency department at [facility] complaining of rather sudden onset of pain and swelling of the left lower extremity.  Both she and her husband had relocated in September and were driving back and forth from [place] to [place] and subsequently [place].    Pt had LE doppler done 11/10/2017 that showed  Final Interpretation: Right: No evidence of common femoral vein obstruction. Left: There is evidence of acute DVT in the Posterior Tibial vein and tibioperoneal trunk.   She was discharged on rivaroxaban by protocol.  On May 06, 2018 a repeat left lower extremity duplex venous Doppler was performed at [facility]. It was compared with the previous study from April 22.  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 These findings appear improved from the previous examination.  No cystic structure was found in the  popliteal fossa.  Blood flow was identified and complete occlusion was not seen.    Pt gave history of driving back and forth from [place] to [place] and subsequently [place].  Hypercoagulable work-up showed the presence of a lupus anticoagulant.    Current Status:  Pt is seen today for follow-up.  She is here to go over labs.    Problem List Patient Active Problem List   Diagnosis Date Noted  . GAD (generalized anxiety disorder) [F41.1] 12/23/2017  . Essential hypertension [I10] 12/23/2017  . DVT (deep venous thrombosis) ([place]) [I82.409] 12/23/2017  . Hyperlipidemia [E78.5] 12/23/2017  . Graves disease [E05.00] 12/23/2017  . Glaucoma [H40.9] 12/23/2017  . Osteoarthritis [M19.90] 12/23/2017    Past Medical History Past Medical History:  Diagnosis Date  . Anxiety   . Arthritis   . Blood clotting disorder ([place])   . Bone spur    with excision  . Chickenpox   . Colon polyps   . DVT (deep venous thrombosis) ([place])   . Glaucoma    both  . Graves disease 03/30/2013  . History of blood clots 10/2017  . Hyperlipemia   . Hypertension   . Osteopenia   . Prediabetes   . Restless leg syndrome     Past Surgical History Past Surgical History:  Procedure Laterality Date  . BREAST CYST ASPIRATION    . CATARACT EXTRACTION W/ INTRAOCULAR LENS IMPLANT Right 2015  . CATARACT EXTRACTION W/PHACO Left 2017  . COLONOSCOPY  2014  . DILATION AND CURETTAGE OF UTERUS      Family History Family History  Problem Relation Age of Onset  . Heart disease Mother   . Heart attack Father   . Breast cancer Sister   . Colon polyps Sister   . Aneurysm Brother   . Multiple myeloma Sister        found in stage 4  . Esophageal cancer Neg Hx   . Colon cancer Neg Hx      Social History  reports that she quit smoking about 30 years ago. She has never used smokeless tobacco. She reports current alcohol use. She reports that she does not use drugs.  Medications  Current Outpatient  Medications:  .  acetaminophen (TYLENOL) 500 MG tablet, Take 500 mg by mouth as needed for mild pain., Disp: , Rfl:  .  amLODipine-benazepril (LOTREL) 5-20 MG capsule, Take 1 capsule by mouth daily., Disp: 30 capsule, Rfl: 3 .  atorvastatin (LIPITOR) 20 MG tablet, Take 20 mg by mouth daily., Disp: , Rfl:  .  b complex vitamins capsule, Take 1 capsule by mouth daily., Disp: , Rfl:  .  brimonidine (ALPHAGAN) 0.2 % ophthalmic solution, Place into both eyes 2 (two) times daily., Disp: , Rfl:  .  cetirizine (ZYRTEC) 10 MG tablet, Take 10 mg by mouth daily., Disp: , Rfl:  .  cholecalciferol (VITAMIN D) 1000 units tablet, Take 1,000 Units by mouth daily., Disp: , Rfl:  .  Coenzyme Q10 (COQ10 PO), Take 300 mg by mouth daily., Disp: , Rfl:  .  diclofenac sodium (VOLTAREN) 1 % GEL, APPLY  2 GRAMS TO AFFECTED AREAS 4 TIMES DAILY AS NEEDED, Disp: 100 g, Rfl: 0 .  dorzolamide-timolol (COSOPT) 22.3-6.8 MG/ML ophthalmic solution, Place 1 drop into both eyes 2 (two) times daily., Disp: , Rfl:  .  FLUoxetine (PROZAC) 10 MG capsule, Take 1 capsule by mouth once daily for anxiety., Disp: 3 capsule, Rfl: 0 .  latanoprost (XALATAN) 0.005 % ophthalmic solution, Place 1 drop into both eyes at bedtime., Disp: , Rfl:  .  Multiple Vitamin (MULTIVITAMIN) tablet, Take 1 tablet by mouth daily., Disp: , Rfl:  .  nabumetone (RELAFEN) 500 MG tablet, Take 500-1,000 mg by mouth as needed. , Disp: , Rfl:  .  Olopatadine HCl (PAZEO) 0.7 % SOLN, Apply 1 drop to eye daily., Disp: , Rfl:  .  Omega-3 Fatty Acids (FISH OIL) 1000 MG CAPS, Take 1,000 mg by mouth daily., Disp: , Rfl:  .  omeprazole (PRILOSEC) 20 MG capsule, Take 20 mg by mouth daily., Disp: , Rfl:  .  rivaroxaban (XARELTO) 20 MG TABS tablet, Take 1 tablet (20 mg total) by mouth daily with supper., Disp: 30 tablet, Rfl: 0 .  trolamine salicylate (ASPERCREME) 10 % cream, Apply 1 application topically as needed for muscle pain., Disp: , Rfl:   Allergies Codeine and  Prednisone  Review of Systems Review of Systems - Oncology ROS negative   Physical Exam  Vitals Wt Readings from Last 3 Encounters:  08/27/18 208 lb 6.4 oz (94.5 kg)  08/07/18 203 lb 12 oz (92.4 kg)  06/05/18 205 lb 2 oz (93 kg)   Temp Readings from Last 3 Encounters:  08/27/18 98.5 F (36.9 C) (Oral)  08/07/18 98.4 F (36.9 C) (Oral)  06/05/18 98.2 F (36.8 C) (Oral)   BP Readings from Last 3 Encounters:  08/27/18 125/79  08/07/18 106/72  06/05/18 104/66   Pulse Readings from Last 3 Encounters:  08/27/18 61  08/07/18 64  06/05/18 69   Constitutional: Well-developed, well-nourished, and in no distress.   HENT: Head: Normocephalic and atraumatic.  Mouth/Throat: No oropharyngeal exudate. Mucosa moist. Eyes: Pupils are equal, round, and reactive to light. Conjunctivae are normal. No scleral icterus.  Neck: Normal range of motion. Neck supple. No JVD present.  Cardiovascular: Normal rate, regular rhythm and normal heart sounds.  Exam reveals no gallop and no friction rub.   No murmur heard. Pulmonary/Chest: Effort normal and breath sounds normal. No respiratory distress. No wheezes.No rales.  Abdominal: Soft. Bowel sounds are normal. No distension. There is no tenderness. There is no guarding.  Musculoskeletal: No edema or tenderness.  Lymphadenopathy: No cervical, axillary or supraclavicular adenopathy.  Neurological: Alert and oriented to person, place, and time. No cranial nerve deficit.  Skin: Skin is warm and dry. Chronic skin findings that appear consistent with seborrheic keratosis. Psychiatric: Affect and judgment normal.   Labs No visits with results within 3 Day(s) from this visit.  Latest known visit with results is:  Lab on 08/17/2018  Component Date Value Ref Range Status  . PTT Lupus Anticoagulant 08/17/2018 50.4  0.0 - 51.9 sec Final  . DRVVT 08/17/2018 132.2* 0.0 - 47.0 sec Final  . Lupus Anticoag Interp 08/17/2018 Comment:   Corrected   Comment:  (NOTE) Results are consistent with the presence of a lupus anticoagulant. NOTE: Only persistent lupus anticoagulants are thought to be of clinical significance. For this reason, repeat testing in 12 or more weeks after an initial positive result should be considered to confirm or refute the presence of a lupus anticoagulant,  depending on clinical presentation. Results of lupus anticoagulant tests may be falsely positive in the presence of certain anticoagulant therapies. Performed At: [facility] [place], [place] [id number] Rush Farmer MD FH:[id number]   . Anticardiolipin IgG 08/17/2018 <9  0 - 14 GPL U/mL Final   Comment: (NOTE)                          Negative:              <15                          Indeterminate:     15 - 20                          Low-Med Positive: >20 - 80                          High Positive:         >80   . Anticardiolipin IgM 08/17/2018 20* 0 - 12 MPL U/mL Final   Comment: (NOTE)                          Negative:              <13                          Indeterminate:     13 - 20                          Low-Med Positive: >20 - 80                          High Positive:         >80   . Anticardiolipin IgA 08/17/2018 <9  0 - 11 APL U/mL Final   Comment: (NOTE)                          Negative:              <12                          Indeterminate:     12 - 20                          Low-Med Positive: >20 - 80                          High Positive:         >80 Performed At: [facility] [place], [place] [id number] Rush Farmer MD TL:[id number]   . Beta-2 Glyco I IgG 08/17/2018 <9  0 - 20 GPI IgG units Final   Comment: (NOTE) The reference interval reflects a 3SD or 99th percentile interval, which is thought to represent a potentially clinically significant result in accordance with the International Consensus Statement on the classification criteria for definitive  antiphospholipid syndrome (APS). J Thromb Haem 2006;4:295-306.   . Beta-2-Glycoprotein I IgM 08/17/2018 <9  0 - 32 GPI IgM units Final  Comment: (NOTE) The reference interval reflects a 3SD or 99th percentile interval, which is thought to represent a potentially clinically significant result in accordance with the International Consensus Statement on the classification criteria for definitive antiphospholipid syndrome (APS). J Thromb Haem 2006;4:295-306. Performed At: [facility] [place], [place] [id number] Rush Farmer MD HY:[id number]   . Beta-2-Glycoprotein I IgA 08/17/2018 <9  0 - 25 GPI IgA units Final   Comment: (NOTE) The reference interval reflects a 3SD or 99th percentile interval, which is thought to represent a potentially clinically significant result in accordance with the International Consensus Statement on the classification criteria for definitive antiphospholipid syndrome (APS). J Thromb Haem 2006;4:295-306.   Marland Kitchen dRVVT Mix 08/17/2018 77.7* 0.0 - 47.0 sec Final   Comment: (NOTE) Performed At: [facility] [place], [place] [id number] Rush Farmer MD MW:[id number]   . dRVVT Confirm 08/17/2018 1.9* 0.8 - 1.2 ratio Final   Comment: (NOTE) Performed At: [facility] [place], [place] [id number] Rush Farmer MD DG:[id number]      Pathology Orders Placed This Encounter  Procedures  . CT CHEST W CONTRAST    Standing Status:   Future    Standing Expiration Date:   08/27/2019    Order Specific Question:   If indicated for the ordered procedure, I authorize the administration of contrast media per Radiology protocol    Answer:   Yes    Order Specific Question:   Preferred imaging location?    Answer:   [facility] [place] [facility]    Order Specific Question:   Radiology Contrast Protocol - do NOT remove file path    Answer:   _0 [email or web address]  . CT ABDOMEN PELVIS W  CONTRAST    Standing Status:   Future    Standing Expiration Date:   08/27/2019    Order Specific Question:   If indicated for the ordered procedure, I authorize the administration of contrast media per Radiology protocol    Answer:   Yes    Order Specific Question:   Preferred imaging location?    Answer:   [facility]    Order Specific Question:   Is Oral Contrast requested for this exam?    Answer:   Yes, Per Radiology protocol    Order Specific Question:   Radiology Contrast Protocol - do NOT remove file path    Answer:   _1 [email or web address]  . CBC with Differential/Platelet    Standing Status:   Future    Standing Expiration Date:   08/28/2019  . Comprehensive metabolic panel    Standing Status:   Future    Standing Expiration Date:   08/28/2019  . Lactate dehydrogenase    Standing Status:   Future    Standing Expiration Date:   08/28/2019  . Ferritin    Standing Status:   Future    Standing Expiration Date:   08/28/2019  . Lupus anticoagulant panel    Standing Status:   Future    Standing Expiration Date:   08/28/2019       Zoila Shutter MD

## 2018-08-28 ENCOUNTER — Other Ambulatory Visit (HOSPITAL_COMMUNITY): Payer: Self-pay | Admitting: Internal Medicine

## 2018-08-28 DIAGNOSIS — I82532 Chronic embolism and thrombosis of left popliteal vein: Secondary | ICD-10-CM

## 2018-09-01 ENCOUNTER — Inpatient Hospital Stay: Payer: Medicare Other

## 2018-09-01 ENCOUNTER — Ambulatory Visit (HOSPITAL_COMMUNITY)
Admission: RE | Admit: 2018-09-01 | Discharge: 2018-09-01 | Disposition: A | Discharge status: Home / Self Care | Payer: Medicare Other | Source: Ambulatory Visit | Attending: Internal Medicine | Admitting: Internal Medicine

## 2018-09-01 DIAGNOSIS — E0500 Thyrotoxicosis with diffuse goiter without thyrotoxic crisis or storm: Secondary | ICD-10-CM | POA: Diagnosis not present

## 2018-09-01 DIAGNOSIS — R0602 Shortness of breath: Secondary | ICD-10-CM | POA: Diagnosis not present

## 2018-09-01 DIAGNOSIS — Z720 Tobacco use: Secondary | ICD-10-CM | POA: Insufficient documentation

## 2018-09-01 DIAGNOSIS — Z87891 Personal history of nicotine dependence: Secondary | ICD-10-CM | POA: Diagnosis not present

## 2018-09-01 DIAGNOSIS — D6862 Lupus anticoagulant syndrome: Secondary | ICD-10-CM | POA: Diagnosis not present

## 2018-09-01 DIAGNOSIS — Z86718 Personal history of other venous thrombosis and embolism: Secondary | ICD-10-CM | POA: Diagnosis not present

## 2018-09-01 DIAGNOSIS — R634 Abnormal weight loss: Secondary | ICD-10-CM | POA: Diagnosis not present

## 2018-09-01 DIAGNOSIS — I82402 Acute embolism and thrombosis of unspecified deep veins of left lower extremity: Secondary | ICD-10-CM | POA: Diagnosis not present

## 2018-09-01 DIAGNOSIS — I712 Thoracic aortic aneurysm, without rupture: Secondary | ICD-10-CM | POA: Diagnosis not present

## 2018-09-01 DIAGNOSIS — I10 Essential (primary) hypertension: Secondary | ICD-10-CM | POA: Diagnosis not present

## 2018-09-01 DIAGNOSIS — I82532 Chronic embolism and thrombosis of left popliteal vein: Secondary | ICD-10-CM

## 2018-09-01 LAB — COMPREHENSIVE METABOLIC PANEL
ALK PHOS: 66 U/L (ref 38–126)
ALT: 16 U/L (ref 0–44)
AST: 19 U/L (ref 15–41)
Albumin: 3.9 g/dL (ref 3.5–5.0)
Anion gap: 7 (ref 5–15)
BUN: 8 mg/dL (ref 8–23)
CO2: 26 mmol/L (ref 22–32)
Calcium: 9.4 mg/dL (ref 8.9–10.3)
Chloride: 106 mmol/L (ref 98–111)
Creatinine, Ser: 0.79 mg/dL (ref 0.44–1.00)
GFR calc Af Amer: >60 mL/min (ref >60)
GFR calc non Af Amer: >60 mL/min (ref >60)
Glucose, Bld: 102 mg/dL — ABNORMAL HIGH (ref 70–99)
Potassium: 3.8 mmol/L (ref 3.5–5.1)
SODIUM: 139 mmol/L (ref 135–145)
Total Bilirubin: 0.3 mg/dL (ref 0.3–1.2)
Total Protein: 7.2 g/dL (ref 6.5–8.1)

## 2018-09-01 MED ORDER — IOHEXOL 300 MG/ML  SOLN
100.0000 mL | Freq: Once | INTRAMUSCULAR | Status: AC | PRN
  Administered 2018-09-01: 100 mL via INTRAVENOUS

## 2018-09-01 MED ORDER — SODIUM CHLORIDE (PF) 0.9 % IJ SOLN
INTRAMUSCULAR | Status: AC
  Filled 2018-09-01: qty 50

## 2018-09-02 ENCOUNTER — Encounter (INDEPENDENT_AMBULATORY_CARE_PROVIDER_SITE_OTHER): Payer: Self-pay

## 2018-09-11 NOTE — Telephone Encounter (Signed)
Hello,   We would like to get this patient scheduled for a colonoscopy would you please review Dr. Tarri Glenn last note and if you think timing is appropriate to hold Xarelto 2 days prior to patients procedure or if this should be delayed.

## 2018-09-14 ENCOUNTER — Telehealth: Payer: Self-pay | Admitting: *Deleted

## 2018-09-14 NOTE — Telephone Encounter (Signed)
See Magdalene River 06-05-2018 telephone encounter.  Provided [name] with Xarelto instructions pre and post colonoscopy.  CT C/A/P results also provided as ordered by Dr. Walden Field.

## 2018-09-14 NOTE — Telephone Encounter (Signed)
See above instructions

## 2018-09-27 ENCOUNTER — Encounter (INDEPENDENT_AMBULATORY_CARE_PROVIDER_SITE_OTHER): Payer: Self-pay

## 2018-09-29 NOTE — Telephone Encounter (Signed)
Attempted to reach patient to schedule her colonoscopy. Left message on machine

## 2018-10-02 NOTE — Telephone Encounter (Signed)
Left message for patient looks like she scheduled an office visit but she can be a direct colon since we already have clearance.

## 2018-10-14 ENCOUNTER — Ambulatory Visit: Payer: Medicare Other | Admitting: Gastroenterology

## 2018-10-26 ENCOUNTER — Ambulatory Visit (HOSPITAL_COMMUNITY): Payer: Medicare Other

## 2018-10-28 ENCOUNTER — Encounter (INDEPENDENT_AMBULATORY_CARE_PROVIDER_SITE_OTHER): Payer: Self-pay

## 2018-11-09 ENCOUNTER — Telehealth: Payer: Self-pay | Admitting: Emergency Medicine

## 2018-11-09 NOTE — Telephone Encounter (Signed)
Returning pt's VM asking if she needs to have labs on 11/11/2018 and be seen by MD Higgs on 11/13/2018 or if rescheduling would be better d/t Covid-19 restrictions.  Spoke to MD Higgs over the phone, gave VO to move lab and MD appt to June per pt's preference.  Pt agreed, sending scheduling message to have appts changed.

## 2018-11-11 ENCOUNTER — Other Ambulatory Visit: Payer: Medicare Other

## 2018-11-13 ENCOUNTER — Ambulatory Visit: Payer: Medicare Other | Admitting: Internal Medicine

## 2018-12-22 ENCOUNTER — Encounter: Payer: Self-pay | Admitting: Primary Care

## 2018-12-22 ENCOUNTER — Ambulatory Visit (INDEPENDENT_AMBULATORY_CARE_PROVIDER_SITE_OTHER): Payer: Medicare Other | Admitting: Primary Care

## 2018-12-22 VITALS — BP 122/80 | HR 61 | Temp 98.2°F | Ht 66.5 in | Wt 209.5 lb

## 2018-12-22 DIAGNOSIS — H938X1 Other specified disorders of right ear: Secondary | ICD-10-CM | POA: Diagnosis not present

## 2018-12-22 DIAGNOSIS — D229 Melanocytic nevi, unspecified: Secondary | ICD-10-CM | POA: Diagnosis not present

## 2018-12-22 NOTE — Patient Instructions (Addendum)
You will be contacted regarding your referral to the dermatologist.  Please let us know if you have not been contacted within one week.   For the ear fullness you can try saline nasal spray or a short term use of Flonase (no longer than 5 days). You could also try an oral antihistamine like Zyrtec, Allegra, Claritin.   It was a pleasure to see you today!

## 2018-12-22 NOTE — Assessment & Plan Note (Signed)
No foreign body, infection, or cerumen in right ear.  Reassurance provided. Discussed to use saline nasal spray, continue Zyrtec. If no improvement can use a short course of Flonase given glaucoma history.

## 2018-12-22 NOTE — Assessment & Plan Note (Signed)
Numerous over entire body, referral placed to dermatology.

## 2018-12-22 NOTE — Progress Notes (Signed)
Subjective:    Patient ID: Marie Ruiz, female    DOB: 1946/03/12, 73 y.o.   MRN: [id number]  HPI  Marie Ruiz is a 73 year old female who presents today with a chief complaint of ear irritation. She is also wanting a referral to dermatology for monitoring purposes of several nevi. She moved from [place] several years ago and had a dermatologist at the time. She would like to establish with one locally.   Three evenings ago she placed a cotton ball into her bilateral ears as she was afraid of an insect potentially flying in. She removed the cotton swabs the following morning and has noticed a sensation that something is inside of her right ear. She doesn't have that sensation to her left ear. She's unsure if a piece of the cotton ball remains.  She denies rhinorrhea, sinus pressure, cough, fevers.   Review of Systems  Constitutional: Negative for fever.  HENT: Negative for congestion and rhinorrhea.   Respiratory: Negative for cough and shortness of breath.   Allergic/Immunologic: Positive for environmental allergies.       Past Medical History:  Diagnosis Date  . Anxiety   . Arthritis   . Blood clotting disorder ([place])   . Bone spur    with excision  . Chickenpox   . Colon polyps   . DVT (deep venous thrombosis) ([place])   . Glaucoma    both  . Graves disease 03/30/2013  . History of blood clots 10/2017  . Hyperlipemia   . Hypertension   . Osteopenia   . Prediabetes   . Restless leg syndrome      Social History   Socioeconomic History  . Marital status: Married    Spouse name: Not on file  . Number of children: Not on file  . Years of education: Not on file  . Highest education level: Not on file  Occupational History  . Occupation: Retired  Scientific laboratory technician  . Financial resource strain: Not on file  . Food insecurity:    Worry: Not on file    Inability: Not on file  . Transportation needs:    Medical: Not on file    Non-medical: Not on file  Tobacco  Use  . Smoking status: Former Smoker    Last attempt to quit: 1990    Years since quitting: 30.4  . Smokeless tobacco: Never Used  Substance and Sexual Activity  . Alcohol use: Yes  . Drug use: Never  . Sexual activity: Not on file  Lifestyle  . Physical activity:    Days per week: Not on file    Minutes per session: Not on file  . Stress: Not on file  Relationships  . Social connections:    Talks on phone: Not on file    Gets together: Not on file    Attends religious service: Not on file    Active member of club or organization: Not on file    Attends meetings of clubs or organizations: Not on file    Relationship status: Not on file  . Intimate partner violence:    Fear of current or ex partner: Not on file    Emotionally abused: Not on file    Physically abused: Not on file    Forced sexual activity: Not on file  Other Topics Concern  . Not on file  Social History Narrative   Lives in [place], moving to [place].   Retired.   Married.  Past Surgical History:  Procedure Laterality Date  . BREAST CYST ASPIRATION    . CATARACT EXTRACTION W/ INTRAOCULAR LENS IMPLANT Right 2015  . CATARACT EXTRACTION W/PHACO Left 2017  . COLONOSCOPY  2014  . DILATION AND CURETTAGE OF UTERUS      Family History  Problem Relation Age of Onset  . Heart disease Mother   . Heart attack Father   . Breast cancer Sister   . Colon polyps Sister   . Aneurysm Brother   . Multiple myeloma Sister        found in stage 4  . Esophageal cancer Neg Hx   . Colon cancer Neg Hx     Allergies  Allergen Reactions  . Codeine   . Prednisone     Depression    Current Outpatient Medications on File Prior to Visit  Medication Sig Dispense Refill  . acetaminophen (TYLENOL) 500 MG tablet Take 500 mg by mouth as needed for mild pain.    Marland Kitchen amLODipine-benazepril (LOTREL) 5-20 MG capsule Take 1 capsule by mouth daily. 30 capsule 3  . atorvastatin (LIPITOR) 20 MG tablet Take 20 mg by mouth daily.    Marland Kitchen  b complex vitamins capsule Take 1 capsule by mouth daily.    . brimonidine (ALPHAGAN) 0.2 % ophthalmic solution Place into both eyes 2 (two) times daily.    . cetirizine (ZYRTEC) 10 MG tablet Take 10 mg by mouth daily.    . cholecalciferol (VITAMIN D) 1000 units tablet Take 1,000 Units by mouth daily.    . Coenzyme Q10 (COQ10 PO) Take 300 mg by mouth daily.    . diclofenac sodium (VOLTAREN) 1 % GEL APPLY 2 GRAMS TO AFFECTED AREAS 4 TIMES DAILY AS NEEDED 100 g 0  . dorzolamide-timolol (COSOPT) 22.3-6.8 MG/ML ophthalmic solution Place 1 drop into both eyes 2 (two) times daily.    Marland Kitchen FLUoxetine (PROZAC) 10 MG capsule Take 1 capsule by mouth once daily for anxiety. 3 capsule 0  . latanoprost (XALATAN) 0.005 % ophthalmic solution Place 1 drop into both eyes at bedtime.    . Multiple Vitamin (MULTIVITAMIN) tablet Take 1 tablet by mouth daily.    . nabumetone (RELAFEN) 500 MG tablet Take 500-1,000 mg by mouth as needed.     . Olopatadine HCl (PAZEO) 0.7 % SOLN Apply 1 drop to eye daily.    . Omega-3 Fatty Acids (FISH OIL) 1000 MG CAPS Take 1,000 mg by mouth daily.    Marland Kitchen omeprazole (PRILOSEC) 20 MG capsule Take 20 mg by mouth daily.    . rivaroxaban (XARELTO) 20 MG TABS tablet Take 1 tablet (20 mg total) by mouth daily with supper. 30 tablet 0  . trolamine salicylate (ASPERCREME) 10 % cream Apply 1 application topically as needed for muscle pain.     No current facility-administered medications on file prior to visit.     BP 122/80   Pulse 61   Temp 98.2 F (36.8 C) (Tympanic)   Ht 5' 6.5" (1.689 m)   Wt 209 lb 8 oz (95 kg)   SpO2 98%   BMI 33.31 kg/m    Objective:   Physical Exam  HENT:  Right Ear: Tympanic membrane and ear canal normal.  Left Ear: Tympanic membrane and ear canal normal.  Mouth/Throat: Oropharynx is clear and moist.  No foreign body noted to either ears  Neck: Neck supple.  Respiratory: Effort normal.  Skin: Skin is warm and dry.  Assessment & Plan:

## 2018-12-30 ENCOUNTER — Ambulatory Visit (INDEPENDENT_AMBULATORY_CARE_PROVIDER_SITE_OTHER): Payer: Medicare Other

## 2018-12-30 DIAGNOSIS — Z1159 Encounter for screening for other viral diseases: Secondary | ICD-10-CM | POA: Diagnosis not present

## 2018-12-30 DIAGNOSIS — Z0000 Encounter for general adult medical examination without abnormal findings: Secondary | ICD-10-CM | POA: Diagnosis not present

## 2018-12-30 NOTE — Progress Notes (Signed)
Subjective:   Marie Ruiz is a 73 y.o. female who presents for an Initial Medicare Annual Wellness Visit.  Review of Systems    N/A  Cardiac Risk Factors include: advanced age (>60mn, >>46women);obesity (BMI >30kg/m2);dyslipidemia;hypertension     Objective:    Today's Vitals   12/30/18 1709  PainSc: 0-No pain   There is no height or weight on file to calculate BMI.  Advanced Directives 12/30/2018 06/05/2018 05/20/2018  Does Patient Have a Medical Advance Directive? Yes No No  Type of AParamedicof [place]en Arnold[place] will - -  Copy of [place]? No - copy requested - -  Would patient like information on creating a medical advance directive? - No - Patient declined No - Patient declined    Current Medications (verified) Outpatient Encounter Medications as of 12/30/2018  Medication Sig  . acetaminophen (TYLENOL) 500 MG tablet Take 500 mg by mouth as needed for mild pain.  .Marland KitchenamLODipine-benazepril (LOTREL) 5-20 MG capsule Take 1 capsule by mouth daily.  .Marland Kitchenatorvastatin (LIPITOR) 20 MG tablet Take 20 mg by mouth daily.  .Marland Kitchenb complex vitamins capsule Take 1 capsule by mouth daily.  . brimonidine (ALPHAGAN) 0.2 % ophthalmic solution Place into both eyes 2 (two) times daily.  . cetirizine (ZYRTEC) 10 MG tablet Take 10 mg by mouth daily.  . cholecalciferol (VITAMIN D) 1000 units tablet Take 1,000 Units by mouth daily.  . Coenzyme Q10 (COQ10 PO) Take 300 mg by mouth daily.  . diclofenac sodium (VOLTAREN) 1 % GEL APPLY 2 GRAMS TO AFFECTED AREAS 4 TIMES DAILY AS NEEDED  . dorzolamide-timolol (COSOPT) 22.3-6.8 MG/ML ophthalmic solution Place 1 drop into both eyes 2 (two) times daily.  .Marland KitchenFLUoxetine (PROZAC) 10 MG capsule Take 1 capsule by mouth once daily for anxiety.  .Marland Kitchenlatanoprost (XALATAN) 0.005 % ophthalmic solution Place 1 drop into both eyes at bedtime.  . Multiple Vitamin (MULTIVITAMIN) tablet Take 1 tablet by mouth daily.  .  nabumetone (RELAFEN) 500 MG tablet Take 500-1,000 mg by mouth as needed.   . Olopatadine HCl (PAZEO) 0.7 % SOLN Apply 1 drop to eye daily.  . Omega-3 Fatty Acids (FISH OIL) 1000 MG CAPS Take 1,000 mg by mouth daily.  .Marland Kitchenomeprazole (PRILOSEC) 20 MG capsule Take 20 mg by mouth daily.  . rivaroxaban (XARELTO) 20 MG TABS tablet Take 1 tablet (20 mg total) by mouth daily with supper.  . trolamine salicylate (ASPERCREME) 10 % cream Apply 1 application topically as needed for muscle pain.   No facility-administered encounter medications on file as of 12/30/2018.     Allergies (verified) Codeine and Prednisone   History: Past Medical History:  Diagnosis Date  . Anxiety   . Arthritis   . Blood [place]ting disorder ([place]   . Bone spur    with excision  . Chickenpox   . Colon polyps   . DVT (deep venous thrombosis) ([place]   . Glaucoma    both  . Graves disease 03/30/2013  . History of blood clots 10/2017  . Hyperlipemia   . Hypertension   . Osteopenia   . Prediabetes   . Restless leg syndrome    Past Surgical History:  Procedure Laterality Date  . BREAST CYST ASPIRATION    . CATARACT EXTRACTION W/ INTRAOCULAR LENS IMPLANT Right 2015  . CATARACT EXTRACTION W/PHACO Left 2017  . COLONOSCOPY  2014  . DILATION AND CURETTAGE OF UTERUS     Family History  Problem Relation Age of Onset  . Heart disease Mother   . Heart attack Father   . Breast cancer Sister   . Colon polyps Sister   . Aneurysm Brother   . Multiple myeloma Sister        found in stage 4  . Esophageal cancer Neg Hx   . Colon cancer Neg Hx    Social History   Socioeconomic History  . Marital status: Married    Spouse name: Not on file  . Number of children: Not on file  . Years of education: Not on file  . Highest education level: Not on file  Occupational History  . Occupation: Retired  Scientific laboratory technician  . Financial resource strain: Not on file  . Food insecurity:    Worry: Not on file    Inability: Not on file   . Transportation needs:    Medical: Not on file    Non-medical: Not on file  Tobacco Use  . Smoking status: Former Smoker    Last attempt to quit: 1990    Years since quitting: 30.4  . Smokeless tobacco: Never Used  Substance and Sexual Activity  . Alcohol use: Yes    Alcohol/week: 2.0 standard drinks    Types: 2 Standard drinks or equivalent per week  . Drug use: Not Currently  . Sexual activity: Not Currently  Lifestyle  . Physical activity:    Days per week: Not on file    Minutes per session: Not on file  . Stress: Not on file  Relationships  . Social connections:    Talks on phone: Not on file    Gets together: Not on file    Attends religious service: Not on file    Active member of club or organization: Not on file    Attends meetings of clubs or organizations: Not on file    Relationship status: Not on file  Other Topics Concern  . Not on file  Social History Narrative   Lives in [place], moving to [place].   Retired.   Married.     Tobacco Counseling Counseling given: No   Clinical Intake:  Pre-visit preparation completed: Yes  Pain : No/denies pain Pain Score: 0-No pain     Nutritional Status: BMI > 30  Obese Nutritional Risks: None Diabetes: No  How often do you need to have someone help you when you read instructions, pamphlets, or other written materials from your doctor or pharmacy?: 1 - Never What is the last grade level you completed in school?: 12th grade + 2 yrs college  Interpreter Needed?: No  Comments: pt lives with spouse Information entered by :: LPinson, LPN   Activities of Daily Living In your present state of health, do you have any difficulty performing the following activities: 12/30/2018  Hearing? N  Vision? N  Difficulty concentrating or making decisions? N  Walking or climbing stairs? N  Dressing or bathing? N  Doing errands, shopping? N  Preparing Food and eating ? N  Using the Toilet? N  In the past six months, have  you accidently leaked urine? N  Do you have problems with loss of bowel control? N  Managing your Medications? N  Managing your Finances? N  Housekeeping or managing your Housekeeping? N  Some recent data might be hidden     Immunizations and Health Maintenance Immunization History  Administered Date(s) Administered  . Influenza, High Dose Seasonal PF 04/27/2014, 05/01/2015, 05/02/2016, 05/06/2017, 04/22/2018  . Pneumococcal Conjugate-13 04/27/2015,  02/12/2016  . Pneumococcal Polysaccharide-23 06/20/2014  . Tdap 06/19/2016   There are no preventive care reminders to display for this patient.  Patient Care Team: Pleas [name], NP as PCP - General (Internal Medicine)  Indicate any recent Medical Services you may have received from other than [facility] providers in the past year (date may be approximate).     Assessment:   This is a routine wellness examination for [place].  Hearing/Vision screen No exam data present  Dietary issues and exercise activities discussed: Current Exercise Habits: Home exercise routine, Type of exercise: walking, Time (Minutes): 15, Frequency (Times/Week): 3, Weekly Exercise (Minutes/Week): 45, Intensity: Mild, Exercise limited by: None identified  Goals    . Patient Stated     Starting 12/30/18, I will continue to take medications as prescribed.       Depression Screen PHQ 2/9 Scores 12/30/2018  PHQ - 2 Score 0  PHQ- 9 Score 0    Fall Risk Fall Risk  12/30/2018 05/06/2018  Falls in the past year? 1 No  Comment fell out of a chair -  Number falls in past yr: 0 -  Injury with Fall? 0 -   Cognitive Function: MMSE - Mini Mental State Exam 12/30/2018  Orientation to time 5  Orientation to Place 5  Registration 3  Attention/ Calculation 0  Recall 3  Language- name 2 objects 0  Language- repeat 1  Language- follow 3 step command 0  Language- read & follow direction 0  Write a sentence 0  Copy design 0  Total score 17       PLEASE  NOTE: A Mini-Cog screen was completed. Maximum score is 17. A value of 0 denotes this part of [place] MMSE was not completed or the patient failed this part of the Mini-Cog screening.   Mini-Cog Screening Orientation to Time - Max 5 pts Orientation to Place - Max 5 pts Registration - Max 3 pts Recall - Max 3 pts Language Repeat - Max 1 pts    Screening Tests Health Maintenance  Topic Date Due  . Hepatitis C Screening  07/22/2019 (Originally May 27, 1946)  . INFLUENZA VACCINE  02/20/2019  . MAMMOGRAM  07/16/2020  . COLONOSCOPY  07/22/2022  . TETANUS/TDAP  06/19/2026  . DEXA SCAN  Completed  . PNA vac Low Risk Adult  Completed     Plan:     I have personally reviewed, addressed, and noted the following in the patient's chart:  A. Medical and social history B. Use of alcohol, tobacco or illicit drugs  C. Current medications and supplements D. Functional ability and status E.  Nutritional status F.  Physical activity G. Advance directives H. List of other physicians I.  Hospitalizations, surgeries, and ER visits in previous 12 months J.  Vitals (unless it is a telemedicine encounter) K. Screenings to include cognitive, depression, hearing, vision (NOTE: hearing and vision screenings not completed in telemedicine encounter) L. Referrals and appointments   In addition, I have reviewed and discussed with patient certain preventive protocols, quality metrics, and best practice recommendations. A written personalized care plan for preventive services and recommendations were provided to patient.  With patient's permission, we connected on 12/30/18 at 12:30 PM EDT by a video enabled telemedicine application. Two patient identifiers were used to ensure the encounter occurred with the correct person.    Patient was in home and writer was in office.   Signed,   Lindell Noe, MHA, BS, LPN Health Coach

## 2018-12-30 NOTE — Progress Notes (Signed)
PCP notes:   Health maintenance:  Hep C screening - ordered generated for PCP approval  Abnormal screenings:   Fall risk - hx of single fall Fall Risk  12/30/2018 05/06/2018  Falls in the past year? 1 No  Comment fell out of a chair -  Number falls in past yr: 0 -  Injury with Fall? 0 -    Patient concerns:   None  Nurse concerns:  None  Next PCP appt:   02/01/19 @ 1020

## 2018-12-30 NOTE — Patient Instructions (Signed)
Marie Ruiz , Thank you for taking time to come for your Medicare Wellness Visit. I appreciate your ongoing commitment to your health goals. Please review the following plan we discussed and let me know if I can assist you in the future.   These are the goals we discussed: Goals    . Patient Stated     Starting 12/30/18, I will continue to take medications as prescribed.        This is a list of the screening recommended for you and due dates:  Health Maintenance  Topic Date Due  .  Hepatitis C: One time screening is recommended by Center for Disease Control  (CDC) for  adults born from [birth year] through 1965.   07/22/2019*  . Flu Shot  02/20/2019  . Mammogram  07/16/2020  . Colon Cancer Screening  07/22/2022  . Tetanus Vaccine  06/19/2026  . DEXA scan (bone density measurement)  Completed  . Pneumonia vaccines  Completed  *Topic was postponed. The date shown is not the original due date.   Preventive Care for Adults  A healthy lifestyle and preventive care can promote health and wellness. Preventive health guidelines for adults include the following key practices.  . A routine yearly physical is a good way to check with your health care provider about your health and preventive screening. It is a chance to share any concerns and updates on your health and to receive a thorough exam.  . Visit your dentist for a routine exam and preventive care every 6 months. Brush your teeth twice a day and floss once a day. Good oral hygiene prevents tooth decay and gum disease.  . The frequency of eye exams is based on your age, health, family medical history, use  of contact lenses, and other factors. Follow your health care provider's recommendations for frequency of eye exams.  . Eat a healthy diet. Foods like vegetables, fruits, whole grains, low-fat dairy products, and lean protein foods contain the nutrients you need without too many calories. Decrease your intake of foods high in solid fats,  added sugars, and salt. Eat the right amount of calories for you. Get information about a proper diet from your health care provider, if necessary.  . Regular physical exercise is one of the most important things you can do for your health. Most adults should get at least 150 minutes of moderate-intensity exercise (any activity that increases your heart rate and causes you to sweat) each week. In addition, most adults need muscle-strengthening exercises on 2 or more days a week.  Silver Sneakers may be a benefit available to you. To determine eligibility, you may visit the website: www.silversneakers.com or contact program at (270)869-3104 Mon-Fri between 8AM-8PM.   . Maintain a healthy weight. The body mass index (BMI) is a screening tool to identify possible weight problems. It provides an estimate of body fat based on height and weight. Your health care provider can find your BMI and can help you achieve or maintain a healthy weight.   For adults 20 years and older: ? A BMI below 18.5 is considered underweight. ? A BMI of 18.5 to 24.9 is normal. ? A BMI of 25 to 29.9 is considered overweight. ? A BMI of 30 and above is considered obese.   . Maintain normal blood lipids and cholesterol levels by exercising and minimizing your intake of saturated fat. Eat a balanced diet with plenty of fruit and vegetables. Blood tests for lipids and cholesterol should begin at  age 41 and be repeated every 5 years. If your lipid or cholesterol levels are high, you are over 50, or you are at high risk for heart disease, you may need your cholesterol levels checked more frequently. Ongoing high lipid and cholesterol levels should be treated with medicines if diet and exercise are not working.  . If you smoke, find out from your health care provider how to quit. If you do not use tobacco, please do not start.  . If you choose to drink alcohol, please do not consume more than 2 drinks per day. One drink is  considered to be 12 ounces (355 mL) of beer, 5 ounces (148 mL) of wine, or 1.5 ounces (44 mL) of liquor.  . If you are 61-37 years old, ask your health care provider if you should take aspirin to prevent strokes.  . Use sunscreen. Apply sunscreen liberally and repeatedly throughout the day. You should seek shade when your shadow is shorter than you. Protect yourself by wearing long sleeves, pants, a wide-brimmed hat, and sunglasses year round, whenever you are outdoors.  . Once a month, do a whole body skin exam, using a mirror to look at the skin on your back. Tell your health care provider of new moles, moles that have irregular borders, moles that are larger than a pencil eraser, or moles that have changed in shape or color.

## 2018-12-31 NOTE — Progress Notes (Signed)
I reviewed health advisor's note, was available for consultation, and agree with documentation and plan.  

## 2019-01-08 ENCOUNTER — Other Ambulatory Visit (HOSPITAL_COMMUNITY): Payer: Self-pay | Admitting: Internal Medicine

## 2019-01-08 ENCOUNTER — Other Ambulatory Visit: Payer: Self-pay | Admitting: Internal Medicine

## 2019-01-08 ENCOUNTER — Telehealth: Payer: Self-pay | Admitting: Internal Medicine

## 2019-01-08 DIAGNOSIS — I824Y2 Acute embolism and thrombosis of unspecified deep veins of left proximal lower extremity: Secondary | ICD-10-CM

## 2019-01-08 NOTE — Telephone Encounter (Signed)
Spoke with patient re doppler for 6/23 @ 10 am at [facility]. Scheduled per 6/19 schedule message. Scheduled with Butch Penny.

## 2019-01-11 DIAGNOSIS — D225 Melanocytic nevi of trunk: Secondary | ICD-10-CM | POA: Diagnosis not present

## 2019-01-11 DIAGNOSIS — L820 Inflamed seborrheic keratosis: Secondary | ICD-10-CM | POA: Diagnosis not present

## 2019-01-12 ENCOUNTER — Other Ambulatory Visit: Payer: Self-pay

## 2019-01-12 ENCOUNTER — Ambulatory Visit (HOSPITAL_COMMUNITY)
Admission: RE | Admit: 2019-01-12 | Discharge: 2019-01-12 | Disposition: A | Discharge status: Home / Self Care | Payer: Medicare Other | Source: Ambulatory Visit | Attending: Internal Medicine | Admitting: Internal Medicine

## 2019-01-12 DIAGNOSIS — I824Y2 Acute embolism and thrombosis of unspecified deep veins of left proximal lower extremity: Secondary | ICD-10-CM | POA: Insufficient documentation

## 2019-01-12 NOTE — Progress Notes (Signed)
Lower extremity venous has been completed.   Preliminary results in CV Proc.   [name] 01/12/2019 10:06 AM

## 2019-01-13 ENCOUNTER — Inpatient Hospital Stay (HOSPITAL_BASED_OUTPATIENT_CLINIC_OR_DEPARTMENT_OTHER): Payer: Medicare Other | Admitting: Internal Medicine

## 2019-01-13 ENCOUNTER — Other Ambulatory Visit: Payer: Self-pay | Admitting: Primary Care

## 2019-01-13 ENCOUNTER — Other Ambulatory Visit: Payer: Self-pay

## 2019-01-13 ENCOUNTER — Telehealth: Payer: Self-pay | Admitting: Internal Medicine

## 2019-01-13 ENCOUNTER — Inpatient Hospital Stay: Payer: Medicare Other | Attending: Internal Medicine

## 2019-01-13 VITALS — BP 115/72 | HR 65 | Temp 98.5°F | Resp 18 | Ht 66.5 in | Wt 209.6 lb

## 2019-01-13 DIAGNOSIS — Z87891 Personal history of nicotine dependence: Secondary | ICD-10-CM

## 2019-01-13 DIAGNOSIS — I700 Atherosclerosis of aorta: Secondary | ICD-10-CM | POA: Diagnosis not present

## 2019-01-13 DIAGNOSIS — E0500 Thyrotoxicosis with diffuse goiter without thyrotoxic crisis or storm: Secondary | ICD-10-CM | POA: Insufficient documentation

## 2019-01-13 DIAGNOSIS — I10 Essential (primary) hypertension: Secondary | ICD-10-CM | POA: Insufficient documentation

## 2019-01-13 DIAGNOSIS — I77810 Thoracic aortic ectasia: Secondary | ICD-10-CM

## 2019-01-13 DIAGNOSIS — R760 Raised antibody titer: Secondary | ICD-10-CM | POA: Insufficient documentation

## 2019-01-13 DIAGNOSIS — I82532 Chronic embolism and thrombosis of left popliteal vein: Secondary | ICD-10-CM

## 2019-01-13 DIAGNOSIS — Z86718 Personal history of other venous thrombosis and embolism: Secondary | ICD-10-CM

## 2019-01-13 LAB — COMPREHENSIVE METABOLIC PANEL
ALT: 21 U/L (ref 0–44)
AST: 24 U/L (ref 15–41)
Albumin: 4.1 g/dL (ref 3.5–5.0)
Alkaline Phosphatase: 61 U/L (ref 38–126)
Anion gap: 9 (ref 5–15)
BUN: 11 mg/dL (ref 8–23)
CO2: 26 mmol/L (ref 22–32)
Calcium: 9.5 mg/dL (ref 8.9–10.3)
Chloride: 105 mmol/L (ref 98–111)
Creatinine, Ser: 0.86 mg/dL (ref 0.44–1.00)
GFR calc Af Amer: >60 mL/min (ref >60)
GFR calc non Af Amer: >60 mL/min (ref >60)
Glucose, Bld: 95 mg/dL (ref 70–99)
Potassium: 3.9 mmol/L (ref 3.5–5.1)
Sodium: 140 mmol/L (ref 135–145)
Total Bilirubin: 0.5 mg/dL (ref 0.3–1.2)
Total Protein: 7.6 g/dL (ref 6.5–8.1)

## 2019-01-13 LAB — CBC WITH DIFFERENTIAL/PLATELET
Abs Immature Granulocytes: 0.01 10*3/uL (ref 0.00–0.07)
Basophils Absolute: 0.1 10*3/uL (ref 0.0–0.1)
Basophils Relative: 1 %
Eosinophils Absolute: 0.2 10*3/uL (ref 0.0–0.5)
Eosinophils Relative: 5 %
HCT: 39.3 % (ref 36.0–46.0)
Hemoglobin: 12.7 g/dL (ref 12.0–15.0)
Immature Granulocytes: 0 %
Lymphocytes Relative: 37 %
Lymphs Abs: 1.6 10*3/uL (ref 0.7–4.0)
MCH: 30.3 pg (ref 26.0–34.0)
MCHC: 32.3 g/dL (ref 30.0–36.0)
MCV: 93.8 fL (ref 80.0–100.0)
Monocytes Absolute: 0.5 10*3/uL (ref 0.1–1.0)
Monocytes Relative: 11 %
Neutro Abs: 2.1 10*3/uL (ref 1.7–7.7)
Neutrophils Relative %: 46 %
Platelets: 283 10*3/uL (ref 150–400)
RBC: 4.19 MIL/uL (ref 3.87–5.11)
RDW: 12.4 % (ref 11.5–15.5)
WBC: 4.5 10*3/uL (ref 4.0–10.5)
nRBC: 0 % (ref 0.0–0.2)

## 2019-01-13 LAB — FERRITIN: Ferritin: 169 ng/mL (ref 11–307)

## 2019-01-13 LAB — LACTATE DEHYDROGENASE: LDH: 143 U/L (ref 98–192)

## 2019-01-13 NOTE — Telephone Encounter (Signed)
Patient stated that she would like to know if you could send a prescription in for Nabumetone.  She stated that Dr Sabra Heck had previously prescribed this medication but he is located in [place]. And her Script has expired     [facility] [place]   Patient's C/B # [phone]

## 2019-01-13 NOTE — Progress Notes (Signed)
Diagnosis Personal history of thromboembolic disease - Plan: CBC with Differential (Cancer Center Only), CMP ([place] only), Lactate dehydrogenase (LDH), Lupus anticoagulant panel*, Ambulatory referral to Cardiology  Atherosclerosis of aorta ([place]) - Plan: CBC with Differential ([place] Only), CMP ([place] only), Lactate dehydrogenase (LDH), Lupus anticoagulant panel*, Ambulatory referral to Cardiology  Chronic deep vein thrombosis (DVT) of popliteal vein of left lower extremity ([place]) - Plan: CBC with Differential ([place] Only), CMP ([place] only), Lactate dehydrogenase (LDH), Lupus anticoagulant panel*, Ambulatory referral to Cardiology  Staging Cancer Staging No matching staging information was found for the patient.  Assessment and Plan:  1.  Left LE DVT.  73 yr old female previously followed by Dr. Audelia Hives.  Pt was diagnosed with left posterior tibial and peroneal vein in 10/2017.  She has been on Xarelto since 10/2017.    She denies any history of miscarriages, family history of thrombosis or prior clots.    Pt had repeat left LE doppler done May 06, 2018 a repeat left lower extremity duplex venous Doppler done and it was compared with the previous study from November 10, 2017.  The results of that study showed findings consistent with a chronic deep vein thrombosis involving the left posterior tibial vein and left peroneal vein. These findings appear improved from the previous examination.  No cystic structure was found in the popliteal fossa.  Blood flow was identified and complete occlusion was not seen.   Pt underwent hypercoagulable evaluation on 05/20/2018 that showed negative Factor V Leiden, Prothrombin gene mutation and normal B2 Glycoprotein.  She was found to have a positive lupus anticoagulant.   Risk factory for thrombosis  include frequent travel as pt reported driving fairly long distances with her husband in the process of relocating from [place].    Due to findings of persistent thrombosis noted on doppler in 04/2018 she was recommended to continue anticoagulation.  CT CAP done 09/01/2018 reviewed and showed IMPRESSION: 1. No acute abnormality. 2. No lymphadenopathy or other CT findings to suggest neoplastic disease. 3. Ectatic 4.1 cm ascending thoracic aorta. Recommend annual imaging followup by CTA or MRA.  4.  Aortic Atherosclerosis    Bilateral LE doppler done 01/12/2019 showed  Summary: Right: No evidence of common femoral vein obstruction. Left: There is no evidence of deep vein thrombosis in the lower extremity.  I discussed with her the previous lupus anticoagulant testing that was done in 04/2018 could have been affected by Xarelto.  Awaiting results from today's labs.  Recent imaging shows no evidence of DVT.  I discussed with her as she has been on anticoagulation since 10/2017 and this was her first clot, it is reasonable to give her a trial off therapy and repeat lupus anticoagulant testing off anticoagulation.  Pt will RTC in 04/2019 with labs.  Pt advised to notify the office if any SOB or leg swelling or change in symptoms once off anticoagulation.  2.  Atherosclerosis and Ectasis of aorta.  This was noted on CT of chest done 08/2018.  Pt is referred to cardiology for ongoing follow-up of findings.    3.  HTN.  BP is 115/72.  Follow-up with PCP.   4.  Graves Disease.  Follow-up with endocrinology as directed.   5.  Health maintenance.  Pt had screening mammogram done 07/16/2018 that was negative.  Follow-up with GI as recommended.   25 minutes spent with more than 50% spent in review of records, counseling and coordination of care.  Interval History:  Historical data obtained from note dated 06/05/2018.  73 yr old female previously followed by Dr. Audelia Hives.   On November 10, 2017 she presented to the emergency department at [facility] complaining of rather sudden onset of pain and swelling of the left  lower extremity.  Both she and her husband had relocated in September and were driving back and forth from [place] to [place] and subsequently [place].    Pt had LE doppler done 11/10/2017 that showed  Final Interpretation: Right: No evidence of common femoral vein obstruction. Left: There is evidence of acute DVT in the Posterior Tibial vein and tibioperoneal trunk.   She was discharged on rivaroxaban by protocol.   On May 06, 2018 a repeat left lower extremity duplex venous Doppler was performed at [facility]. It was compared with the previous study from November 10, 2017.  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 These findings appear improved from the previous examination.  No cystic structure was found in the popliteal fossa.  Blood flow was identified and complete occlusion was not seen.    Pt gave history of driving back and forth from [place] to [place] and subsequently [place].  Hypercoagulable work-up showed the presence of a lupus anticoagulant on labs done 04/2018.    Current Status:  Pt is seen today for follow-up.  She is here to go over scans and xrays.    Problem List Patient Active Problem List   Diagnosis Date Noted  . Sensation of fullness in right ear [H93.8X1] 12/22/2018  . Multiple nevi [D22.9] 12/22/2018  . GAD (generalized anxiety disorder) [F41.1] 12/23/2017  . Essential hypertension [I10] 12/23/2017  . DVT (deep venous thrombosis) ([place]) [I82.409] 12/23/2017  . Hyperlipidemia [E78.5] 12/23/2017  . Graves disease [E05.00] 12/23/2017  . Glaucoma [H40.9] 12/23/2017  . Osteoarthritis [M19.90] 12/23/2017    Past Medical History Past Medical History:  Diagnosis Date  . Anxiety   . Arthritis   . Blood clotting disorder ([place])   . Bone spur    with excision  . Chickenpox   . Colon polyps   . DVT (deep venous thrombosis) ([place])   . Glaucoma     both  . Graves disease 03/30/2013  . History of blood clots 10/2017  . Hyperlipemia   . Hypertension   . Osteopenia   . Prediabetes   . Restless leg syndrome     Past Surgical History Past Surgical History:  Procedure Laterality Date  . BREAST CYST ASPIRATION    . CATARACT EXTRACTION W/ INTRAOCULAR LENS IMPLANT Right 2015  . CATARACT EXTRACTION W/PHACO Left 2017  . COLONOSCOPY  2014  . DILATION AND CURETTAGE OF UTERUS      Family History Family History  Problem Relation Age of Onset  . Heart disease Mother   . Heart attack Father   . Breast cancer Sister   . Colon polyps Sister   . Aneurysm Brother   . Multiple myeloma Sister        found in stage 4  . Esophageal cancer Neg Hx   . Colon cancer Neg Hx      Social History  reports that she quit smoking about 30 years ago. She has never used smokeless tobacco. She reports current alcohol use of about 2.0 standard drinks of alcohol per week. She reports previous drug use.  Medications  Current Outpatient Medications:  .  acetaminophen (TYLENOL) 500 MG tablet, Take 500 mg by mouth as needed for mild pain., Disp: , Rfl:  .  amLODipine-benazepril (LOTREL) 5-20 MG capsule, Take 1 capsule by mouth daily., Disp: 30 capsule, Rfl: 3 .  atorvastatin (LIPITOR) 20 MG tablet, Take 20 mg by mouth daily., Disp: , Rfl:  .  b complex vitamins capsule, Take 1 capsule by mouth daily., Disp: , Rfl:  .  brimonidine (ALPHAGAN) 0.2 % ophthalmic solution, Place into both eyes 2 (two) times daily., Disp: , Rfl:  .  cetirizine (ZYRTEC) 10 MG tablet, Take 10 mg by mouth daily., Disp: , Rfl:  .  cholecalciferol (VITAMIN D) 1000 units tablet, Take 1,000 Units by mouth daily., Disp: , Rfl:  .  Coenzyme Q10 (COQ10 PO), Take 300 mg by mouth daily., Disp: , Rfl:  .  diclofenac sodium (VOLTAREN) 1 % GEL, APPLY 2 GRAMS TO AFFECTED AREAS 4 TIMES DAILY AS NEEDED, Disp: 100 g, Rfl: 0 .  dorzolamide-timolol (COSOPT) 22.3-6.8 MG/ML ophthalmic solution,  Place 1 drop into both eyes 2 (two) times daily., Disp: , Rfl:  .  FLUoxetine (PROZAC) 10 MG capsule, Take 1 capsule by mouth once daily for anxiety., Disp: 3 capsule, Rfl: 0 .  latanoprost (XALATAN) 0.005 % ophthalmic solution, Place 1 drop into both eyes at bedtime., Disp: , Rfl:  .  Multiple Vitamin (MULTIVITAMIN) tablet, Take 1 tablet by mouth daily., Disp: , Rfl:  .  nabumetone (RELAFEN) 500 MG tablet, Take 500-1,000 mg by mouth as needed. , Disp: , Rfl:  .  Olopatadine HCl (PAZEO) 0.7 % SOLN, Apply 1 drop to eye daily., Disp: , Rfl:  .  Omega-3 Fatty Acids (FISH OIL) 1000 MG CAPS, Take 1,000 mg by mouth daily., Disp: , Rfl:  .  omeprazole (PRILOSEC) 20 MG capsule, Take 20 mg by mouth daily., Disp: , Rfl:  .  rivaroxaban (XARELTO) 20 MG TABS tablet, Take 1 tablet (20 mg total) by mouth daily with supper., Disp: 30 tablet, Rfl: 0 .  trolamine salicylate (ASPERCREME) 10 % cream, Apply 1 application topically as needed for muscle pain., Disp: , Rfl:   Allergies Codeine and Prednisone  Review of Systems Review of Systems - Oncology ROS negative   Physical Exam  Vitals Wt Readings from Last 3 Encounters:  01/13/19 209 lb 9.6 oz (95.1 kg)  12/22/18 209 lb 8 oz (95 kg)  08/27/18 208 lb 6.4 oz (94.5 kg)   Temp Readings from Last 3 Encounters:  01/13/19 98.5 F (36.9 C) (Oral)  12/22/18 98.2 F (36.8 C) (Tympanic)  08/27/18 98.5 F (36.9 C) (Oral)   BP Readings from Last 3 Encounters:  01/13/19 115/72  12/22/18 122/80  08/27/18 125/79   Pulse Readings from Last 3 Encounters:  01/13/19 65  12/22/18 61  08/27/18 61    Constitutional: Well-developed, well-nourished, and in no distress.   HENT: Head: Normocephalic and atraumatic.  Mouth/Throat: No oropharyngeal exudate. Mucosa moist. Eyes: Pupils are equal, round, and reactive to light. Conjunctivae are normal. No scleral icterus.  Neck: Normal range of motion. Neck supple. No JVD present.  Cardiovascular: Normal rate,  regular rhythm and normal heart sounds.  Exam reveals no gallop and no friction rub.   No murmur heard. Pulmonary/Chest: Effort normal and breath sounds normal. No respiratory distress. No wheezes.No rales.  Abdominal: Soft. Bowel sounds are normal. No distension. There is no tenderness. There is no guarding.  Musculoskeletal: No edema or tenderness.  Lymphadenopathy: No cervical, axillary or supraclavicular adenopathy.  Neurological:  Alert and oriented to person, place, and time. No cranial nerve deficit.  Skin: Skin is warm and dry. No rash noted. No erythema. No pallor. Minor spider veins noted on LE.   Psychiatric: Affect and judgment normal.   Labs Appointment on 01/13/2019  Component Date Value Ref Range Status  . Ferritin 01/13/2019 169  11 - 307 ng/mL Final   Performed at [facility], [place] [address] [place], [place] [address]  . LDH 01/13/2019 143  98 - 192 U/L Final   Performed at [facility], [place] [address] [place], [place] [address]  . Sodium 01/13/2019 140  135 - 145 mmol/L Final  . Potassium 01/13/2019 3.9  3.5 - 5.1 mmol/L Final  . Chloride 01/13/2019 105  98 - 111 mmol/L Final  . CO2 01/13/2019 26  22 - 32 mmol/L Final  . Glucose, Bld 01/13/2019 95  70 - 99 mg/dL Final  . BUN 01/13/2019 11  8 - 23 mg/dL Final  . Creatinine, Ser 01/13/2019 0.86  0.44 - 1.00 mg/dL Final  . Calcium 01/13/2019 9.5  8.9 - 10.3 mg/dL Final  . Total Protein 01/13/2019 7.6  6.5 - 8.1 g/dL Final  . Albumin 01/13/2019 4.1  3.5 - 5.0 g/dL Final  . AST 01/13/2019 24  15 - 41 U/L Final  . ALT 01/13/2019 21  0 - 44 U/L Final  . Alkaline Phosphatase 01/13/2019 61  38 - 126 U/L Final  . Total Bilirubin 01/13/2019 0.5  0.3 - 1.2 mg/dL Final  . GFR calc non Af Amer 01/13/2019 >60  >60 mL/min Final  . GFR calc Af Amer 01/13/2019 >60  >60 mL/min Final  . Anion gap 01/13/2019 9  5 - 15 Final   Performed at [facility], [place]  [address], [place], [place] [address]  . WBC 01/13/2019 4.5  4.0 - 10.5 K/uL Final  . RBC 01/13/2019 4.19  3.87 - 5.11 MIL/uL Final  . Hemoglobin 01/13/2019 12.7  12.0 - 15.0 g/dL Final  . HCT 01/13/2019 39.3  36.0 - 46.0 % Final  . MCV 01/13/2019 93.8  80.0 - 100.0 fL Final  . MCH 01/13/2019 30.3  26.0 - 34.0 pg Final  . MCHC 01/13/2019 32.3  30.0 - 36.0 g/dL Final  . RDW 01/13/2019 12.4  11.5 - 15.5 % Final  . Platelets 01/13/2019 283  150 - 400 K/uL Final  . nRBC 01/13/2019 0.0  0.0 - 0.2 % Final  . Neutrophils Relative % 01/13/2019 46  % Final  . Neutro Abs 01/13/2019 2.1  1.7 - 7.7 K/uL Final  . Lymphocytes Relative 01/13/2019 37  % Final  . Lymphs Abs 01/13/2019 1.6  0.7 - 4.0 K/uL Final  . Monocytes Relative 01/13/2019 11  % Final  . Monocytes Absolute 01/13/2019 0.5  0.1 - 1.0 K/uL Final  . Eosinophils Relative 01/13/2019 5  % Final  . Eosinophils Absolute 01/13/2019 0.2  0.0 - 0.5 K/uL Final  . Basophils Relative 01/13/2019 1  % Final  . Basophils Absolute 01/13/2019 0.1  0.0 - 0.1 K/uL Final  . Immature Granulocytes 01/13/2019 0  % Final  . Abs Immature Granulocytes 01/13/2019 0.01  0.00 - 0.07 K/uL Final   Performed at [facility], [place] [address] [place], [place] [address]     Pathology Orders Placed This Encounter  Procedures  . CBC with Differential (Cancer Center Only)    Standing Status:   Future  Standing Expiration Date:   01/13/2020  . CMP ([place] only)    Standing Status:   Future    Standing Expiration Date:   01/13/2020  . Lactate dehydrogenase (LDH)    Standing Status:   Future    Standing Expiration Date:   01/13/2020  . Lupus anticoagulant panel*    Standing Status:   Future    Standing Expiration Date:   01/13/2020  . Ambulatory referral to Cardiology    Referral Priority:   Routine    Referral Type:   Consultation    Referral Reason:   Specialty Services Required    Requested Specialty:   Cardiology    Number of  Visits Requested:   1       Zoila Shutter MD

## 2019-01-13 NOTE — Telephone Encounter (Signed)
Scheduled lab per los. Will scheduled f/u once template opens up. Mailed printout

## 2019-01-13 NOTE — Telephone Encounter (Signed)
Please notify patient that I do not generally prescribe nabumetone but we can discuss this in greater detail during her upcoming visit.

## 2019-01-14 NOTE — Telephone Encounter (Signed)
Left VM letting pt know Kate's comments regarding med

## 2019-01-16 LAB — LUPUS ANTICOAGULANT PANEL
DRVVT: 123.8 s — ABNORMAL HIGH (ref 0.0–47.0)
PTT Lupus Anticoagulant: 51.1 s (ref 0.0–51.9)

## 2019-01-16 LAB — DRVVT CONFIRM: dRVVT Confirm: 1.9 ratio — ABNORMAL HIGH (ref 0.8–1.2)

## 2019-01-16 LAB — DRVVT MIX: dRVVT Mix: 74.2 s — ABNORMAL HIGH (ref 0.0–47.0)

## 2019-01-25 ENCOUNTER — Other Ambulatory Visit (INDEPENDENT_AMBULATORY_CARE_PROVIDER_SITE_OTHER): Payer: Medicare Other

## 2019-01-25 ENCOUNTER — Other Ambulatory Visit: Payer: Medicare Other

## 2019-01-25 ENCOUNTER — Other Ambulatory Visit: Payer: Self-pay | Admitting: Primary Care

## 2019-01-25 DIAGNOSIS — Z0000 Encounter for general adult medical examination without abnormal findings: Secondary | ICD-10-CM | POA: Diagnosis not present

## 2019-01-25 DIAGNOSIS — E785 Hyperlipidemia, unspecified: Secondary | ICD-10-CM | POA: Diagnosis not present

## 2019-01-25 DIAGNOSIS — E0500 Thyrotoxicosis with diffuse goiter without thyrotoxic crisis or storm: Secondary | ICD-10-CM | POA: Diagnosis not present

## 2019-01-25 DIAGNOSIS — Z1159 Encounter for screening for other viral diseases: Secondary | ICD-10-CM | POA: Diagnosis not present

## 2019-01-26 LAB — LIPID PANEL
Cholesterol: 161 mg/dL (ref 0–200)
HDL: 59.2 mg/dL (ref >39.00)
LDL Cholesterol: 66 mg/dL (ref 0–99)
NonHDL: 101.99
Total CHOL/HDL Ratio: 3
Triglycerides: 179 mg/dL — ABNORMAL HIGH (ref 0.0–149.0)
VLDL: 35.8 mg/dL (ref 0.0–40.0)

## 2019-01-26 LAB — COMPREHENSIVE METABOLIC PANEL
ALT: 14 U/L (ref 0–35)
AST: 19 U/L (ref 0–37)
Albumin: 4.5 g/dL (ref 3.5–5.2)
Alkaline Phosphatase: 52 U/L (ref 39–117)
BUN: 11 mg/dL (ref 6–23)
CO2: 28 mEq/L (ref 19–32)
Calcium: 9.7 mg/dL (ref 8.4–10.5)
Chloride: 102 mEq/L (ref 96–112)
Creatinine, Ser: 0.78 mg/dL (ref 0.40–1.20)
GFR: 72.4 mL/min (ref >60.00)
Glucose, Bld: 102 mg/dL — ABNORMAL HIGH (ref 70–99)
Potassium: 4.1 mEq/L (ref 3.5–5.1)
Sodium: 138 mEq/L (ref 135–145)
Total Bilirubin: 0.5 mg/dL (ref 0.2–1.2)
Total Protein: 7.2 g/dL (ref 6.0–8.3)

## 2019-01-26 LAB — HEPATITIS C ANTIBODY
Hepatitis C Ab: NONREACTIVE
SIGNAL TO CUT-OFF: 0.02 (ref <1.00)

## 2019-01-26 LAB — TSH: TSH: 1.83 u[IU]/mL (ref 0.35–4.50)

## 2019-01-27 ENCOUNTER — Telehealth: Payer: Self-pay | Admitting: *Deleted

## 2019-01-27 NOTE — Telephone Encounter (Signed)
Spoke with pt and informed her of Dr. Walden Field' instructions -  Complete the current course of Xarelto, then stop. Pt to recheck lab OFF Xarelto in October.  Gave pt appt date and time. Spoke with [facility] for cardiology referral by Dr. Walden Field on 6/24.  Pt will be contacted with appt.

## 2019-01-27 NOTE — Telephone Encounter (Signed)
Pt called requesting to hear from Dr. Walden Field whether she could go off Xarelto.  Pt saw Dr. Walden Field on 6/24 and was told she will be contacted with further instructions by MD. Pt's   Phone     [phone].

## 2019-01-27 NOTE — Telephone Encounter (Signed)
Called pt and left message requesting a call back to nurse for Dr. Walden Field' instructions below.

## 2019-01-29 DIAGNOSIS — H401133 Primary open-angle glaucoma, bilateral, severe stage: Secondary | ICD-10-CM | POA: Diagnosis not present

## 2019-01-29 DIAGNOSIS — H04123 Dry eye syndrome of bilateral lacrimal glands: Secondary | ICD-10-CM | POA: Diagnosis not present

## 2019-02-01 ENCOUNTER — Other Ambulatory Visit: Payer: Self-pay

## 2019-02-01 ENCOUNTER — Encounter: Payer: Self-pay | Admitting: *Deleted

## 2019-02-01 ENCOUNTER — Encounter: Payer: Self-pay | Admitting: Primary Care

## 2019-02-01 ENCOUNTER — Ambulatory Visit (INDEPENDENT_AMBULATORY_CARE_PROVIDER_SITE_OTHER): Payer: Medicare Other | Admitting: Primary Care

## 2019-02-01 VITALS — BP 118/78 | HR 63 | Temp 98.0°F | Ht 66.5 in | Wt 208.5 lb

## 2019-02-01 DIAGNOSIS — H409 Unspecified glaucoma: Secondary | ICD-10-CM

## 2019-02-01 DIAGNOSIS — I10 Essential (primary) hypertension: Secondary | ICD-10-CM

## 2019-02-01 DIAGNOSIS — D229 Melanocytic nevi, unspecified: Secondary | ICD-10-CM | POA: Diagnosis not present

## 2019-02-01 DIAGNOSIS — R739 Hyperglycemia, unspecified: Secondary | ICD-10-CM | POA: Diagnosis not present

## 2019-02-01 DIAGNOSIS — F411 Generalized anxiety disorder: Secondary | ICD-10-CM | POA: Diagnosis not present

## 2019-02-01 DIAGNOSIS — M150 Primary generalized (osteo)arthritis: Secondary | ICD-10-CM | POA: Diagnosis not present

## 2019-02-01 DIAGNOSIS — M8580 Other specified disorders of bone density and structure, unspecified site: Secondary | ICD-10-CM | POA: Diagnosis not present

## 2019-02-01 DIAGNOSIS — I82532 Chronic embolism and thrombosis of left popliteal vein: Secondary | ICD-10-CM

## 2019-02-01 DIAGNOSIS — E785 Hyperlipidemia, unspecified: Secondary | ICD-10-CM

## 2019-02-01 DIAGNOSIS — M159 Polyosteoarthritis, unspecified: Secondary | ICD-10-CM

## 2019-02-01 LAB — POCT GLYCOSYLATED HEMOGLOBIN (HGB A1C): Hemoglobin A1C: 5.5 % (ref 4.0–5.6)

## 2019-02-01 NOTE — Assessment & Plan Note (Signed)
Compliant to calcium and vitamin D.  Bone density scan due in 2021.

## 2019-02-01 NOTE — Assessment & Plan Note (Signed)
LDL under good control with atorvastatin on recent labs.  Continue current regimen.

## 2019-02-01 NOTE — Patient Instructions (Addendum)
Please notify me of your Shingles vaccination date.  Continue taking calcium with vitamin D daily.  Continue exercising. You should be getting 150 minutes of exercise weekly.  It's important to eat a healthy diet by reducing consumption of fast food, fried food, processed snack foods, sugary drinks. Increase consumption of fresh vegetables and fruits, whole grains, water.  Ensure you are drinking 64 ounces of water daily.  Stop by the lab prior to leaving today. I will notify you of your results once received.   It was a pleasure to see you today!

## 2019-02-01 NOTE — Assessment & Plan Note (Signed)
Now off of Xaralto per hematology. Continue off for now. No recurrent DVT.

## 2019-02-01 NOTE — Assessment & Plan Note (Signed)
Following with ophthalmology, no changes.  Continue current regimen.

## 2019-02-01 NOTE — Assessment & Plan Note (Signed)
Doing well on fluoxetine 10 mg, continue same. Denies SI/HI.

## 2019-02-01 NOTE — Assessment & Plan Note (Signed)
Chronic, managed on Tylenol. Discussed use of Diclofenac Gel, regular exercise, healthy weight.

## 2019-02-01 NOTE — Assessment & Plan Note (Signed)
Compliant to amlodipine-benazepril daily. BP under good control on current regimen. Continue same.

## 2019-02-01 NOTE — Progress Notes (Signed)
Subjective:    Patient ID: Marie Ruiz, female    DOB: 1946/07/21, 73 y.o.   MRN: [id number]  HPI  Marie Ruiz is a 73 year old female who presents today for [place] Part 2. She saw our health advisor last month.    Immunizations: -Tetanus: Completed in 2017 -Influenza: Due this season  -Pneumonia: Completed last in 2017 -Shingles: UTD per patient  Colonoscopy: Due, scheduled per GI. Mammogram: Completed in December 2019 Dexa: Completed in December 2019 Hep C Screen: Negative in 2020  BP Readings from Last 3 Encounters:  02/01/19 118/78  01/13/19 115/72  12/22/18 122/80     Review of Systems  Constitutional: Negative for unexpected weight change.  HENT: Negative for rhinorrhea.   Respiratory: Negative for cough and shortness of breath.   Cardiovascular: Negative for chest pain.  Gastrointestinal: Negative for constipation and diarrhea.  Genitourinary: Negative for difficulty urinating.  Musculoskeletal: Negative for arthralgias and myalgias.       Chronic arthritis pain, overall stable.   Skin: Negative for rash.  Allergic/Immunologic: Negative for environmental allergies.  Neurological: Negative for dizziness, numbness and headaches.  Psychiatric/Behavioral:       Feels well managed on fluoxetine.       Past Medical History:  Diagnosis Date  . Anxiety   . Arthritis   . Blood clotting disorder ([place])   . Bone spur    with excision  . Chickenpox   . Colon polyps   . DVT (deep venous thrombosis) ([place])   . Glaucoma    both  . Graves disease 03/30/2013  . History of blood clots 10/2017  . Hyperlipemia   . Hypertension   . Osteopenia   . Prediabetes   . Restless leg syndrome      Social History   Socioeconomic History  . Marital status: Married    Spouse name: Not on file  . Number of children: Not on file  . Years of education: Not on file  . Highest education level: Not on file  Occupational History  . Occupation: Retired  Scientific laboratory technician   . Financial resource strain: Not on file  . Food insecurity    Worry: Not on file    Inability: Not on file  . Transportation needs    Medical: Not on file    Non-medical: Not on file  Tobacco Use  . Smoking status: Former Smoker    Quit date: 1990    Years since quitting: 30.5  . Smokeless tobacco: Never Used  Substance and Sexual Activity  . Alcohol use: Yes    Alcohol/week: 2.0 standard drinks    Types: 2 Standard drinks or equivalent per week  . Drug use: Not Currently  . Sexual activity: Not Currently  Lifestyle  . Physical activity    Days per week: Not on file    Minutes per session: Not on file  . Stress: Not on file  Relationships  . Social Herbalist on phone: Not on file    Gets together: Not on file    Attends religious service: Not on file    Active member of club or organization: Not on file    Attends meetings of clubs or organizations: Not on file    Relationship status: Not on file  . Intimate partner violence    Fear of current or ex partner: Not on file    Emotionally abused: Not on file    Physically abused: Not on file  Forced sexual activity: Not on file  Other Topics Concern  . Not on file  Social History Narrative   Lives in [place], moving to [place].   Retired.   Married.     Past Surgical History:  Procedure Laterality Date  . BREAST CYST ASPIRATION    . CATARACT EXTRACTION W/ INTRAOCULAR LENS IMPLANT Right 2015  . CATARACT EXTRACTION W/PHACO Left 2017  . COLONOSCOPY  2014  . DILATION AND CURETTAGE OF UTERUS      Family History  Problem Relation Age of Onset  . Heart disease Mother   . Heart attack Father   . Breast cancer Sister   . Colon polyps Sister   . Aneurysm Brother   . Multiple myeloma Sister        found in stage 4  . Esophageal cancer Neg Hx   . Colon cancer Neg Hx     Allergies  Allergen Reactions  . Codeine   . Prednisone     Depression    Current Outpatient Medications on File Prior to Visit   Medication Sig Dispense Refill  . acetaminophen (TYLENOL) 500 MG tablet Take 500 mg by mouth as needed for mild pain.    Marland Kitchen amLODipine-benazepril (LOTREL) 5-20 MG capsule Take 1 capsule by mouth daily. 30 capsule 3  . atorvastatin (LIPITOR) 20 MG tablet Take 20 mg by mouth daily.    Marland Kitchen b complex vitamins capsule Take 1 capsule by mouth daily.    . Calcium Carbonate-Vit D-Min (CALCIUM 1200 PO) Take by mouth.    . cetirizine (ZYRTEC) 10 MG tablet Take 10 mg by mouth daily.    . cholecalciferol (VITAMIN D) 1000 units tablet Take 1,000 Units by mouth daily.    . Coenzyme Q10 (COQ10 PO) Take 300 mg by mouth daily.    . diclofenac sodium (VOLTAREN) 1 % GEL APPLY 2 GRAMS TO AFFECTED AREAS 4 TIMES DAILY AS NEEDED 100 g 0  . dorzolamide-timolol (COSOPT) 22.3-6.8 MG/ML ophthalmic solution Place 1 drop into both eyes 2 (two) times daily.    Marland Kitchen FLUoxetine (PROZAC) 10 MG capsule Take 1 capsule by mouth once daily for anxiety. 3 capsule 0  . latanoprost (XALATAN) 0.005 % ophthalmic solution Place 1 drop into both eyes at bedtime.    . Multiple Vitamin (MULTIVITAMIN) tablet Take 1 tablet by mouth daily.    . Olopatadine HCl (PAZEO) 0.7 % SOLN Apply 1 drop to eye daily.    . Omega-3 Fatty Acids (FISH OIL) 1000 MG CAPS Take 1,000 mg by mouth daily.    Marland Kitchen omeprazole (PRILOSEC) 20 MG capsule Take 20 mg by mouth daily.    . rivaroxaban (XARELTO) 20 MG TABS tablet Take 1 tablet (20 mg total) by mouth daily with supper. 30 tablet 0  . trolamine salicylate (ASPERCREME) 10 % cream Apply 1 application topically as needed for muscle pain.     No current facility-administered medications on file prior to visit.     BP 118/78   Pulse 63   Temp 98 F (36.7 C) (Temporal)   Ht 5' 6.5" (1.689 m)   Wt 208 lb 8 oz (94.6 kg)   SpO2 98%   BMI 33.15 kg/m    Objective:   Physical Exam  Constitutional: She is oriented to person, place, and time. She appears well-nourished.  HENT:  Mouth/Throat: No oropharyngeal exudate.   Eyes: Pupils are equal, round, and reactive to light. EOM are normal.  Neck: Neck supple. No thyromegaly present.  Cardiovascular: Normal rate  and regular rhythm.  Respiratory: Effort normal and breath sounds normal.  GI: Soft. Bowel sounds are normal. There is no abdominal tenderness.  Musculoskeletal: Normal range of motion.  Neurological: She is alert and oriented to person, place, and time.  Skin: Skin is warm and dry.  Psychiatric: She has a normal mood and affect.           Assessment & Plan:

## 2019-02-01 NOTE — Assessment & Plan Note (Signed)
Following with dermatology 

## 2019-02-05 ENCOUNTER — Other Ambulatory Visit (RURAL_HEALTH_CENTER): Payer: Self-pay | Admitting: Family Medicine

## 2019-02-05 DIAGNOSIS — I10 Essential (primary) hypertension: Secondary | ICD-10-CM

## 2019-02-11 ENCOUNTER — Telehealth: Payer: Self-pay | Admitting: Internal Medicine

## 2019-02-11 NOTE — Telephone Encounter (Signed)
LMTCB to let us know if pt would like sooner OV with Dr. Margaretann Loveless on 7/29. Pt is currently scheduled on 8/11.

## 2019-02-17 ENCOUNTER — Telehealth: Payer: Self-pay | Admitting: *Deleted

## 2019-02-17 NOTE — Telephone Encounter (Signed)

## 2019-02-19 NOTE — Telephone Encounter (Signed)
Called pt and offered to move virtual appointment up to 8/3. Pt agreeable. 

## 2019-02-22 ENCOUNTER — Encounter: Payer: Self-pay | Admitting: Internal Medicine

## 2019-02-22 ENCOUNTER — Telehealth (INDEPENDENT_AMBULATORY_CARE_PROVIDER_SITE_OTHER): Payer: Medicare Other | Admitting: Internal Medicine

## 2019-02-22 VITALS — BP 113/78 | HR 68 | Ht 66.0 in | Wt 205.0 lb

## 2019-02-22 DIAGNOSIS — I712 Thoracic aortic aneurysm, without rupture, unspecified: Secondary | ICD-10-CM

## 2019-02-22 DIAGNOSIS — I82432 Acute embolism and thrombosis of left popliteal vein: Secondary | ICD-10-CM

## 2019-02-22 DIAGNOSIS — I700 Atherosclerosis of aorta: Secondary | ICD-10-CM

## 2019-02-22 DIAGNOSIS — E785 Hyperlipidemia, unspecified: Secondary | ICD-10-CM | POA: Diagnosis not present

## 2019-02-22 DIAGNOSIS — I10 Essential (primary) hypertension: Secondary | ICD-10-CM | POA: Diagnosis not present

## 2019-02-22 MED ORDER — ATORVASTATIN CALCIUM 40 MG PO TABS: 40.0000 mg | ORAL_TABLET | Freq: Every day | ORAL | 3 refills | Status: DC

## 2019-02-22 NOTE — Progress Notes (Signed)
Virtual Visit via Video Note   This visit type was conducted due to national recommendations for restrictions regarding the COVID-19 Pandemic (e.g. social distancing) in an effort to limit this patient's exposure and mitigate transmission in our community.  Due to her co-morbid illnesses, this patient is at least at moderate risk for complications without adequate follow up.  This format is felt to be most appropriate for this patient at this time.  All issues noted in this document were discussed and addressed.  A limited physical exam was performed with this format.  Please refer to the patient's chart for her consent to telehealth for [facility].   Date:  02/22/2019   ID:  Marie Ruiz, DOB [birth year], MRN [id number]  Patient Location: Home Provider Location: Home  PCP:  Pleas [name], NP  Cardiologist:  No primary care provider on file.  Electrophysiologist:  None   Evaluation Performed:  New Patient Evaluation  Chief Complaint:  Ascending aorta dilation, aortic atherosclerosis.  History of Present Illness:    Marie Ruiz is a 73 y.o. female with DVT with completed anticoagulation, HTN, HLD who presents today for review of aortic atherosclerosis and ascending aorta dilation seen on recent CT chest obtained for history of DVT and unintentional weight loss.  Her aorta measures 4.1 cm on recent CT, and she is noted to have aortic atherosclerosis. I have independently reviewed these images and have shared them with the patient via our virtual visit.   She has previously had a DVT in the left leg and has completed Xarelto therapy per her hematologist and PCP notes.   She has no known family history aortic aneurysm or rupture. No family history of sudden cardiac death or early MI.   The patient denies chest pain, chest pressure, dyspnea at rest or with exertion, palpitations, PND, orthopnea, or leg swelling. Denies syncope or presyncope. Denies dizziness or  lightheadedness. Denies snoring and has not been evaluated for sleep apnea.  The patient does not have symptoms concerning for COVID-19 infection (fever, chills, cough, or new shortness of breath).    Past Medical History:  Diagnosis Date  . Anxiety   . Arthritis   . Blood clotting disorder ([place])   . Bone spur    with excision  . Chickenpox   . Colon polyps   . DVT (deep venous thrombosis) ([place])   . Glaucoma    both  . Graves disease 03/30/2013  . History of blood clots 10/2017  . Hyperlipemia   . Hypertension   . Osteopenia   . Prediabetes   . Restless leg syndrome    Past Surgical History:  Procedure Laterality Date  . BREAST CYST ASPIRATION    . CATARACT EXTRACTION W/ INTRAOCULAR LENS IMPLANT Right 2015  . CATARACT EXTRACTION W/PHACO Left 2017  . COLONOSCOPY  2014  . DILATION AND CURETTAGE OF UTERUS       Current Meds  Medication Sig  . acetaminophen (TYLENOL) 500 MG tablet Take 500 mg by mouth as needed for mild pain.  Marland Kitchen amLODipine-benazepril (LOTREL) 5-20 MG capsule Take 1 capsule by mouth daily.  Marland Kitchen atorvastatin (LIPITOR) 20 MG tablet Take 20 mg by mouth daily.  Marland Kitchen b complex vitamins capsule Take 1 capsule by mouth daily.  . Calcium Carbonate-Vit D-Min (CALCIUM 1200 PO) Take by mouth.  . cetirizine (ZYRTEC) 10 MG tablet Take 10 mg by mouth daily.  . cholecalciferol (VITAMIN D) 1000 units tablet Take 1,000 Units by mouth daily.  Marland Kitchen  Coenzyme Q10 (COQ10 PO) Take 300 mg by mouth daily.  . diclofenac sodium (VOLTAREN) 1 % GEL APPLY 2 GRAMS TO AFFECTED AREAS 4 TIMES DAILY AS NEEDED  . dorzolamide-timolol (COSOPT) 22.3-6.8 MG/ML ophthalmic solution Place 1 drop into both eyes 2 (two) times daily.  Marland Kitchen FLUoxetine (PROZAC) 10 MG capsule Take 1 capsule by mouth once daily for anxiety.  Marland Kitchen latanoprost (XALATAN) 0.005 % ophthalmic solution Place 1 drop into both eyes at bedtime.  . Multiple Vitamin (MULTIVITAMIN) tablet Take 1 tablet by mouth daily.  . Olopatadine HCl (PAZEO) 0.7  % SOLN Apply 1 drop to eye daily.  . Omega-3 Fatty Acids (FISH OIL) 1000 MG CAPS Take 1,000 mg by mouth daily.  Marland Kitchen omeprazole (PRILOSEC) 20 MG capsule Take 20 mg by mouth daily.  Marland Kitchen trolamine salicylate (ASPERCREME) 10 % cream Apply 1 application topically as needed for muscle pain.     Allergies:   Codeine and Prednisone   Social History   Tobacco Use  . Smoking status: Former Smoker    Quit date: 1990    Years since quitting: 30.6  . Smokeless tobacco: Never Used  Substance Use Topics  . Alcohol use: Yes    Alcohol/week: 2.0 standard drinks    Types: 2 Standard drinks or equivalent per week  . Drug use: Not Currently     Family Hx: The patient's family history includes Aneurysm in her brother; Breast cancer in her sister; Colon polyps in her sister; Heart attack in her father; Heart disease in her mother; Multiple myeloma in her sister. There is no history of Esophageal cancer or Colon cancer.  ROS:   Please see the history of present illness.     All other systems reviewed and are negative.   Prior CV studies:   The following studies were reviewed today:    Labs/Other Tests and Data Reviewed:    EKG:  No ECG reviewed.  Recent Labs: 01/13/2019: Hemoglobin 12.7; Platelets 283 01/25/2019: ALT 14; BUN 11; Creatinine, Ser 0.78; Potassium 4.1; Sodium 138; TSH 1.83   Recent Lipid Panel Lab Results  Component Value Date/Time   CHOL 161 01/25/2019 09:00 AM   TRIG 179.0 (H) 01/25/2019 09:00 AM   HDL 59.20 01/25/2019 09:00 AM   CHOLHDL 3 01/25/2019 09:00 AM   LDLCALC 66 01/25/2019 09:00 AM    Wt Readings from Last 3 Encounters:  02/22/19 205 lb (93 kg)  02/01/19 208 lb 8 oz (94.6 kg)  01/13/19 209 lb 9.6 oz (95.1 kg)     Objective:    Vital Signs:  BP 113/78   Pulse 68   Ht 5' 6"  (1.676 m)   Wt 205 lb (93 kg)   BMI 33.09 kg/m    VITAL SIGNS:  reviewed GEN:  no acute distress EYES:  sclerae anicteric, EOMI - Extraocular Movements Intact RESPIRATORY:  normal  respiratory effort, symmetric expansion CARDIOVASCULAR:  no peripheral edema SKIN:  no rash, lesions or ulcers. MUSCULOSKELETAL:  no obvious deformities. NEURO:  alert and oriented x 3, no obvious focal deficit PSYCH:  normal affect  ASSESSMENT & PLAN:    1. Thoracic aortic aneurysm without rupture ([place])   2. Essential hypertension   3. Deep vein thrombosis (DVT) of popliteal vein of left lower extremity, unspecified chronicity (HCC)   4. Hyperlipidemia, unspecified hyperlipidemia type   5. Aortic atherosclerosis (HCC)    TAA - she has mild dilation of the ascending on a non-dedicated chest study. We discussed this in detail today. She will  need a CTA of her aorta to ensure we appropriately characterize her aorta dilation. We discussed weight lifting restrictions (if you have to strain to lift, avoid). Can consider addition of losartan for HTN control if needed for mitigation of aortic dilation as losartan has been suggested to slow progression of dilation.   Aortic atherosclerosis - this is trivial to mild, however we discussed aggressive secondary prevention of atherosclerosis, and will uptitrate her statin to 40 mg of atorvastatin today. If she tolerates this well we can continue. She will let us know.  HLD - well controlled per PCP. Will increase atorvastatin for benefit of atherosclerosis of aorta.   HTN - well controlled at this time, no change to amlodipine-benazepril therapy.  COVID-19 Education: The signs and symptoms of COVID-19 were discussed with the patient and how to seek care for testing (follow up with PCP or arrange E-visit).  The importance of social distancing was discussed today.  Time:   Today, I have spent 45 minutes with the patient with telehealth technology discussing the above problems.     Medication Adjustments/Labs and Tests Ordered: Current medicines are reviewed at length with the patient today.  Concerns regarding medicines are outlined above.   Tests  Ordered: No orders of the defined types were placed in this encounter.   Medication Changes: No orders of the defined types were placed in this encounter.   Follow Up:  6 mo  Signed, Elouise Munroe, MD  02/22/2019 11:57 AM    [place]

## 2019-02-22 NOTE — Patient Instructions (Addendum)
Medication Instructions:  Dr Margaretann Loveless has recommended making the following medication changes: 1. INCREASE Atorvastatin to 40 mg daily  If you need a refill on your cardiac medications before your next appointment, please call your pharmacy.   Lab work: Your physician recommends that you return for lab work prior to your testing.  If you have labs (blood work) drawn today and your tests are completely normal, you will receive your results only by: Marland Kitchen MyChart Message (if you have MyChart) OR . A paper copy in the mail If you have any lab test that is abnormal or we need to change your treatment, we will call you to review the results.  Testing/Procedures: 1. CTA Aorta - Non-Cardiac CT scanning, (CAT scanning), is a noninvasive, special x-ray that produces cross-sectional images of the body using x-rays and a computer. CT scans help physicians diagnose and treat medical conditions. For some CT exams, a contrast material is used to enhance visibility in the area of the body being studied. CT scans provide greater clarity and reveal more details than regular x-ray exams.  >> This will be performed at our [facility] location [place], [place] [address] [phone]  Follow-Up: At [facility], you and your health needs are our priority.  As part of our continuing mission to provide you with exceptional heart care, we have created designated Provider Care Teams.  These Care Teams include your primary Cardiologist (physician) and Advanced Practice Providers (APPs -  Physician Assistants and Nurse Practitioners) who all work together to provide you with the care you need, when you need it. You will need a follow up appointment in 6 months.  Please call our office 2 months in advance to schedule this appointment.  You may see Elouise Munroe, MD or one of the following Advanced Practice Providers on your designated Care Team:   Rosaria Ferries, PA-C . Jory Sims, DNP,  ANP . You will receive a reminder letter in the mail two months in advance. If you don't receive a letter, please call our office to schedule the follow-up appointment.

## 2019-02-24 ENCOUNTER — Telehealth: Payer: Self-pay

## 2019-02-24 NOTE — Telephone Encounter (Signed)
Pt left v/m; pt had annual visit on 01/31/69 and was to cb with shingles vaccine date  Shingles vaccine done 01/2013. (did not give specfic day). V/m also included pneumonia vaccine 01/2016 and tetanus 11/22/2011. Tdap listed on immunization list as 06/19/2016? FYI to [name] NP and Vallarie Mare CMA.

## 2019-02-24 NOTE — Telephone Encounter (Signed)
Noted. I have added the shingles to patient's chart

## 2019-03-02 ENCOUNTER — Telehealth: Payer: Medicare Other | Admitting: Internal Medicine

## 2019-03-07 ENCOUNTER — Other Ambulatory Visit (RURAL_HEALTH_CENTER): Payer: Self-pay | Admitting: Family Medicine

## 2019-03-12 ENCOUNTER — Other Ambulatory Visit: Payer: Self-pay | Admitting: Primary Care

## 2019-03-12 DIAGNOSIS — M159 Polyosteoarthritis, unspecified: Secondary | ICD-10-CM

## 2019-03-31 ENCOUNTER — Other Ambulatory Visit: Payer: Self-pay

## 2019-03-31 ENCOUNTER — Other Ambulatory Visit: Payer: Medicare Other | Admitting: *Deleted

## 2019-03-31 ENCOUNTER — Encounter (INDEPENDENT_AMBULATORY_CARE_PROVIDER_SITE_OTHER): Payer: Self-pay

## 2019-03-31 DIAGNOSIS — I712 Thoracic aortic aneurysm, without rupture: Secondary | ICD-10-CM | POA: Diagnosis not present

## 2019-03-31 LAB — BASIC METABOLIC PANEL
BUN/Creatinine Ratio: 11 — ABNORMAL LOW (ref 12–28)
BUN: 8 mg/dL (ref 8–27)
CO2: 23 mmol/L (ref 20–29)
Calcium: 10.3 mg/dL (ref 8.7–10.3)
Chloride: 101 mmol/L (ref 96–106)
Creatinine, Ser: 0.7 mg/dL (ref 0.57–1.00)
GFR calc Af Amer: 99 mL/min/{1.73_m2} (ref >59)
GFR calc non Af Amer: 86 mL/min/{1.73_m2} (ref >59)
Glucose: 109 mg/dL — ABNORMAL HIGH (ref 65–99)
Potassium: 4.3 mmol/L (ref 3.5–5.2)
Sodium: 135 mmol/L (ref 134–144)

## 2019-04-06 ENCOUNTER — Ambulatory Visit (INDEPENDENT_AMBULATORY_CARE_PROVIDER_SITE_OTHER)
Admission: RE | Admit: 2019-04-06 | Discharge: 2019-04-06 | Disposition: A | Discharge status: Home / Self Care | Payer: Medicare Other | Source: Ambulatory Visit | Attending: Internal Medicine | Admitting: Internal Medicine

## 2019-04-06 ENCOUNTER — Other Ambulatory Visit: Payer: Self-pay

## 2019-04-06 DIAGNOSIS — I712 Thoracic aortic aneurysm, without rupture, unspecified: Secondary | ICD-10-CM

## 2019-04-06 MED ORDER — IOHEXOL 350 MG/ML SOLN
100.0000 mL | Freq: Once | INTRAVENOUS | Status: AC | PRN
  Administered 2019-04-06: 100 mL via INTRAVENOUS

## 2019-04-19 ENCOUNTER — Telehealth: Payer: Self-pay | Admitting: Hematology

## 2019-04-19 NOTE — Telephone Encounter (Signed)
[facility] transfer to [place]. confimred with patient October lab and follow up with [facility].

## 2019-05-14 ENCOUNTER — Other Ambulatory Visit: Payer: Self-pay

## 2019-05-14 ENCOUNTER — Inpatient Hospital Stay: Payer: Medicare Other | Attending: Hematology

## 2019-05-14 DIAGNOSIS — M1990 Unspecified osteoarthritis, unspecified site: Secondary | ICD-10-CM | POA: Insufficient documentation

## 2019-05-14 DIAGNOSIS — I82532 Chronic embolism and thrombosis of left popliteal vein: Secondary | ICD-10-CM

## 2019-05-14 DIAGNOSIS — E785 Hyperlipidemia, unspecified: Secondary | ICD-10-CM | POA: Insufficient documentation

## 2019-05-14 DIAGNOSIS — H409 Unspecified glaucoma: Secondary | ICD-10-CM | POA: Insufficient documentation

## 2019-05-14 DIAGNOSIS — Z79899 Other long term (current) drug therapy: Secondary | ICD-10-CM | POA: Insufficient documentation

## 2019-05-14 DIAGNOSIS — I10 Essential (primary) hypertension: Secondary | ICD-10-CM | POA: Insufficient documentation

## 2019-05-14 DIAGNOSIS — F419 Anxiety disorder, unspecified: Secondary | ICD-10-CM | POA: Diagnosis not present

## 2019-05-14 DIAGNOSIS — Z7901 Long term (current) use of anticoagulants: Secondary | ICD-10-CM | POA: Diagnosis not present

## 2019-05-14 DIAGNOSIS — G2581 Restless legs syndrome: Secondary | ICD-10-CM | POA: Insufficient documentation

## 2019-05-14 DIAGNOSIS — Z86718 Personal history of other venous thrombosis and embolism: Secondary | ICD-10-CM | POA: Diagnosis not present

## 2019-05-14 DIAGNOSIS — I700 Atherosclerosis of aorta: Secondary | ICD-10-CM

## 2019-05-14 LAB — CMP (CANCER CENTER ONLY)
ALT: 24 U/L (ref 0–44)
AST: 23 U/L (ref 15–41)
Albumin: 4.2 g/dL (ref 3.5–5.0)
Alkaline Phosphatase: 74 U/L (ref 38–126)
Anion gap: 9 (ref 5–15)
BUN: 11 mg/dL (ref 8–23)
CO2: 26 mmol/L (ref 22–32)
Calcium: 9.7 mg/dL (ref 8.9–10.3)
Chloride: 103 mmol/L (ref 98–111)
Creatinine: 0.78 mg/dL (ref 0.44–1.00)
GFR, Est AFR Am: >60 mL/min (ref >60)
GFR, Estimated: >60 mL/min (ref >60)
Glucose, Bld: 99 mg/dL (ref 70–99)
Potassium: 4 mmol/L (ref 3.5–5.1)
Sodium: 138 mmol/L (ref 135–145)
Total Bilirubin: 0.5 mg/dL (ref 0.3–1.2)
Total Protein: 7.4 g/dL (ref 6.5–8.1)

## 2019-05-14 LAB — CBC WITH DIFFERENTIAL (CANCER CENTER ONLY)
Abs Immature Granulocytes: 0.01 10*3/uL (ref 0.00–0.07)
Basophils Absolute: 0.1 10*3/uL (ref 0.0–0.1)
Basophils Relative: 1 %
Eosinophils Absolute: 0.3 10*3/uL (ref 0.0–0.5)
Eosinophils Relative: 5 %
HCT: 40.2 % (ref 36.0–46.0)
Hemoglobin: 13.3 g/dL (ref 12.0–15.0)
Immature Granulocytes: 0 %
Lymphocytes Relative: 30 %
Lymphs Abs: 1.7 10*3/uL (ref 0.7–4.0)
MCH: 31.4 pg (ref 26.0–34.0)
MCHC: 33.1 g/dL (ref 30.0–36.0)
MCV: 94.8 fL (ref 80.0–100.0)
Monocytes Absolute: 0.5 10*3/uL (ref 0.1–1.0)
Monocytes Relative: 8 %
Neutro Abs: 3 10*3/uL (ref 1.7–7.7)
Neutrophils Relative %: 56 %
Platelet Count: 289 10*3/uL (ref 150–400)
RBC: 4.24 MIL/uL (ref 3.87–5.11)
RDW: 12.6 % (ref 11.5–15.5)
WBC Count: 5.5 10*3/uL (ref 4.0–10.5)
nRBC: 0 % (ref 0.0–0.2)

## 2019-05-14 LAB — LACTATE DEHYDROGENASE: LDH: 149 U/L (ref 98–192)

## 2019-05-15 LAB — LUPUS ANTICOAGULANT PANEL
DRVVT: 34.4 s (ref 0.0–47.0)
PTT Lupus Anticoagulant: 40.2 s (ref 0.0–51.9)

## 2019-05-19 ENCOUNTER — Other Ambulatory Visit: Payer: Medicare Other

## 2019-05-21 ENCOUNTER — Inpatient Hospital Stay (HOSPITAL_BASED_OUTPATIENT_CLINIC_OR_DEPARTMENT_OTHER): Payer: Medicare Other | Admitting: Hematology

## 2019-05-21 ENCOUNTER — Other Ambulatory Visit: Payer: Medicare Other

## 2019-05-21 ENCOUNTER — Other Ambulatory Visit: Payer: Self-pay

## 2019-05-21 VITALS — BP 121/80 | HR 76 | Temp 98.3°F | Resp 18 | Ht 66.0 in | Wt 209.5 lb

## 2019-05-21 DIAGNOSIS — E785 Hyperlipidemia, unspecified: Secondary | ICD-10-CM | POA: Diagnosis not present

## 2019-05-21 DIAGNOSIS — I82532 Chronic embolism and thrombosis of left popliteal vein: Secondary | ICD-10-CM

## 2019-05-21 DIAGNOSIS — H409 Unspecified glaucoma: Secondary | ICD-10-CM | POA: Diagnosis not present

## 2019-05-21 DIAGNOSIS — I700 Atherosclerosis of aorta: Secondary | ICD-10-CM | POA: Diagnosis not present

## 2019-05-21 DIAGNOSIS — E0500 Thyrotoxicosis with diffuse goiter without thyrotoxic crisis or storm: Secondary | ICD-10-CM

## 2019-05-21 DIAGNOSIS — Z86718 Personal history of other venous thrombosis and embolism: Secondary | ICD-10-CM | POA: Diagnosis not present

## 2019-05-21 DIAGNOSIS — I10 Essential (primary) hypertension: Secondary | ICD-10-CM | POA: Diagnosis not present

## 2019-05-21 DIAGNOSIS — Z7901 Long term (current) use of anticoagulants: Secondary | ICD-10-CM | POA: Diagnosis not present

## 2019-05-21 NOTE — Progress Notes (Signed)
HEMATOLOGY/ONCOLOGY CONSULTATION NOTE  Date of Service: 05/21/2019  Patient Care Team: Pleas [name], NP as PCP - General (Internal Medicine) Elouise Munroe, MD as PCP - Cardiology (Cardiology)  REFERRING PHYSICIAN: Pleas [name], NP  CHIEF COMPLAINTS/PURPOSE OF CONSULTATION:  Thromboembolic Disease; DVT  HISTORY OF PRESENTING ILLNESS:  Marie Ruiz is a wonderful 73 y.o. female who has been referred to [place] by Pleas [name], NP for evaluation and management of Thromboembolic Disease.  "Historical data obtained from note dated 06/05/2018.  73 yr old female previously followed by Dr. Audelia Hives.   On November 10, 2017 she presented to the emergency department at [facility] complaining of rather sudden onset of pain and swelling of the left lower extremity.  Both she and her husband had relocated in September and were driving back and forth from [place] to [place] and subsequently [place].  She was put on xarelto April 22,2019 and continued using it until July 2020 and started taking baby Asprin.  She still travels long distance but makes sure to take breaks and hydrate.  Pt had LE doppler done 11/10/2017 that showed  Final Interpretation: Right: No evidence of common femoral vein obstruction. Left: There is evidence of acute DVT in the Posterior Tibial vein and tibioperoneal trunk.  She was discharged on rivaroxaban by protocol.   On May 06, 2018 a repeat left lower extremity duplex venous Doppler was performed at [facility]. It was compared with the previous study from November 10, 2017.  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 These findings appear improved from the previous examination.  No cystic structure was found in the popliteal fossa.  Blood flow was identified and complete occlusion was not seen.    Pt gave history of  driving back and forth from [place] to [place] and subsequently [place].  Hypercoagulable work-up showed the presence of a lupus anticoagulant on labs done 04/2018."  She feels fatigue after walking for several minutes and when cooking. She feels that her engery level are not as high as they were in the past.  INTERVAL HISTORY:  Marie Ruiz is a 73 y.o. female here for evaluation and management of DVT. The pt reports that she is doing well overall.   Lab results today( 05/14/19) of CBC w/diff and CMP is as follows: all values are WNL.   On review of systems, pt  denies abdominal pain, back pain and any other symptoms.    MEDICAL HISTORY:  Past Medical History:  Diagnosis Date  . Anxiety   . Arthritis   . Blood clotting disorder ([place])   . Bone spur    with excision  . Chickenpox   . Colon polyps   . DVT (deep venous thrombosis) ([place])   . Glaucoma    both  . Graves disease 03/30/2013  . History of blood clots 10/2017  . Hyperlipemia   . Hypertension   . Osteopenia   . Prediabetes   . Restless leg syndrome      SURGICAL HISTORY: Past Surgical History:  Procedure Laterality Date  . BREAST CYST ASPIRATION    . CATARACT EXTRACTION W/ INTRAOCULAR LENS IMPLANT Right 2015  . CATARACT EXTRACTION W/PHACO Left 2017  . COLONOSCOPY  2014  . DILATION AND CURETTAGE OF UTERUS       SOCIAL HISTORY: Social History   Socioeconomic History  . Marital status:  Married    Spouse name: Not on file  . Number of children: Not on file  . Years of education: Not on file  . Highest education level: Not on file  Occupational History  . Occupation: Retired  Scientific laboratory technician  . Financial resource strain: Not on file  . Food insecurity    Worry: Not on file    Inability: Not on file  . Transportation needs    Medical: Not on file    Non-medical: Not on file  Tobacco Use  . Smoking status: Former Smoker    Quit date: 1990    Years since quitting: 30.8  . Smokeless  tobacco: Never Used  Substance and Sexual Activity  . Alcohol use: Yes    Alcohol/week: 2.0 standard drinks    Types: 2 Standard drinks or equivalent per week  . Drug use: Not Currently  . Sexual activity: Not Currently  Lifestyle  . Physical activity    Days per week: Not on file    Minutes per session: Not on file  . Stress: Not on file  Relationships  . Social Herbalist on phone: Not on file    Gets together: Not on file    Attends religious service: Not on file    Active member of club or organization: Not on file    Attends meetings of clubs or organizations: Not on file    Relationship status: Not on file  . Intimate partner violence    Fear of current or ex partner: Not on file    Emotionally abused: Not on file    Physically abused: Not on file    Forced sexual activity: Not on file  Other Topics Concern  . Not on file  Social History Narrative   Lives in [place], moving to [place].   Retired.   Married.      FAMILY HISTORY: Family History  Problem Relation Age of Onset  . Heart disease Mother   . Heart attack Father   . Breast cancer Sister   . Colon polyps Sister   . Aneurysm Brother   . Multiple myeloma Sister        found in stage 4  . Esophageal cancer Neg Hx   . Colon cancer Neg Hx      ALLERGIES:   is allergic to codeine and prednisone.   MEDICATIONS:  Current Outpatient Medications  Medication Sig Dispense Refill  . acetaminophen (TYLENOL) 500 MG tablet Take 500 mg by mouth as needed for mild pain.    Marland Kitchen amLODipine-benazepril (LOTREL) 5-20 MG capsule Take 1 capsule by mouth daily. 30 capsule 3  . atorvastatin (LIPITOR) 40 MG tablet Take 1 tablet (40 mg total) by mouth daily. 90 tablet 3  . b complex vitamins capsule Take 1 capsule by mouth daily.    . Calcium Carbonate-Vit D-Min (CALCIUM 1200 PO) Take by mouth.    . cetirizine (ZYRTEC) 10 MG tablet Take 10 mg by mouth daily.    . cholecalciferol (VITAMIN D) 1000 units tablet Take  1,000 Units by mouth daily.    . Coenzyme Q10 (COQ10 PO) Take 300 mg by mouth daily.    . diclofenac sodium (VOLTAREN) 1 % GEL APPLY 2 GRAMS TO AFFECTED AREAS 4 TIMES DAILY AS NEEDED 100 g 0  . dorzolamide-timolol (COSOPT) 22.3-6.8 MG/ML ophthalmic solution Place 1 drop into both eyes 2 (two) times daily.    Marland Kitchen FLUoxetine (PROZAC) 10 MG capsule Take 1 capsule by mouth once  daily for anxiety. 3 capsule 0  . latanoprost (XALATAN) 0.005 % ophthalmic solution Place 1 drop into both eyes at bedtime.    . Multiple Vitamin (MULTIVITAMIN) tablet Take 1 tablet by mouth daily.    . Olopatadine HCl (PAZEO) 0.7 % SOLN Apply 1 drop to eye daily.    . Omega-3 Fatty Acids (FISH OIL) 1000 MG CAPS Take 1,000 mg by mouth daily.    Marland Kitchen omeprazole (PRILOSEC) 20 MG capsule Take 20 mg by mouth daily.    Marland Kitchen trolamine salicylate (ASPERCREME) 10 % cream Apply 1 application topically as needed for muscle pain.     No current facility-administered medications for this visit.      REVIEW OF SYSTEMS:   A 10+ POINT REVIEW OF SYSTEMS WAS OBTAINED including neurology, dermatology, psychiatry, cardiac, respiratory, lymph, extremities, GI, GU, Musculoskeletal, constitutional, breasts, reproductive, HEENT.  All pertinent positives are noted in the HPI.  All others are negative.    PHYSICAL EXAMINATION: ECOG PERFORMANCE STATUS: 1 - Symptomatic but completely ambulatory  Vitals:   05/21/19 0948  BP: 121/80  Pulse: 76  Resp: 18  Temp: 98.3 F (36.8 C)  SpO2: 97%   Filed Weights   05/21/19 0948  Weight: 209 lb 8 oz (95 kg)   Body mass index is 33.81 kg/m.  GENERAL:alert, in no acute distress and comfortable SKIN: no acute rashes, no significant lesions EYES: conjunctiva are pink and non-injected, sclera anicteric OROPHARYNX: MMM, no exudates, no oropharyngeal erythema or ulceration NECK: supple, no JVD LYMPH:  no palpable lymphadenopathy in the cervical, axillary or inguinal regions LUNGS: clear to auscultation  b/l with normal respiratory effort HEART: regular rate & rhythm ABDOMEN:  normoactive bowel sounds , non tender, not distended. Extremity: no pedal edema PSYCH: alert & oriented x 3 with fluent speech NEURO: no focal motor/sensory deficits   LABORATORY DATA:  I have reviewed the data as listed  CBC Latest Ref Rng & Units 05/14/2019 01/13/2019 05/20/2018  WBC 4.0 - 10.5 K/uL 5.5 4.5 5.8  Hemoglobin 12.0 - 15.0 g/dL 13.3 12.7 13.9  Hematocrit 36.0 - 46.0 % 40.2 39.3 43.4  Platelets 150 - 400 K/uL 289 283 309    CMP Latest Ref Rng & Units 05/14/2019 03/31/2019 01/25/2019  Glucose 70 - 99 mg/dL 99 109(H) 102(H)  BUN 8 - 23 mg/dL _0 Creatinine 0.44 - 1.00 mg/dL 0.78 0.70 0.78  Sodium 135 - 145 mmol/L 138 135 138  Potassium 3.5 - 5.1 mmol/L 4.0 4.3 4.1  Chloride 98 - 111 mmol/L 103 101 102  CO2 22 - 32 mmol/L _1 Calcium 8.9 - 10.3 mg/dL 9.7 10.3 9.7  Total Protein 6.5 - 8.1 g/dL 7.4 - 7.2  Total Bilirubin 0.3 - 1.2 mg/dL 0.5 - 0.5  Alkaline Phos 38 - 126 U/L 74 - 52  AST 15 - 41 U/L 23 - 19  ALT 0 - 44 U/L 24 - 14   04/06/2019 CT ANGIOGRAPHY CHEST WITH CONTRAST (Accession [id number])    RADIOGRAPHIC STUDIES: I have personally reviewed the radiological images as listed and agreed with the findings in the report. No results found.    ASSESSMENT & PLAN:  Marie Ruiz is a 73 y.o. female with: "1.  Left LE DVT.  73 yr old female previously followed by Dr. Audelia Hives.  Pt was diagnosed with left posterior tibial and peroneal vein in 10/2017.  She has been on Xarelto since 10/2017.    She denies any history of  miscarriages, family history of thrombosis or prior clots.    Pt had repeat left LE doppler done May 06, 2018 a repeat left lower extremity duplex venous Doppler done and it was compared with the previous study from November 10, 2017.  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 These findings appear improved from the previous examination.  No cystic structure was found in the popliteal fossa.  Blood flow was identified and complete occlusion was not seen.   Pt underwent hypercoagulable evaluation on 05/20/2018 that showed negative Factor V Leiden, Prothrombin gene mutation and normal B2 Glycoprotein.  She was found to have a positive lupus anticoagulant.   Risk factory for thrombosis  include frequent travel as pt reported driving fairly long distances with her husband in the process of relocating from [place].  Due to findings of persistent thrombosis noted on doppler in 04/2018 she was recommended to continue anticoagulation.  CT CAP done 09/01/2018 reviewed and showed IMPRESSION: 1. No acute abnormality. 2. No lymphadenopathy or other CT findings to suggest neoplastic disease. 3. Ectatic 4.1 cm ascending thoracic aorta. Recommend annual imaging followup by CTA or MRA.  4. Aortic Atherosclerosis    Bilateral LE doppler done 01/12/2019 showed  Summary: Right: No evidence of common femoral vein obstruction. Left: There is no evidence of deep vein thrombosis in the lower extremity."    PLAN: -Discussed pt labwork today,05/21/19 ; CBC w/diff and CMP is as follows: all values are WNL.  -Discussed that her lupus anticoagulant levels might be affected blood thinner -Advised that her repeated blood counts were normal and so was her lupus anticoagulin  -Advised that she is not anemic and that her blood counts are normal. -Advised to follow up with Cardiologist for fatigue and chest discomfort. -Recommended following up with PCP for other concerns to make sure new protocols do not effect current treatment.  -Advised that her lupus anticoagulin levels are normal and not concerning.  FOLLOW UP RTC with Dr Irene Limbo with labs  All of the patients questions were answered with apparent satisfaction. The patient knows to call the clinic with any  problems, questions or concerns.  I spent 25 minutes counseling the patient face to face. The total time spent in the appointment was 40 minutes and more than 50% was on counseling and direct patient cares.    Sullivan Lone MD MS AAHIVMS [facility] Hematology/Oncology Physician [facility]  (Office):       [phone] (Work cell):  [phone] (Fax):           [phone]  05/21/2019 12:39 AM I, Scot Dock, am acting as a scribe for Dr. Sullivan Lone.   .I have reviewed the above documentation for accuracy and completeness, and I agree with the above. Brunetta Genera MD

## 2019-05-24 ENCOUNTER — Telehealth: Payer: Self-pay | Admitting: Hematology

## 2019-05-24 NOTE — Telephone Encounter (Signed)
Per 10/30 los RTC with Dr Irene Limbo with labs/  Waiting to hear back from the MD on when he wants me to schedule for the patient to come back.

## 2019-06-09 ENCOUNTER — Other Ambulatory Visit: Payer: Self-pay | Admitting: Primary Care

## 2019-06-09 DIAGNOSIS — Z1231 Encounter for screening mammogram for malignant neoplasm of breast: Secondary | ICD-10-CM

## 2019-08-03 ENCOUNTER — Other Ambulatory Visit: Payer: Self-pay

## 2019-08-03 ENCOUNTER — Ambulatory Visit
Admission: RE | Admit: 2019-08-03 | Discharge: 2019-08-03 | Disposition: A | Discharge status: Home / Self Care | Payer: Medicare Other | Source: Ambulatory Visit | Attending: Primary Care | Admitting: Primary Care

## 2019-08-03 DIAGNOSIS — Z1231 Encounter for screening mammogram for malignant neoplasm of breast: Secondary | ICD-10-CM

## 2019-08-04 ENCOUNTER — Other Ambulatory Visit: Payer: Self-pay | Admitting: Primary Care

## 2019-08-04 DIAGNOSIS — R928 Other abnormal and inconclusive findings on diagnostic imaging of breast: Secondary | ICD-10-CM

## 2019-08-05 ENCOUNTER — Other Ambulatory Visit (RURAL_HEALTH_CENTER): Payer: Self-pay | Admitting: Family Medicine

## 2019-08-05 DIAGNOSIS — I10 Essential (primary) hypertension: Secondary | ICD-10-CM

## 2019-08-12 ENCOUNTER — Telehealth: Payer: Self-pay

## 2019-08-12 NOTE — Telephone Encounter (Signed)
Per DPR, left detail message for patient. Left comment for patient that we do not offer the vaccine at this office. [place]'s appointments for covid vaccine are full. We can placed patient on the wait list or patient may call the health department to see what is available there.

## 2019-08-12 NOTE — Telephone Encounter (Signed)
Unable to reach pt by phone. FYI to [employer].

## 2019-08-12 NOTE — Telephone Encounter (Signed)
[place] Night - Client Nonclinical Telephone Record AccessNurse Client [facility] Night - Client Client Site [place] Physician Alma Friendly - NP Contact Type Call Who Is Calling Patient / Member / Family / Caregiver Caller Name [place] Phone Number [phone] Patient Name Marie Ruiz Patient DOB 10/15/1945 Call Type Message Only Information Provided Reason for Call Request for General Office Information Initial Comment Caller states she is calling regarding the COVID vaccination and is wondering if she can be scheduled to receive the vaccination if the office has it available. Additional Comment Caller is requesting a return call. Provided caller with office hours. Disp. Time Disposition Final User 08/12/2019 8:03:56 AM General Information Provided Yes Rulon Eisenmenger Call Closed By: Rulon Eisenmenger Transaction Date/Time: 08/12/2019 8:00:22 AM (ET)

## 2019-08-13 ENCOUNTER — Ambulatory Visit: Payer: Medicare Other

## 2019-08-13 ENCOUNTER — Other Ambulatory Visit: Payer: Self-pay

## 2019-08-13 ENCOUNTER — Ambulatory Visit
Admission: RE | Admit: 2019-08-13 | Discharge: 2019-08-13 | Disposition: A | Discharge status: Home / Self Care | Payer: Medicare Other | Source: Ambulatory Visit | Attending: Primary Care | Admitting: Primary Care

## 2019-08-13 DIAGNOSIS — R928 Other abnormal and inconclusive findings on diagnostic imaging of breast: Secondary | ICD-10-CM

## 2019-08-13 DIAGNOSIS — R922 Inconclusive mammogram: Secondary | ICD-10-CM | POA: Diagnosis not present

## 2019-08-23 ENCOUNTER — Encounter: Payer: Self-pay | Admitting: Internal Medicine

## 2019-08-23 ENCOUNTER — Ambulatory Visit (INDEPENDENT_AMBULATORY_CARE_PROVIDER_SITE_OTHER): Payer: Medicare Other | Admitting: Internal Medicine

## 2019-08-23 ENCOUNTER — Other Ambulatory Visit: Payer: Self-pay

## 2019-08-23 VITALS — BP 118/72 | HR 65 | Temp 97.2°F | Ht 66.0 in | Wt 211.4 lb

## 2019-08-23 DIAGNOSIS — I82432 Acute embolism and thrombosis of left popliteal vein: Secondary | ICD-10-CM

## 2019-08-23 DIAGNOSIS — I10 Essential (primary) hypertension: Secondary | ICD-10-CM

## 2019-08-23 DIAGNOSIS — R0609 Other forms of dyspnea: Secondary | ICD-10-CM

## 2019-08-23 DIAGNOSIS — R0600 Dyspnea, unspecified: Secondary | ICD-10-CM

## 2019-08-23 DIAGNOSIS — I77810 Thoracic aortic ectasia: Secondary | ICD-10-CM | POA: Diagnosis not present

## 2019-08-23 DIAGNOSIS — E785 Hyperlipidemia, unspecified: Secondary | ICD-10-CM

## 2019-08-23 DIAGNOSIS — I700 Atherosclerosis of aorta: Secondary | ICD-10-CM

## 2019-08-23 NOTE — Patient Instructions (Addendum)
Medication Instructions:  NO CHANGES  *If you need a refill on your cardiac medications before your next appointment, please call your pharmacy*  Lab Work: NOT NEEDED   Testing/Procedures: WILL BE SCHEDULE AT [place] [address]   Your physician has requested that you have an echocardiogram. Echocardiography is a painless test that uses sound waves to create images of your heart. It provides your doctor with information about the size and shape of your heart and how well your heart's chambers and valves are working. This procedure takes approximately one hour. There are no restrictions for this procedure.    Follow-Up: At [facility], you and your health needs are our priority.  As part of our continuing mission to provide you with exceptional heart care, we have created designated Provider Care Teams.  These Care Teams include your primary Cardiologist (physician) and Advanced Practice Providers (APPs -  Physician Assistants and Nurse Practitioners) who all work together to provide you with the care you need, when you need it.  Your next appointment:   6 month(s)  The format for your next appointment:   In Person  Provider:   Cherlynn Kaiser, MD  Other Instructions N/A

## 2019-08-23 NOTE — Progress Notes (Signed)
Cardiology Office Note:    Date:  08/23/2019   ID:  Marie Ruiz, DOB 1945/10/06, MRN [id number]  PCP:  Pleas [name], NP  Cardiologist:  Elouise Munroe, MD  Electrophysiologist:  None   Referring MD: Pleas [name], NP   Chief Complaint: f/u Ascending aorta dilation, aortic atherosclerosis.  History of Present Illness:    Marie Ruiz is a 75 y.o. female with a hx of DVT with completed anticoagulation, HTN, HLD who presents today for review of aortic atherosclerosis and ascending aorta dilation seen on recent CT chest obtained for history of DVT and unintentional weight loss.  Her aorta measures 4.0 cm on CTA chest, and she is noted to have aortic atherosclerosis. I have independently reviewed these images.  We discussed that this demonstrates stability of the aneurysm.  We also discussed that 40 mm is not a significant aneurysm, and if it remains stable may never need intervention.  We discussed natural history of thoracic aortic aneurysm as well as methods for prevention of growth including control of hypertension and therapy with ARB.  We also discussed follow-up plan.  Given the stability of her aneurysm, we discussed use of echo and following thoracic aortic aneurysms to avoid radiation.  She tells me she had 2 brothers with thoracic aortic aneurysms.  In addition she is describing some mild dyspnea on exertion with her activities.  Past Medical History:  Diagnosis Date  . Anxiety   . Arthritis   . Blood clotting disorder ([place])   . Bone spur    with excision  . Chickenpox   . Colon polyps   . DVT (deep venous thrombosis) ([place])   . Glaucoma    both  . Graves disease 03/30/2013  . History of blood clots 10/2017  . Hyperlipemia   . Hypertension   . Osteopenia   . Prediabetes   . Restless leg syndrome     Past Surgical History:  Procedure Laterality Date  . BREAST CYST ASPIRATION    . CATARACT EXTRACTION W/ INTRAOCULAR LENS IMPLANT Right 2015    . CATARACT EXTRACTION W/PHACO Left 2017  . COLONOSCOPY  2014  . DILATION AND CURETTAGE OF UTERUS      Current Medications: Current Meds  Medication Sig  . acetaminophen (TYLENOL) 500 MG tablet Take 500 mg by mouth as needed for mild pain.  Marland Kitchen amLODipine-benazepril (LOTREL) 5-20 MG capsule Take 1 capsule by mouth daily.  Marland Kitchen atorvastatin (LIPITOR) 40 MG tablet Take 1 tablet (40 mg total) by mouth daily.  Marland Kitchen b complex vitamins capsule Take 1 capsule by mouth daily.  . Calcium Carbonate-Vit D-Min (CALCIUM 1200 PO) Take by mouth.  . cetirizine (ZYRTEC) 10 MG tablet Take 10 mg by mouth daily.  . cholecalciferol (VITAMIN D) 1000 units tablet Take 1,000 Units by mouth daily.  . Coenzyme Q10 (COQ10 PO) Take 300 mg by mouth daily.  . diclofenac sodium (VOLTAREN) 1 % GEL APPLY 2 GRAMS TO AFFECTED AREAS 4 TIMES DAILY AS NEEDED  . dorzolamide-timolol (COSOPT) 22.3-6.8 MG/ML ophthalmic solution Place 1 drop into both eyes 2 (two) times daily.  Marland Kitchen FLUoxetine (PROZAC) 10 MG capsule Take 1 capsule by mouth once daily for anxiety.  Marland Kitchen latanoprost (XALATAN) 0.005 % ophthalmic solution Place 1 drop into both eyes at bedtime.  . Multiple Vitamin (MULTIVITAMIN) tablet Take 1 tablet by mouth daily.  . Olopatadine HCl (PAZEO) 0.7 % SOLN Apply 1 drop to eye daily.  . Omega-3 Fatty Acids (FISH OIL) 1000 MG  CAPS Take 1,000 mg by mouth daily.  Marland Kitchen omeprazole (PRILOSEC) 20 MG capsule Take 20 mg by mouth daily.  Marland Kitchen trolamine salicylate (ASPERCREME) 10 % cream Apply 1 application topically as needed for muscle pain.     Allergies:   Codeine and Prednisone   Social History   Socioeconomic History  . Marital status: Married    Spouse name: Not on file  . Number of children: Not on file  . Years of education: Not on file  . Highest education level: Not on file  Occupational History  . Occupation: Retired  Tobacco Use  . Smoking status: Former Smoker    Quit date: 1990    Years since quitting: 31.1  . Smokeless  tobacco: Never Used  Substance and Sexual Activity  . Alcohol use: Yes    Alcohol/week: 2.0 standard drinks    Types: 2 Standard drinks or equivalent per week  . Drug use: Not Currently  . Sexual activity: Not Currently  Other Topics Concern  . Not on file  Social History Narrative   Lives in [place], moving to [place].   Retired.   Married.    Social Determinants of Health   Financial Resource Strain:   . Difficulty of Paying Living Expenses: Not on file  Food Insecurity:   . Worried About Charity fundraiser in the Last Year: Not on file  . Ran Out of Food in the Last Year: Not on file  Transportation Needs:   . Lack of Transportation (Medical): Not on file  . Lack of Transportation (Non-Medical): Not on file  Physical Activity:   . Days of Exercise per Week: Not on file  . Minutes of Exercise per Session: Not on file  Stress:   . Feeling of Stress : Not on file  Social Connections:   . Frequency of Communication with Friends and Family: Not on file  . Frequency of Social Gatherings with Friends and Family: Not on file  . Attends Religious Services: Not on file  . Active Member of Clubs or Organizations: Not on file  . Attends Archivist Meetings: Not on file  . Marital Status: Not on file     Family History: The patient's family history includes Aneurysm in her brother; Breast cancer in her maternal grandmother; Breast cancer (age of onset: 38) in her sister; Colon polyps in her sister; Heart attack in her father; Heart disease in her mother; Multiple myeloma in her sister. There is no history of Esophageal cancer or Colon cancer.  ROS:   Please see the history of present illness.    All other systems reviewed and are negative.  EKGs/Labs/Other Studies Reviewed:    The following studies were reviewed today:  EKG: Normal sinus rhythm  Recent Labs: 01/25/2019: TSH 1.83 05/14/2019: ALT 24; BUN 11; Creatinine 0.78; Hemoglobin 13.3; Platelet Count 289; Potassium  4.0; Sodium 138  Recent Lipid Panel    Component Value Date/Time   CHOL 161 01/25/2019 0900   TRIG 179.0 (H) 01/25/2019 0900   HDL 59.20 01/25/2019 0900   CHOLHDL 3 01/25/2019 0900   VLDL 35.8 01/25/2019 0900   LDLCALC 66 01/25/2019 0900    Physical Exam:    VS:  BP 118/72   Pulse 65   Temp (!) 97.2 F (36.2 C)   Ht 5' 6"  (1.676 m)   Wt 211 lb 6.4 oz (95.9 kg)   SpO2 96%   BMI 34.12 kg/m     Wt Readings from Last 5  Encounters:  08/23/19 211 lb 6.4 oz (95.9 kg)  05/21/19 209 lb 8 oz (95 kg)  02/22/19 205 lb (93 kg)  02/01/19 208 lb 8 oz (94.6 kg)  01/13/19 209 lb 9.6 oz (95.1 kg)     Constitutional: No acute distress Eyes: sclera non-icteric, normal conjunctiva and lids ENMT: normal dentition, moist mucous membranes Cardiovascular: regular rhythm, normal rate, no murmurs. S1 and S2 normal. Radial pulses normal bilaterally. No jugular venous distention.  Respiratory: clear to auscultation bilaterally GI : normal bowel sounds, soft and nontender. No distention.   MSK: extremities warm, well perfused. No edema.  NEURO: grossly nonfocal exam, moves all extremities. PSYCH: alert and oriented x 3, normal mood and affect.   ASSESSMENT:    1. Ascending aorta dilatation (HCC)   2. DOE (dyspnea on exertion)   3. Essential hypertension   4. Deep vein thrombosis (DVT) of popliteal vein of left lower extremity, unspecified chronicity (HCC)   5. Hyperlipidemia, unspecified hyperlipidemia type   6. Aortic atherosclerosis (HCC)    PLAN:    Dyspnea on exertion -her dyspnea on exertion is mild but present and we can evaluate this with an echocardiogram to evaluate systolic and diastolic function.  This may help [place] tailor her therapy going forward for her thoracic aortic aneurysm.  TAA -to limit radiation dose, our next check should be with echocardiogram.  If the measurements correlate well, would follow her with echocardiography annually, with CT angiography every 5 years if  stable.  MR angiography of the aorta can be performed, however spatial resolution is less than with CT, and for comparison of measurements, I would prefer CT scanning intermittently.  I am hopeful echo will track this appropriately.  Hypertension-currently well controlled blood pressure.  Continue amlodipine benazepril.  DVT with completed anticoagulation-no current indication for continued anticoagulation.  Following.  Aortic atherosclerosis-secondary prevention of atherosclerosis with atorvastatin 40 mg daily.  Patient is tolerating well.   Total time of encounter: 40 minutes total time of encounter, including 30 minutes spent in face-to-face patient care. This time includes coordination of care and counseling regarding above-mentioned issues. Remainder of non-face-to-face time involved reviewing chart documents/testing relevant to the patient encounter and documentation in the medical record.  Cherlynn Kaiser, MD [place]  [facility]   Medication Adjustments/Labs and Tests Ordered: Current medicines are reviewed at length with the patient today.  Concerns regarding medicines are outlined above.  Orders Placed This Encounter  Procedures  . EKG 12-Lead  . ECHOCARDIOGRAM COMPLETE   No orders of the defined types were placed in this encounter.   Patient Instructions  Medication Instructions:  NO CHANGES  *If you need a refill on your cardiac medications before your next appointment, please call your pharmacy*  Lab Work: NOT NEEDED   Testing/Procedures: WILL BE SCHEDULE AT [address]   Your physician has requested that you have an echocardiogram. Echocardiography is a painless test that uses sound waves to create images of your heart. It provides your doctor with information about the size and shape of your heart and how well your heart's chambers and valves are working. This procedure takes approximately one hour. There are no restrictions for this  procedure.    Follow-Up: At [facility], you and your health needs are our priority.  As part of our continuing mission to provide you with exceptional heart care, we have created designated Provider Care Teams.  These Care Teams include your primary Cardiologist (physician) and Advanced Practice  Providers (APPs -  Physician Assistants and Nurse Practitioners) who all work together to provide you with the care you need, when you need it.  Your next appointment:   6 month(s)  The format for your next appointment:   In Person  Provider:   Cherlynn Kaiser, MD  Other Instructions N/A

## 2019-09-02 ENCOUNTER — Other Ambulatory Visit: Payer: Self-pay

## 2019-09-02 ENCOUNTER — Ambulatory Visit (HOSPITAL_COMMUNITY): Payer: Medicare Other | Attending: Cardiology

## 2019-09-02 DIAGNOSIS — R0600 Dyspnea, unspecified: Secondary | ICD-10-CM

## 2019-09-02 DIAGNOSIS — I77810 Thoracic aortic ectasia: Secondary | ICD-10-CM | POA: Diagnosis not present

## 2019-09-02 DIAGNOSIS — R0609 Other forms of dyspnea: Secondary | ICD-10-CM

## 2019-09-17 ENCOUNTER — Telehealth: Payer: Self-pay

## 2019-09-17 NOTE — Telephone Encounter (Signed)
Patient called to make [place] aware she had the COVID vaccine - she had the [place] vaccine on 09/14/19. Patient states she was also told to give [place] a the code number for her vaccine which was [phone] (Lot number?)  She states she is overall feeling very well, but is feeling fatigued which she states that advised her was a side effect. She is due for her 2nd shot on March 2nd.

## 2019-09-17 NOTE — Telephone Encounter (Signed)
Please abstract 

## 2019-09-20 NOTE — Telephone Encounter (Signed)
Noted. Updated as instructed.

## 2019-10-25 DIAGNOSIS — H04123 Dry eye syndrome of bilateral lacrimal glands: Secondary | ICD-10-CM | POA: Diagnosis not present

## 2019-10-25 DIAGNOSIS — H401133 Primary open-angle glaucoma, bilateral, severe stage: Secondary | ICD-10-CM | POA: Diagnosis not present

## 2019-10-25 DIAGNOSIS — H16223 Keratoconjunctivitis sicca, not specified as Sjogren's, bilateral: Secondary | ICD-10-CM | POA: Diagnosis not present

## 2019-10-25 DIAGNOSIS — H26492 Other secondary cataract, left eye: Secondary | ICD-10-CM | POA: Diagnosis not present

## 2019-11-01 DIAGNOSIS — H04123 Dry eye syndrome of bilateral lacrimal glands: Secondary | ICD-10-CM | POA: Diagnosis not present

## 2019-11-01 DIAGNOSIS — H401133 Primary open-angle glaucoma, bilateral, severe stage: Secondary | ICD-10-CM | POA: Diagnosis not present

## 2019-11-01 DIAGNOSIS — Z961 Presence of intraocular lens: Secondary | ICD-10-CM | POA: Diagnosis not present

## 2019-11-18 ENCOUNTER — Telehealth: Payer: Self-pay

## 2019-11-18 NOTE — Telephone Encounter (Signed)
Pt said she has problems getting to sleep at night; pt said she can lay for hours before going to sleep; pt used to take amitriptyline but it does not work and gives pt hallucinations. Added to allergy/contraindications med list. Pt said her sister takes Trazodone Hydrochloride 50 mg and it works great to help her sister sleep and pt wants to know if she can try that to [facility]. Pt request cb. Last annual 02/01/19. I advised pt sometimes needs visit prior to starting med for insomnia. Pt will wait for cb.

## 2019-11-19 NOTE — Telephone Encounter (Signed)
Pt called triage line. I made her a virtual appt for 11-22-19.

## 2019-11-19 NOTE — Telephone Encounter (Signed)
Needs office visit.  Can be virtual if she prefers.

## 2019-11-22 ENCOUNTER — Encounter: Payer: Self-pay | Admitting: Primary Care

## 2019-11-22 ENCOUNTER — Other Ambulatory Visit: Payer: Self-pay

## 2019-11-22 ENCOUNTER — Telehealth (INDEPENDENT_AMBULATORY_CARE_PROVIDER_SITE_OTHER): Payer: Medicare Other | Admitting: Primary Care

## 2019-11-22 DIAGNOSIS — F411 Generalized anxiety disorder: Secondary | ICD-10-CM

## 2019-11-22 DIAGNOSIS — G4700 Insomnia, unspecified: Secondary | ICD-10-CM | POA: Diagnosis not present

## 2019-11-22 MED ORDER — TRAZODONE HCL 50 MG PO TABS: 50.0000 mg | ORAL_TABLET | Freq: Every evening | ORAL | 1 refills | Status: DC | PRN

## 2019-11-22 NOTE — Assessment & Plan Note (Signed)
Overall doing well on low dose fluoxetine. For some reason her prior PCP is still filling this medication. Discussed for patient to contact me when her next fill is due, we will send a new Rx at that time.

## 2019-11-22 NOTE — Assessment & Plan Note (Signed)
Chronic, worse over the last year with Covid-19. She did well with Trazodone last week, will trial a prescription of her own. She will update.

## 2019-11-22 NOTE — Progress Notes (Signed)
Subjective:    Patient ID: Marie Ruiz, female    DOB: 1946/05/15, 74 y.o.   MRN: [id number]  HPI  Virtual Visit via Video Note  I connected with Marie Ruiz on 11/22/19 at  8:40 AM EDT by a video enabled telemedicine application and verified that I am speaking with the correct person using two identifiers.  Location: Patient: Home Provider: Office   I discussed the limitations of evaluation and management by telemedicine and the availability of in person appointments. The patient expressed understanding and agreed to proceed.  History of Present Illness:  This visit occurred during the SARS-CoV-2 public health emergency.  Safety protocols were in place, including screening questions prior to the visit, additional usage of staff PPE, and extensive cleaning of exam room while observing appropriate contact time as indicated for disinfecting solutions.   Marie Ruiz is a 74 year old female with a history of hypertension, DVT, Graves Disease, GAD who presents today with a chief complaint of insomnia.   She struggles with falling asleep, can stay asleep once she is asleep. Symptoms began several years, worse over the last year with Covid-19 infection. She is managed on amitriptyline 10 mg PRN that was prescribed by her prior PCP. She doesn't like taking amitriptyline due to side effects of hallucinations.   She took a friends Trazodone 50 mg tablet pill last week and slept very well. She would like her own prescription. She is prescribed fluoxetine 10 mg for which she's taken daily for years, overall doing well.    Observations/Objective:  Alert and oriented. Appears well, not sickly. No distress. Speaking in complete sentences.   Assessment and Plan:  See problem based charting.  Follow Up Instructions:  You may take Trazodone 50 mg nightly for sleep.  Continue fluoxetine 10 mg daily for anxiety.  We will see you later this Summer for your physical.   It  was a pleasure to see you today! Marie Bossier, NP-C    I discussed the assessment and treatment plan with the patient. The patient was provided an opportunity to ask questions and all were answered. The patient agreed with the plan and demonstrated an understanding of the instructions.   The patient was advised to call back or seek an in-person evaluation if the symptoms worsen or if the condition fails to improve as anticipated.    Pleas [name], NP     Review of Systems  Respiratory: Negative for shortness of breath.   Cardiovascular: Negative for chest pain.  Neurological: Negative for dizziness and headaches.  Psychiatric/Behavioral: Positive for sleep disturbance. The patient is not nervous/anxious.        Past Medical History:  Diagnosis Date  . Anxiety   . Arthritis   . Blood clotting disorder ([place])   . Bone spur    with excision  . Chickenpox   . Colon polyps   . DVT (deep venous thrombosis) ([place])   . Glaucoma    both  . Graves disease 03/30/2013  . History of blood clots 10/2017  . Hyperlipemia   . Hypertension   . Osteopenia   . Prediabetes   . Restless leg syndrome      Social History   Socioeconomic History  . Marital status: Married    Spouse name: Not on file  . Number of children: Not on file  . Years of education: Not on file  . Highest education level: Not on file  Occupational History  . Occupation: Retired  Tobacco Use  . Smoking status: Former Smoker    Quit date: 1990    Years since quitting: 31.3  . Smokeless tobacco: Never Used  Substance and Sexual Activity  . Alcohol use: Yes    Alcohol/week: 2.0 standard drinks    Types: 2 Standard drinks or equivalent per week  . Drug use: Not Currently  . Sexual activity: Not Currently  Other Topics Concern  . Not on file  Social History Narrative   Lives in [place], moving to [place].   Retired.   Married.    Social Determinants of Health   Financial Resource Strain:   . Difficulty  of Paying Living Expenses:   Food Insecurity:   . Worried About Charity fundraiser in the Last Year:   . Arboriculturist in the Last Year:   Transportation Needs:   . Film/video editor (Medical):   Marie Ruiz Lack of Transportation (Non-Medical):   Physical Activity:   . Days of Exercise per Week:   . Minutes of Exercise per Session:   Stress:   . Feeling of Stress :   Social Connections:   . Frequency of Communication with Friends and Family:   . Frequency of Social Gatherings with Friends and Family:   . Attends Religious Services:   . Active Member of Clubs or Organizations:   . Attends Archivist Meetings:   Marie Ruiz Marital Status:   Intimate Partner Violence:   . Fear of Current or Ex-Partner:   . Emotionally Abused:   Marie Ruiz Physically Abused:   . Sexually Abused:     Past Surgical History:  Procedure Laterality Date  . BREAST CYST ASPIRATION    . CATARACT EXTRACTION W/ INTRAOCULAR LENS IMPLANT Right 2015  . CATARACT EXTRACTION W/PHACO Left 2017  . COLONOSCOPY  2014  . DILATION AND CURETTAGE OF UTERUS      Family History  Problem Relation Age of Onset  . Heart disease Mother   . Heart attack Father   . Breast cancer Sister 50  . Colon polyps Sister   . Aneurysm Brother   . Multiple myeloma Sister        found in stage 4  . Breast cancer Maternal Grandmother   . Esophageal cancer Neg Hx   . Colon cancer Neg Hx     Allergies  Allergen Reactions  . Amitriptyline Other (See Comments)    hallucinations  . Codeine   . Prednisone     Depression    Current Outpatient Medications on File Prior to Visit  Medication Sig Dispense Refill  . acetaminophen (TYLENOL) 500 MG tablet Take 500 mg by mouth as needed for mild pain.    Marie Ruiz amLODipine-benazepril (LOTREL) 5-20 MG capsule Take 1 capsule by mouth daily. 30 capsule 3  . atorvastatin (LIPITOR) 40 MG tablet Take 1 tablet (40 mg total) by mouth daily. 90 tablet 3  . b complex vitamins capsule Take 1 capsule by mouth  daily.    . Calcium Carbonate-Vit D-Min (CALCIUM 1200 PO) Take by mouth.    . cetirizine (ZYRTEC) 10 MG tablet Take 10 mg by mouth daily.    . cholecalciferol (VITAMIN D) 1000 units tablet Take 1,000 Units by mouth daily.    . Coenzyme Q10 (COQ10 PO) Take 300 mg by mouth daily.    . diclofenac sodium (VOLTAREN) 1 % GEL APPLY 2 GRAMS TO AFFECTED AREAS 4 TIMES DAILY AS NEEDED 100 g 0  . dorzolamide-timolol (COSOPT) 22.3-6.8 MG/ML ophthalmic solution  Place 1 drop into both eyes 2 (two) times daily.    Marie Ruiz FLUoxetine (PROZAC) 10 MG capsule Take 1 capsule by mouth once daily for anxiety. 3 capsule 0  . latanoprost (XALATAN) 0.005 % ophthalmic solution Place 1 drop into both eyes at bedtime.    . Multiple Vitamin (MULTIVITAMIN) tablet Take 1 tablet by mouth daily.    . Olopatadine HCl (PAZEO) 0.7 % SOLN Apply 1 drop to eye daily.    . Omega-3 Fatty Acids (FISH OIL) 1000 MG CAPS Take 1,000 mg by mouth daily.    Marie Ruiz omeprazole (PRILOSEC) 20 MG capsule Take 20 mg by mouth daily.    Marie Ruiz trolamine salicylate (ASPERCREME) 10 % cream Apply 1 application topically as needed for muscle pain.     No current facility-administered medications on file prior to visit.    There were no vitals taken for this visit.   Objective:   Physical Exam  Constitutional: She is oriented to person, place, and time. She appears well-nourished.  Respiratory: Effort normal.  Neurological: She is alert and oriented to person, place, and time.  Psychiatric: She has a normal mood and affect.           Assessment & Plan:

## 2019-11-22 NOTE — Patient Instructions (Signed)
You may take Trazodone 50 mg nightly for sleep.  Continue fluoxetine 10 mg daily for anxiety.  We will see you later this Summer for your physical.   It was a pleasure to see you today! [name], NP-C

## 2019-11-30 ENCOUNTER — Other Ambulatory Visit (RURAL_HEALTH_CENTER): Payer: Self-pay | Admitting: Family Medicine

## 2019-12-13 ENCOUNTER — Encounter: Payer: Self-pay | Admitting: Gastroenterology

## 2020-01-03 ENCOUNTER — Telehealth: Payer: Self-pay | Admitting: *Deleted

## 2020-01-03 NOTE — Telephone Encounter (Signed)
noted 

## 2020-01-03 NOTE — Telephone Encounter (Signed)
Dr. Tarri Glenn,  This pt is scheduled for a direct colonoscopy on 01-26-20.  She saw you in the office for diarrhea issues on 06-05-18.  I am reviewing her chart for PV and noted a few dxs since she saw you-ascending aorta dilatation, aortic atherosclesis.  I just wanted to make sure she is ok to proceed as a direct colonoscopy.  Please advise.  Thanks, [employer]

## 2020-01-03 NOTE — Telephone Encounter (Signed)
Noted. Thank you. OK to proceed.

## 2020-01-11 ENCOUNTER — Other Ambulatory Visit: Payer: Self-pay

## 2020-01-11 ENCOUNTER — Ambulatory Visit (AMBULATORY_SURGERY_CENTER): Payer: Self-pay

## 2020-01-11 VITALS — Ht 66.0 in | Wt 206.0 lb

## 2020-01-11 DIAGNOSIS — Z86010 Personal history of colonic polyps: Secondary | ICD-10-CM

## 2020-01-11 MED ORDER — SUTAB 1479-225-188 MG PO TABS: 12.0000 | ORAL_TABLET | ORAL | 0 refills | Status: DC

## 2020-01-11 NOTE — Progress Notes (Signed)
No allergies to soy or egg Pt is not on blood thinners or diet pills Denies issues with sedation/intubation Denies atrial flutter/fib Denies constipation   Emmi instructions given to pt  Pt is aware of Covid safety and care partner requirements.  Pt did not have glasses with her-material was reviewed with her (consent form) as well as other material presented.

## 2020-01-26 ENCOUNTER — Other Ambulatory Visit: Payer: Self-pay

## 2020-01-26 ENCOUNTER — Encounter: Payer: Self-pay | Admitting: Gastroenterology

## 2020-01-26 ENCOUNTER — Ambulatory Visit (AMBULATORY_SURGERY_CENTER): Payer: Medicare Other | Admitting: Gastroenterology

## 2020-01-26 VITALS — BP 125/76 | HR 58 | Temp 97.3°F | Resp 17 | Ht 66.0 in | Wt 206.0 lb

## 2020-01-26 DIAGNOSIS — Z86010 Personal history of colonic polyps: Secondary | ICD-10-CM | POA: Diagnosis not present

## 2020-01-26 MED ORDER — SODIUM CHLORIDE 0.9 % IV SOLN: 500.0000 mL | Freq: Once | INTRAVENOUS | Status: DC

## 2020-01-26 NOTE — Op Note (Signed)
[place] Patient Name: Marie Ruiz Procedure Date: 01/26/2020 9:48 AM MRN: [id number] Endoscopist: Thornton Park MD, MD Age: 74 Referring MD:  Date of Birth: 08-Aug-1945 Gender: Female Account #: [id number] Procedure:                Colonoscopy Indications:              Screening for colorectal malignant neoplasm                           Patient reported history of colon polyps                           No known family history of colon cancer or polyps                           Colonoscopy at [facility] with Dr.                            Tommi Emery 02/11/13: Normal screening colonoscopy. No                            polyps. Random colon biopsies to evaluate for                            microscopic colitis were normal. Medicines:                Monitored Anesthesia Care Procedure:                Pre-Anesthesia Assessment:                           - Prior to the procedure, a History and Physical                            was performed, and patient medications and                            allergies were reviewed. The patient's tolerance of                            previous anesthesia was also reviewed. The risks                            and benefits of the procedure and the sedation                            options and risks were discussed with the patient.                            All questions were answered, and informed consent                            was obtained. Prior Anticoagulants: The patient has  taken no previous anticoagulant or antiplatelet                            agents. ASA Grade Assessment: III - A patient with                            severe systemic disease. After reviewing the risks                            and benefits, the patient was deemed in                            satisfactory condition to undergo the procedure.                           After obtaining informed consent, the  colonoscope                            was passed under direct vision. Throughout the                            procedure, the patient's blood pressure, pulse, and                            oxygen saturations were monitored continuously. The                            Colonoscope was introduced through the anus and                            advanced to the 4 cm into the ileum. The                            colonoscopy was performed without difficulty. The                            patient tolerated the procedure well. The quality                            of the bowel preparation was good. The terminal                            ileum, ileocecal valve, appendiceal orifice, and                            rectum were photographed. Scope In: 9:53:36 AM Scope Out: 10:06:53 AM Scope Withdrawal Time: 0 hours 10 minutes 31 seconds  Total Procedure Duration: 0 hours 13 minutes 17 seconds  Findings:                 The perianal and digital rectal examinations were                            normal.  The entire examined colon appeared normal on direct                            and retroflexion views. Complications:            No immediate complications. Estimated Blood Loss:     Estimated blood loss: none. Impression:               - The entire examined colon is normal on direct and                            retroflexion views.                           - No specimens collected. Recommendation:           - Patient has a contact number available for                            emergencies. The signs and symptoms of potential                            delayed complications were discussed with the                            patient. Return to normal activities tomorrow.                            Written discharge instructions were provided to the                            patient.                           - Resume previous diet. Use a daily stool bulking                             agent such as Metamucil, Benefiber, or Citrucel to                            try to regulate your bowel habits.                           - Continue present medications.                           - Repeat colonoscopy in 10 years for screening                            purposes if it is clinically appropriate at that                            time.                           - Emerging evidence supports eating a diet of  fruits, vegetables, grains, calcium, and yogurt                            while reducing red meat and alcohol may reduce the                            risk of colon cancer.                           - Thank you for allowing me to be involved in your                            colon cancer prevention. Thornton Park MD, MD 01/26/2020 10:13:42 AM This report has been signed electronically.

## 2020-01-26 NOTE — Progress Notes (Signed)
pt tolerated well. VSS. awake and to recovery. Report given to RN.  

## 2020-01-26 NOTE — Progress Notes (Signed)
Pt's states no medical or surgical changes since previsit or office visit. 

## 2020-01-26 NOTE — Patient Instructions (Signed)
Resume previous diet. Use a daily stool bulking agent such as Metamucil, Benefiber or Citrucel to try to regulate your bowel habits.   YOU HAD AN ENDOSCOPIC PROCEDURE TODAY AT [facility]:   Refer to the procedure report that was given to you for any specific questions about what was found during the examination.  If the procedure report does not answer your questions, please call your gastroenterologist to clarify.  If you requested that your care partner not be given the details of your procedure findings, then the procedure report has been included in a sealed envelope for you to review at your convenience later.  YOU SHOULD EXPECT: Some feelings of bloating in the abdomen. Passage of more gas than usual.  Walking can help get rid of the air that was put into your GI tract during the procedure and reduce the bloating. If you had a lower endoscopy (such as a colonoscopy or flexible sigmoidoscopy) you may notice spotting of blood in your stool or on the toilet paper. If you underwent a bowel prep for your procedure, you may not have a normal bowel movement for a few days.  Please Note:  You might notice some irritation and congestion in your nose or some drainage.  This is from the oxygen used during your procedure.  There is no need for concern and it should clear up in a day or so.  SYMPTOMS TO REPORT IMMEDIATELY:   Following lower endoscopy (colonoscopy or flexible sigmoidoscopy):  Excessive amounts of blood in the stool  Significant tenderness or worsening of abdominal pains  Swelling of the abdomen that is new, acute  Fever of 100F or higher  For urgent or emergent issues, a gastroenterologist can be reached at any hour by calling [phone]. Do not use MyChart messaging for urgent concerns.    DIET:  We do recommend a small meal at first, but then you may proceed to your regular diet.  Drink plenty of fluids but you should avoid alcoholic beverages for 24  hours.  ACTIVITY:  You should plan to take it easy for the rest of today and you should NOT DRIVE or use heavy machinery until tomorrow (because of the sedation medicines used during the test).    FOLLOW UP: Our staff will call the number listed on your records 48-72 hours following your procedure to check on you and address any questions or concerns that you may have regarding the information given to you following your procedure. If we do not reach you, we will leave a message.  We will attempt to reach you two times.  During this call, we will ask if you have developed any symptoms of COVID 19. If you develop any symptoms (ie: fever, flu-like symptoms, shortness of breath, cough etc.) before then, please call [phone].  If you test positive for Covid 19 in the 2 weeks post procedure, please call and report this information to [place].    If any biopsies were taken you will be contacted by phone or by letter within the next 1-3 weeks.  Please call us at [phone] if you have not heard about the biopsies in 3 weeks.    SIGNATURES/CONFIDENTIALITY: You and/or your care partner have signed paperwork which will be entered into your electronic medical record.  These signatures attest to the fact that that the information above on your After Visit Summary has been reviewed and is understood.  Full responsibility of the confidentiality of this discharge information lies  with you and/or your care-partner.

## 2020-01-28 ENCOUNTER — Telehealth: Payer: Self-pay

## 2020-01-28 NOTE — Telephone Encounter (Signed)
NO ANSWER, MESSAGE LEFT FOR PATIENT. 

## 2020-01-28 NOTE — Telephone Encounter (Signed)
  Follow up Call-  Call back number 01/26/2020  Post procedure Call Back phone  # [phone]  Permission to leave phone message Yes  Some recent data might be hidden     Patient questions:  Do you have a fever, pain , or abdominal swelling? No. Pain Score  0 *  Have you tolerated food without any problems? Yes.    Have you been able to return to your normal activities? Yes.    Do you have any questions about your discharge instructions: Diet   No. Medications  No. Follow up visit  No.  Do you have questions or concerns about your Care? No.  Actions: * If pain score is 4 or above: No action needed, pain <4.  1. Have you developed a fever since your procedure? no  2.   Have you had an respiratory symptoms (SOB or cough) since your procedure? no  3.   Have you tested positive for COVID 19 since your procedure no  4.   Have you had any family members/close contacts diagnosed with the COVID 19 since your procedure?  no   If yes to any of these questions please route to Joylene John, RN and Erenest Rasher, RN

## 2020-01-30 ENCOUNTER — Other Ambulatory Visit (RURAL_HEALTH_CENTER): Payer: Self-pay | Admitting: Family Medicine

## 2020-01-30 DIAGNOSIS — I10 Essential (primary) hypertension: Secondary | ICD-10-CM

## 2020-01-31 NOTE — Telephone Encounter (Signed)
For your review. Last office visit 12/2017.

## 2020-02-08 ENCOUNTER — Ambulatory Visit: Payer: Medicare Other

## 2020-02-10 ENCOUNTER — Ambulatory Visit (INDEPENDENT_AMBULATORY_CARE_PROVIDER_SITE_OTHER): Payer: Medicare Other

## 2020-02-10 ENCOUNTER — Encounter: Payer: Self-pay | Admitting: Primary Care

## 2020-02-10 ENCOUNTER — Ambulatory Visit (INDEPENDENT_AMBULATORY_CARE_PROVIDER_SITE_OTHER): Payer: Medicare Other | Admitting: Primary Care

## 2020-02-10 ENCOUNTER — Other Ambulatory Visit: Payer: Self-pay

## 2020-02-10 VITALS — BP 124/76 | HR 65 | Ht 66.0 in | Wt 202.5 lb

## 2020-02-10 VITALS — BP 124/76 | HR 65 | Temp 95.8°F | Ht 66.0 in | Wt 202.5 lb

## 2020-02-10 DIAGNOSIS — M8949 Other hypertrophic osteoarthropathy, multiple sites: Secondary | ICD-10-CM | POA: Diagnosis not present

## 2020-02-10 DIAGNOSIS — F411 Generalized anxiety disorder: Secondary | ICD-10-CM | POA: Diagnosis not present

## 2020-02-10 DIAGNOSIS — M8580 Other specified disorders of bone density and structure, unspecified site: Secondary | ICD-10-CM

## 2020-02-10 DIAGNOSIS — Z0000 Encounter for general adult medical examination without abnormal findings: Secondary | ICD-10-CM | POA: Diagnosis not present

## 2020-02-10 DIAGNOSIS — G4700 Insomnia, unspecified: Secondary | ICD-10-CM | POA: Diagnosis not present

## 2020-02-10 DIAGNOSIS — D229 Melanocytic nevi, unspecified: Secondary | ICD-10-CM | POA: Diagnosis not present

## 2020-02-10 DIAGNOSIS — E785 Hyperlipidemia, unspecified: Secondary | ICD-10-CM | POA: Diagnosis not present

## 2020-02-10 DIAGNOSIS — I10 Essential (primary) hypertension: Secondary | ICD-10-CM | POA: Diagnosis not present

## 2020-02-10 DIAGNOSIS — H409 Unspecified glaucoma: Secondary | ICD-10-CM | POA: Diagnosis not present

## 2020-02-10 DIAGNOSIS — E0500 Thyrotoxicosis with diffuse goiter without thyrotoxic crisis or storm: Secondary | ICD-10-CM | POA: Diagnosis not present

## 2020-02-10 DIAGNOSIS — I712 Thoracic aortic aneurysm, without rupture: Secondary | ICD-10-CM

## 2020-02-10 DIAGNOSIS — Z23 Encounter for immunization: Secondary | ICD-10-CM

## 2020-02-10 DIAGNOSIS — I7121 Aneurysm of the ascending aorta, without rupture: Secondary | ICD-10-CM | POA: Insufficient documentation

## 2020-02-10 DIAGNOSIS — M159 Polyosteoarthritis, unspecified: Secondary | ICD-10-CM

## 2020-02-10 LAB — COMPREHENSIVE METABOLIC PANEL
ALT: 13 U/L (ref 0–35)
AST: 16 U/L (ref 0–37)
Albumin: 4.3 g/dL (ref 3.5–5.2)
Alkaline Phosphatase: 62 U/L (ref 39–117)
BUN: 11 mg/dL (ref 6–23)
CO2: 29 mEq/L (ref 19–32)
Calcium: 9.6 mg/dL (ref 8.4–10.5)
Chloride: 103 mEq/L (ref 96–112)
Creatinine, Ser: 0.72 mg/dL (ref 0.40–1.20)
GFR: 79.18 mL/min (ref >60.00)
Glucose, Bld: 123 mg/dL — ABNORMAL HIGH (ref 70–99)
Potassium: 4.3 mEq/L (ref 3.5–5.1)
Sodium: 139 mEq/L (ref 135–145)
Total Bilirubin: 0.5 mg/dL (ref 0.2–1.2)
Total Protein: 7.1 g/dL (ref 6.0–8.3)

## 2020-02-10 LAB — CBC
HCT: 39.7 % (ref 36.0–46.0)
Hemoglobin: 13.3 g/dL (ref 12.0–15.0)
MCHC: 33.4 g/dL (ref 30.0–36.0)
MCV: 93.1 fl (ref 78.0–100.0)
Platelets: 273 10*3/uL (ref 150.0–400.0)
RBC: 4.27 Mil/uL (ref 3.87–5.11)
RDW: 12.5 % (ref 11.5–15.5)
WBC: 4.3 10*3/uL (ref 4.0–10.5)

## 2020-02-10 LAB — LIPID PANEL
Cholesterol: 173 mg/dL (ref 0–200)
HDL: 57.1 mg/dL (ref >39.00)
LDL Cholesterol: 79 mg/dL (ref 0–99)
NonHDL: 115.74
Total CHOL/HDL Ratio: 3
Triglycerides: 185 mg/dL — ABNORMAL HIGH (ref 0.0–149.0)
VLDL: 37 mg/dL (ref 0.0–40.0)

## 2020-02-10 LAB — TSH: TSH: 2.42 u[IU]/mL (ref 0.35–4.50)

## 2020-02-10 MED ORDER — FLUOXETINE HCL 10 MG PO CAPS: ORAL_CAPSULE | ORAL | 3 refills | Status: DC

## 2020-02-10 MED ORDER — ZOSTER VAC RECOMB ADJUVANTED 50 MCG/0.5ML IM SUSR: 0.5000 mL | Freq: Once | INTRAMUSCULAR | 1 refills | Status: AC

## 2020-02-10 MED ORDER — ATORVASTATIN CALCIUM 40 MG PO TABS: 40.0000 mg | ORAL_TABLET | Freq: Every day | ORAL | 3 refills | Status: DC

## 2020-02-10 MED ORDER — AMLODIPINE BESY-BENAZEPRIL HCL 5-20 MG PO CAPS: 1.0000 | ORAL_CAPSULE | Freq: Every day | ORAL | 3 refills | Status: DC

## 2020-02-10 NOTE — Assessment & Plan Note (Signed)
Doing well on PRN Trazodone for which she takes once every 1-2 weeks. Continue same.

## 2020-02-10 NOTE — Assessment & Plan Note (Signed)
Chronic and stable on PRN Tylenol.  Encouraged weight loss, daily activity.

## 2020-02-10 NOTE — Progress Notes (Signed)
Subjective:   Marie Ruiz is a 74 y.o. female who presents for Medicare Annual (Subsequent) preventive examination.  Review of System: N/A      I connected with the patient today by telephone and verified that I am speaking with the correct person using two identifiers. Location patient: home Location nurse: work Persons participating in the virtual visit: patient, Marine scientist.   I discussed the limitations, risks, security and privacy concerns of performing an evaluation and management service by telephone and the availability of in person appointments. I also discussed with the patient that there may be a patient responsible charge related to this service. The patient expressed understanding and verbally consented to this telephonic visit.    Interactive audio and video telecommunications were attempted between this nurse and patient, however failed, due to patient having technical difficulties OR patient did not have access to video capability.  We continued and completed visit with audio only.     Cardiac Risk Factors include: advanced age (>49mn, >>40women);hypertension;dyslipidemia     Objective:    Today's Vitals   02/10/20 1526 02/10/20 1529  BP: 124/76   Pulse: 65   Weight: 202 lb 8 oz (91.9 kg)   Height: _0  (1.676 m)   PainSc:  0-No pain   Body mass index is 32.68 kg/m.  Advanced Directives 02/10/2020 12/30/2018 06/05/2018 05/20/2018  Does Patient Have a Medical Advance Directive? Yes Yes No No  Type of [place] - -  Copy of [place] Chart? No - copy requested No - copy requested - -  Would patient like information on creating a medical advance directive? - - No - Patient declined No - Patient declined    Current Medications (verified) Outpatient Encounter Medications as of 02/10/2020  Medication Sig  . acetaminophen (TYLENOL) 500 MG tablet Take  500 mg by mouth as needed for mild pain.  .Marland KitchenamLODipine-benazepril (LOTREL) 5-20 MG capsule Take 1 capsule by mouth daily. For blood pressure.  .Marland Kitchenatorvastatin (LIPITOR) 40 MG tablet Take 1 tablet (40 mg total) by mouth daily. For cholesterol.  .Marland Kitchenb complex vitamins capsule Take 1 capsule by mouth daily.  . brimonidine (ALPHAGAN) 0.2 % ophthalmic solution 1 drop 2 (two) times daily.  . Calcium Carbonate-Vit D-Min (CALCIUM 1200 PO) Take by mouth.  . cetirizine (ZYRTEC) 10 MG tablet Take 10 mg by mouth daily.  . cholecalciferol (VITAMIN D) 1000 units tablet Take 1,000 Units by mouth daily.  . Coenzyme Q10 (COQ10 PO) Take 300 mg by mouth daily.  . dorzolamide-timolol (COSOPT) 22.3-6.8 MG/ML ophthalmic solution Place 1 drop into both eyes 2 (two) times daily.  .Marland KitchenFLUoxetine (PROZAC) 10 MG capsule Take 1 capsule by mouth once daily for anxiety.  .Marland Kitchenlatanoprost (XALATAN) 0.005 % ophthalmic solution Place 1 drop into both eyes at bedtime.  .Marland KitchenMAGNESIUM PO Take by mouth.  . Multiple Vitamin (MULTIVITAMIN) tablet Take 1 tablet by mouth daily.  . Olopatadine HCl (PATADAY OP) Apply to eye.  .Marland KitchenPropylene Glycol (SYSTANE BALANCE OP) Apply to eye.  . traZODone (DESYREL) 50 MG tablet Take 1 tablet (50 mg total) by mouth at bedtime as needed for sleep.  .Marland Kitchentrolamine salicylate (ASPERCREME) 10 % cream Apply 1 application topically as needed for muscle pain.  .Marland KitchenZoster Vaccine Adjuvanted ([facility] injection Inject 0.5 mLs into the muscle once for 1 dose. Repeat with second dose 2-6 months after initial.  No facility-administered encounter medications on file as of 02/10/2020.    Allergies (verified) Amitriptyline, Codeine, and Prednisone   History: Past Medical History:  Diagnosis Date  . Allergy    seasonal  . Anxiety   . Aortic aneurysm ([place]) 08/2018  . Arthritis   . Blood clotting disorder ([place])   . Bone spur    with excision  . Chickenpox   . Colon polyps   . DVT (deep venous thrombosis) ([place])   .  Glaucoma    both  . Graves disease 03/30/2013  . History of blood clots 10/2017  . Hyperlipemia   . Hypertension   . Osteopenia   . Prediabetes   . Restless leg syndrome    Past Surgical History:  Procedure Laterality Date  . BREAST CYST ASPIRATION    . CATARACT EXTRACTION W/ INTRAOCULAR LENS IMPLANT Right 2015  . CATARACT EXTRACTION W/PHACO Left 2017  . COLONOSCOPY  2014  . DILATION AND CURETTAGE OF UTERUS     Family History  Problem Relation Age of Onset  . Heart disease Mother   . Heart attack Father   . Breast cancer Sister 3  . Colon polyps Sister   . Aneurysm Brother   . Multiple myeloma Sister        found in stage 4  . Breast cancer Maternal Grandmother   . Esophageal cancer Maternal Aunt 82  . Colon cancer Neg Hx   . Rectal cancer Neg Hx   . Stomach cancer Neg Hx    Social History   Socioeconomic History  . Marital status: Married    Spouse name: Not on file  . Number of children: Not on file  . Years of education: Not on file  . Highest education level: Not on file  Occupational History  . Occupation: Retired  Tobacco Use  . Smoking status: Former Smoker    Quit date: 1990    Years since quitting: 31.5  . Smokeless tobacco: Never Used  Vaping Use  . Vaping Use: Never used  Substance and Sexual Activity  . Alcohol use: Yes    Alcohol/week: 2.0 standard drinks    Types: 2 Standard drinks or equivalent per week  . Drug use: Not Currently  . Sexual activity: Not Currently  Other Topics Concern  . Not on file  Social History Narrative   Lives in [place], moving to [place].   Retired.   Married.    Social Determinants of Health   Financial Resource Strain: Low Risk   . Difficulty of Paying Living Expenses: Not hard at all  Food Insecurity: No Food Insecurity  . Worried About Charity fundraiser in the Last Year: Never true  . Ran Out of Food in the Last Year: Never true  Transportation Needs: No Transportation Needs  . Lack of Transportation  (Medical): No  . Lack of Transportation (Non-Medical): No  Physical Activity: Inactive  . Days of Exercise per Week: 0 days  . Minutes of Exercise per Session: 0 min  Stress: No Stress Concern Present  . Feeling of Stress : Not at all  Social Connections:   . Frequency of Communication with Friends and Family:   . Frequency of Social Gatherings with Friends and Family:   . Attends Religious Services:   . Active Member of Clubs or Organizations:   . Attends Archivist Meetings:   Marland Kitchen Marital Status:     Tobacco Counseling Counseling given: Not Answered   Clinical Intake:  Pre-visit  preparation completed: Yes  Pain : No/denies pain Pain Score: 0-No pain     Nutritional Risks: None Diabetes: No  How often do you need to have someone help you when you read instructions, pamphlets, or other written materials from your doctor or pharmacy?: 1 - Never What is the last grade level you completed in school?: 2 years of college  Diabetic: No Nutrition Risk Assessment:  Has the patient had any N/V/D within the last 2 months?  No  Does the patient have any non-healing wounds?  No  Has the patient had any unintentional weight loss or weight gain?  No   Diabetes:  Is the patient diabetic?  No  If diabetic, was a CBG obtained today?  No  Did the patient bring in their glucometer from home?  No  How often do you monitor your CBG's? N/A.   Financial Strains and Diabetes Management:  Are you having any financial strains with the device, your supplies or your medication? No .  Does the patient want to be seen by Chronic Care Management for management of their diabetes?  No  Would the patient like to be referred to a Nutritionist or for Diabetic Management?  No      Interpreter Needed?: No  Information entered by :: CJohnson, LPN   Activities of Daily Living In your present state of health, do you have any difficulty performing the following activities: 02/10/2020    Hearing? N  Vision? N  Difficulty concentrating or making decisions? N  Walking or climbing stairs? N  Dressing or bathing? N  Doing errands, shopping? N  Preparing Food and eating ? N  Using the Toilet? N  In the past six months, have you accidently leaked urine? N  Do you have problems with loss of bowel control? N  Managing your Medications? N  Managing your Finances? N  Housekeeping or managing your Housekeeping? N  Some recent data might be hidden    Patient Care Team: Pleas [name], NP as PCP - General (Internal Medicine) Elouise Munroe, MD as PCP - Cardiology (Cardiology)  Indicate any recent Medical Services you may have received from other than [facility] providers in the past year (date may be approximate).     Assessment:   This is a routine wellness examination for [place].  Hearing/Vision screen  Hearing Screening   _0  _1  _2  _3  _4  _5  _6  _7  _8   Right ear:           Left ear:           Vision Screening Comments: Patient gets annual eye exams  Dietary issues and exercise activities discussed: Current Exercise Habits: The patient does not participate in regular exercise at present, Exercise limited by: None identified  Goals    . Patient Stated     Starting 12/30/18, I will continue to take medications as prescribed.     . Patient Stated     02/10/2020, I will maintain and continue medications as prescribed.       Depression Screen PHQ 2/9 Scores 02/10/2020 12/30/2018  PHQ - 2 Score 0 0  PHQ- 9 Score 0 0    Fall Risk Fall Risk  02/10/2020 12/30/2018 05/06/2018  Falls in the past year? 0 1 No  Comment - fell out of a chair -  Number falls in past yr: 0 0 -  Injury with Fall? 0 0 -  Risk for fall due to : No Fall Risks - -  Follow  up Falls evaluation completed;Falls prevention discussed - -    Any stairs in or around the home? Yes  If so, are there any without handrails? No  Home free of loose throw rugs in walkways,  pet beds, electrical cords, etc? Yes  Adequate lighting in your home to reduce risk of falls? Yes   ASSISTIVE DEVICES UTILIZED TO PREVENT FALLS:  Life alert? No  Use of a cane, walker or w/c? No  Grab bars in the bathroom? Yes  Shower chair or bench in shower? No  Elevated toilet seat or a handicapped toilet? No   TIMED UP AND GO:  Was the test performed? N/A, telephonic visit .    Cognitive Function: MMSE - Mini Mental State Exam 02/10/2020 12/30/2018  Orientation to time 5 5  Orientation to Place 5 5  Registration 3 3  Attention/ Calculation 5 0  Recall 3 3  Language- name 2 objects - 0  Language- repeat 1 1  Language- follow 3 step command - 0  Language- read & follow direction - 0  Write a sentence - 0  Copy design - 0  Total score - 17  Mini Cog  Mini-Cog screen was completed. Maximum score is 22. A value of 0 denotes this part of the MMSE was not completed or the patient failed this part of the Mini-Cog screening.       Immunizations Immunization History  Administered Date(s) Administered  . Fluad Quad(high Dose 65+) 03/31/2019  . Influenza, High Dose Seasonal PF 04/27/2014, 05/01/2015, 05/02/2016, 05/06/2017, 04/22/2018  . Influenza-Unspecified 03/31/2019  . PFIZER SARS-COV-2 Vaccination 09/18/2019, 10/05/2019  . Pneumococcal Conjugate-13 04/27/2015, 02/12/2016  . Pneumococcal Polysaccharide-23 06/20/2014  . Tdap 06/19/2016  . Zoster 02/18/2013    TDAP status: Up to date Flu Vaccine status: Up to date Pneumococcal vaccine status: Up to date Covid-19 vaccine status: Completed vaccines  Qualifies for Shingles Vaccine? Yes   Zostavax completed Yes   Shingrix Completed?: No.    Education has been provided regarding the importance of this vaccine. Patient has been advised to call insurance company to determine out of pocket expense if they have not yet received this vaccine. Advised may also receive vaccine at local pharmacy or Health Dept. Verbalized  acceptance and understanding.  Screening Tests Health Maintenance  Topic Date Due  . INFLUENZA VACCINE  02/20/2020  . MAMMOGRAM  08/02/2021  . TETANUS/TDAP  06/19/2026  . COLONOSCOPY  01/25/2030  . DEXA SCAN  Completed  . COVID-19 Vaccine  Completed  . Hepatitis C Screening  Completed  . PNA vac Low Risk Adult  Completed    Health Maintenance  There are no preventive care reminders to display for this patient.  Colorectal cancer screening: Completed 01/26/2020. Repeat every 10 years Mammogram status: Completed 08/13/2019. Repeat every year Bone Density status: Completed 07/16/2018. Results reflect: Bone density results: OSTEOPENIA. Repeat every 2 years.  Lung Cancer Screening: (Low Dose CT Chest recommended if Age 74-80 years, 30 pack-year currently smoking OR have quit w/in 15years.) does not qualify.    Additional Screening:  Hepatitis C Screening: does qualify; Completed 01/25/2019  Vision Screening: Recommended annual ophthalmology exams for early detection of glaucoma and other disorders of the eye. Is the patient up to date with their annual eye exam?  Yes  Who is the provider or what is the name of the office in which the patient attends annual eye exams? Dr. Rolanda Jay If pt is not established with a provider, would they like to be  referred to a provider to establish care? No .   Dental Screening: Recommended annual dental exams for proper oral hygiene  Community Resource Referral / Chronic Care Management: CRR required this visit?  No   CCM required this visit?  No      Plan:     I have personally reviewed and noted the following in the patient's chart:   . Medical and social history . Use of alcohol, tobacco or illicit drugs  . Current medications and supplements . Functional ability and status . Nutritional status . Physical activity . Advanced directives . List of other physicians . Hospitalizations, surgeries, and ER visits in previous 12  months . Vitals . Screenings to include cognitive, depression, and falls . Referrals and appointments  In addition, I have reviewed and discussed with patient certain preventive protocols, quality metrics, and best practice recommendations. A written personalized care plan for preventive services as well as general preventive health recommendations were provided to patient.   Due to this being a telephonic visit, the after visit summary with patients personalized plan was offered to patient via mail or my-chart. Patient preferred to pick up at office at next visit.   Andrez Grime, LPN   [phone]

## 2020-02-10 NOTE — Assessment & Plan Note (Signed)
Well controlled on amlodipine-benazepril, CMP pending. Continue same.

## 2020-02-10 NOTE — Progress Notes (Signed)
Subjective:    Patient ID: Marie Ruiz, female    DOB: 1946-04-27, 74 y.o.   MRN: [id number]  HPI  This visit occurred during the SARS-CoV-2 public health emergency.  Safety protocols were in place, including screening questions prior to the visit, additional usage of staff PPE, and extensive cleaning of exam room while observing appropriate contact time as indicated for disinfecting solutions.   Marie Ruiz is a 74 year old female who presents today for [place] Part 2 and follow up of chronic conditions.   Immunizations: -Tetanus: Completed in 2017 -Influenza: Due this season  -Shingles: Completed Zostavax in 2014 -Pneumonia: Completed Prevnar in 2017, Pneumovax in 2015 -Covid-19: Completed series   Mammogram: Completed in 2021 Dexa: Completed in 2019 Colonoscopy: Completed in 2021, no need to repeat  Hep C Screen: Negative    BP Readings from Last 3 Encounters:  02/10/20 124/76  01/26/20 125/76  08/23/19 118/72      Review of Systems  Respiratory: Negative for shortness of breath.   Cardiovascular: Negative for chest pain.  Musculoskeletal: Positive for arthralgias.  Skin:       Multiple nevi  Neurological: Negative for dizziness and headaches.  Psychiatric/Behavioral: The patient is not nervous/anxious.        Past Medical History:  Diagnosis Date  . Allergy    seasonal  . Anxiety   . Aortic aneurysm ([place]) 08/2018  . Arthritis   . Blood clotting disorder ([place])   . Bone spur    with excision  . Chickenpox   . Colon polyps   . DVT (deep venous thrombosis) ([place])   . Glaucoma    both  . Graves disease 03/30/2013  . History of blood clots 10/2017  . Hyperlipemia   . Hypertension   . Osteopenia   . Prediabetes   . Restless leg syndrome      Social History   Socioeconomic History  . Marital status: Married    Spouse name: Not on file  . Number of children: Not on file  . Years of education: Not on file  . Highest education level: Not on  file  Occupational History  . Occupation: Retired  Tobacco Use  . Smoking status: Former Smoker    Quit date: 1990    Years since quitting: 31.5  . Smokeless tobacco: Never Used  Vaping Use  . Vaping Use: Never used  Substance and Sexual Activity  . Alcohol use: Yes    Alcohol/week: 2.0 standard drinks    Types: 2 Standard drinks or equivalent per week  . Drug use: Not Currently  . Sexual activity: Not Currently  Other Topics Concern  . Not on file  Social History Narrative   Lives in [place], moving to [place].   Retired.   Married.    Social Determinants of Health   Financial Resource Strain:   . Difficulty of Paying Living Expenses:   Food Insecurity:   . Worried About Charity fundraiser in the Last Year:   . Arboriculturist in the Last Year:   Transportation Needs:   . Film/video editor (Medical):   Marland Kitchen Lack of Transportation (Non-Medical):   Physical Activity:   . Days of Exercise per Week:   . Minutes of Exercise per Session:   Stress:   . Feeling of Stress :   Social Connections:   . Frequency of Communication with Friends and Family:   . Frequency of Social Gatherings with Friends and Family:   .  Attends Religious Services:   . Active Member of Clubs or Organizations:   . Attends Archivist Meetings:   Marland Kitchen Marital Status:   Intimate Partner Violence:   . Fear of Current or Ex-Partner:   . Emotionally Abused:   Marland Kitchen Physically Abused:   . Sexually Abused:     Past Surgical History:  Procedure Laterality Date  . BREAST CYST ASPIRATION    . CATARACT EXTRACTION W/ INTRAOCULAR LENS IMPLANT Right 2015  . CATARACT EXTRACTION W/PHACO Left 2017  . COLONOSCOPY  2014  . DILATION AND CURETTAGE OF UTERUS      Family History  Problem Relation Age of Onset  . Heart disease Mother   . Heart attack Father   . Breast cancer Sister 69  . Colon polyps Sister   . Aneurysm Brother   . Multiple myeloma Sister        found in stage 4  . Breast cancer  Maternal Grandmother   . Esophageal cancer Maternal Aunt 82  . Colon cancer Neg Hx   . Rectal cancer Neg Hx   . Stomach cancer Neg Hx     Allergies  Allergen Reactions  . Amitriptyline Other (See Comments)    hallucinations  . Codeine   . Prednisone     Depression    Current Outpatient Medications on File Prior to Visit  Medication Sig Dispense Refill  . acetaminophen (TYLENOL) 500 MG tablet Take 500 mg by mouth as needed for mild pain.    Marland Kitchen b complex vitamins capsule Take 1 capsule by mouth daily.    . brimonidine (ALPHAGAN) 0.2 % ophthalmic solution 1 drop 2 (two) times daily.    . Calcium Carbonate-Vit D-Min (CALCIUM 1200 PO) Take by mouth.    . cetirizine (ZYRTEC) 10 MG tablet Take 10 mg by mouth daily.    . cholecalciferol (VITAMIN D) 1000 units tablet Take 1,000 Units by mouth daily.    . Coenzyme Q10 (COQ10 PO) Take 300 mg by mouth daily.    . dorzolamide-timolol (COSOPT) 22.3-6.8 MG/ML ophthalmic solution Place 1 drop into both eyes 2 (two) times daily.    Marland Kitchen latanoprost (XALATAN) 0.005 % ophthalmic solution Place 1 drop into both eyes at bedtime.    Marland Kitchen MAGNESIUM PO Take by mouth.    . Multiple Vitamin (MULTIVITAMIN) tablet Take 1 tablet by mouth daily.    . Olopatadine HCl (PATADAY OP) Apply to eye.    Marland Kitchen Propylene Glycol (SYSTANE BALANCE OP) Apply to eye.    . traZODone (DESYREL) 50 MG tablet Take 1 tablet (50 mg total) by mouth at bedtime as needed for sleep. 90 tablet 1  . trolamine salicylate (ASPERCREME) 10 % cream Apply 1 application topically as needed for muscle pain.     No current facility-administered medications on file prior to visit.    BP 124/76   Pulse 65   Temp (!) 95.8 F (35.4 C) (Temporal)   Ht '5\' 6"'$  (1.676 m)   Wt 202 lb 8 oz (91.9 kg)   SpO2 95%   BMI 32.68 kg/m    Objective:   Physical Exam Cardiovascular:     Rate and Rhythm: Normal rate and regular rhythm.  Pulmonary:     Effort: Pulmonary effort is normal.     Breath sounds: Normal  breath sounds.  Musculoskeletal:     Cervical back: Neck supple.  Skin:    General: Skin is warm and dry.  Assessment & Plan:

## 2020-02-10 NOTE — Progress Notes (Signed)
PCP notes:  Health Maintenance: No gaps noted    Abnormal Screenings: none   Patient concerns: none   Nurse concerns: none   Next PCP appt: none 

## 2020-02-10 NOTE — Assessment & Plan Note (Signed)
Stable, doing well on fluoxetine 10 mg. Continue same. Refills provided.

## 2020-02-10 NOTE — Patient Instructions (Signed)
Marie Ruiz , Thank you for taking time to come for your Medicare Wellness Visit. I appreciate your ongoing commitment to your health goals. Please review the following plan we discussed and let me know if I can assist you in the future.   Screening recommendations/referrals: Colonoscopy: Up to date, completed 01/26/2020, due 01/2030 Mammogram: Up to date, completed 08/13/2019 Bone Density: Up to date, completed 07/16/2018, due 06/2020 Recommended yearly ophthalmology/optometry visit for glaucoma screening and checkup Recommended yearly dental visit for hygiene and checkup  Vaccinations: Influenza vaccine: Up to date, completed 03/31/2019, due 02/2020 Pneumococcal vaccine: Completed series Tdap vaccine: Up to date, completed 06/19/2016, due 05/2026 Shingles vaccine: due, check with your insurance regarding coverage   Covid-19:Completed series  Advanced directives: Please bring a copy of your POA (Power of Attorney) and/or Living Will to your next appointment.   Conditions/risks identified: hypertension, hyperlipidemia  Next appointment: Follow up in one year for your annual wellness visit    Preventive Care 40 Years and Older, Female Preventive care refers to lifestyle choices and visits with your health care provider that can promote health and wellness. What does preventive care include?  A yearly physical exam. This is also called an annual well check.  Dental exams once or twice a year.  Routine eye exams. Ask your health care provider how often you should have your eyes checked.  Personal lifestyle choices, including:  Daily care of your teeth and gums.  Regular physical activity.  Eating a healthy diet.  Avoiding tobacco and drug use.  Limiting alcohol use.  Practicing safe sex.  Taking low-dose aspirin every day.  Taking vitamin and mineral supplements as recommended by your health care provider. What happens during an annual well check? The services and  screenings done by your health care provider during your annual well check will depend on your age, overall health, lifestyle risk factors, and family history of disease. Counseling  Your health care provider may ask you questions about your:  Alcohol use.  Tobacco use.  Drug use.  Emotional well-being.  Home and relationship well-being.  Sexual activity.  Eating habits.  History of falls.  Memory and ability to understand (cognition).  Work and work Statistician.  Reproductive health. Screening  You may have the following tests or measurements:  Height, weight, and BMI.  Blood pressure.  Lipid and cholesterol levels. These may be checked every 5 years, or more frequently if you are over 85 years old.  Skin check.  Lung cancer screening. You may have this screening every year starting at age 76 if you have a 30-pack-year history of smoking and currently smoke or have quit within the past 15 years.  Fecal occult blood test (FOBT) of the stool. You may have this test every year starting at age 69.  Flexible sigmoidoscopy or colonoscopy. You may have a sigmoidoscopy every 5 years or a colonoscopy every 10 years starting at age 39.  Hepatitis C blood test.  Hepatitis B blood test.  Sexually transmitted disease (STD) testing.  Diabetes screening. This is done by checking your blood sugar (glucose) after you have not eaten for a while (fasting). You may have this done every 1-3 years.  Bone density scan. This is done to screen for osteoporosis. You may have this done starting at age 79.  Mammogram. This may be done every 1-2 years. Talk to your health care provider about how often you should have regular mammograms. Talk with your health care provider about your test results,  treatment options, and if necessary, the need for more tests. Vaccines  Your health care provider may recommend certain vaccines, such as:  Influenza vaccine. This is recommended every  year.  Tetanus, diphtheria, and acellular pertussis (Tdap, Td) vaccine. You may need a Td booster every 10 years.  Zoster vaccine. You may need this after age 73.  Pneumococcal 13-valent conjugate (PCV13) vaccine. One dose is recommended after age 72.  Pneumococcal polysaccharide (PPSV23) vaccine. One dose is recommended after age 70. Talk to your health care provider about which screenings and vaccines you need and how often you need them. This information is not intended to replace advice given to you by your health care provider. Make sure you discuss any questions you have with your health care provider. Document Released: 08/04/2015 Document Revised: 03/27/2016 Document Reviewed: 05/09/2015 Elsevier Interactive Patient Education  2017 [place] Prevention in the Home Falls can cause injuries. They can happen to people of all ages. There are many things you can do to make your home safe and to help prevent falls. What can I do on the outside of my home?  Regularly fix the edges of walkways and driveways and fix any cracks.  Remove anything that might make you trip as you walk through a door, such as a raised step or threshold.  Trim any bushes or trees on the path to your home.  Use bright outdoor lighting.  Clear any walking paths of anything that might make someone trip, such as rocks or tools.  Regularly check to see if handrails are loose or broken. Make sure that both sides of any steps have handrails.  Any raised decks and porches should have guardrails on the edges.  Have any leaves, snow, or ice cleared regularly.  Use sand or salt on walking paths during winter.  Clean up any spills in your garage right away. This includes oil or grease spills. What can I do in the bathroom?  Use night lights.  Install grab bars by the toilet and in the tub and shower. Do not use towel bars as grab bars.  Use non-skid mats or decals in the tub or shower.  If you  need to sit down in the shower, use a plastic, non-slip stool.  Keep the floor dry. Clean up any water that spills on the floor as soon as it happens.  Remove soap buildup in the tub or shower regularly.  Attach bath mats securely with double-sided non-slip rug tape.  Do not have throw rugs and other things on the floor that can make you trip. What can I do in the bedroom?  Use night lights.  Make sure that you have a light by your bed that is easy to reach.  Do not use any sheets or blankets that are too big for your bed. They should not hang down onto the floor.  Have a firm chair that has side arms. You can use this for support while you get dressed.  Do not have throw rugs and other things on the floor that can make you trip. What can I do in the kitchen?  Clean up any spills right away.  Avoid walking on wet floors.  Keep items that you use a lot in easy-to-reach places.  If you need to reach something above you, use a strong step stool that has a grab bar.  Keep electrical cords out of the way.  Do not use floor polish or wax that makes floors  slippery. If you must use wax, use non-skid floor wax.  Do not have throw rugs and other things on the floor that can make you trip. What can I do with my stairs?  Do not leave any items on the stairs.  Make sure that there are handrails on both sides of the stairs and use them. Fix handrails that are broken or loose. Make sure that handrails are as long as the stairways.  Check any carpeting to make sure that it is firmly attached to the stairs. Fix any carpet that is loose or worn.  Avoid having throw rugs at the top or bottom of the stairs. If you do have throw rugs, attach them to the floor with carpet tape.  Make sure that you have a light switch at the top of the stairs and the bottom of the stairs. If you do not have them, ask someone to add them for you. What else can I do to help prevent falls?  Wear shoes  that:  Do not have high heels.  Have rubber bottoms.  Are comfortable and fit you well.  Are closed at the toe. Do not wear sandals.  If you use a stepladder:  Make sure that it is fully opened. Do not climb a closed stepladder.  Make sure that both sides of the stepladder are locked into place.  Ask someone to hold it for you, if possible.  Clearly mark and make sure that you can see:  Any grab bars or handrails.  First and last steps.  Where the edge of each step is.  Use tools that help you move around (mobility aids) if they are needed. These include:  Canes.  Walkers.  Scooters.  Crutches.  Turn on the lights when you go into a dark area. Replace any light bulbs as soon as they burn out.  Set up your furniture so you have a clear path. Avoid moving your furniture around.  If any of your floors are uneven, fix them.  If there are any pets around you, be aware of where they are.  Review your medicines with your doctor. Some medicines can make you feel dizzy. This can increase your chance of falling. Ask your doctor what other things that you can do to help prevent falls. This information is not intended to replace advice given to you by your health care provider. Make sure you discuss any questions you have with your health care provider. Document Released: 05/04/2009 Document Revised: 12/14/2015 Document Reviewed: 08/12/2014 Elsevier Interactive Patient Education  2017 [employer].

## 2020-02-10 NOTE — Assessment & Plan Note (Signed)
Following with cardiology, reviewed echocardiogram from February 2021. Due for repeat imaging in August 2021.

## 2020-02-10 NOTE — Assessment & Plan Note (Signed)
Repeat lipids pending, compliant to atorvastatin.

## 2020-02-10 NOTE — Assessment & Plan Note (Signed)
Due for repeat Bone Density scan in late 2021 or early 2022. Will order along with mammogram that will be due in early 2022.  Compliant to calcium and vitamin D.  Encouraged weight bearing exercise.

## 2020-02-10 NOTE — Assessment & Plan Note (Signed)
Asymptomatic. Repeat TSH pending. 

## 2020-02-10 NOTE — Assessment & Plan Note (Signed)
Following with ophthalmology.  Continue drops.

## 2020-02-10 NOTE — Patient Instructions (Signed)
Stop by the lab prior to leaving today. I will notify you of your results once received.   Take the shingles vaccine to your pharmacy.  You are due for the bone density scan and mammogram in early 2022.  Start exercising. You should be getting 150 minutes of moderate intensity exercise weekly.  Continue to work on a healthy diet.   It was a pleasure to see you today!

## 2020-02-10 NOTE — Assessment & Plan Note (Signed)
Following with dermatology 

## 2020-02-11 ENCOUNTER — Other Ambulatory Visit (INDEPENDENT_AMBULATORY_CARE_PROVIDER_SITE_OTHER): Payer: Medicare Other

## 2020-02-11 DIAGNOSIS — R739 Hyperglycemia, unspecified: Secondary | ICD-10-CM

## 2020-02-11 LAB — HEMOGLOBIN A1C: Hgb A1c MFr Bld: 5.6 % (ref 4.6–6.5)

## 2020-02-22 ENCOUNTER — Other Ambulatory Visit: Payer: Self-pay

## 2020-02-22 ENCOUNTER — Ambulatory Visit (INDEPENDENT_AMBULATORY_CARE_PROVIDER_SITE_OTHER): Payer: Medicare Other | Admitting: Internal Medicine

## 2020-02-22 ENCOUNTER — Encounter: Payer: Self-pay | Admitting: Internal Medicine

## 2020-02-22 VITALS — BP 110/70 | HR 76 | Ht 66.0 in | Wt 205.2 lb

## 2020-02-22 DIAGNOSIS — I712 Thoracic aortic aneurysm, without rupture, unspecified: Secondary | ICD-10-CM

## 2020-02-22 DIAGNOSIS — I10 Essential (primary) hypertension: Secondary | ICD-10-CM

## 2020-02-22 DIAGNOSIS — M79604 Pain in right leg: Secondary | ICD-10-CM | POA: Diagnosis not present

## 2020-02-22 DIAGNOSIS — I700 Atherosclerosis of aorta: Secondary | ICD-10-CM | POA: Diagnosis not present

## 2020-02-22 DIAGNOSIS — R0600 Dyspnea, unspecified: Secondary | ICD-10-CM

## 2020-02-22 DIAGNOSIS — I82432 Acute embolism and thrombosis of left popliteal vein: Secondary | ICD-10-CM

## 2020-02-22 DIAGNOSIS — E785 Hyperlipidemia, unspecified: Secondary | ICD-10-CM

## 2020-02-22 DIAGNOSIS — R0609 Other forms of dyspnea: Secondary | ICD-10-CM

## 2020-02-22 DIAGNOSIS — R002 Palpitations: Secondary | ICD-10-CM | POA: Diagnosis not present

## 2020-02-22 DIAGNOSIS — M79605 Pain in left leg: Secondary | ICD-10-CM

## 2020-02-22 NOTE — Progress Notes (Signed)
Cardiology Office Note:    Date:  02/22/2020   ID:  OTTIS SARNOWSKI, DOB [birth year], MRN [id number]  PCP:  Pleas [name], NP  Cardiologist:  Elouise Munroe, MD  Electrophysiologist:  None   Referring MD: Pleas [name], NP   Chief Complaint: Follow-up thoracic aortic aneurysm  History of Present Illness:    Marie Ruiz is a 74 y.o. female with a history of DVT with completed anticoagulation, hypertension, hyperlipidemia who presents for follow-up thoracic aortic aneurysm.  We discussed her 40 mm aorta which is borderline abnormal and has appeared stable serially.  We discussed indications for serial follow-up and imaging modalities including echo and CT.  We discussed multiple concerns today including mild dyspnea with her activities, monthly palpitations, "to the bone pain" in her bilateral calves and left shoulder.  She was initially concerned that this pain was related to statin use, but but it happens infrequently (monthly or less) and she would like to observe it over time.  She is curious as to whether she has PAD but describes no classic features of claudication or rest pain.  I have offered her cardiac monitor to further evaluate her palpitations and have also offered her lower extremity arterial duplex study to further evaluate her leg pain, we participated in shared decision-making and both agree that this can be deferred at this time.  We again discussed thoracic aorta dilation precautions including avoiding heavy lifting (if you have to strain to lift it, that is too heavy).  Past Medical History:  Diagnosis Date  . Allergy    seasonal  . Anxiety   . Aortic aneurysm ([place]) 08/2018  . Arthritis   . Blood clotting disorder ([place])   . Bone spur    with excision  . Chickenpox   . Colon polyps   . DVT (deep venous thrombosis) ([place])   . Glaucoma    both  . Graves disease 03/30/2013  . History of blood clots 10/2017  . Hyperlipemia   . Hypertension    . Osteopenia   . Prediabetes   . Restless leg syndrome     Past Surgical History:  Procedure Laterality Date  . BREAST CYST ASPIRATION    . CATARACT EXTRACTION W/ INTRAOCULAR LENS IMPLANT Right 2015  . CATARACT EXTRACTION W/PHACO Left 2017  . COLONOSCOPY  2014  . DILATION AND CURETTAGE OF UTERUS      Current Medications: Current Meds  Medication Sig  . acetaminophen (TYLENOL) 500 MG tablet Take 500 mg by mouth as needed for mild pain.  Marland Kitchen amLODipine-benazepril (LOTREL) 5-20 MG capsule Take 1 capsule by mouth daily. For blood pressure.  Marland Kitchen atorvastatin (LIPITOR) 40 MG tablet Take 1 tablet (40 mg total) by mouth daily. For cholesterol.  Marland Kitchen b complex vitamins capsule Take 1 capsule by mouth daily.  . brimonidine (ALPHAGAN) 0.2 % ophthalmic solution 1 drop 2 (two) times daily.  . Calcium Carbonate-Vit D-Min (CALCIUM 1200 PO) Take by mouth.  . cetirizine (ZYRTEC) 10 MG tablet Take 10 mg by mouth daily.  . cholecalciferol (VITAMIN D) 1000 units tablet Take 1,000 Units by mouth daily.  . Coenzyme Q10 (COQ10 PO) Take 300 mg by mouth daily.  . dorzolamide-timolol (COSOPT) 22.3-6.8 MG/ML ophthalmic solution Place 1 drop into both eyes 2 (two) times daily.  Marland Kitchen FLUoxetine (PROZAC) 10 MG capsule Take 1 capsule by mouth once daily for anxiety.  Marland Kitchen latanoprost (XALATAN) 0.005 % ophthalmic solution Place 1 drop into both eyes at bedtime.  Marland Kitchen  MAGNESIUM PO Take by mouth.  . Multiple Vitamin (MULTIVITAMIN) tablet Take 1 tablet by mouth daily.  . Olopatadine HCl (PATADAY OP) Apply to eye.  Marland Kitchen Propylene Glycol (SYSTANE BALANCE OP) Apply to eye.  . traZODone (DESYREL) 50 MG tablet Take 1 tablet (50 mg total) by mouth at bedtime as needed for sleep.  Marland Kitchen trolamine salicylate (ASPERCREME) 10 % cream Apply 1 application topically as needed for muscle pain.     Allergies:   Amitriptyline, Codeine, and Prednisone   Social History   Socioeconomic History  . Marital status: Married    Spouse name: Not on  file  . Number of children: Not on file  . Years of education: Not on file  . Highest education level: Not on file  Occupational History  . Occupation: Retired  Tobacco Use  . Smoking status: Former Smoker    Quit date: 1990    Years since quitting: 31.6  . Smokeless tobacco: Never Used  Vaping Use  . Vaping Use: Never used  Substance and Sexual Activity  . Alcohol use: Yes    Alcohol/week: 2.0 standard drinks    Types: 2 Standard drinks or equivalent per week  . Drug use: Not Currently  . Sexual activity: Not Currently  Other Topics Concern  . Not on file  Social History Narrative   Lives in [place], moving to [place].   Retired.   Married.    Social Determinants of Health   Financial Resource Strain: Low Risk   . Difficulty of Paying Living Expenses: Not hard at all  Food Insecurity: No Food Insecurity  . Worried About Charity fundraiser in the Last Year: Never true  . Ran Out of Food in the Last Year: Never true  Transportation Needs: No Transportation Needs  . Lack of Transportation (Medical): No  . Lack of Transportation (Non-Medical): No  Physical Activity: Inactive  . Days of Exercise per Week: 0 days  . Minutes of Exercise per Session: 0 min  Stress: No Stress Concern Present  . Feeling of Stress : Not at all  Social Connections:   . Frequency of Communication with Friends and Family:   . Frequency of Social Gatherings with Friends and Family:   . Attends Religious Services:   . Active Member of Clubs or Organizations:   . Attends Archivist Meetings:   Marland Kitchen Marital Status:      Family History: The patient's family history includes Aneurysm in her brother; Breast cancer in her maternal grandmother; Breast cancer (age of onset: 75) in her sister; Colon polyps in her sister; Esophageal cancer (age of onset: 3) in her maternal aunt; Heart attack in her father; Heart disease in her mother; Multiple myeloma in her sister. There is no history of Colon cancer,  Rectal cancer, or Stomach cancer.  ROS:   Please see the history of present illness.    All other systems reviewed and are negative.  EKGs/Labs/Other Studies Reviewed:    The following studies were reviewed today:  Recent Labs: 02/10/2020: ALT 13; BUN 11; Creatinine, Ser 0.72; Hemoglobin 13.3; Platelets 273.0; Potassium 4.3; Sodium 139; TSH 2.42  Recent Lipid Panel    Component Value Date/Time   CHOL 173 02/10/2020 1053   TRIG 185.0 (H) 02/10/2020 1053   HDL 57.10 02/10/2020 1053   CHOLHDL 3 02/10/2020 1053   VLDL 37.0 02/10/2020 1053   LDLCALC 79 02/10/2020 1053    Physical Exam:    VS:  BP 110/70 (BP Location: Left  Arm, Patient Position: Sitting, Cuff Size: Large)   Pulse 76   Ht _0  (1.676 m)   Wt 205 lb 3.2 oz (93.1 kg)   BMI 33.12 kg/m     Wt Readings from Last 5 Encounters:  02/22/20 205 lb 3.2 oz (93.1 kg)  02/10/20 202 lb 8 oz (91.9 kg)  02/10/20 202 lb 8 oz (91.9 kg)  01/26/20 206 lb (93.4 kg)  01/11/20 206 lb (93.4 kg)     Constitutional: No acute distress Eyes: sclera non-icteric, normal conjunctiva and lids ENMT: normal dentition, moist mucous membranes Cardiovascular: regular rhythm, normal rate, no murmurs. S1 and S2 normal. Radial pulses normal bilaterally. No jugular venous distention.  Respiratory: clear to auscultation bilaterally GI : normal bowel sounds, soft and nontender. No distention.   MSK: extremities warm, well perfused. No edema.  NEURO: grossly nonfocal exam, moves all extremities. PSYCH: alert and oriented x 3, normal mood and affect.   ASSESSMENT:    1. Thoracic aortic aneurysm without rupture ([place])   2. DOE (dyspnea on exertion)   3. Essential hypertension   4. Deep vein thrombosis (DVT) of popliteal vein of left lower extremity, unspecified chronicity (HCC)   5. Hyperlipidemia, unspecified hyperlipidemia type   6. Aortic atherosclerosis (HCC)   7. Palpitations   8. Pain in both lower extremities    PLAN:    Thoracic  aortic aneurysm without rupture ([place]) - Plan: ECHOCARDIOGRAM COMPLETE She has demonstrated stability of a borderline ascending aorta dilation over time, we will perform annual echocardiogram to reevaluate at next follow-up.  The patient is quite concerned about progression of dilation.  We have discussed natural history of thoracic aortic aneurysms and her relative stability over time without growth.  I have offered her an echocardiogram at the [facility]-monthmark today, however she is somewhat concerned about insurance coverage and will contact her insurance company.  If she would like to proceed, we can order an echocardiogram to reevaluate the aorta now though I have suggested to her that this could also wait till her next follow-up given stable measurements.  DOE (dyspnea on exertion)-she would like to observe this symptom.  Essential hypertension-excellent blood pressure control without symptoms of hypotension, continue amlodipine benazepril.  Consider transition to losartan for the benefits of ascending aorta dilation and possible cellular level slowing of dilation.  No change needed today, we had discussed this at a prior visit.  Deep vein thrombosis (DVT) of popliteal vein of left lower extremity, unspecified chronicity (HCC)-completed therapy with anticoagulation, no symptoms of recurrence  Hyperlipidemia, unspecified hyperlipidemia type -Continue atorvastatin 40 mg daily.  This is appropriate with known aortic atherosclerosis. Aortic atherosclerosis (HCC)  Palpitations-she would like to observe this symptom and will contact our office if she would like to pursue cardiac monitor.  Pain in both lower extremities-she is concerned about peripheral arterial disease but does not describe symptoms of claudication or rest pain.  Can consider lower extremity Doppler arterial studies, patient would like to defer and observe at this time.  Total time of encounter: 40 minutes total time of encounter,  including 35 minutes spent in face-to-face patient care on the date of this encounter. This time includes coordination of care and counseling regarding above mentioned problem list. Remainder of non-face-to-face time involved reviewing chart documents/testing relevant to the patient encounter and documentation in the medical record. I have independently reviewed documentation from referring provider.   GCherlynn Kaiser MD [place]  [facility]    Medication Adjustments/Labs and Tests  Ordered: Current medicines are reviewed at length with the patient today.  Concerns regarding medicines are outlined above.  Orders Placed This Encounter  Procedures  . ECHOCARDIOGRAM COMPLETE   No orders of the defined types were placed in this encounter.   Patient Instructions  Medication Instructions:  No Changes *If you need a refill on your cardiac medications before your next appointment, please call your pharmacy*   Lab Work: None ordered If you have labs (blood work) drawn today and your tests are completely normal, you will receive your results only by: Marland Kitchen MyChart Message (if you have MyChart) OR . A paper copy in the mail If you have any lab test that is abnormal or we need to change your treatment, we will call you to review the results.   Testing/Procedures: This will take place at [facility]. Be scheduled Feb 2022. Your physician has requested that you have an echocardiogram. Echocardiography is a painless test that uses sound waves to create images of your heart. It provides your doctor with information about the size and shape of your heart and how well your heart's chambers and valves are working. This procedure takes approximately one hour. There are no restrictions for this procedure.    Follow-Up: At [facility], you and your health needs are our priority.  As part of our continuing mission to provide you with exceptional heart care, we have  created designated Provider Care Teams.  These Care Teams include your primary Cardiologist (physician) and Advanced Practice Providers (APPs -  Physician Assistants and Nurse Practitioners) who all work together to provide you with the care you need, when you need it.  We recommend signing up for the patient portal called "MyChart".  Sign up information is provided on this After Visit Summary.  MyChart is used to connect with patients for Virtual Visits (Telemedicine).  Patients are able to view lab/test results, encounter notes, upcoming appointments, etc.  Non-urgent messages can be sent to your provider as well.   To learn more about what you can do with MyChart, go to [email or web address].    Your next appointment:   6 month(s), After Echo has been completed  The format for your next appointment:   In Person  Provider:   Cherlynn Kaiser, MD   Other Instructions Please let us know by calling the office if you would like or need the heart monitor.

## 2020-02-22 NOTE — Patient Instructions (Signed)
Medication Instructions:  No Changes *If you need a refill on your cardiac medications before your next appointment, please call your pharmacy*   Lab Work: None ordered If you have labs (blood work) drawn today and your tests are completely normal, you will receive your results only by: Marland Kitchen MyChart Message (if you have MyChart) OR . A paper copy in the mail If you have any lab test that is abnormal or we need to change your treatment, we will call you to review the results.   Testing/Procedures: This will take place at [facility]. Be scheduled Feb 2022. Your physician has requested that you have an echocardiogram. Echocardiography is a painless test that uses sound waves to create images of your heart. It provides your doctor with information about the size and shape of your heart and how well your heart's chambers and valves are working. This procedure takes approximately one hour. There are no restrictions for this procedure.    Follow-Up: At [facility], you and your health needs are our priority.  As part of our continuing mission to provide you with exceptional heart care, we have created designated Provider Care Teams.  These Care Teams include your primary Cardiologist (physician) and Advanced Practice Providers (APPs -  Physician Assistants and Nurse Practitioners) who all work together to provide you with the care you need, when you need it.  We recommend signing up for the patient portal called "MyChart".  Sign up information is provided on this After Visit Summary.  MyChart is used to connect with patients for Virtual Visits (Telemedicine).  Patients are able to view lab/test results, encounter notes, upcoming appointments, etc.  Non-urgent messages can be sent to your provider as well.   To learn more about what you can do with MyChart, go to [email or web address].    Your next appointment:   6 month(s), After Echo has been completed  The  format for your next appointment:   In Person  Provider:   Cherlynn Kaiser, MD   Other Instructions Please let us know by calling the office if you would like or need the heart monitor.

## 2020-04-14 DIAGNOSIS — Z23 Encounter for immunization: Secondary | ICD-10-CM | POA: Diagnosis not present

## 2020-04-25 DIAGNOSIS — Z23 Encounter for immunization: Secondary | ICD-10-CM | POA: Diagnosis not present

## 2020-06-20 DIAGNOSIS — H401133 Primary open-angle glaucoma, bilateral, severe stage: Secondary | ICD-10-CM | POA: Diagnosis not present

## 2020-07-04 ENCOUNTER — Other Ambulatory Visit: Payer: Self-pay | Admitting: Primary Care

## 2020-07-04 DIAGNOSIS — Z1231 Encounter for screening mammogram for malignant neoplasm of breast: Secondary | ICD-10-CM

## 2020-07-24 ENCOUNTER — Telehealth: Payer: Self-pay

## 2020-07-24 NOTE — Telephone Encounter (Signed)
[facility] Night - Client TELEPHONE ADVICE RECORD AccessNurse Patient Name: Marie Ruiz DATE Gender: Female DOB: 1945/08/04 Age: 75 Y 5 M 12 D Return Phone Number: [phone] (Primary), [phone] (Secondary) [place] [address] Client [facility] Night - Client Client Site [facility] - Night Physician Vernona Rieger - NP Contact Type Call Who Is Calling Patient / Member / Family / Caregiver Call Type Triage / Clinical Caller Name [name] Relationship To Patient Spouse Return Phone Number [phone] (Primary) Chief Complaint Cough Reason for Call Symptomatic / Request for Health Information Initial Comment caller states his wife tested positive for covid yesterday, she's congested with a bad cough with mucus - caller asking can he take her to [place] for the covid cocktail Translation No Nurse Assessment Nurse: Jacqulyn Bath, RN, Erin Date/Time (Eastern Time): 07/21/2020 8:19:44 AM Confirm and document reason for call. If symptomatic, describe symptoms. ---caller states his wife tested positive for covid yesterday, she's congested with a bad cough with mucus - caller asking can he take her to [place] for the covid cocktail, Temp of 97.1(Oral ). Feels very chilled this morning Does the patient have any new or worsening symptoms? ---Yes Will a triage be completed? ---Yes Related visit to physician within the last 2 weeks? ---No Does the PT have any chronic conditions? (i.e. diabetes, asthma, this includes High risk factors for pregnancy, etc.) ---Yes List chronic conditions. ---HTN, aneurysm (chest), hx of blood clot Is this a behavioral health or substance abuse call? ---No Guidelines Guideline Title Affirmed Question Affirmed Notes Nurse Date/Time (Eastern Time) COVID-19 - Diagnosed or Suspected HIGH RISK for severe COVID complications (e.g., age > 64  years, obesity with BMI > 25, pregnant, chronic lung disease or other chronic medical condition) (Exception: Already seen Long, RN, Erin 07/21/2020 8:22:41 AM PLEASE NOTE: All timestamps contained within this report are represented as [place] Standard Time. CONFIDENTIALTY NOTICE: This fax transmission is intended only for the addressee. It contains information that is legally privileged, confidential or otherwise protected from use or disclosure. If you are not the intended recipient, you are strictly prohibited from reviewing, disclosing, copying using or disseminating any of this information or taking any action in reliance on or regarding this information. If you have received this fax in error, please notify us immediately by telephone so that we can arrange for its return to [place]. Phone: [phone], Toll-Free: [phone], Fax: [phone] Page: 2 of 3 Call Id: [id number] Guidelines Guideline Title Affirmed Question Affirmed Notes Nurse Date/Time Lamount Cohen Time) by PCP and no new or worsening symptoms.) Disp. Time Lamount Cohen Time) Disposition Final User 07/21/2020 8:34:20 AM Paged On Call back to [facility], RN, Denny Peon 07/21/2020 8:56:45 AM Called On-Call Provider Long, RN, Erin 07/21/2020 9:09:02 AM Called On-Call Provider Long, RN, Erin 07/21/2020 8:31:13 AM Call PCP Now Yes Long, RN, Antony Madura Disagree/Comply Comply Caller Understands Yes PreDisposition Go to ED Care Advice Given Per Guideline CALL PCP NOW: * You need to discuss this with your doctor (or NP/PA). COUGH MEDICINES: * HOME REMEDY - HONEY: This old home remedy has been shown to help decrease coughing at night. The adult dosage is 2 teaspoons (10 ml) at bedtime. Honey should not be given to infants under one year of age. FEVER MEDICINES: * For fevers above 101 F (38.3 C) take either acetaminophen or ibuprofen. CALL BACK IF: * You become worse Comments User: [name], RN Date/Time (Eastern Time): 07/21/2020  8:25:22 AM It was a positve home test User: [name], RN Date/Time Lamount Cohen Time): 07/21/2020 8:27:28 AM Taking Tylenol and Coricidin Flu and cough User: [name], RN Date/Time Lamount Cohen Time): 07/21/2020 9:13:10 AM Called Caller back and gave MD instuctions Referrals REFERRED TO PCP OFFICE Paging DoctorName Phone DateTime Result/Outcome Message Type Notes Glory Buff [phone] 07/21/2020 8:34:20 AM Paged On Call Back to Call Center Doctor Paged Hi I am reaching out to you about a patient. You can reach me back at: [phone]. Thank you, Denny Peon RN Glory Buff [phone] 07/21/2020 8:56:45 AM Called On Call Provider - Reached Doctor Paged Glory Buff 07/21/2020 9:01:06 AM Spoke with On Call - General Message Result MD "Can probably get into COVID Clinic on Monday, OK to wait as long as not in any distress, Get Pulse OX Mucinnex, Delsum, Antihystamine, no decongestant. Flonase and SalineSpray. PLEASE NOTE: All timestamps contained within this report are represented as [place] Standard Time. CONFIDENTIALTY NOTICE: This fax transmission is intended only for the addressee. It contains information that is legally privileged, confidential or otherwise protected from use or disclosure. If you are not the intended recipient, you are strictly prohibited from reviewing, disclosing, copying using or disseminating any of this information or taking any action in reliance on or regarding this information. If you have received this fax in error, please notify us immediately by telephone so that we can arrange for its return to [place]. Phone: [phone], Toll-Free: [phone], Fax: [phone] Page: 3 of 3 Call Id: [id number] Paging DoctorName Phone DateTime Result/Outcome Message Type Notes Pulse OX must stay greater than 95% or go to the ED" Glory Buff [phone] 07/21/2020 9:09:02 AM Called On Call Provider - Reached Doctor  Paged Glory Buff 07/21/2020 9:10:32 AM Spoke with On Call - General Message Result Called MD back because caller told me she has Glaucoma. MD states is fine to take as long as no decongestant  Attempted to call patient to follow-up on this matter, but was unable because the voicemail was not set up.

## 2020-07-24 NOTE — Telephone Encounter (Signed)
If patient calls back would recommend virtual visit to discuss symptoms.   She could be referred for the monoclonal antibodies but due to supply shortage she will be less likely to receive them.

## 2020-07-25 NOTE — Telephone Encounter (Signed)
Patient called back and was advised as instructed. Patient stated that she has been taking some  OTC cold medication and she felt a little light headed this morning. Patient stated when she felt lightheaded her blood pressure was 144/96. Patient stated now her blood pressure is back to normal at 126/79 and she feels fine other than her covid systems. Patient stated that she has symptoms of a head cold. Patient denies SOB or difficulty breathing. Patient stated that she does not feel at this time that she needs to schedule a virtual visit and will see how she does over the next few days. Patient was given ER precautions and she verbalized understanding.

## 2020-07-25 NOTE — Telephone Encounter (Signed)
Noted  

## 2020-08-14 ENCOUNTER — Ambulatory Visit
Admission: RE | Admit: 2020-08-14 | Discharge: 2020-08-14 | Disposition: A | Discharge status: Home / Self Care | Payer: BC Managed Care – PPO | Source: Ambulatory Visit | Attending: Primary Care | Admitting: Primary Care

## 2020-08-14 ENCOUNTER — Ambulatory Visit (INDEPENDENT_AMBULATORY_CARE_PROVIDER_SITE_OTHER): Payer: Medicare Other | Admitting: Primary Care

## 2020-08-14 ENCOUNTER — Other Ambulatory Visit: Payer: Self-pay

## 2020-08-14 ENCOUNTER — Ambulatory Visit (INDEPENDENT_AMBULATORY_CARE_PROVIDER_SITE_OTHER)
Admission: RE | Admit: 2020-08-14 | Discharge: 2020-08-14 | Disposition: A | Discharge status: Home / Self Care | Payer: Medicare Other | Source: Ambulatory Visit | Attending: Primary Care | Admitting: Primary Care

## 2020-08-14 VITALS — BP 102/62 | HR 67 | Temp 97.3°F | Ht 66.0 in | Wt 198.0 lb

## 2020-08-14 DIAGNOSIS — E2839 Other primary ovarian failure: Secondary | ICD-10-CM

## 2020-08-14 DIAGNOSIS — I712 Thoracic aortic aneurysm, without rupture: Secondary | ICD-10-CM

## 2020-08-14 DIAGNOSIS — F411 Generalized anxiety disorder: Secondary | ICD-10-CM

## 2020-08-14 DIAGNOSIS — M79671 Pain in right foot: Secondary | ICD-10-CM

## 2020-08-14 DIAGNOSIS — I10 Essential (primary) hypertension: Secondary | ICD-10-CM | POA: Diagnosis not present

## 2020-08-14 DIAGNOSIS — M79672 Pain in left foot: Secondary | ICD-10-CM

## 2020-08-14 DIAGNOSIS — M8949 Other hypertrophic osteoarthropathy, multiple sites: Secondary | ICD-10-CM | POA: Diagnosis not present

## 2020-08-14 DIAGNOSIS — M159 Polyosteoarthritis, unspecified: Secondary | ICD-10-CM

## 2020-08-14 DIAGNOSIS — I7121 Aneurysm of the ascending aorta, without rupture: Secondary | ICD-10-CM

## 2020-08-14 DIAGNOSIS — E785 Hyperlipidemia, unspecified: Secondary | ICD-10-CM

## 2020-08-14 NOTE — Progress Notes (Signed)
Subjective:    Patient ID: Marie Ruiz, female    DOB: October 27, 1945, 75 y.o.   MRN: [id number]  HPI  This visit occurred during the SARS-CoV-2 public health emergency.  Safety protocols were in place, including screening questions prior to the visit, additional usage of staff PPE, and extensive cleaning of exam room while observing appropriate contact time as indicated for disinfecting solutions.   Ms. Mesquita is a 75 year old female with a history of hyperlipidemia, hypertension, ascending aortic aneurysm, DVT, osteoarthritis, GAD, glaucoma who presents today for follow up.  1) Heel Pain: Acute for the last month occurs to both heels. She describes her pain as an achy/sore feeling. She has tried one pair of heel cups without improvement. She denies injury, increased activity on her feet.   2) Anxiety: Currently managed on fluoxetine 10 mg, doing well on this dose and regimen.   3) Arthralgia: Chronic to bilateral hips, lower extremities, feet, knees. Currently managed on atorvastatin 40 mg for history of aortic aneurysm. Atorvastatin was increased by her cardiologist to 40 mg for 1-2 years. She's wondering if some of her joint aches are secondary to the atorvastatin.   BP Readings from Last 3 Encounters:  08/14/20 102/62  02/22/20 110/70  02/10/20 124/76     Review of Systems  Musculoskeletal: Positive for arthralgias.       Chronic bilateral hip pain, acute bilateral heel pain  Psychiatric/Behavioral:       See HPI       Past Medical History:  Diagnosis Date  . Allergy    seasonal  . Anxiety   . Aortic aneurysm ([place]) 08/2018  . Arthritis   . Blood clotting disorder ([place])   . Bone spur    with excision  . Chickenpox   . Colon polyps   . DVT (deep venous thrombosis) ([place])   . Glaucoma    both  . Graves disease 03/30/2013  . History of blood clots 10/2017  . Hyperlipemia   . Hypertension   . Osteopenia   . Prediabetes   . Restless leg syndrome       Social History   Socioeconomic History  . Marital status: Married    Spouse name: Not on file  . Number of children: Not on file  . Years of education: Not on file  . Highest education level: Not on file  Occupational History  . Occupation: Retired  Tobacco Use  . Smoking status: Former Smoker    Quit date: 1990    Years since quitting: 32.0  . Smokeless tobacco: Never Used  Vaping Use  . Vaping Use: Never used  Substance and Sexual Activity  . Alcohol use: Yes    Alcohol/week: 2.0 standard drinks    Types: 2 Standard drinks or equivalent per week  . Drug use: Not Currently  . Sexual activity: Not Currently  Other Topics Concern  . Not on file  Social History Narrative   Lives in [place], moving to [place].   Retired.   Married.    Social Determinants of Health   Financial Resource Strain: Low Risk   . Difficulty of Paying Living Expenses: Not hard at all  Food Insecurity: No Food Insecurity  . Worried About Charity fundraiser in the Last Year: Never true  . Ran Out of Food in the Last Year: Never true  Transportation Needs: No Transportation Needs  . Lack of Transportation (Medical): No  . Lack of Transportation (Non-Medical): No  Physical  Activity: Inactive  . Days of Exercise per Week: 0 days  . Minutes of Exercise per Session: 0 min  Stress: No Stress Concern Present  . Feeling of Stress : Not at all  Social Connections: Not on file  Intimate Partner Violence: Not At Risk  . Fear of Current or Ex-Partner: No  . Emotionally Abused: No  . Physically Abused: No  . Sexually Abused: No    Past Surgical History:  Procedure Laterality Date  . BREAST CYST ASPIRATION    . CATARACT EXTRACTION W/ INTRAOCULAR LENS IMPLANT Right 2015  . CATARACT EXTRACTION W/PHACO Left 2017  . COLONOSCOPY  2014  . DILATION AND CURETTAGE OF UTERUS      Family History  Problem Relation Age of Onset  . Heart disease Mother   . Heart attack Father   . Breast cancer Sister 79  .  Colon polyps Sister   . Aneurysm Brother   . Multiple myeloma Sister        found in stage 4  . Breast cancer Maternal Grandmother   . Esophageal cancer Maternal Aunt 82  . Colon cancer Neg Hx   . Rectal cancer Neg Hx   . Stomach cancer Neg Hx     Allergies  Allergen Reactions  . Amitriptyline Other (See Comments)    hallucinations  . Codeine   . Prednisone     Depression    Current Outpatient Medications on File Prior to Visit  Medication Sig Dispense Refill  . acetaminophen (TYLENOL) 500 MG tablet Take 500 mg by mouth as needed for mild pain.    Marland Kitchen amLODipine-benazepril (LOTREL) 5-20 MG capsule Take 1 capsule by mouth daily. For blood pressure. 90 capsule 3  . atorvastatin (LIPITOR) 40 MG tablet Take 1 tablet (40 mg total) by mouth daily. For cholesterol. 90 tablet 3  . b complex vitamins capsule Take 1 capsule by mouth daily.    . brimonidine (ALPHAGAN) 0.2 % ophthalmic solution 1 drop 2 (two) times daily.    . Calcium Carbonate-Vit D-Min (CALCIUM 1200 PO) Take by mouth.    . cetirizine (ZYRTEC) 10 MG tablet Take 10 mg by mouth daily.    . cholecalciferol (VITAMIN D) 1000 units tablet Take 1,000 Units by mouth daily.    . Coenzyme Q10 (COQ10 PO) Take 300 mg by mouth daily.    . dorzolamide-timolol (COSOPT) 22.3-6.8 MG/ML ophthalmic solution Place 1 drop into both eyes 2 (two) times daily.    Marland Kitchen FLUoxetine (PROZAC) 10 MG capsule Take 1 capsule by mouth once daily for anxiety. 90 capsule 3  . latanoprost (XALATAN) 0.005 % ophthalmic solution Place 1 drop into both eyes at bedtime.    Marland Kitchen MAGNESIUM PO Take by mouth.    . Multiple Vitamin (MULTIVITAMIN) tablet Take 1 tablet by mouth daily.    . Olopatadine HCl (PATADAY OP) Apply to eye.    Marland Kitchen Propylene Glycol (SYSTANE BALANCE OP) Apply to eye.    . traZODone (DESYREL) 50 MG tablet Take 1 tablet (50 mg total) by mouth at bedtime as needed for sleep. 90 tablet 1  . trolamine salicylate (ASPERCREME) 10 % cream Apply 1 application  topically as needed for muscle pain.     No current facility-administered medications on file prior to visit.    BP 102/62 (BP Location: Left Arm, Patient Position: Sitting, Cuff Size: Large)   Pulse 67   Temp (!) 97.3 F (36.3 C) (Oral)   Ht _0  (1.676 m)   Wt 198  lb (89.8 kg)   SpO2 97%   BMI 31.96 kg/m    Objective:   Physical Exam Constitutional:      Appearance: She is well-nourished.  Cardiovascular:     Rate and Rhythm: Normal rate and regular rhythm.  Pulmonary:     Effort: Pulmonary effort is normal.     Breath sounds: Normal breath sounds.  Musculoskeletal:     Cervical back: Neck supple.  Skin:    General: Skin is warm and dry.  Psychiatric:        Mood and Affect: Mood and affect normal.            Assessment & Plan:

## 2020-08-14 NOTE — Assessment & Plan Note (Signed)
Arthralgias which could very well be secondary to arthritis, atorvastatin could be contributing. We discussed a 2 week hold to see if symptoms improve. If symptoms improve then would recommend Crestor. She will update.

## 2020-08-14 NOTE — Assessment & Plan Note (Signed)
Follows closely with cardiology, continue to monitor.  Continue statin therapy.

## 2020-08-14 NOTE — Assessment & Plan Note (Signed)
She is holding atorvastatin 40 mg x 2 weeks to learn if arthralgias improve. She will update.   If improved then consider Crestor.

## 2020-08-14 NOTE — Assessment & Plan Note (Signed)
Controlled in the office toady, continue amlodipine-benazepril 5-20 mg.

## 2020-08-14 NOTE — Assessment & Plan Note (Signed)
Acute for the last month, no injury. Checking plain films to evaluate for spurs.  Could be arthritis vs plantar fasciitis.  She will purchase insoles for shoes.  Discussed other stretching exercises.

## 2020-08-14 NOTE — Assessment & Plan Note (Signed)
Doing well on fluoxetine 10 mg daily.  Continue same.

## 2020-08-14 NOTE — Patient Instructions (Signed)
Complete xray(s) prior to leaving today. I will notify you of your results once received.  Try holding your atorvastatin 40 mg for 2 weeks to see if this helps to improve your joint aches. Please call me in 2 weeks with an update.  Please schedule a physical to meet with me in late July/early August.  It was a pleasure to see you today!

## 2020-08-15 ENCOUNTER — Ambulatory Visit
Admission: RE | Admit: 2020-08-15 | Discharge: 2020-08-15 | Disposition: A | Discharge status: Home / Self Care | Payer: Medicare Other | Source: Ambulatory Visit | Attending: Primary Care | Admitting: Primary Care

## 2020-08-15 DIAGNOSIS — Z1231 Encounter for screening mammogram for malignant neoplasm of breast: Secondary | ICD-10-CM

## 2020-08-18 ENCOUNTER — Other Ambulatory Visit: Payer: Medicare Other

## 2020-08-28 ENCOUNTER — Ambulatory Visit (HOSPITAL_COMMUNITY): Payer: Medicare Other | Attending: Cardiovascular Disease

## 2020-08-28 ENCOUNTER — Other Ambulatory Visit: Payer: Self-pay

## 2020-08-28 DIAGNOSIS — I712 Thoracic aortic aneurysm, without rupture, unspecified: Secondary | ICD-10-CM

## 2020-08-28 LAB — ECHOCARDIOGRAM COMPLETE
Area-P 1/2: 2.56 cm2
S' Lateral: 2.7 cm

## 2020-08-31 ENCOUNTER — Telehealth: Payer: Self-pay

## 2020-08-31 NOTE — Telephone Encounter (Signed)
Patient stopped taking Atorvastatin on 08/14/2020 to see if she had improvement on leg pain. She did have some improvement but not completely gone.she restarted back on 08/28/2020.

## 2020-08-31 NOTE — Telephone Encounter (Signed)
Noted and agree to resuming atorvastatin.

## 2020-09-01 ENCOUNTER — Other Ambulatory Visit: Payer: Self-pay

## 2020-09-01 ENCOUNTER — Encounter: Payer: Self-pay | Admitting: Internal Medicine

## 2020-09-01 ENCOUNTER — Ambulatory Visit (INDEPENDENT_AMBULATORY_CARE_PROVIDER_SITE_OTHER): Payer: Medicare Other | Admitting: Internal Medicine

## 2020-09-01 VITALS — BP 98/69 | HR 63 | Ht 66.0 in | Wt 210.2 lb

## 2020-09-01 DIAGNOSIS — I712 Thoracic aortic aneurysm, without rupture, unspecified: Secondary | ICD-10-CM

## 2020-09-01 DIAGNOSIS — I10 Essential (primary) hypertension: Secondary | ICD-10-CM | POA: Diagnosis not present

## 2020-09-01 DIAGNOSIS — I700 Atherosclerosis of aorta: Secondary | ICD-10-CM | POA: Diagnosis not present

## 2020-09-01 DIAGNOSIS — E785 Hyperlipidemia, unspecified: Secondary | ICD-10-CM

## 2020-09-01 DIAGNOSIS — Z79899 Other long term (current) drug therapy: Secondary | ICD-10-CM

## 2020-09-01 MED ORDER — ROSUVASTATIN CALCIUM 10 MG PO TABS
10.0000 mg | ORAL_TABLET | Freq: Every day | ORAL | 3 refills | Status: DC
Start: 2020-09-01 — End: 2021-07-25

## 2020-09-01 NOTE — Patient Instructions (Signed)
Medication Instructions:  STOP ATORVASTATIN START CRESTOR (ROSUVASTATIN) 10mg - MAY START THIS AFTER BEING OFF OF ATORVASTATIN FOR 1 WEEK *If you need a refill on your cardiac medications before your next appointment, please call your pharmacy*  Follow-Up: At [facility], you and your health needs are our priority.  As part of our continuing mission to provide you with exceptional heart care, we have created designated Provider Care Teams.  These Care Teams include your primary Cardiologist (physician) and Advanced Practice Providers (APPs -  Physician Assistants and Nurse Practitioners) who all work together to provide you with the care you need, when you need it.  Your next appointment:   6 month(s)  The format for your next appointment:   In Person  Provider:   Cherlynn Kaiser, MD

## 2020-09-01 NOTE — Progress Notes (Signed)
Cardiology Office Note:    Date:  09/01/2020   ID:  HARUMI YAMIN, DOB 1946/03/03, MRN [id number]  PCP:  Pleas [name], NP  Cardiologist:  Elouise Munroe, MD  Electrophysiologist:  None   Referring MD: Pleas [name], NP   Chief Complaint/Reason for Referral: TAA  History of Present Illness:    Marie Ruiz is a 75 y.o. female with a history of DVT with completed anticoagulation, hypertension, hyperlipidemia who presents for follow-up thoracic aortic aneurysm.   She had myalgias with atorvastatin 40 mg daily. Stopped and myalgias gone. Resumed and now in a different location but posterior right thigh pain present. We discussed switching to Crestor 10 mg daily (slight dose reduction) and monitoring symptoms.   Reviewed echocardiogram which shows stable ascending aorta dilation, borderline measurement of 40 mm.   The patient denies chest pain, chest pressure, dyspnea at rest or with exertion, palpitations, PND, orthopnea, or leg swelling. Denies cough, fever, chills. Denies nausea, vomiting. Denies syncope or presyncope. Denies dizziness or lightheadedness.   Past Medical History:  Diagnosis Date  . Allergy    seasonal  . Anxiety   . Aortic aneurysm ([place]) 08/2018  . Arthritis   . Blood clotting disorder ([place])   . Bone spur    with excision  . Chickenpox   . Colon polyps   . DVT (deep venous thrombosis) ([place])   . Glaucoma    both  . Graves disease 03/30/2013  . History of blood clots 10/2017  . Hyperlipemia   . Hypertension   . Osteopenia   . Prediabetes   . Restless leg syndrome     Past Surgical History:  Procedure Laterality Date  . BREAST CYST ASPIRATION    . CATARACT EXTRACTION W/ INTRAOCULAR LENS IMPLANT Right 2015  . CATARACT EXTRACTION W/PHACO Left 2017  . COLONOSCOPY  2014  . DILATION AND CURETTAGE OF UTERUS      Current Medications: Current Meds  Medication Sig  . acetaminophen (TYLENOL) 500 MG tablet Take 500 mg by mouth as  needed for mild pain.  Marland Kitchen amLODipine-benazepril (LOTREL) 5-20 MG capsule Take 1 capsule by mouth daily. For blood pressure.  Marland Kitchen atorvastatin (LIPITOR) 40 MG tablet Take 1 tablet (40 mg total) by mouth daily. For cholesterol.  Marland Kitchen b complex vitamins capsule Take 1 capsule by mouth daily.  . brimonidine (ALPHAGAN) 0.2 % ophthalmic solution 1 drop 2 (two) times daily.  . Calcium Carbonate-Vit D-Min (CALCIUM 1200 PO) Take by mouth.  . cetirizine (ZYRTEC) 10 MG tablet Take 10 mg by mouth daily.  . cholecalciferol (VITAMIN D) 1000 units tablet Take 1,000 Units by mouth daily.  . Coenzyme Q10 (COQ10 PO) Take 300 mg by mouth daily.  . dorzolamide-timolol (COSOPT) 22.3-6.8 MG/ML ophthalmic solution Place 1 drop into both eyes 2 (two) times daily.  Marland Kitchen FLUoxetine (PROZAC) 10 MG capsule Take 1 capsule by mouth once daily for anxiety.  Marland Kitchen latanoprost (XALATAN) 0.005 % ophthalmic solution Place 1 drop into both eyes at bedtime.  Marland Kitchen MAGNESIUM PO Take by mouth.  . Multiple Vitamin (MULTIVITAMIN) tablet Take 1 tablet by mouth daily.  . Olopatadine HCl (PATADAY OP) Apply to eye.  Marland Kitchen Propylene Glycol (SYSTANE BALANCE OP) Apply to eye.  . traZODone (DESYREL) 50 MG tablet Take 1 tablet (50 mg total) by mouth at bedtime as needed for sleep.  Marland Kitchen trolamine salicylate (ASPERCREME) 10 % cream Apply 1 application topically as needed for muscle pain.     Allergies:  Amitriptyline, Codeine, and Prednisone   Social History   Tobacco Use  . Smoking status: Former Smoker    Quit date: 1990    Years since quitting: 32.1  . Smokeless tobacco: Never Used  Vaping Use  . Vaping Use: Never used  Substance Use Topics  . Alcohol use: Yes    Alcohol/week: 2.0 standard drinks    Types: 2 Standard drinks or equivalent per week  . Drug use: Not Currently     Family History: The patient's family history includes Aneurysm in her brother; Breast cancer in her maternal grandmother; Breast cancer (age of onset: 2) in her sister;  Colon polyps in her sister; Esophageal cancer (age of onset: 9) in her maternal aunt; Heart attack in her father; Heart disease in her mother; Multiple myeloma in her sister. There is no history of Colon cancer, Rectal cancer, or Stomach cancer.  ROS:   Please see the history of present illness.    All other systems reviewed and are negative.  EKGs/Labs/Other Studies Reviewed:    The following studies were reviewed today:  EKG:  NSR   Recent Labs: 02/10/2020: ALT 13; BUN 11; Creatinine, Ser 0.72; Hemoglobin 13.3; Platelets 273.0; Potassium 4.3; Sodium 139; TSH 2.42  Recent Lipid Panel    Component Value Date/Time   CHOL 173 02/10/2020 1053   TRIG 185.0 (H) 02/10/2020 1053   HDL 57.10 02/10/2020 1053   CHOLHDL 3 02/10/2020 1053   VLDL 37.0 02/10/2020 1053   LDLCALC 79 02/10/2020 1053    Physical Exam:    VS:  BP 98/69   Pulse 63   Ht 5' 6"  (1.676 m)   Wt 210 lb 3.2 oz (95.3 kg)   SpO2 98%   BMI 33.93 kg/m     Wt Readings from Last 5 Encounters:  09/01/20 210 lb 3.2 oz (95.3 kg)  08/14/20 198 lb (89.8 kg)  02/22/20 205 lb 3.2 oz (93.1 kg)  02/10/20 202 lb 8 oz (91.9 kg)  02/10/20 202 lb 8 oz (91.9 kg)    Constitutional: No acute distress Eyes: sclera non-icteric, normal conjunctiva and lids ENMT: normal dentition, moist mucous membranes Cardiovascular: regular rhythm, normal rate, no murmurs. S1 and S2 normal. Radial pulses normal bilaterally. No jugular venous distention.  Respiratory: clear to auscultation bilaterally GI : normal bowel sounds, soft and nontender. No distention.   MSK: extremities warm, well perfused. No edema.  NEURO: grossly nonfocal exam, moves all extremities. PSYCH: alert and oriented x 3, normal mood and affect.   ASSESSMENT:    1. Thoracic aortic aneurysm without rupture ([place])   2. Essential hypertension   3. Hyperlipidemia, unspecified hyperlipidemia type   4. Aortic atherosclerosis ([facility])    PLAN:    Thoracic aortic aneurysm  without rupture ([place]) Essential hypertension - Plan: EKG 12-Lead  - 40 mm STABLE ascending aorta dilation.  Evaluated on echo most recently 08/28/2020.  Stable since 2020. -Antihypertensive regimen is amlodipine-benazepril 5-20 mg daily.  Blood pressure is low end of normal, may need to drop amlodipine with time, though currently she is asymptomatic and low blood pressure would be optimal for preventing dilation of the aorta. -We reviewed the natural history of ascending aorta dilation as well as aortic dissection and strategies to prevent further dilation of the aorta in detail again today. -She has demonstrated stability and she can be reevaluated with an echocardiogram in 1 year.  If any changes are noted will then pursue CTA.  Hyperlipidemia, unspecified hyperlipidemia type Aortic atherosclerosis ([place]) -  Radiologist reports atherosclerosis of the aorta.  She also carries a diagnosis of hyperlipidemia.  She has previously been on atorvastatin 40 mg daily with good control of LDL and mildly elevated triglycerides.  She had myalgias with atorvastatin 40 mg daily, we will try Crestor instead per her preference.  This is reasonable.  Will reduce the dose to Crestor 10 mg daily and monitor for myalgias.  If she can tolerate it would consider increasing to Crestor 20 mg daily which can be uptitrated by her primary nurse practitioner.  Total time of encounter: 30 minutes total time of encounter, including 25 minutes spent in face-to-face patient care on the date of this encounter. This time includes coordination of care and counseling regarding above mentioned problem list. Remainder of non-face-to-face time involved reviewing chart documents/testing relevant to the patient encounter and documentation in the medical record. I have independently reviewed documentation from referring provider.   Cherlynn Kaiser, MD [place]  [facility]    Medication Adjustments/Labs and Tests Ordered: Current  medicines are reviewed at length with the patient today.  Concerns regarding medicines are outlined above.   No orders of the defined types were placed in this encounter.   No orders of the defined types were placed in this encounter.   There are no Patient Instructions on file for this visit.

## 2020-12-19 ENCOUNTER — Ambulatory Visit
Admission: RE | Admit: 2020-12-19 | Discharge: 2020-12-19 | Disposition: A | Discharge status: Home / Self Care | Payer: Medicare Other | Source: Ambulatory Visit | Attending: Primary Care | Admitting: Primary Care

## 2020-12-19 ENCOUNTER — Other Ambulatory Visit: Payer: Self-pay

## 2020-12-19 DIAGNOSIS — E2839 Other primary ovarian failure: Secondary | ICD-10-CM

## 2021-01-10 ENCOUNTER — Telehealth: Payer: Self-pay | Admitting: *Deleted

## 2021-01-10 ENCOUNTER — Other Ambulatory Visit: Payer: Self-pay

## 2021-01-10 ENCOUNTER — Encounter (HOSPITAL_COMMUNITY): Payer: Self-pay | Admitting: *Deleted

## 2021-01-10 ENCOUNTER — Emergency Department (HOSPITAL_COMMUNITY)
Admission: EM | Admit: 2021-01-10 | Discharge: 2021-01-10 | Disposition: A | Discharge status: Home / Self Care | Payer: Medicare Other | Attending: Emergency Medicine | Admitting: Emergency Medicine

## 2021-01-10 ENCOUNTER — Emergency Department (HOSPITAL_COMMUNITY): Payer: Medicare Other

## 2021-01-10 DIAGNOSIS — Z87891 Personal history of nicotine dependence: Secondary | ICD-10-CM | POA: Diagnosis not present

## 2021-01-10 DIAGNOSIS — I10 Essential (primary) hypertension: Secondary | ICD-10-CM | POA: Insufficient documentation

## 2021-01-10 DIAGNOSIS — R011 Cardiac murmur, unspecified: Secondary | ICD-10-CM | POA: Insufficient documentation

## 2021-01-10 DIAGNOSIS — M79602 Pain in left arm: Secondary | ICD-10-CM | POA: Insufficient documentation

## 2021-01-10 DIAGNOSIS — M542 Cervicalgia: Secondary | ICD-10-CM | POA: Insufficient documentation

## 2021-01-10 DIAGNOSIS — Z79899 Other long term (current) drug therapy: Secondary | ICD-10-CM | POA: Diagnosis not present

## 2021-01-10 DIAGNOSIS — R0789 Other chest pain: Secondary | ICD-10-CM | POA: Diagnosis not present

## 2021-01-10 LAB — CBC
HCT: 42.2 % (ref 36.0–46.0)
Hemoglobin: 13.6 g/dL (ref 12.0–15.0)
MCH: 30.8 pg (ref 26.0–34.0)
MCHC: 32.2 g/dL (ref 30.0–36.0)
MCV: 95.5 fL (ref 80.0–100.0)
Platelets: 299 10*3/uL (ref 150–400)
RBC: 4.42 MIL/uL (ref 3.87–5.11)
RDW: 12.3 % (ref 11.5–15.5)
WBC: 4.9 10*3/uL (ref 4.0–10.5)
nRBC: 0 % (ref 0.0–0.2)

## 2021-01-10 LAB — TROPONIN I (HIGH SENSITIVITY)
Troponin I (High Sensitivity): 2 ng/L (ref <18)
Troponin I (High Sensitivity): 2 ng/L (ref <18)

## 2021-01-10 LAB — BASIC METABOLIC PANEL
Anion gap: 8 (ref 5–15)
BUN: 10 mg/dL (ref 8–23)
CO2: 25 mmol/L (ref 22–32)
Calcium: 10 mg/dL (ref 8.9–10.3)
Chloride: 105 mmol/L (ref 98–111)
Creatinine, Ser: 0.7 mg/dL (ref 0.44–1.00)
GFR, Estimated: >60 mL/min (ref >60)
Glucose, Bld: 98 mg/dL (ref 70–99)
Potassium: 4.1 mmol/L (ref 3.5–5.1)
Sodium: 138 mmol/L (ref 135–145)

## 2021-01-10 MED ORDER — NITROGLYCERIN 0.4 MG SL SUBL: 0.4000 mg | SUBLINGUAL_TABLET | SUBLINGUAL | Status: DC | PRN

## 2021-01-10 NOTE — Discharge Instructions (Addendum)
Please follow closely with your cardiologist regarding your visit today.   Return to the ER for new or worsening symptoms; including worsening chest pain, shortness of breath, pain that radiates to the arm or neck, pain or shortness of breath worsened with exertion.

## 2021-01-10 NOTE — Telephone Encounter (Signed)
Patient called stating that she has an aneurysm.  Patient stated that she stated with chest pain last night more to the left side of her chest.  Patient stated that pain level last night was about an 8. Patient stated today the chest pain is dull and she feels soreness in her chest. Patient stated that she is not suppose to lift anything over 15 pounds. Patient stated that her niece fell Saturday and she tried to pick her up but was not able to pick her up.  Patient stated that she is having pain in her left shoulder today. Patient denies SOB, difficulty breathing or nausea. Patient stated that she feels that she may need an EKG to make sure that nothing is going on with her heart.

## 2021-01-10 NOTE — ED Notes (Signed)
Repeat troponin sent at 1550, has not resulted, called lab to inquire about trop results- lab states "there was an issue with the machine and reading the barcode, lab tech will manually put results in, should result in about 20 minutes. Provider made aware

## 2021-01-10 NOTE — ED Provider Notes (Signed)
[facility] DEPT Provider Note   CSN: [id number] Arrival date & time: 01/10/21  1222     History Chief Complaint  Patient presents with   Chest Pain    Marie Ruiz is a 75 y.o. female w PMHx 4cm ascending aortic aneurysm, GAD, HTN, HLD.  Patient is presenting for evaluation of chest pain that began early this morning around 2 AM.  She states she was lying in bed watching TV when she began having a sharp left-sided chest pain.  The pain started to radiate towards her left arm, back and up into her left neck.  Soon after her pain became more dull in nature and has been constant feels like a "strain" throughout the day.  She treated her symptoms this morning with 2 baby aspirin and some Tylenol with improvement.  She is not having any associated shortness of breath, cough, F/C, body aches, nausea, vomiting, diaphoresis, abdominal pain, or neurologic deficits.  She is concerned regarding her history of ascending thoracic aortic aneurysm.  States she helped her niece get up over the weekend and was concerned she lifted too much weight.  She had no symptoms following that over the last few days until early this morning so she is not sure if this is related. She takes her home meds as directed.  Followed by Dr. Margaretann Loveless with cardiology.   The history is provided by the patient and medical records.      Past Medical History:  Diagnosis Date   Allergy    seasonal   Anxiety    Aortic aneurysm ([place]) 08/2018   Arthritis    Blood clotting disorder (HCC)    Bone spur    with excision   Chickenpox    Colon polyps    DVT (deep venous thrombosis) (HCC)    Glaucoma    both   Graves disease 03/30/2013   History of blood clots 10/2017   Hyperlipemia    Hypertension    Osteopenia    Prediabetes    Restless leg syndrome     Patient Active Problem List   Diagnosis Date Noted   Heel pain, bilateral 08/14/2020   Ascending aortic aneurysm ([place]) 02/10/2020    Insomnia 11/22/2019   Osteopenia 02/01/2019   Sensation of fullness in right ear 12/22/2018   Multiple nevi 12/22/2018   GAD (generalized anxiety disorder) 12/23/2017   Essential hypertension 12/23/2017   DVT (deep venous thrombosis) ([place]) 12/23/2017   Hyperlipidemia 12/23/2017   Graves disease 12/23/2017   Glaucoma 12/23/2017   Osteoarthritis 12/23/2017    Past Surgical History:  Procedure Laterality Date   BREAST CYST ASPIRATION     CATARACT EXTRACTION W/ INTRAOCULAR LENS IMPLANT Right 2015   CATARACT EXTRACTION W/PHACO Left 2017   COLONOSCOPY  2014   DILATION AND CURETTAGE OF UTERUS       OB History   No obstetric history on file.     Family History  Problem Relation Age of Onset   Heart disease Mother    Heart attack Father    Breast cancer Sister 78   Colon polyps Sister    Aneurysm Brother    Multiple myeloma Sister        found in stage 4   Breast cancer Maternal Grandmother    Esophageal cancer Maternal Aunt 82   Colon cancer Neg Hx    Rectal cancer Neg Hx    Stomach cancer Neg Hx     Social History   Tobacco Use  Smoking status: Former    Pack years: 0.00    Types: Cigarettes    Quit date: 1990    Years since quitting: 32.4   Smokeless tobacco: Never  Vaping Use   Vaping Use: Never used  Substance Use Topics   Alcohol use: Yes    Alcohol/week: 2.0 standard drinks    Types: 2 Standard drinks or equivalent per week   Drug use: Not Currently    Home Medications Prior to Admission medications   Medication Sig Start Date End Date Taking? Authorizing Provider  acetaminophen (TYLENOL) 500 MG tablet Take 500 mg by mouth as needed for mild pain.    [provider]  amLODipine-benazepril (LOTREL) 5-20 MG capsule Take 1 capsule by mouth daily. For blood pressure. 02/10/20   Pleas [name], NP  b complex vitamins capsule Take 1 capsule by mouth daily.    [provider]  brimonidine (ALPHAGAN) 0.2 % ophthalmic solution 1 drop 2  (two) times daily. 10/25/19   [provider]  Calcium Carbonate-Vit D-Min (CALCIUM 1200 PO) Take by mouth.    [provider]  cetirizine (ZYRTEC) 10 MG tablet Take 10 mg by mouth daily.    [provider]  cholecalciferol (VITAMIN D) 1000 units tablet Take 1,000 Units by mouth daily.    [provider]  Coenzyme Q10 (COQ10 PO) Take 300 mg by mouth daily.    [provider]  dorzolamide-timolol (COSOPT) 22.3-6.8 MG/ML ophthalmic solution Place 1 drop into both eyes 2 (two) times daily.    [provider]  FLUoxetine (PROZAC) 10 MG capsule Take 1 capsule by mouth once daily for anxiety. 02/10/20   Pleas [name], NP  latanoprost (XALATAN) 0.005 % ophthalmic solution Place 1 drop into both eyes at bedtime.    [provider]  MAGNESIUM PO Take by mouth.    [provider]  Multiple Vitamin (MULTIVITAMIN) tablet Take 1 tablet by mouth daily.    [provider]  Olopatadine HCl (PATADAY OP) Apply to eye.    [provider]  Propylene Glycol (SYSTANE BALANCE OP) Apply to eye.    [provider]  rosuvastatin (CRESTOR) 10 MG tablet Take 1 tablet (10 mg total) by mouth daily. 09/01/20   Elouise Munroe, MD  traZODone (DESYREL) 50 MG tablet Take 1 tablet (50 mg total) by mouth at bedtime as needed for sleep. 11/22/19   Pleas [name], NP  trolamine salicylate (ASPERCREME) 10 % cream Apply 1 application topically as needed for muscle pain.    [provider]    Allergies    Amitriptyline, Codeine, and Prednisone  Review of Systems   Review of Systems  All other systems reviewed and are negative.  Physical Exam Updated Vital Signs BP 140/73   Pulse (!) 59   Temp 98.4 F (36.9 C) (Oral)   Resp 18   SpO2 94%   Physical Exam Vitals and nursing note reviewed.  Constitutional:      Appearance: She is well-developed.  HENT:     Head: Normocephalic and atraumatic.  Eyes:      Conjunctiva/sclera: Conjunctivae normal.  Cardiovascular:     Rate and Rhythm: Normal rate and regular rhythm.     Pulses: Normal pulses.     Heart sounds: Murmur heard.  Pulmonary:     Effort: Pulmonary effort is normal. No respiratory distress.     Breath sounds: Normal breath sounds.  Chest:     Chest wall: No tenderness.  Abdominal:     General: Bowel sounds are normal.     Palpations: Abdomen is soft.     Tenderness: There is no abdominal tenderness.  Musculoskeletal:     Cervical back: Normal range of motion and neck supple.     Right lower leg: No edema.     Left lower leg: No edema.     Comments: No tenderness to left arm or shoulder  Skin:    General: Skin is warm.  Neurological:     Mental Status: She is alert.  Psychiatric:        Behavior: Behavior normal.    ED Results / Procedures / Treatments   Labs (all labs ordered are listed, but only abnormal results are displayed) Labs Reviewed  BASIC METABOLIC PANEL  CBC  TROPONIN I (HIGH SENSITIVITY)  TROPONIN I (HIGH SENSITIVITY)    EKG EKG Interpretation  Date/Time:  Wednesday January 10 2021 14:36:19 EDT Ventricular Rate:  57 PR Interval:  186 QRS Duration: 98 QT Interval:  465 QTC Calculation: 453 R Axis:   6 Text Interpretation: Sinus rhythm Low voltage, precordial leads No acute changes No significant change since last tracing Confirmed by Varney Biles ([id number]) on 01/10/2021 4:27:06 PM  Radiology DG Chest 2 View  Result Date: 01/10/2021 CLINICAL DATA:  Chest pain. EXAM: CHEST - 2 VIEW COMPARISON:  CT chest 04/06/2019. FINDINGS: Mediastinum and hilar structures normal. Heart size normal. Mild left base atelectasis/infiltrate. Small left pleural effusion. No pneumothorax. Degenerative changes scoliosis thoracic spine. IMPRESSION: Mild left base atelectasis/infiltrate and small left pleural effusion. Electronically Signed   By: Marcello Moores  Register   On: 01/10/2021 13:15    Procedures Procedures    Medications Ordered in ED Medications  nitroGLYCERIN (NITROSTAT) SL tablet 0.4 mg (0.4 mg Sublingual Patient Refused/Not Given 01/10/21 1849)    ED Course  I have reviewed the triage vital signs and the nursing notes.  Pertinent labs & imaging results that were available during my care of the patient were reviewed by me and considered in my medical decision making (see chart for details).    MDM Rules/Calculators/A&P                          Patient presenting with atypical chest pain there is been constant since 2 AM this morning.  It began while she was watching TV.  Initially was sharp in nature became more dull.  No shortness of breath, fevers, cough.  O2 saturation is excellent on room air.  She is not tachycardic or tachypneic.  Blood pressure is reassuring.  She was mostly concerned because she knows she has an ascending thoracic aortic aneurysm and wanted to be sure this was stable.  Per chart review however aneurysm measures only 4 cm diameter and has been stable over the last multiple years.  Her presentation is not as consistent for dissection, and additionally she has a heart score of 4 with negative troponins and overall negative work-up.  EKG is nonischemic.  Blood counts and metabolic panel are within normal limits.  Discussed with and evaluated by attending physician Dr. Kathrynn Humble.  Agreed with care plan for discharge.  Recommend she follow with cardiology and return if symptoms worsen.  Discussed results, findings, treatment and follow up. Patient advised of return precautions. Patient verbalized understanding and agreed with plan.   Final Clinical Impression(s) / ED Diagnoses Final diagnoses:  Atypical chest pain    Rx / DC Orders ED Discharge Orders  None        Robinson, [place] N, PA-C 01/10/21 2333    Varney Biles, MD 01/11/21 1622

## 2021-01-10 NOTE — Telephone Encounter (Signed)
After speaking with [name] NP patient was advised that she definitely needs a face to face evaluation. Patient was advised that she can go to an UC or the ER. Patient stated that she will go ahead and go to the ER. Patient stated that she lives in [place] and will have her husband take her to [facility] ER now. Patient was advised if her symptoms get worse before she can get to the ER to call 911 and she verbalized understanding.

## 2021-01-10 NOTE — ED Provider Notes (Signed)
Emergency Medicine Provider Triage Evaluation Note  Marie Ruiz 75 y.o. female was evaluated in triage.  Pt complains of chest pain that began last night about 2:30 AM.  She reports it is a pressure.  States that about 6/10.  She states that she feels like it goes to her back as well as her shoulder.  No associated nausea, vomiting, diaphoresis.  No numbness/weakness of arms or legs.  He has a history of a 4.1 cm thoracic aneurysm.   Review of Systems  Positive: Chest pain Negative: Nausea/vomiting, numbness/weakness.  Physical Exam  BP 134/82   Pulse 70   Temp 98.2 F (36.8 C) (Oral)   Resp 18   Ht 5\' 4"  (1.626 m)   Wt 65.8 kg   SpO2 100%   BMI 24.89 kg/m  Gen:   Awake, no distress   HEENT:  Atraumatic  Resp:  Normal effort  Cardiac:  Normal rate.  2+ radial pulses bilaterally. Abd:   Nondistended, nontender  MSK:   Moves extremities without difficulty  Neuro:  Speech clear   Other:      Medical Decision Making  Medically screening exam initiated at 12:42 PM  Appropriate orders placed.  Marie Ruiz was informed that the remainder of the evaluation will be completed by another provider, this initial triage assessment does not replace that evaluation. They are counseled that they will need to remain in the ED until the completion of their workup, including full H&P and results of any tests.  Risks of leaving the emergency department prior to completion of treatment were discussed. Patient was advised to inform ED staff if they are leaving before their treatment is complete. The patient acknowledged these risks and time was allowed for questions.     The patient appears stable so that the remainder of the MSE may be completed by another provider.    Clinical Impression  Chest pain   Portions of this note were generated with Dragon dictation software. Dictation errors may occur despite best attempts at proofreading.     Volanda Napoleon, PA-C 01/10/21  1243    Valarie Merino, MD 01/10/21 [phone]

## 2021-01-10 NOTE — ED Triage Notes (Signed)
Pt complains of left sided chest pain since 2AM this morning. Hx of aortic aneurism, sees cardiologist twice a year for it. She lifted something heavy 4 days ago. No SOB.

## 2021-01-26 ENCOUNTER — Other Ambulatory Visit: Payer: Self-pay | Admitting: Primary Care

## 2021-01-26 DIAGNOSIS — I10 Essential (primary) hypertension: Secondary | ICD-10-CM

## 2021-01-26 DIAGNOSIS — F411 Generalized anxiety disorder: Secondary | ICD-10-CM

## 2021-02-12 ENCOUNTER — Telehealth: Payer: Self-pay | Admitting: *Deleted

## 2021-02-12 DIAGNOSIS — M79671 Pain in right foot: Secondary | ICD-10-CM

## 2021-02-12 NOTE — Telephone Encounter (Signed)
Patient called stating that she is having heel pain in both feet. Patient stated that [name] NP is aware of this because she did x-rays on her feet last time she was in. Patient stated that she would like a referral to a podiatrist. Patient stated that she prefers to see someone in [place] and wants to make sure that they are in her network.

## 2021-02-13 NOTE — Telephone Encounter (Signed)
Called patient she as received call already this morning has appointment for August.

## 2021-02-13 NOTE — Telephone Encounter (Signed)
Notify patient that referral was placed. Give podiatry 1-2 weeks to contact her.

## 2021-02-14 ENCOUNTER — Encounter: Payer: Self-pay | Admitting: Primary Care

## 2021-02-14 ENCOUNTER — Other Ambulatory Visit: Payer: Self-pay

## 2021-02-14 ENCOUNTER — Ambulatory Visit (INDEPENDENT_AMBULATORY_CARE_PROVIDER_SITE_OTHER): Payer: Medicare Other | Admitting: Primary Care

## 2021-02-14 VITALS — BP 132/68 | HR 60 | Temp 97.8°F | Ht 66.0 in | Wt 202.0 lb

## 2021-02-14 DIAGNOSIS — I10 Essential (primary) hypertension: Secondary | ICD-10-CM

## 2021-02-14 DIAGNOSIS — M79671 Pain in right foot: Secondary | ICD-10-CM

## 2021-02-14 DIAGNOSIS — F411 Generalized anxiety disorder: Secondary | ICD-10-CM

## 2021-02-14 DIAGNOSIS — G4700 Insomnia, unspecified: Secondary | ICD-10-CM

## 2021-02-14 DIAGNOSIS — Z0000 Encounter for general adult medical examination without abnormal findings: Secondary | ICD-10-CM | POA: Insufficient documentation

## 2021-02-14 DIAGNOSIS — E0500 Thyrotoxicosis with diffuse goiter without thyrotoxic crisis or storm: Secondary | ICD-10-CM

## 2021-02-14 DIAGNOSIS — M79672 Pain in left foot: Secondary | ICD-10-CM

## 2021-02-14 DIAGNOSIS — E785 Hyperlipidemia, unspecified: Secondary | ICD-10-CM

## 2021-02-14 DIAGNOSIS — H409 Unspecified glaucoma: Secondary | ICD-10-CM

## 2021-02-14 DIAGNOSIS — M8580 Other specified disorders of bone density and structure, unspecified site: Secondary | ICD-10-CM | POA: Diagnosis not present

## 2021-02-14 DIAGNOSIS — Z0001 Encounter for general adult medical examination with abnormal findings: Secondary | ICD-10-CM | POA: Insufficient documentation

## 2021-02-14 NOTE — Assessment & Plan Note (Signed)
Asymptomatic. Repeat TSH pending.

## 2021-02-14 NOTE — Assessment & Plan Note (Signed)
Doing well on PRN trazodone 50 mg for which she needs every 1-2 weeks. Continue same.

## 2021-02-14 NOTE — Assessment & Plan Note (Addendum)
Follows with ophthalmology regularly.  Continue Xalatan, Cosopt, and Alphagan drops.

## 2021-02-14 NOTE — Assessment & Plan Note (Signed)
Doing well on fluoxetine 10 mg, continue same.

## 2021-02-14 NOTE — Assessment & Plan Note (Signed)
Chronic and continued, will be seeing podiatry next week for evaluation.

## 2021-02-14 NOTE — Assessment & Plan Note (Signed)
Compliant to Crestor 10 mg, repeat lipid panel pending.

## 2021-02-14 NOTE — Assessment & Plan Note (Signed)
Immunizations UTD. Mammogram and bone density scans UTD. Colonoscopy UTD.  Discussed the importance of a healthy diet and regular exercise in order for weight loss, and to reduce the risk of further co-morbidity.  Exam today stable. Labs pending.

## 2021-02-14 NOTE — Assessment & Plan Note (Signed)
Well controlled in the office today, continue amlodipine-benazepril 5-20 mg daily. CMP pending.

## 2021-02-14 NOTE — Progress Notes (Signed)
Subjective:    Patient ID: Marie Ruiz, female    DOB: [birth year], 75 y.o.   MRN: [id number]  HPI  Marie Ruiz is a very pleasant 75 y.o. female who presents today for complete physical and follow up of chronic conditions.  Immunizations: -Tetanus: 2017 -Influenza: due this season  -Covid-19: 2 vaccines  -Shingles: Zostavax in 2014, Shingrix in January 2022 and Marie 2022 -Pneumonia: Prevnar 13 in 2017, Pneumovax in 2015   Diet: [place].  Exercise: No regular exercise.  Eye exam: Completes annually  Dental exam: Completes semi-annually   Mammogram: Completed in 2022 Dexa: Completed in 2022, osteopenia  Colonoscopy: Completed in 2021, due 2031 if appropriate   BP Readings from Last 3 Encounters:  02/14/21 132/68  01/10/21 140/73  09/01/20 98/69      Review of Systems  Constitutional:  Negative for unexpected weight change.  HENT:  Negative for rhinorrhea.   Respiratory:  Negative for shortness of breath.   Cardiovascular:  Negative for chest pain.  Gastrointestinal:  Negative for constipation and diarrhea.  Genitourinary:  Negative for difficulty urinating.  Musculoskeletal:  Negative for arthralgias and myalgias.  Skin:  Negative for rash.  Allergic/Immunologic: Negative for environmental allergies.  Neurological:  Negative for dizziness and headaches.  Psychiatric/Behavioral:  The patient is not nervous/anxious.         Past Medical History:  Diagnosis Date   Allergy    seasonal   Anxiety    Aortic aneurysm ([place]) 08/2018   Arthritis    Blood clotting disorder (HCC)    Bone spur    with excision   Chickenpox    Colon polyps    DVT (deep venous thrombosis) (H[facility])    Glaucoma    both   Graves disease 03/30/2013   History of blood clots 10/2017   Hyperlipemia    Hypertension    Osteopenia    Prediabetes    Restless leg syndrome     Social History   Socioeconomic History   Marital status: Married    Spouse name: Not on file    Number of children: Not on file   Years of education: Not on file   Highest education level: Not on file  Occupational History   Occupation: Retired  Tobacco Use   Smoking status: Former    Types: Cigarettes    Quit date: 1990    Years since quitting: 32.5   Smokeless tobacco: Never  Vaping Use   Vaping Use: Never used  Substance and Sexual Activity   Alcohol use: Yes    Alcohol/week: 2.0 standard drinks    Types: 2 Standard drinks or equivalent per week   Drug use: Not Currently   Sexual activity: Not Currently  Other Topics Concern   Not on file  Social History Narrative   Lives in [place], moving to [place].   Retired.   Married.    Social Determinants of Health   Financial Resource Strain: Not on file  Food Insecurity: Not on file  Transportation Needs: Not on file  Physical Activity: Not on file  Stress: Not on file  Social Connections: Not on file  Intimate Partner Violence: Not on file    Past Surgical History:  Procedure Laterality Date   BREAST CYST ASPIRATION     CATARACT EXTRACTION W/ INTRAOCULAR LENS IMPLANT Right 2015   CATARACT EXTRACTION W/PHACO Left 2017   COLONOSCOPY  2014   DILATION AND CURETTAGE OF UTERUS      Family History  Problem Relation Age of Onset   Heart disease Mother    Heart attack Father    Breast cancer Sister 45   Colon polyps Sister    Aneurysm Brother    Multiple myeloma Sister        found in stage 4   Breast cancer Maternal Grandmother    Esophageal cancer Maternal Aunt 82   Colon cancer Neg Hx    Rectal cancer Neg Hx    Stomach cancer Neg Hx     Allergies  Allergen Reactions   Amitriptyline Other (See Comments)    hallucinations   Codeine    Prednisone     Depression    Current Outpatient Medications on File Prior to Visit  Medication Sig Dispense Refill   acetaminophen (TYLENOL) 500 MG tablet Take 500 mg by mouth as needed for mild pain.     amLODipine-benazepril (LOTREL) 5-20 MG capsule TAKE 1 CAPSULE BY  MOUTH EVERY DAY FOR BLOOD PRESSURE 90 capsule 1   b complex vitamins capsule Take 1 capsule by mouth daily.     brimonidine (ALPHAGAN) 0.2 % ophthalmic solution 1 drop 2 (two) times daily.     Calcium Carbonate-Vit D-Min (CALCIUM 1200 PO) Take by mouth.     cetirizine (ZYRTEC) 10 MG tablet Take 10 mg by mouth daily.     cholecalciferol (VITAMIN D) 1000 units tablet Take 1,000 Units by mouth daily.     Coenzyme Q10 (COQ10 PO) Take 300 mg by mouth daily.     dorzolamide-timolol (COSOPT) 22.3-6.8 MG/ML ophthalmic solution Place 1 drop into both eyes 2 (two) times daily.     FLUoxetine (PROZAC) 10 MG capsule TAKE 1 CAPSULE BY MOUTH EVERY DAY FOR ANXIETY 90 capsule 1   latanoprost (XALATAN) 0.005 % ophthalmic solution Place 1 drop into both eyes at bedtime.     MAGNESIUM PO Take by mouth.     Multiple Vitamin (MULTIVITAMIN) tablet Take 1 tablet by mouth daily.     Olopatadine HCl (PATADAY OP) Apply to eye.     Propylene Glycol (SYSTANE BALANCE OP) Apply to eye.     rosuvastatin (CRESTOR) 10 MG tablet Take 1 tablet (10 mg total) by mouth daily. 90 tablet 3   traZODone (DESYREL) 50 MG tablet Take 1 tablet (50 mg total) by mouth at bedtime as needed for sleep. 90 tablet 1   trolamine salicylate (ASPERCREME) 10 % cream Apply 1 application topically as needed for muscle pain.     No current facility-administered medications on file prior to visit.    BP 132/68   Pulse 60   Temp 97.8 F (36.6 C) (Temporal)   Ht 5' 6"  (1.676 m)   Wt 202 lb (91.6 kg)   SpO2 96%   BMI 32.60 kg/m  Objective:   Physical Exam HENT:     Right Ear: Tympanic membrane and ear canal normal.     Left Ear: Tympanic membrane and ear canal normal.     Nose: Nose normal.  Eyes:     Conjunctiva/sclera: Conjunctivae normal.     Pupils: Pupils are equal, round, and reactive to light.  Neck:     Thyroid: No thyromegaly.  Cardiovascular:     Rate and Rhythm: Normal rate and regular rhythm.     Heart sounds: No murmur  heard. Pulmonary:     Effort: Pulmonary effort is normal.     Breath sounds: Normal breath sounds. No rales.  Abdominal:     General: Bowel sounds are normal.  Palpations: Abdomen is soft.     Tenderness: There is no abdominal tenderness.  Musculoskeletal:        General: Normal range of motion.     Cervical back: Neck supple.  Lymphadenopathy:     Cervical: No cervical adenopathy.  Skin:    General: Skin is warm and dry.     Findings: No rash.  Neurological:     Mental Status: She is alert and oriented to person, place, and time.     Cranial Nerves: No cranial nerve deficit.     Deep Tendon Reflexes: Reflexes are normal and symmetric.  Psychiatric:        Mood and Affect: Mood normal.          Assessment & Plan:      This visit occurred during the SARS-CoV-2 public health emergency.  Safety protocols were in place, including screening questions prior to the visit, additional usage of staff PPE, and extensive cleaning of exam room while observing appropriate contact time as indicated for disinfecting solutions.

## 2021-02-14 NOTE — Assessment & Plan Note (Signed)
Compliant to calcium and vitamin D.  Encouraged weight bearing exercise.  Recent bone density reviewed and is updated.

## 2021-02-14 NOTE — Patient Instructions (Signed)
Stop by the lab prior to leaving today. I will notify you of your results once received.   It was a pleasure to see you today!  Preventive Care 65 Years and Older, Female Preventive care refers to lifestyle choices and visits with your health care provider that can promote health and wellness. This includes: A yearly physical exam. This is also called an annual wellness visit. Regular dental and eye exams. Immunizations. Screening for certain conditions. Healthy lifestyle choices, such as: Eating a healthy diet. Getting regular exercise. Not using drugs or products that contain nicotine and tobacco. Limiting alcohol use. What can I expect for my preventive care visit? Physical exam Your health care provider will check your: Height and weight. These may be used to calculate your BMI (body mass index). BMI is a measurement that tells if you are at a healthy weight. Heart rate and blood pressure. Body temperature. Skin for abnormal spots. Counseling Your health care provider may ask you questions about your: Past medical problems. Family's medical history. Alcohol, tobacco, and drug use. Emotional well-being. Home life and relationship well-being. Sexual activity. Diet, exercise, and sleep habits. History of falls. Memory and ability to understand (cognition). Work and work environment. Pregnancy and menstrual history. Access to firearms. What immunizations do I need?  Vaccines are usually given at various ages, according to a schedule. Your health care provider will recommend vaccines for you based on your age, medicalhistory, and lifestyle or other factors, such as travel or where you work. What tests do I need? Blood tests Lipid and cholesterol levels. These may be checked every 5 years, or more often depending on your overall health. Hepatitis C test. Hepatitis B test. Screening Lung cancer screening. You may have this screening every year starting at age 55 if you have  a 30-pack-year history of smoking and currently smoke or have quit within the past 15 years. Colorectal cancer screening. All adults should have this screening starting at age 50 and continuing until age 75. Your health care provider may recommend screening at age 45 if you are at increased risk. You will have tests every 1-10 years, depending on your results and the type of screening test. Diabetes screening. This is done by checking your blood sugar (glucose) after you have not eaten for a while (fasting). You may have this done every 1-3 years. Mammogram. This may be done every 1-2 years. Talk with your health care provider about how often you should have regular mammograms. Abdominal aortic aneurysm (AAA) screening. You may need this if you are a current or former smoker. BRCA-related cancer screening. This may be done if you have a family history of breast, ovarian, tubal, or peritoneal cancers. Other tests STD (sexually transmitted disease) testing, if you are at risk. Bone density scan. This is done to screen for osteoporosis. You may have this done starting at age 65. Talk with your health care provider about your test results, treatment options,and if necessary, the need for more tests. Follow these instructions at home: Eating and drinking  Eat a diet that includes fresh fruits and vegetables, whole grains, lean protein, and low-fat dairy products. Limit your intake of foods with high amounts of sugar, saturated fats, and salt. Take vitamin and mineral supplements as recommended by your health care provider. Do not drink alcohol if your health care provider tells you not to drink. If you drink alcohol: Limit how much you have to 0-1 drink a day. Be aware of how much alcohol is   in your drink. In the [place] one drink equals one 12 oz bottle of beer (355 mL), one 5 oz glass of wine (148 mL), or one 1 oz glass of hard liquor (44 mL).  Lifestyle Take daily care of your teeth and  gums. Brush your teeth every morning and night with fluoride toothpaste. Floss one time each day. Stay active. Exercise for at least 30 minutes 5 or more days each week. Do not use any products that contain nicotine or tobacco, such as cigarettes, e-cigarettes, and chewing tobacco. If you need help quitting, ask your health care provider. Do not use drugs. If you are sexually active, practice safe sex. Use a condom or other form of protection in order to prevent STIs (sexually transmitted infections). Talk with your health care provider about taking a low-dose aspirin or statin. Find healthy ways to cope with stress, such as: Meditation, yoga, or listening to music. Journaling. Talking to a trusted person. Spending time with friends and family. Safety Always wear your seat belt while driving or riding in a vehicle. Do not drive: If you have been drinking alcohol. Do not ride with someone who has been drinking. When you are tired or distracted. While texting. Wear a helmet and other protective equipment during sports activities. If you have firearms in your house, make sure you follow all gun safety procedures. What's next? Visit your health care provider once a year for an annual wellness visit. Ask your health care provider how often you should have your eyes and teeth checked. Stay up to date on all vaccines. This information is not intended to replace advice given to you by your health care provider. Make sure you discuss any questions you have with your healthcare provider. Document Revised: 06/28/2020 Document Reviewed: 07/02/2018 Elsevier Patient Education  2022 [employer]  

## 2021-02-15 LAB — COMPREHENSIVE METABOLIC PANEL
ALT: 19 U/L (ref 0–35)
AST: 23 U/L (ref 0–37)
Albumin: 4.7 g/dL (ref 3.5–5.2)
Alkaline Phosphatase: 54 U/L (ref 39–117)
BUN: 10 mg/dL (ref 6–23)
CO2: 28 mEq/L (ref 19–32)
Calcium: 10.1 mg/dL (ref 8.4–10.5)
Chloride: 102 mEq/L (ref 96–112)
Creatinine, Ser: 0.74 mg/dL (ref 0.40–1.20)
GFR: 79.36 mL/min (ref >60.00)
Glucose, Bld: 89 mg/dL (ref 70–99)
Potassium: 4.5 mEq/L (ref 3.5–5.1)
Sodium: 138 mEq/L (ref 135–145)
Total Bilirubin: 0.6 mg/dL (ref 0.2–1.2)
Total Protein: 7.6 g/dL (ref 6.0–8.3)

## 2021-02-15 LAB — LIPID PANEL
Cholesterol: 189 mg/dL (ref 0–200)
HDL: 66.6 mg/dL (ref >39.00)
LDL Cholesterol: 84 mg/dL (ref 0–99)
NonHDL: 122.41
Total CHOL/HDL Ratio: 3
Triglycerides: 192 mg/dL — ABNORMAL HIGH (ref 0.0–149.0)
VLDL: 38.4 mg/dL (ref 0.0–40.0)

## 2021-02-15 LAB — TSH: TSH: 1.7 u[IU]/mL (ref 0.35–5.50)

## 2021-02-20 ENCOUNTER — Encounter: Payer: Self-pay | Admitting: Podiatry

## 2021-02-20 ENCOUNTER — Other Ambulatory Visit: Payer: Self-pay

## 2021-02-20 ENCOUNTER — Ambulatory Visit (INDEPENDENT_AMBULATORY_CARE_PROVIDER_SITE_OTHER): Payer: Medicare Other | Admitting: Podiatry

## 2021-02-20 DIAGNOSIS — M722 Plantar fascial fibromatosis: Secondary | ICD-10-CM | POA: Diagnosis not present

## 2021-02-20 DIAGNOSIS — M79671 Pain in right foot: Secondary | ICD-10-CM | POA: Diagnosis not present

## 2021-02-20 DIAGNOSIS — M79672 Pain in left foot: Secondary | ICD-10-CM | POA: Diagnosis not present

## 2021-02-20 MED ORDER — TRIAMCINOLONE ACETONIDE 10 MG/ML IJ SUSP
10.0000 mg | Freq: Once | INTRAMUSCULAR | Status: AC
  Administered 2021-02-20: 10 mg

## 2021-02-20 NOTE — Patient Instructions (Signed)
For instructions on how to put on your Plantar Fascial Brace, please visit [email or web address]   Plantar Fasciitis (Heel Spur Syndrome) with Rehab The plantar fascia is a fibrous, ligament-like, soft-tissue structure that spans the bottom of the foot. Plantar fasciitis is a condition that causes pain in the foot due to inflammation of the tissue. SYMPTOMS   Pain and tenderness on the underneath side of the foot.  Pain that worsens with standing or walking. CAUSES  Plantar fasciitis is caused by irritation and injury to the plantar fascia on the underneath side of the foot. Common mechanisms of injury include:  Direct trauma to bottom of the foot.  Damage to a small nerve that runs under the foot where the main fascia attaches to the heel bone.  Stress placed on the plantar fascia due to bone spurs. RISK INCREASES WITH:   Activities that place stress on the plantar fascia (running, jumping, pivoting, or cutting).  Poor strength and flexibility.  Improperly fitted shoes.  Tight calf muscles.  Flat feet.  Failure to warm-up properly before activity.  Obesity. PREVENTION  Warm up and stretch properly before activity.  Allow for adequate recovery between workouts.  Maintain physical fitness:  Strength, flexibility, and endurance.  Cardiovascular fitness.  Maintain a health body weight.  Avoid stress on the plantar fascia.  Wear properly fitted shoes, including arch supports for individuals who have flat feet.  PROGNOSIS  If treated properly, then the symptoms of plantar fasciitis usually resolve without surgery. However, occasionally surgery is necessary.  RELATED COMPLICATIONS   Recurrent symptoms that may result in a chronic condition.  Problems of the lower back that are caused by compensating for the injury, such as limping.  Pain or weakness of the foot during push-off following surgery.  Chronic inflammation, scarring, and partial or complete  fascia tear, occurring more often from repeated injections.  TREATMENT  Treatment initially involves the use of ice and medication to help reduce pain and inflammation. The use of strengthening and stretching exercises may help reduce pain with activity, especially stretches of the Achilles tendon. These exercises may be performed at home or with a therapist. Your caregiver may recommend that you use heel cups of arch supports to help reduce stress on the plantar fascia. Occasionally, corticosteroid injections are given to reduce inflammation. If symptoms persist for greater than 6 months despite non-surgical (conservative), then surgery may be recommended.   MEDICATION   If pain medication is necessary, then nonsteroidal anti-inflammatory medications, such as aspirin and ibuprofen, or other minor pain relievers, such as acetaminophen, are often recommended.  Do not take pain medication within 7 days before surgery.  Prescription pain relievers may be given if deemed necessary by your caregiver. Use only as directed and only as much as you need.  Corticosteroid injections may be given by your caregiver. These injections should be reserved for the most serious cases, because they may only be given a certain number of times.  HEAT AND COLD  Cold treatment (icing) relieves pain and reduces inflammation. Cold treatment should be applied for 10 to 15 minutes every 2 to 3 hours for inflammation and pain and immediately after any activity that aggravates your symptoms. Use ice packs or massage the area with a piece of ice (ice massage).  Heat treatment may be used prior to performing the stretching and strengthening activities prescribed by your caregiver, physical therapist, or athletic trainer. Use a heat pack or soak the injury in warm water.  SEEK IMMEDIATE MEDICAL   CARE IF:  Treatment seems to offer no benefit, or the condition worsens.  Any medications produce adverse side effects.   EXERCISES- RANGE OF MOTION (ROM) AND STRETCHING EXERCISES - Plantar Fasciitis (Heel Spur Syndrome) These exercises may help you when beginning to rehabilitate your injury. Your symptoms may resolve with or without further involvement from your physician, physical therapist or athletic trainer. While completing these exercises, remember:   Restoring tissue flexibility helps normal motion to return to the joints. This allows healthier, less painful movement and activity.  An effective stretch should be held for at least 30 seconds.  A stretch should never be painful. You should only feel a gentle lengthening or release in the stretched tissue.  RANGE OF MOTION - Toe Extension, Flexion  Sit with your right / left leg crossed over your opposite knee.  Grasp your toes and gently pull them back toward the top of your foot. You should feel a stretch on the bottom of your toes and/or foot.  Hold this stretch for 10 seconds.  Now, gently pull your toes toward the bottom of your foot. You should feel a stretch on the top of your toes and or foot.  Hold this stretch for 10 seconds. Repeat  times. Complete this stretch 3 times per day.   RANGE OF MOTION - Ankle Dorsiflexion, Active Assisted  Remove shoes and sit on a chair that is preferably not on a carpeted surface.  Place right / left foot under knee. Extend your opposite leg for support.  Keeping your heel down, slide your right / left foot back toward the chair until you feel a stretch at your ankle or calf. If you do not feel a stretch, slide your bottom forward to the edge of the chair, while still keeping your heel down.  Hold this stretch for 10 seconds. Repeat 3 times. Complete this stretch 2 times per day.   STRETCH  Gastroc, Standing  Place hands on wall.  Extend right / left leg, keeping the front knee somewhat bent.  Slightly point your toes inward on your back foot.  Keeping your right / left heel on the floor and your  knee straight, shift your weight toward the wall, not allowing your back to arch.  You should feel a gentle stretch in the right / left calf. Hold this position for 10 seconds. Repeat 3 times. Complete this stretch 2 times per day.  STRETCH  Soleus, Standing  Place hands on wall.  Extend right / left leg, keeping the other knee somewhat bent.  Slightly point your toes inward on your back foot.  Keep your right / left heel on the floor, bend your back knee, and slightly shift your weight over the back leg so that you feel a gentle stretch deep in your back calf.  Hold this position for 10 seconds. Repeat 3 times. Complete this stretch 2 times per day.  STRETCH  Gastrocsoleus, Standing  Note: This exercise can place a lot of stress on your foot and ankle. Please complete this exercise only if specifically instructed by your caregiver.   Place the ball of your right / left foot on a step, keeping your other foot firmly on the same step.  Hold on to the wall or a rail for balance.  Slowly lift your other foot, allowing your body weight to press your heel down over the edge of the step.  You should feel a stretch in your right / left calf.  Hold this   position for 10 seconds.  Repeat this exercise with a slight bend in your right / left knee. Repeat 3 times. Complete this stretch 2 times per day.   STRENGTHENING EXERCISES - Plantar Fasciitis (Heel Spur Syndrome)  These exercises may help you when beginning to rehabilitate your injury. They may resolve your symptoms with or without further involvement from your physician, physical therapist or athletic trainer. While completing these exercises, remember:   Muscles can gain both the endurance and the strength needed for everyday activities through controlled exercises.  Complete these exercises as instructed by your physician, physical therapist or athletic trainer. Progress the resistance and repetitions only as guided.  STRENGTH -  Towel Curls  Sit in a chair positioned on a non-carpeted surface.  Place your foot on a towel, keeping your heel on the floor.  Pull the towel toward your heel by only curling your toes. Keep your heel on the floor. Repeat 3 times. Complete this exercise 2 times per day.  STRENGTH - Ankle Inversion  Secure one end of a rubber exercise band/tubing to a fixed object (table, pole). Loop the other end around your foot just before your toes.  Place your fists between your knees. This will focus your strengthening at your ankle.  Slowly, pull your big toe up and in, making sure the band/tubing is positioned to resist the entire motion.  Hold this position for 10 seconds.  Have your muscles resist the band/tubing as it slowly pulls your foot back to the starting position. Repeat 3 times. Complete this exercises 2 times per day.  Document Released: 07/08/2005 Document Revised: 09/30/2011 Document Reviewed: 10/20/2008 [employer] Patient Information 2014 [employer]. 

## 2021-02-26 NOTE — Progress Notes (Signed)
Subjective:   Patient ID: Marie Ruiz, female   DOB: 75 y.o.   MRN: [id number]   HPI 75 year old female presents the office with concerns of heel pain which is been ongoing for about 6 months.  She present x-rays performed on August 14, 2020.  She states that hurts in the morning if she sits and stands back up.  She tried stretching seems to aggravate it.  No recent injury.  Left side worse than right.  No other concerns today.   Review of Systems  All other systems reviewed and are negative.  Past Medical History:  Diagnosis Date   Allergy    seasonal   Anxiety    Aortic aneurysm ([place]) 08/2018   Arthritis    Blood clotting disorder ([facility])    Bone spur    with excision   Chickenpox    Colon polyps    DVT (deep venous thrombosis) ([facility])    Glaucoma    both   Graves disease 03/30/2013   History of blood clots 10/2017   Hyperlipemia    Hypertension    Osteopenia    Prediabetes    Restless leg syndrome     Past Surgical History:  Procedure Laterality Date   BREAST CYST ASPIRATION     CATARACT EXTRACTION W/ INTRAOCULAR LENS IMPLANT Right 2015   CATARACT EXTRACTION W/PHACO Left 2017   COLONOSCOPY  2014   DILATION AND CURETTAGE OF UTERUS       Current Outpatient Medications:    acetaminophen (TYLENOL) 500 MG tablet, Take 500 mg by mouth as needed for mild pain., Disp: , Rfl:    amLODipine-benazepril (LOTREL) 5-20 MG capsule, TAKE 1 CAPSULE BY MOUTH EVERY DAY FOR BLOOD PRESSURE, Disp: 90 capsule, Rfl: 1   b complex vitamins capsule, Take 1 capsule by mouth daily., Disp: , Rfl:    brimonidine (ALPHAGAN) 0.2 % ophthalmic solution, 1 drop 2 (two) times daily., Disp: , Rfl:    Calcium Carbonate-Vit D-Min (CALCIUM 1200 PO), Take by mouth., Disp: , Rfl:    cetirizine (ZYRTEC) 10 MG tablet, Take 10 mg by mouth daily., Disp: , Rfl:    cholecalciferol (VITAMIN D) 1000 units tablet, Take 1,000 Units by mouth daily., Disp: , Rfl:    Coenzyme Q10 (COQ10 PO), Take 300 mg by  mouth daily., Disp: , Rfl:    dorzolamide-timolol (COSOPT) 22.3-6.8 MG/ML ophthalmic solution, Place 1 drop into both eyes 2 (two) times daily., Disp: , Rfl:    FLUoxetine (PROZAC) 10 MG capsule, TAKE 1 CAPSULE BY MOUTH EVERY DAY FOR ANXIETY, Disp: 90 capsule, Rfl: 1   latanoprost (XALATAN) 0.005 % ophthalmic solution, Place 1 drop into both eyes at bedtime., Disp: , Rfl:    MAGNESIUM PO, Take by mouth., Disp: , Rfl:    Multiple Vitamin (MULTIVITAMIN) tablet, Take 1 tablet by mouth daily., Disp: , Rfl:    Olopatadine HCl (PATADAY OP), Apply to eye., Disp: , Rfl:    Propylene Glycol (SYSTANE BALANCE OP), Apply to eye., Disp: , Rfl:    rosuvastatin (CRESTOR) 10 MG tablet, Take 1 tablet (10 mg total) by mouth daily., Disp: 90 tablet, Rfl: 3   traZODone (DESYREL) 50 MG tablet, Take 1 tablet (50 mg total) by mouth at bedtime as needed for sleep., Disp: 90 tablet, Rfl: 1   trolamine salicylate (ASPERCREME) 10 % cream, Apply 1 application topically as needed for muscle pain., Disp: , Rfl:   Allergies  Allergen Reactions   Amitriptyline Other (See Comments)  hallucinations   Codeine    Prednisone     Depression         Objective:  Physical Exam  General: AAO x3, NAD  Dermatological: Skin is warm, dry and supple bilateral.  There are no open sores, no preulcerative lesions, no rash or signs of infection present.  Vascular: Dorsalis Pedis artery and Posterior Tibial artery pedal pulses are 2/4 bilateral with immedate capillary fill time. There is no pain with calf compression, swelling, warmth, erythema.   Neruologic: Grossly intact via light touch bilateral.  No Tinel sign  Musculoskeletal: There is tenderness palpation on plantar medial tubercle of the calcaneus at the insertion of plantar fascia on the left >> right.  There is no pain with lateral compression of calcaneus.  No pain in the Achilles tendon.  No other areas of tenderness identified.  No edema, erythema.  Muscular strength  5/5 in all groups tested bilateral.  Gait: Unassisted, Nonantalgic.       Assessment:   75 year old female with bilateral heel pain left side worse than right, plantar fasciitis     Plan:  -Treatment options discussed including all alternatives, risks, and complications -Etiology of symptoms were discussed -Independently reviewed the x-rays.  Calcaneal spurs present. -Steroid injection for the left foot -Plantar fascial brace dispensed x2 -Night splint -Discussed gradual increase in stretching exercises.  Ice as needed. -Unable to take anti-inflammatories  Procedure: Injection Tendon/Ligament Discussed alternatives, risks, complications and verbal consent was obtained.  Location: Left plantar fascia at the glabrous junction; medial approach. Skin Prep: Alcohol. Injectate: 0.5cc 0.5% marcaine plain, 0.5 cc 2% lidocaine plain and, 1 cc kenalog 10. Disposition: Patient tolerated procedure well. Injection site dressed with a band-aid.  Post-injection care was discussed and return precautions discussed.   No follow-ups on file.  Trula Slade DPM

## 2021-03-27 ENCOUNTER — Ambulatory Visit: Payer: BC Managed Care – PPO | Admitting: Podiatry

## 2021-04-12 ENCOUNTER — Other Ambulatory Visit: Payer: Self-pay

## 2021-04-12 ENCOUNTER — Encounter: Payer: Self-pay | Admitting: Internal Medicine

## 2021-04-12 ENCOUNTER — Ambulatory Visit (INDEPENDENT_AMBULATORY_CARE_PROVIDER_SITE_OTHER): Payer: Medicare Other | Admitting: Internal Medicine

## 2021-04-12 VITALS — BP 124/72 | HR 73 | Ht 65.5 in | Wt 205.0 lb

## 2021-04-12 DIAGNOSIS — E785 Hyperlipidemia, unspecified: Secondary | ICD-10-CM

## 2021-04-12 DIAGNOSIS — I10 Essential (primary) hypertension: Secondary | ICD-10-CM | POA: Diagnosis not present

## 2021-04-12 DIAGNOSIS — I77810 Thoracic aortic ectasia: Secondary | ICD-10-CM | POA: Diagnosis not present

## 2021-04-12 DIAGNOSIS — R072 Precordial pain: Secondary | ICD-10-CM

## 2021-04-12 DIAGNOSIS — I700 Atherosclerosis of aorta: Secondary | ICD-10-CM

## 2021-04-12 NOTE — Progress Notes (Signed)
Cardiology Office Note:    Date:  04/12/2021   ID:  Marie Ruiz, DOB 1945-09-26, MRN [id number]  PCP:  Pleas [name], NP  Cardiologist:  Elouise Munroe, MD  Electrophysiologist:  None   Referring MD: Pleas [name], NP   Chief Complaint/Reason for Referral: Chest pain, follow up TAA  History of Present Illness:    Marie Ruiz is a 75 y.o. female with a history of history of aortic aneurysm, prior positive lupus anticoagulant with normalization off of anticoagulation, DVT felt to be provoked 10/2017, hypertension, hyperlipidemia. Here for follow up.   Noted recently a sense of fatigue, and unexplained diaphoresis. She has a known mild dilation of ascending aorta, 40 mm. No worrisome severe chest pain however she does notice a chest discomfort which can accompany the fatigue and diaphoresis and may be worsened by activity. The patient denies dyspnea at rest or with exertion, palpitations, PND, orthopnea, or leg swelling. Denies cough, fever, chills. Denies nausea, vomiting. Denies syncope or presyncope. Denies dizziness or lightheadedness.    Past Medical History:  Diagnosis Date   Allergy    seasonal   Anxiety    Aortic aneurysm ([place]) 08/2018   Arthritis    Blood clotting disorder (HCC)    Bone spur    with excision   Chickenpox    Colon polyps    DVT (deep venous thrombosis) (HCC)    Glaucoma    both   Graves disease 03/30/2013   History of blood clots 10/2017   Hyperlipemia    Hypertension    Osteopenia    Prediabetes    Restless leg syndrome     Past Surgical History:  Procedure Laterality Date   BREAST CYST ASPIRATION     CATARACT EXTRACTION W/ INTRAOCULAR LENS IMPLANT Right 2015   CATARACT EXTRACTION W/PHACO Left 2017   COLONOSCOPY  2014   DILATION AND CURETTAGE OF UTERUS      Current Medications: Current Meds  Medication Sig   acetaminophen (TYLENOL) 500 MG tablet Take 500 mg by mouth as needed for mild pain.    amLODipine-benazepril (LOTREL) 5-20 MG capsule TAKE 1 CAPSULE BY MOUTH EVERY DAY FOR BLOOD PRESSURE   b complex vitamins capsule Take 1 capsule by mouth daily.   brimonidine (ALPHAGAN) 0.2 % ophthalmic solution 1 drop 2 (two) times daily.   Calcium Carbonate-Vit D-Min (CALCIUM 1200 PO) Take by mouth.   cetirizine (ZYRTEC) 10 MG tablet Take 10 mg by mouth daily.   cholecalciferol (VITAMIN D) 1000 units tablet Take 1,000 Units by mouth daily.   Coenzyme Q10 (COQ10 PO) Take 300 mg by mouth daily.   dorzolamide-timolol (COSOPT) 22.3-6.8 MG/ML ophthalmic solution Place 1 drop into both eyes 2 (two) times daily.   FLUoxetine (PROZAC) 10 MG capsule TAKE 1 CAPSULE BY MOUTH EVERY DAY FOR ANXIETY   latanoprost (XALATAN) 0.005 % ophthalmic solution Place 1 drop into both eyes at bedtime.   MAGNESIUM PO Take by mouth.   Multiple Vitamin (MULTIVITAMIN) tablet Take 1 tablet by mouth daily.   Olopatadine HCl (PATADAY OP) Apply to eye.   Propylene Glycol (SYSTANE BALANCE OP) Apply to eye.   rosuvastatin (CRESTOR) 10 MG tablet Take 1 tablet (10 mg total) by mouth daily.   traZODone (DESYREL) 50 MG tablet Take 1 tablet (50 mg total) by mouth at bedtime as needed for sleep.   trolamine salicylate (ASPERCREME) 10 % cream Apply 1 application topically as needed for muscle pain.     Allergies:  Amitriptyline, Codeine, and Prednisone   Social History   Tobacco Use   Smoking status: Former    Types: Cigarettes    Quit date: 1990    Years since quitting: 32.7   Smokeless tobacco: Never  Vaping Use   Vaping Use: Never used  Substance Use Topics   Alcohol use: Yes    Alcohol/week: 2.0 standard drinks    Types: 2 Standard drinks or equivalent per week   Drug use: Not Currently     Family History: The patient's family history includes Aneurysm in her brother; Breast cancer in her maternal grandmother; Breast cancer (age of onset: 85) in her sister; Colon polyps in her sister; Esophageal cancer (age of  onset: 57) in her maternal aunt; Heart attack in her father; Heart disease in her mother; Multiple myeloma in her sister. There is no history of Colon cancer, Rectal cancer, or Stomach cancer.  ROS:   Please see the history of present illness.    All other systems reviewed and are negative.  EKGs/Labs/Other Studies Reviewed:    The following studies were reviewed today:  EKG:  n/a  Imaging studies that I have independently reviewed today: n/a  Recent Labs: 01/10/2021: Hemoglobin 13.6; Platelets 299 02/14/2021: ALT 19; BUN 10; Creatinine, Ser 0.74; Potassium 4.5; Sodium 138; TSH 1.70  Recent Lipid Panel    Component Value Date/Time   CHOL 189 02/14/2021 1434   TRIG 192.0 (H) 02/14/2021 1434   HDL 66.60 02/14/2021 1434   CHOLHDL 3 02/14/2021 1434   VLDL 38.4 02/14/2021 1434   LDLCALC 84 02/14/2021 1434    Physical Exam:    VS:  BP 124/72   Pulse 73   Ht 5' 5.5" (1.664 m)   Wt 205 lb (93 kg)   SpO2 96%   BMI 33.59 kg/m     Wt Readings from Last 5 Encounters:  04/12/21 205 lb (93 kg)  02/14/21 202 lb (91.6 kg)  09/01/20 210 lb 3.2 oz (95.3 kg)  08/14/20 198 lb (89.8 kg)  02/22/20 205 lb 3.2 oz (93.1 kg)    Constitutional: No acute distress Eyes: sclera non-icteric, normal conjunctiva and lids ENMT: normal dentition, moist mucous membranes Cardiovascular: regular rhythm, normal rate, no murmurs. S1 and S2 normal. Radial pulses normal bilaterally. No jugular venous distention.  Respiratory: clear to auscultation bilaterally GI : normal bowel sounds, soft and nontender. No distention.   MSK: extremities warm, well perfused. No edema.  NEURO: grossly nonfocal exam, moves all extremities. PSYCH: alert and oriented x 3, normal mood and affect.   ASSESSMENT:    1. Precordial pain   2. Ascending aorta dilatation (HCC)   3. Essential hypertension   4. Hyperlipidemia, unspecified hyperlipidemia type   5. Aortic atherosclerosis (HCC)    PLAN:    Precordial pain - Plan:  CT CORONARY MORPH W/CTA COR W/SCORE W/CA W/CM &/OR WO/CM, Basic metabolic panel Ascending aorta dilatation (HCC) - will obtain CCTA to evaluate possible cardiac chest pain. Will use this scan as her interval screening for thoracic aortic dilation.   Essential hypertension - continue amlodipine-benazepril 5-20 mg   Hyperlipidemia, unspecified hyperlipidemia type Aortic atherosclerosis ([place]) - she is concerned that symptoms may still be related to statin, can consider statin holiday. Otherwise, would continue crestor 10 mg daily.     Total time of encounter: 30 minutes total time of encounter, including 20 minutes spent in face-to-face patient care on the date of this encounter. This time includes coordination of care and counseling regarding  above mentioned problem list. Remainder of non-face-to-face time involved reviewing chart documents/testing relevant to the patient encounter and documentation in the medical record. I have independently reviewed documentation from referring provider.   Cherlynn Kaiser, MD, [place]   Shared Decision Making/Informed Consent:       Medication Adjustments/Labs and Tests Ordered: Current medicines are reviewed at length with the patient today.  Concerns regarding medicines are outlined above.   Orders Placed This Encounter  Procedures   CT CORONARY MORPH W/CTA COR W/SCORE W/CA W/CM &/OR WO/CM   Basic metabolic panel    No orders of the defined types were placed in this encounter.   Patient Instructions  Medication Instructions:  No Changes In Medications at this time.  OKAY TO HOLD STATIN TO EVALUATE FATIGUE FOR 2-3 WEEKS *If you need a refill on your cardiac medications before your next appointment, please call your pharmacy*  Lab Work: BMET- PLEASE HAVE THIS DONE 1 WEEK PRIOR TO [place]  If you have labs (blood work) drawn today and your tests are completely normal, you will receive your results only by: MyChart  Message (if you have MyChart) OR A paper copy in the mail If you have any lab test that is abnormal or we need to change your treatment, we will call you to review the results.  Testing/Procedures: Your physician has requested that you have cardiac CT. Cardiac computed tomography (CT) is a painless test that uses an x-ray machine to take clear, detailed pictures of your heart. For further information please visit [facility]uge[facility]iest[facility][email or web address]. Please follow instruction sheet as given.  Follow-Up: At [facility], you and your health needs are our priority.  As part of our continuing mission to provide you with exceptional heart care, we have created designated Provider Care Teams.  These Care Teams include your primary Cardiologist (physician) and Advanced Practice Providers (APPs -  Physician Assistants and Nurse Practitioners) who all work together to provide you with the care you need, when you need it.  Your next appointment:   October 27th 2022  The format for your next appointment:   In Person  Provider:   Cherlynn Kaiser, MD    Your cardiac CT will be scheduled at one of the below locations:   [facility] [address] [place], [place] [address] [phone]  If scheduled at [facility], please arrive at the [facility] main entrance (entrance A) of [facility] 30 minutes prior to test start time. Proceed to the [facility] Radiology Department (first floor) to check-in and test prep.  Please follow these instructions carefully (unless otherwise directed):  On the Night Before the Test: Be sure to Drink plenty of water. Do not consume any caffeinated/decaffeinated beverages or chocolate 12 hours prior to your test. Do not take any antihistamines 12 hours prior to your test.  On the Day of the Test: Drink plenty of water until 1 hour prior to the test. Do not eat any food 4 hours prior to the test. You may take your regular medications prior to the  test.  HOLD Furosemide/Hydrochlorothiazide morning of the test. FEMALES- please wear underwire-free bra if available, avoid dresses & tight clothing      After the Test: Drink plenty of water. After receiving IV contrast, you may experience a mild flushed feeling. This is normal. On occasion, you may experience a mild rash up to 24 hours after the test. This is not dangerous. If this occurs,  you can take Benadryl 25 mg and increase your fluid intake. If you experience trouble breathing, this can be serious. If it is severe call 911 IMMEDIATELY. If it is mild, please call our office. If you take any of these medications: Glipizide/Metformin, Avandament, Glucavance, please do not take 48 hours after completing test unless otherwise instructed.  Please allow 2-4 weeks for scheduling of routine cardiac CTs. Some insurance companies require a pre-authorization which may delay scheduling of this test.   For non-scheduling related questions, please contact the cardiac imaging nurse navigator should you have any questions/concerns: Marchia Bond, Cardiac Imaging Nurse Navigator Gordy Clement, Cardiac Imaging Nurse Navigator [facility] Direct Office Dial: [phone]   For scheduling needs, including cancellations and rescheduling, please call [place], [phone].

## 2021-04-12 NOTE — Patient Instructions (Addendum)
Medication Instructions:  No Changes In Medications at this time.  OKAY TO HOLD STATIN TO EVALUATE FATIGUE FOR 2-3 WEEKS *If you need a refill on your cardiac medications before your next appointment, please call your pharmacy*  Lab Work: BMET- PLEASE HAVE THIS DONE 1 WEEK PRIOR TO [place]  If you have labs (blood work) drawn today and your tests are completely normal, you will receive your results only by: MyChart Message (if you have MyChart) OR A paper copy in the mail If you have any lab test that is abnormal or we need to change your treatment, we will call you to review the results.  Testing/Procedures: Your physician has requested that you have cardiac CT. Cardiac computed tomography (CT) is a painless test that uses an x-ray machine to take clear, detailed pictures of your heart. For further information please visit [facility][email or web address]. Please follow instruction sheet as given.  Follow-Up: At [facility], you and your health needs are our priority.  As part of our continuing mission to provide you with exceptional heart care, we have created designated Provider Care Teams.  These Care Teams include your primary Cardiologist (physician) and Advanced Practice Providers (APPs -  Physician Assistants and Nurse Practitioners) who all work together to provide you with the care you need, when you need it.  Your next appointment:   October 27th 2022  The format for your next appointment:   In Person  Provider:   Cherlynn Kaiser, MD    Your cardiac CT will be scheduled at one of the below locations:   [facility] [address] [place], [place] [address] [phone]  If scheduled at [facility], please arrive at the [facility] main entrance (entrance A) of [facility] 30 minutes prior to test start time. Proceed to the [facility] Radiology Department (first floor) to check-in and test prep.  Please follow these instructions carefully (unless  otherwise directed):  On the Night Before the Test: Be sure to Drink plenty of water. Do not consume any caffeinated/decaffeinated beverages or chocolate 12 hours prior to your test. Do not take any antihistamines 12 hours prior to your test.  On the Day of the Test: Drink plenty of water until 1 hour prior to the test. Do not eat any food 4 hours prior to the test. You may take your regular medications prior to the test.  HOLD Furosemide/Hydrochlorothiazide morning of the test. FEMALES- please wear underwire-free bra if available, avoid dresses & tight clothing      After the Test: Drink plenty of water. After receiving IV contrast, you may experience a mild flushed feeling. This is normal. On occasion, you may experience a mild rash up to 24 hours after the test. This is not dangerous. If this occurs, you can take Benadryl 25 mg and increase your fluid intake. If you experience trouble breathing, this can be serious. If it is severe call 911 IMMEDIATELY. If it is mild, please call our office. If you take any of these medications: Glipizide/Metformin, Avandament, Glucavance, please do not take 48 hours after completing test unless otherwise instructed.  Please allow 2-4 weeks for scheduling of routine cardiac CTs. Some insurance companies require a pre-authorization which may delay scheduling of this test.   For non-scheduling related questions, please contact the cardiac imaging nurse navigator should you have any questions/concerns: Marchia Bond, Cardiac Imaging Nurse Navigator Gordy Clement, Cardiac Imaging Nurse Navigator [place] Direct Office Dial: [phone]  For scheduling needs, including cancellations and rescheduling, please call [place], [phone].

## 2021-05-01 DIAGNOSIS — R072 Precordial pain: Secondary | ICD-10-CM | POA: Diagnosis not present

## 2021-05-01 LAB — BASIC METABOLIC PANEL
BUN/Creatinine Ratio: 14 (ref 12–28)
BUN: 10 mg/dL (ref 8–27)
CO2: 24 mmol/L (ref 20–29)
Calcium: 9.8 mg/dL (ref 8.7–10.3)
Chloride: 100 mmol/L (ref 96–106)
Creatinine, Ser: 0.71 mg/dL (ref 0.57–1.00)
Glucose: 119 mg/dL — ABNORMAL HIGH (ref 70–99)
Potassium: 4.8 mmol/L (ref 3.5–5.2)
Sodium: 137 mmol/L (ref 134–144)
eGFR: 89 mL/min/{1.73_m2} (ref >59)

## 2021-05-07 ENCOUNTER — Telehealth (HOSPITAL_COMMUNITY): Payer: Self-pay | Admitting: Emergency Medicine

## 2021-05-07 NOTE — Telephone Encounter (Signed)
Reaching out to patient to offer assistance regarding upcoming cardiac imaging study; pt verbalizes understanding of appt date/time, parking situation and where to check in, pre-test NPO status and medications ordered, and verified current allergies; name and call back number provided for further questions should they arise Marchia Bond RN Navigator Cardiac Imaging [facility] [phone] office [phone] cell   Daily meds, takes zyrtec at night HS, Denies IV issues

## 2021-05-08 ENCOUNTER — Other Ambulatory Visit: Payer: Self-pay

## 2021-05-08 ENCOUNTER — Encounter (HOSPITAL_COMMUNITY): Payer: Self-pay

## 2021-05-08 ENCOUNTER — Ambulatory Visit (HOSPITAL_COMMUNITY)
Admission: RE | Admit: 2021-05-08 | Discharge: 2021-05-08 | Disposition: A | Discharge status: Home / Self Care | Payer: Medicare Other | Source: Ambulatory Visit | Attending: Internal Medicine | Admitting: Internal Medicine

## 2021-05-08 DIAGNOSIS — R072 Precordial pain: Secondary | ICD-10-CM | POA: Diagnosis not present

## 2021-05-08 MED ORDER — IOHEXOL 350 MG/ML SOLN
95.0000 mL | Freq: Once | INTRAVENOUS | Status: AC | PRN
  Administered 2021-05-08: 95 mL via INTRAVENOUS

## 2021-05-08 MED ORDER — NITROGLYCERIN 0.4 MG SL SUBL
0.8000 mg | SUBLINGUAL_TABLET | Freq: Once | SUBLINGUAL | Status: AC
  Administered 2021-05-08: 0.8 mg via SUBLINGUAL

## 2021-05-08 MED ORDER — NITROGLYCERIN 0.4 MG SL SUBL
SUBLINGUAL_TABLET | SUBLINGUAL | Status: AC
  Filled 2021-05-08: qty 2

## 2021-05-15 NOTE — Progress Notes (Signed)
Cardiology Office Note:    Date:  05/17/2021   ID:  KANG ISHIDA, DOB 01/25/1946, MRN [id number]  PCP:  Pleas [name], NP  Cardiologist:  Elouise Munroe, MD  Electrophysiologist:  None   Referring MD: Pleas [name], NP   Chief Complaint/Reason for Referral: Follow up testing.  History of Present Illness:    Marie Ruiz is a 75 y.o. female with a history of aortic aneurysm, prior positive lupus anticoagulant with normalization off of anticoagulation, DVT felt to be provoked 10/2017, hypertension, hyperlipidemia, and prediabetes coming in today for follow-up of her CCTA.   Today, she is doing well. We discussed the results of her CCTA, no plaque or coronary calcification. Normal coronary CTA.   Her leg discomfort resolved off of crestor. She does not take a blood thinner for her history of DVT, as it was felt to be provoked after a long car trip. However, she is on 81 mg aspirin, self started. The DVT occurred behind her L knee 4 years ago after driving. She still experiences L knee pain from arthritis. She did see a hematologist Dr. Walden Field in 12/2018. After the patient's visit today I have reviewed these records in detail. She was noted to have a positive lupus anticoagulant. Negative Factor V Leiden, Prothrombin gene mutation and normal B2 Glycoprotein.  She was diagnosed with left posterior tibial and peroneal vein DVT in April 2019 at which time she was on Xarelto.  Repeat lower extremity Dopplers in 04/2018 were consistent with chronic deep vein thrombosis but improved from initial diagnosis.  Hypercoagulable work-up performed while on anticoagulation, as noted above.  Hematology reviewed and recommended repeat hypercoagulable work-up off of anticoagulation.  She had a negative lupus anticoagulant and normal range DRVVT in October 2020.  She reports occasionally experiencing bilateral LE edema. We discussed compression socks and venous insufficiency.   For formal  exercise, she wants to join [employer] and already attends as her husbands guest. Currently, she only goes when her husband wants to. She prefers to use the bike than walking normally but does well on the treadmill as well.  The patient denies chest pain, chest pressure, dyspnea at rest or with exertion, PND, or orthopnea. Denies cough, fever, chills, nausea, or vomiting. Denies syncope, presyncope, or snoring. Denies dizziness or lightheadedness.    Past Medical History:  Diagnosis Date   Allergy    seasonal   Anxiety    Aortic aneurysm ([place]) 08/2018   Arthritis    Blood clotting disorder (HCC)    Bone spur    with excision   Chickenpox    Colon polyps    DVT (deep venous thrombosis) (HCC)    Glaucoma    both   Graves disease 03/30/2013   History of blood clots 10/2017   Hyperlipemia    Hypertension    Osteopenia    Prediabetes    Restless leg syndrome     Past Surgical History:  Procedure Laterality Date   BREAST CYST ASPIRATION     CATARACT EXTRACTION W/ INTRAOCULAR LENS IMPLANT Right 2015   CATARACT EXTRACTION W/PHACO Left 2017   COLONOSCOPY  2014   DILATION AND CURETTAGE OF UTERUS      Current Medications: Current Meds  Medication Sig   acetaminophen (TYLENOL) 500 MG tablet Take 500 mg by mouth as needed for mild pain.   amLODipine-benazepril (LOTREL) 5-20 MG capsule TAKE 1 CAPSULE BY MOUTH EVERY DAY FOR BLOOD PRESSURE   b complex vitamins capsule  Take 1 capsule by mouth daily.   brimonidine (ALPHAGAN) 0.2 % ophthalmic solution 1 drop 2 (two) times daily.   Calcium Carbonate-Vit D-Min (CALCIUM 1200 PO) Take by mouth.   cetirizine (ZYRTEC) 10 MG tablet Take 10 mg by mouth daily.   cholecalciferol (VITAMIN D) 1000 units tablet Take 1,000 Units by mouth daily.   Coenzyme Q10 (COQ10 PO) Take 300 mg by mouth daily.   dorzolamide-timolol (COSOPT) 22.3-6.8 MG/ML ophthalmic solution Place 1 drop into both eyes 2 (two) times daily.   FLUoxetine (PROZAC) 10 MG capsule  TAKE 1 CAPSULE BY MOUTH EVERY DAY FOR ANXIETY   latanoprost (XALATAN) 0.005 % ophthalmic solution Place 1 drop into both eyes at bedtime.   MAGNESIUM PO Take by mouth.   Multiple Vitamin (MULTIVITAMIN) tablet Take 1 tablet by mouth daily.   Olopatadine HCl (PATADAY OP) Apply to eye.   Propylene Glycol (SYSTANE BALANCE OP) Apply to eye.   rosuvastatin (CRESTOR) 10 MG tablet Take 1 tablet (10 mg total) by mouth daily.   traZODone (DESYREL) 50 MG tablet Take 1 tablet (50 mg total) by mouth at bedtime as needed for sleep.   trolamine salicylate (ASPERCREME) 10 % cream Apply 1 application topically as needed for muscle pain.     Allergies:   Amitriptyline, Codeine, and Prednisone   Social History   Tobacco Use   Smoking status: Former    Types: Cigarettes    Quit date: 1990    Years since quitting: 32.8   Smokeless tobacco: Never  Vaping Use   Vaping Use: Never used  Substance Use Topics   Alcohol use: Yes    Alcohol/week: 2.0 standard drinks    Types: 2 Standard drinks or equivalent per week   Drug use: Not Currently     Family History: The patient's family history includes Aneurysm in her brother; Breast cancer in her maternal grandmother; Breast cancer (age of onset: 45) in her sister; Colon polyps in her sister; Esophageal cancer (age of onset: 25) in her maternal aunt; Heart attack in her father; Heart disease in her mother; Multiple myeloma in her sister. There is no history of Colon cancer, Rectal cancer, or Stomach cancer.  ROS:   Please see the history of present illness.   (+) L Knee Pain  (+) Bilateral LE edema All other systems reviewed and are negative.  EKGs/Labs/Other Studies Reviewed:    The following studies were reviewed today: CCTA 10/18/222 1. No evidence of CAD, CADRADS = 0. 2. Coronary calcium score of 0. 3. Normal coronary origins with right dominance. 4. Borderline ascending aorta dimension, upper limit of normal 39 mm at mid ascending aorta.  Echo  08/28/20 1. Ascending aorta measures 40 mm which is likely normal for age. Stable  with CTA 04/06/2019.   2. Left ventricular ejection fraction, by estimation, is 55 to 60%. The  left ventricle has normal function. The left ventricle has no regional  wall motion abnormalities. There is moderate concentric left ventricular  hypertrophy. Left ventricular  diastolic parameters are consistent with Grade I diastolic dysfunction  (impaired relaxation).   3. Right ventricular systolic function is normal. The right ventricular  size is normal. Tricuspid regurgitation signal is inadequate for assessing  PA pressure.   4. The mitral valve is grossly normal. No evidence of mitral valve  regurgitation. No evidence of mitral stenosis.   5. The aortic valve is tricuspid. Aortic valve regurgitation is not  visualized. No aortic stenosis is present.   6. The inferior  vena cava is normal in size with greater than 50%  respiratory variability, suggesting right atrial pressure of 3 mmHg.   EKG:   05/17/21: Sinus rhythm, rate 61 bpm 09/01/20: NSR  Imaging studies that I have independently reviewed today: coronary CTA 05/08/21  Recent Labs: 01/10/2021: Hemoglobin 13.6; Platelets 299 02/14/2021: ALT 19; TSH 1.70 05/01/2021: BUN 10; Creatinine, Ser 0.71; Potassium 4.8; Sodium 137  Recent Lipid Panel    Component Value Date/Time   CHOL 189 02/14/2021 1434   TRIG 192.0 (H) 02/14/2021 1434   HDL 66.60 02/14/2021 1434   CHOLHDL 3 02/14/2021 1434   VLDL 38.4 02/14/2021 1434   LDLCALC 84 02/14/2021 1434    Physical Exam:    VS:  BP 122/80   Pulse 61   Ht 5' 5.5" (1.664 m)   Wt 202 lb 6.4 oz (91.8 kg)   SpO2 99%   BMI 33.17 kg/m     Wt Readings from Last 5 Encounters:  05/17/21 202 lb 6.4 oz (91.8 kg)  04/12/21 205 lb (93 kg)  02/14/21 202 lb (91.6 kg)  09/01/20 210 lb 3.2 oz (95.3 kg)  08/14/20 198 lb (89.8 kg)    Constitutional: No acute distress Eyes: sclera non-icteric, normal conjunctiva  and lids ENMT: normal dentition, moist mucous membranes Cardiovascular: regular rhythm, normal rate, no murmurs. S1 and S2 normal. Radial pulses normal bilaterally. No jugular venous distention.  Respiratory: clear to auscultation bilaterally GI : normal bowel sounds, soft and nontender. No distention.   MSK: extremities warm, well perfused. No edema.  NEURO: grossly nonfocal exam, moves all extremities. PSYCH: alert and oriented x 3, normal mood and affect.   ASSESSMENT:    1. Mixed hyperlipidemia   2. Precordial pain   3. Essential hypertension   4. Dilation of aorta (HCC)   5. Deep vein thrombosis (DVT) of popliteal vein of left lower extremity, unspecified chronicity (HCC)   6. Bilateral leg edema   7. Medication management    PLAN:    Mixed hyperlipidemia - Plan: Lipid panel -She has had a relatively well-controlled LDL, but had to stop Crestor due to lower extremity discomfort.  Triglycerides remain elevated.  We will recheck labs in 3 months (December), and she will try aggressive lifestyle modification in the interim.  Given no coronary calcification and no coronary plaque, if lipids are within normal limits, she may be able to forego statin therapy.  However if still elevated we can consider using Zetia or Vascepa for triglycerides, will consider evaluation in [facility] lipid clinic.  She was on 10 mg of Crestor, could try 5 mg daily or every other day to retrial.  She is on co-Q10 but can stop this if not on a statin.  Precordial pain-no recurrence since last visit.  Normal coronary CTA.  Essential hypertension-blood pressure is well controlled today, continues on amlodipine-benazepril 5-20 mg daily, continue at current dose.  Dilation of aorta (HCC)-she has a borderline measurement of her ascending aorta, 39 mm.  We discussed that this likely does not have to be followed as closely given stability over time.  We discussed that this does not represent aneurysm formation.  She would  like to get back to exercise and while her aorta is not a worrisome size, I have advised her against taking up a aggressive weight lifting program aside from that she can exercise as she sees fit.  Unlikely to need repeat cross-sectional imaging for several years, will consider repeat echo in 2 to 3 years.  Prior DVT-hypercoagulable work-up off of anticoagulation for prior provoked DVT showed normalization of her lupus anticoagulant.  She was taken off of anticoagulation by hematology and not restarted.  With normal coronary CTA she does not likely need a 81 mg aspirin for primary prevention of CAD nor does she need it for prior DVT.  LE edema- Compression socks for LE edema  Total time of encounter: 30 minutes total time of encounter, including 25 minutes spent in face-to-face patient care on the date of this encounter. This time includes coordination of care and counseling regarding above mentioned problem list. Remainder of non-face-to-face time involved reviewing chart documents/testing relevant to the patient encounter and documentation in the medical record. I have independently reviewed documentation from referring provider.   Marie Kaiser, MD, [place]   Shared Decision Making/Informed Consent:       Medication Adjustments/Labs and Tests Ordered: Current medicines are reviewed at length with the patient today.  Concerns regarding medicines are outlined above.   Orders Placed This Encounter  Procedures   Lipid panel     No orders of the defined types were placed in this encounter.   Patient Instructions  Medication Instructions:  No Changes In Medications at this time.  *If you need a refill on your cardiac medications before your next appointment, please call your pharmacy*  Lab Work: [place] BLOOD WORK IN December- NO APPOINTMENT IS NEEDED. LAB HOURS ARE 8am-4pm If you have labs (blood work) drawn today and your tests are  completely normal, you will receive your results only by: [place] (if you have MyChart) OR A paper copy in the mail If you have any lab test that is abnormal or we need to change your treatment, we will call you to review the results.  Follow-Up: At [facility], you and your health needs are our priority.  As part of our continuing mission to provide you with exceptional heart care, we have created designated Provider Care Teams.  These Care Teams include your primary Cardiologist (physician) and Advanced Practice Providers (APPs -  Physician Assistants and Nurse Practitioners) who all work together to provide you with the care you need, when you need it.  Your next appointment:   6 month(s)  The format for your next appointment:   In Person  Provider:   Cherlynn Kaiser, MD   Wilhemina Bonito as a scribe for Elouise Munroe, MD.,have documented all relevant documentation on the behalf of Elouise Munroe, MD,as directed by  Elouise Munroe, MD while in the presence of Elouise Munroe, MD.  I, Elouise Munroe, MD, have reviewed all documentation for this visit. The documentation on 05/17/21 for the exam, diagnosis, procedures, and orders are all accurate and complete.

## 2021-05-17 ENCOUNTER — Telehealth: Payer: Self-pay | Admitting: Internal Medicine

## 2021-05-17 ENCOUNTER — Other Ambulatory Visit: Payer: Self-pay

## 2021-05-17 ENCOUNTER — Ambulatory Visit (INDEPENDENT_AMBULATORY_CARE_PROVIDER_SITE_OTHER): Payer: Medicare Other | Admitting: Internal Medicine

## 2021-05-17 VITALS — BP 122/80 | HR 61 | Ht 65.5 in | Wt 202.4 lb

## 2021-05-17 DIAGNOSIS — R600 Localized edema: Secondary | ICD-10-CM | POA: Diagnosis not present

## 2021-05-17 DIAGNOSIS — E782 Mixed hyperlipidemia: Secondary | ICD-10-CM

## 2021-05-17 DIAGNOSIS — Z79899 Other long term (current) drug therapy: Secondary | ICD-10-CM | POA: Diagnosis not present

## 2021-05-17 DIAGNOSIS — R072 Precordial pain: Secondary | ICD-10-CM | POA: Diagnosis not present

## 2021-05-17 DIAGNOSIS — I10 Essential (primary) hypertension: Secondary | ICD-10-CM | POA: Diagnosis not present

## 2021-05-17 DIAGNOSIS — I82432 Acute embolism and thrombosis of left popliteal vein: Secondary | ICD-10-CM | POA: Diagnosis not present

## 2021-05-17 DIAGNOSIS — I77819 Aortic ectasia, unspecified site: Secondary | ICD-10-CM | POA: Diagnosis not present

## 2021-05-17 NOTE — Patient Instructions (Signed)
Medication Instructions:  No Changes In Medications at this time.  *If you need a refill on your cardiac medications before your next appointment, please call your pharmacy*  Lab Work: [place] BLOOD WORK IN December- NO APPOINTMENT IS NEEDED. LAB HOURS ARE 8am-4pm If you have labs (blood work) drawn today and your tests are completely normal, you will receive your results only by: [place] (if you have MyChart) OR A paper copy in the mail If you have any lab test that is abnormal or we need to change your treatment, we will call you to review the results.  Follow-Up: At [facility], you and your health needs are our priority.  As part of our continuing mission to provide you with exceptional heart care, we have created designated Provider Care Teams.  These Care Teams include your primary Cardiologist (physician) and Advanced Practice Providers (APPs -  Physician Assistants and Nurse Practitioners) who all work together to provide you with the care you need, when you need it.  Your next appointment:   6 month(s)  The format for your next appointment:   In Person  Provider:   Cherlynn Kaiser, MD

## 2021-05-17 NOTE — Telephone Encounter (Signed)
Patient calling back with the name of the doctor who is ordering her lab work. She says it is Dr. Walden Field from [facility] at [facility]. She says she does not need a call back.

## 2021-05-22 NOTE — Addendum Note (Signed)
Addended by: Linton Ham on: 05/22/2021 03:19 PM   Modules accepted: Orders

## 2021-06-17 IMAGING — CT CT ANGIO CHEST
2 of 7 series · 18 of 46 positions shown · IV contrast (OMNIPAQUE 350)
Comparison: None.

CLINICAL DATA: Thoracic aortic aneurysm.

EXAM:
CT ANGIOGRAPHY CHEST WITH CONTRAST
TECHNIQUE: Multidetector CT imaging of the chest was performed using the
standard protocol during bolus administration of intravenous
contrast. Multiplanar CT image reconstructions and MIPs were
obtained to evaluate the vascular anatomy.
CONTRAST:  100mL OMNIPAQUE IOHEXOL 350 MG/ML SOLN

[Series 4: aorta 3.0 i31f 2 · axial · 0.65mm/px · z∈[-260,-14]mm · 15 of 90 slices shown]
[im 4/90  lung]
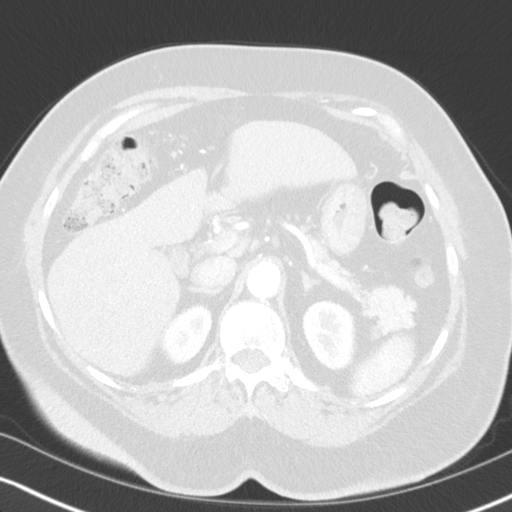
[im 10/90  soft-tissue]
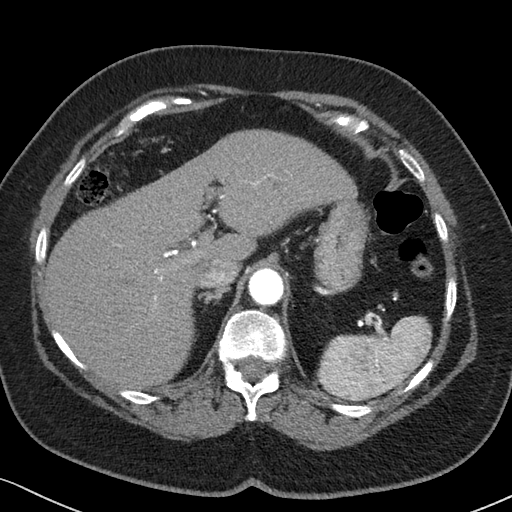
[im 16/90  lung]
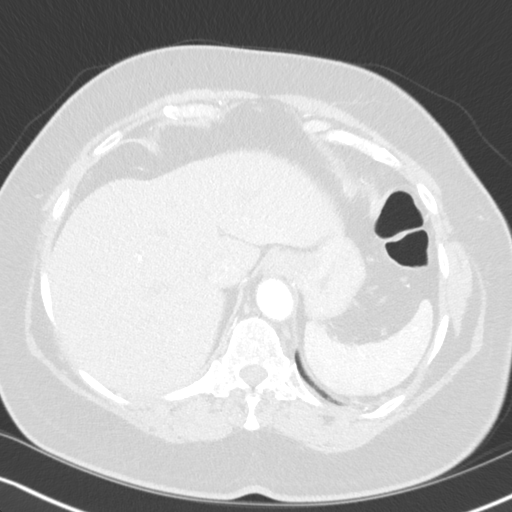
[im 23/90  soft-tissue]
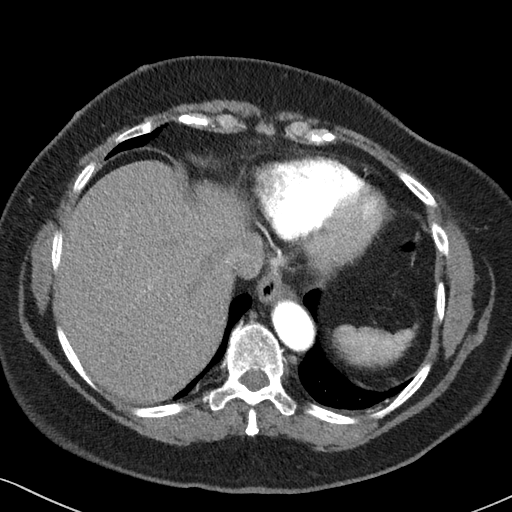
[im 29/90  lung]
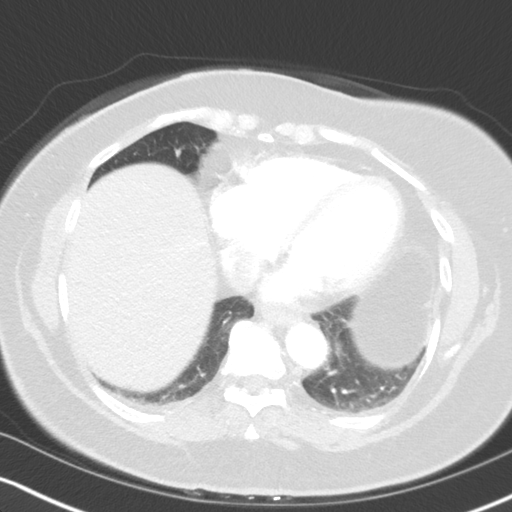
[im 32/90  soft-tissue]
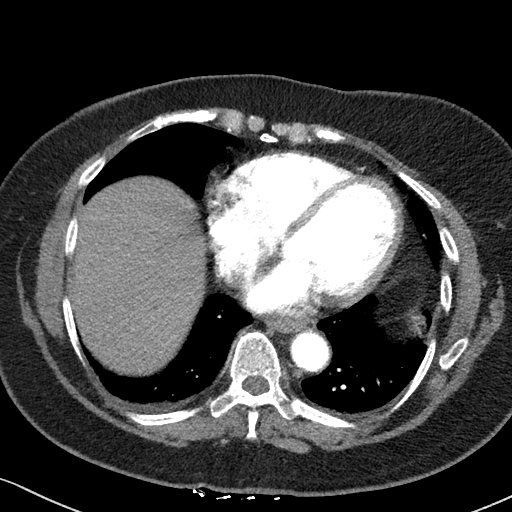
[im 39/90  lung]
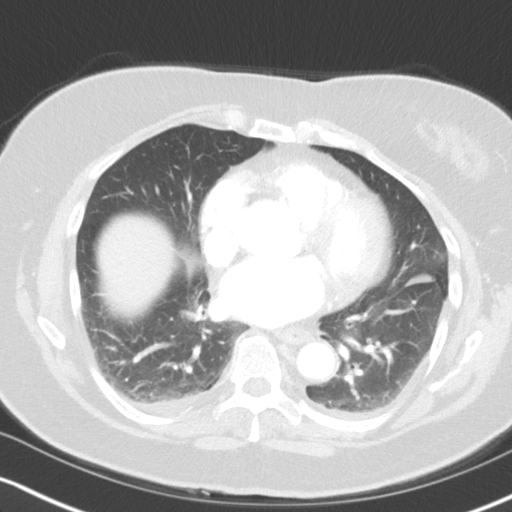
[im 45/90  soft-tissue]
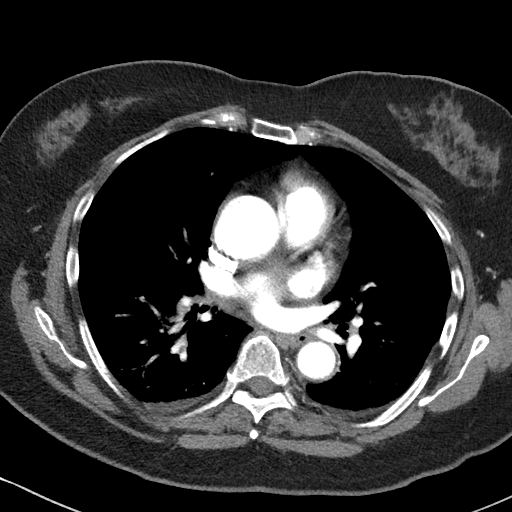
[im 51/90  lung]
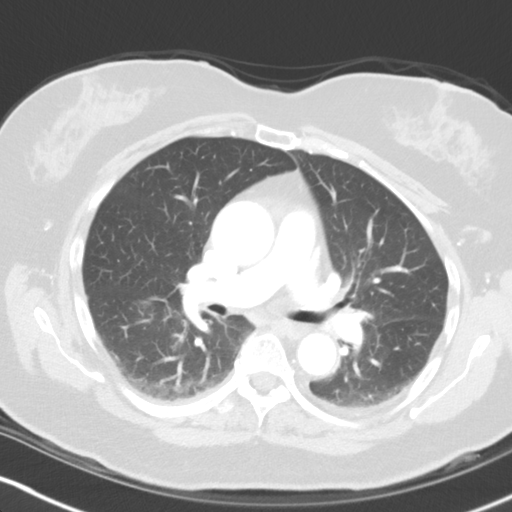
[im 58/90  soft-tissue]
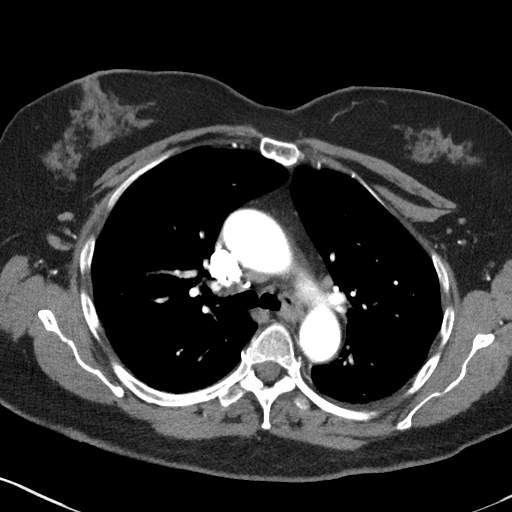
[im 61/90  lung]
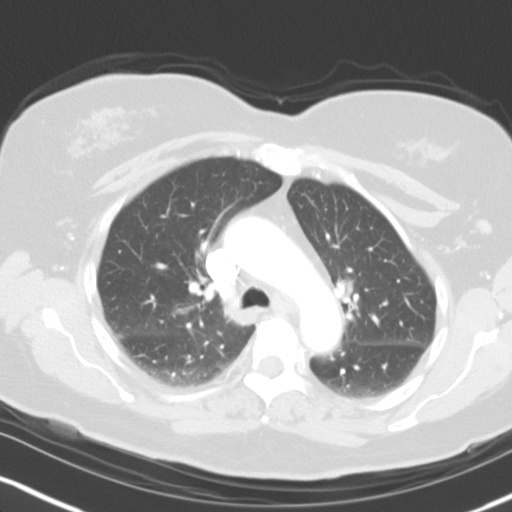
[im 67/90  soft-tissue]
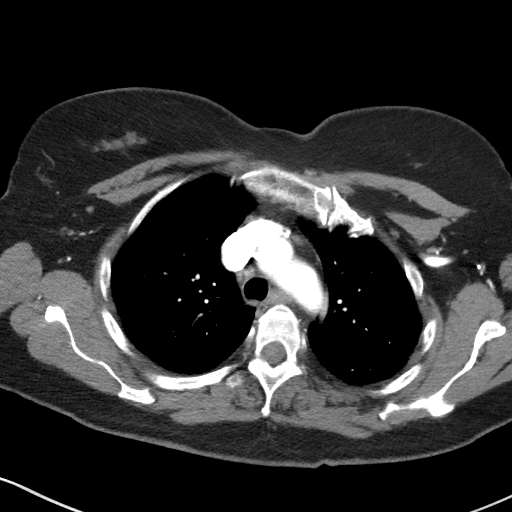
[im 74/90  lung]
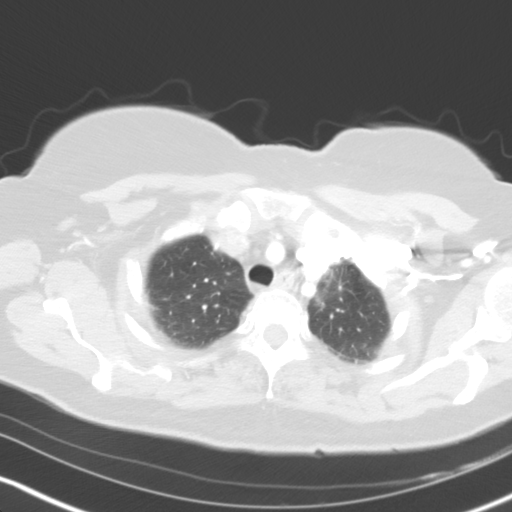
[im 80/90  soft-tissue]
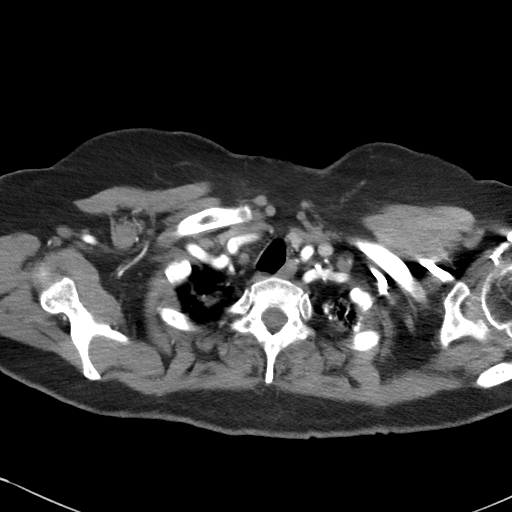
[im 86/90  lung]
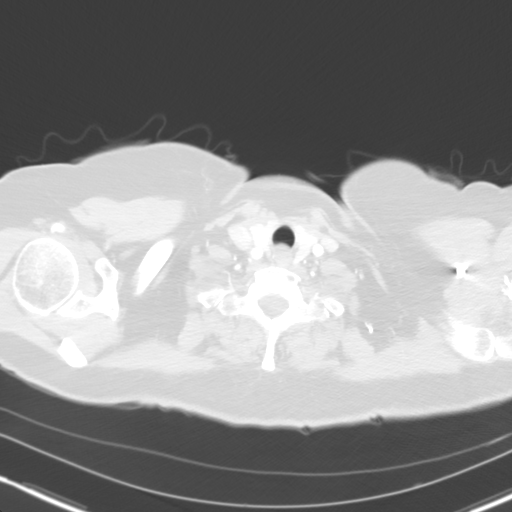

[Series 7: coronals · coronal · 0.53mm/px · 3 of 116 slices shown]
[im 29/116  soft-tissue]
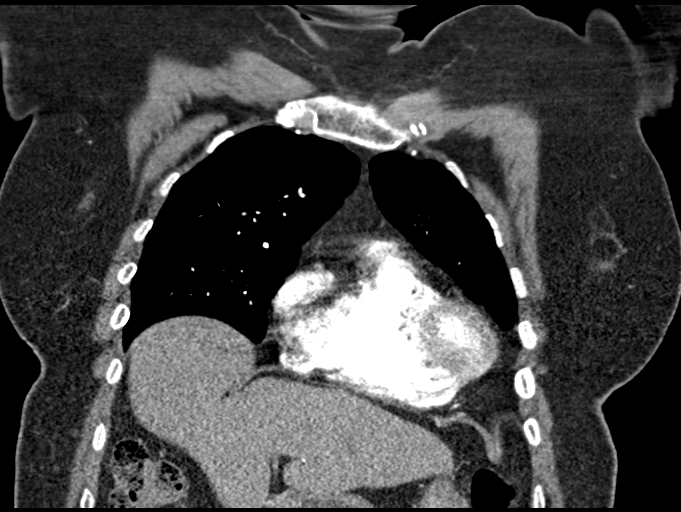
[im 58/116  soft-tissue]
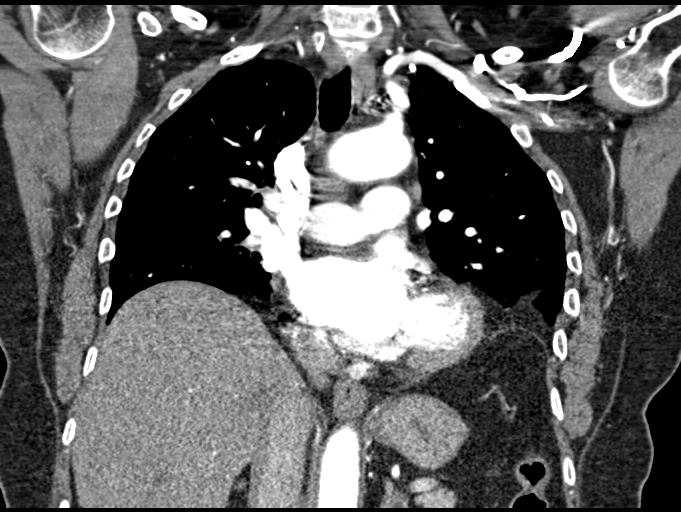
[im 87/116  soft-tissue]
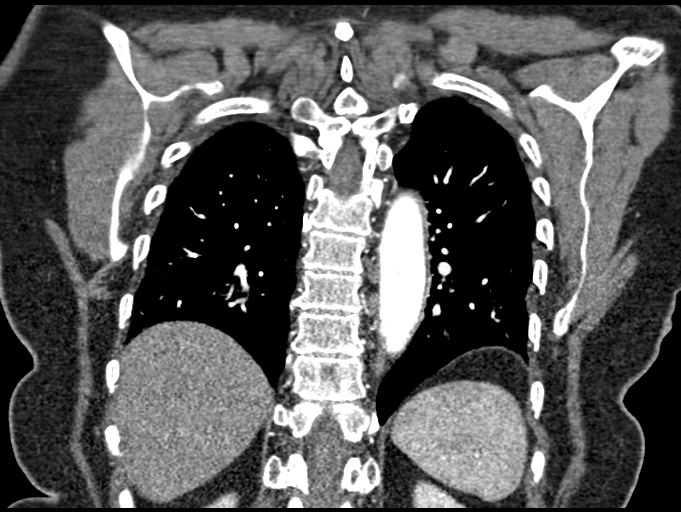

[18 of 46 positions shown; findings below may reference images not displayed]

FINDINGS: Cardiovascular: 4.0 cm ascending thoracic aortic aneurysm is noted
without dissection. Normal cardiac size. No pericardial effusion.

Mediastinum/Nodes: No enlarged mediastinal, hilar, or axillary lymph
nodes. Thyroid gland, trachea, and esophagus demonstrate no
significant findings.

Lungs/Pleura: Lungs are clear. No pleural effusion or pneumothorax.

Upper Abdomen: No acute abnormality.

Musculoskeletal: No chest wall abnormality. No acute or significant
osseous findings.

Review of the MIP images confirms the above findings.
IMPRESSION: 4.0 cm ascending thoracic aortic aneurysm. Recommend annual imaging
followup by CTA or MRA. This recommendation follows 2353
ACCF/AHA/AATS/ACR/ASA/SCA/TIGER/KINCHEN/TEMBA/[place]OM[place] Guidelines for the
Diagnosis and Management of Patients with Thoracic Aortic Disease.
Circulation. 2353; 121: [id number]. Aortic aneurysm NOS
([id number]).

## 2021-06-25 DIAGNOSIS — N390 Urinary tract infection, site not specified: Secondary | ICD-10-CM | POA: Diagnosis not present

## 2021-06-29 DIAGNOSIS — E785 Hyperlipidemia, unspecified: Secondary | ICD-10-CM | POA: Diagnosis not present

## 2021-06-29 LAB — LIPID PANEL
Chol/HDL Ratio: 3.5 ratio (ref 0.0–4.4)
Cholesterol, Total: 214 mg/dL — ABNORMAL HIGH (ref 100–199)
HDL: 62 mg/dL (ref >39)
LDL Chol Calc (NIH): 128 mg/dL — ABNORMAL HIGH (ref 0–99)
Triglycerides: 138 mg/dL (ref 0–149)
VLDL Cholesterol Cal: 24 mg/dL (ref 5–40)

## 2021-07-06 ENCOUNTER — Telehealth: Payer: Self-pay | Admitting: Internal Medicine

## 2021-07-06 DIAGNOSIS — E785 Hyperlipidemia, unspecified: Secondary | ICD-10-CM

## 2021-07-06 DIAGNOSIS — E782 Mixed hyperlipidemia: Secondary | ICD-10-CM

## 2021-07-06 DIAGNOSIS — Z79899 Other long term (current) drug therapy: Secondary | ICD-10-CM

## 2021-07-06 NOTE — Telephone Encounter (Signed)
Patient called for lab results.  She needs to know if she needs to go back on her statin or not.

## 2021-07-06 NOTE — Telephone Encounter (Signed)
Attempted to contact patient to discuss, number was busy.  Will try again at another time.

## 2021-07-12 NOTE — Telephone Encounter (Signed)
Attempted to call patient, left message for patient to call back to office.   

## 2021-07-25 MED ORDER — ROSUVASTATIN CALCIUM 10 MG PO TABS: 10.0000 mg | ORAL_TABLET | ORAL | 1 refills | Status: DC

## 2021-07-25 MED ORDER — ROSUVASTATIN CALCIUM 5 MG PO TABS: 5.0000 mg | ORAL_TABLET | ORAL | 6 refills | Status: DC

## 2021-07-25 NOTE — Telephone Encounter (Signed)
LDL has increased while off of statin - recommend resuming crestor 5 mg three times a week on Mon, Wed, Fri, recheck lipids in 3 mo and referral to CV[facility] lipid clinic.  Elouise Munroe, MD;07/11/2021  3:19 PM EST Pt notified of above. She will await call from scheduling.  Referral entered. Rx sent to requested pharmacy

## 2021-07-25 NOTE — Telephone Encounter (Signed)
Patient returning call.

## 2021-07-26 ENCOUNTER — Other Ambulatory Visit: Payer: Self-pay | Admitting: Primary Care

## 2021-07-26 DIAGNOSIS — I10 Essential (primary) hypertension: Secondary | ICD-10-CM

## 2021-07-26 NOTE — Telephone Encounter (Signed)
Send letter. Needs office visit in February for further refills.

## 2021-08-01 DIAGNOSIS — N390 Urinary tract infection, site not specified: Secondary | ICD-10-CM | POA: Diagnosis not present

## 2021-08-01 NOTE — Telephone Encounter (Signed)
Patient has appointment 1/27

## 2021-08-09 ENCOUNTER — Telehealth: Payer: Self-pay | Admitting: *Deleted

## 2021-08-09 NOTE — Telephone Encounter (Signed)
A message was left, re: scheduling appointment with the pharmacist.

## 2021-08-14 ENCOUNTER — Telehealth: Payer: Self-pay | Admitting: Primary Care

## 2021-08-14 NOTE — Telephone Encounter (Signed)
LVM for pt to rtn my call to schedule AWV with [facility]. Please schedule this appt if pt calls the office.  °

## 2021-08-17 ENCOUNTER — Ambulatory Visit: Payer: Medicare Other | Admitting: Primary Care

## 2021-08-18 ENCOUNTER — Other Ambulatory Visit: Payer: Self-pay | Admitting: Primary Care

## 2021-08-18 DIAGNOSIS — I10 Essential (primary) hypertension: Secondary | ICD-10-CM

## 2021-08-22 ENCOUNTER — Other Ambulatory Visit: Payer: Self-pay

## 2021-08-22 ENCOUNTER — Ambulatory Visit (INDEPENDENT_AMBULATORY_CARE_PROVIDER_SITE_OTHER): Payer: Medicare Other | Admitting: Primary Care

## 2021-08-22 ENCOUNTER — Encounter: Payer: Self-pay | Admitting: Primary Care

## 2021-08-22 VITALS — BP 120/76 | HR 64 | Temp 97.8°F | Ht 65.5 in | Wt 188.0 lb

## 2021-08-22 DIAGNOSIS — E0500 Thyrotoxicosis with diffuse goiter without thyrotoxic crisis or storm: Secondary | ICD-10-CM | POA: Diagnosis not present

## 2021-08-22 DIAGNOSIS — R634 Abnormal weight loss: Secondary | ICD-10-CM | POA: Insufficient documentation

## 2021-08-22 LAB — POC URINALSYSI DIPSTICK (AUTOMATED)
Bilirubin, UA: NEGATIVE
Blood, UA: NEGATIVE
Glucose, UA: NEGATIVE
Ketones, UA: NEGATIVE
Leukocytes, UA: NEGATIVE
Nitrite, UA: NEGATIVE
Protein, UA: NEGATIVE
Spec Grav, UA: 1.01 (ref 1.010–1.025)
Urobilinogen, UA: 0.2 E.U./dL
pH, UA: 7 (ref 5.0–8.0)

## 2021-08-22 LAB — BASIC METABOLIC PANEL
BUN: 10 mg/dL (ref 6–23)
CO2: 31 mEq/L (ref 19–32)
Calcium: 9.9 mg/dL (ref 8.4–10.5)
Chloride: 102 mEq/L (ref 96–112)
Creatinine, Ser: 0.68 mg/dL (ref 0.40–1.20)
GFR: 85.12 mL/min (ref >60.00)
Glucose, Bld: 92 mg/dL (ref 70–99)
Potassium: 4.3 mEq/L (ref 3.5–5.1)
Sodium: 137 mEq/L (ref 135–145)

## 2021-08-22 LAB — HEMOGLOBIN A1C: Hgb A1c MFr Bld: 5.5 % (ref 4.6–6.5)

## 2021-08-22 LAB — TSH: TSH: 1.29 u[IU]/mL (ref 0.35–5.50)

## 2021-08-22 NOTE — Assessment & Plan Note (Signed)
Repeat TSH with Free T4 pending today given weight loss. Prior TSH readings reviewed in chart and are within normal range.

## 2021-08-22 NOTE — Assessment & Plan Note (Signed)
Seems to be intended, but will check labs today to be sure. She is making healthier choices and eating smaller portions.   Checking TSH and Free T4, BMP, A1C. UA today negative.  Await results.

## 2021-08-22 NOTE — Patient Instructions (Signed)
Stop by the lab prior to leaving today. I will notify you of your results once received.   It was a pleasure to see you today!  

## 2021-08-22 NOTE — Progress Notes (Signed)
Subjective:    Patient ID: Marie Ruiz, female    DOB: 08/17/1945, 76 y.o.   MRN: [id number]  HPI  Marie Ruiz is a very pleasant 76 y.o. female with a history of hypertension, DVT, ascending aortic aneurysm, GAD, hyperlipidemia, GAD, glaucoma who presents today to discuss weight and recurrent UTI's.  1) Obesity/Weight Loss: Weight loss of 17 pounds over the last 5 months. She's been busy caring for her ill husband who has been in the hospital and SNF rehab, and so she hasn't had much time to eat. She is eating smaller portion sizes. She is intentionally making healthier choices.   She denies rectal and vaginal bleeding, night sweats. She endorses a prior history of "overactive thyroid" years ago. Was taking a medication daily, has been off for years. She denies increased palpitations, dizziness, headaches. Last TSH was in July 2020 with level of 1.70.   Wt Readings from Last 3 Encounters:  08/22/21 188 lb (85.3 kg)  05/17/21 202 lb 6.4 oz (91.8 kg)  04/12/21 205 lb (93 kg)    2) History of UTI: She presented to Urgent Care that was out of town in  December 2022, symptoms of urinary frequency and right lower back pain. She was diagnosed with cystitis, prescribed cephalexin 500 mg BID x 7 daily. Symptoms improved. She returned three weeks later to the same Urgent Care, same symptoms, treated with an antibiotic, was called a few days later and was told that she did not have a UTI.   She denies urinary symptoms today. She denies hematuria, vaginal symptoms. No history of recurrent UTI.     Review of Systems  Constitutional:  Negative for fatigue.  Eyes:  Negative for visual disturbance.  Respiratory:  Negative for shortness of breath.   Cardiovascular:  Positive for palpitations. Negative for chest pain.  Genitourinary:  Negative for dysuria, frequency, hematuria and vaginal discharge.  Neurological:  Negative for dizziness and headaches.  Psychiatric/Behavioral:   Negative for sleep disturbance.         Past Medical History:  Diagnosis Date   Allergy    seasonal   Anxiety    Aortic aneurysm ([place]) 08/2018   Arthritis    Blood clotting disorder (HCC)    Bone spur    with excision   Chickenpox    Colon polyps    DVT (deep venous thrombosis) (HCC)    Glaucoma    both   Graves disease 03/30/2013   History of blood clots 10/2017   Hyperlipemia    Hypertension    Osteopenia    Prediabetes    Restless leg syndrome     Social History   Socioeconomic History   Marital status: Married    Spouse name: Not on file   Number of children: Not on file   Years of education: Not on file   Highest education level: Not on file  Occupational History   Occupation: Retired  Tobacco Use   Smoking status: Former    Types: Cigarettes    Quit date: 1990    Years since quitting: 33.1   Smokeless tobacco: Never  Vaping Use   Vaping Use: Never used  Substance and Sexual Activity   Alcohol use: Yes    Alcohol/week: 2.0 standard drinks    Types: 2 Standard drinks or equivalent per week   Drug use: Not Currently   Sexual activity: Not Currently  Other Topics Concern   Not on file  Social History Narrative  Lives in [place], moving to [place].   Retired.   Married.    Social Determinants of Radio broadcast assistant Strain: Not on file  Food Insecurity: Not on file  Transportation Needs: Not on file  Physical Activity: Not on file  Stress: Not on file  Social Connections: Not on file  Intimate Partner Violence: Not on file    Past Surgical History:  Procedure Laterality Date   BREAST CYST ASPIRATION     CATARACT EXTRACTION W/ INTRAOCULAR LENS IMPLANT Right 2015   CATARACT EXTRACTION W/PHACO Left 2017   COLONOSCOPY  2014   DILATION AND CURETTAGE OF UTERUS      Family History  Problem Relation Age of Onset   Heart disease Mother    Heart attack Father    Breast cancer Sister 45   Colon polyps Sister    Aneurysm Brother     Multiple myeloma Sister        found in stage 4   Breast cancer Maternal Grandmother    Esophageal cancer Maternal Aunt 82   Colon cancer Neg Hx    Rectal cancer Neg Hx    Stomach cancer Neg Hx     Allergies  Allergen Reactions   Amitriptyline Other (See Comments)    hallucinations   Codeine    Prednisone     Depression    Current Outpatient Medications on File Prior to Visit  Medication Sig Dispense Refill   acetaminophen (TYLENOL) 500 MG tablet Take 500 mg by mouth as needed for mild pain.     amLODipine-benazepril (LOTREL) 5-20 MG capsule Take 1 capsule by mouth daily. For blood pressure. 90 capsule 1   b complex vitamins capsule Take 1 capsule by mouth daily.     brimonidine (ALPHAGAN) 0.2 % ophthalmic solution 1 drop 2 (two) times daily.     Calcium Carbonate-Vit D-Min (CALCIUM 1200 PO) Take by mouth.     cetirizine (ZYRTEC) 10 MG tablet Take 10 mg by mouth daily.     cholecalciferol (VITAMIN D) 1000 units tablet Take 1,000 Units by mouth daily.     Coenzyme Q10 (COQ10 PO) Take 300 mg by mouth daily.     dorzolamide-timolol (COSOPT) 22.3-6.8 MG/ML ophthalmic solution Place 1 drop into both eyes 2 (two) times daily.     FLUoxetine (PROZAC) 10 MG capsule TAKE 1 CAPSULE BY MOUTH EVERY DAY FOR ANXIETY 90 capsule 1   latanoprost (XALATAN) 0.005 % ophthalmic solution Place 1 drop into both eyes at bedtime.     MAGNESIUM PO Take by mouth.     Multiple Vitamin (MULTIVITAMIN) tablet Take 1 tablet by mouth daily.     Olopatadine HCl (PATADAY OP) Apply to eye.     Propylene Glycol (SYSTANE BALANCE OP) Apply to eye.     rosuvastatin (CRESTOR) 5 MG tablet Take 1 tablet (5 mg total) by mouth 3 (three) times a week. 30 tablet 6   traZODone (DESYREL) 50 MG tablet Take 1 tablet (50 mg total) by mouth at bedtime as needed for sleep. 90 tablet 1   trolamine salicylate (ASPERCREME) 10 % cream Apply 1 application topically as needed for muscle pain.     No current facility-administered  medications on file prior to visit.    BP 120/76    Pulse 64    Temp 97.8 F (36.6 C) (Temporal)    Ht 5' 5.5" (1.664 m)    Wt 188 lb (85.3 kg)    SpO2 96%    BMI 30.81  kg/m  Objective:   Physical Exam Cardiovascular:     Rate and Rhythm: Normal rate and regular rhythm.  Pulmonary:     Effort: Pulmonary effort is normal.     Breath sounds: Normal breath sounds.  Musculoskeletal:     Cervical back: Neck supple.  Skin:    General: Skin is warm and dry.  Neurological:     Mental Status: She is alert and oriented to person, place, and time.  Psychiatric:        Mood and Affect: Mood normal.          Assessment & Plan:      This visit occurred during the SARS-CoV-2 public health emergency.  Safety protocols were in place, including screening questions prior to the visit, additional usage of staff PPE, and extensive cleaning of exam room while observing appropriate contact time as indicated for disinfecting solutions.

## 2021-08-23 ENCOUNTER — Other Ambulatory Visit (INDEPENDENT_AMBULATORY_CARE_PROVIDER_SITE_OTHER): Payer: Medicare Other

## 2021-08-23 DIAGNOSIS — E0500 Thyrotoxicosis with diffuse goiter without thyrotoxic crisis or storm: Secondary | ICD-10-CM | POA: Diagnosis not present

## 2021-08-23 LAB — T4, FREE: Free T4: 0.73 ng/dL (ref 0.60–1.60)

## 2021-09-11 ENCOUNTER — Other Ambulatory Visit: Payer: Self-pay

## 2021-09-11 ENCOUNTER — Ambulatory Visit (INDEPENDENT_AMBULATORY_CARE_PROVIDER_SITE_OTHER): Payer: Medicare Other | Admitting: Pharmacist

## 2021-09-11 ENCOUNTER — Telehealth: Payer: Self-pay | Admitting: Pharmacist

## 2021-09-11 VITALS — BP 112/74 | HR 61 | Resp 17 | Ht 65.5 in | Wt 192.8 lb

## 2021-09-11 DIAGNOSIS — E782 Mixed hyperlipidemia: Secondary | ICD-10-CM

## 2021-09-11 DIAGNOSIS — E785 Hyperlipidemia, unspecified: Secondary | ICD-10-CM

## 2021-09-11 NOTE — Telephone Encounter (Signed)
Please complete prior authorization for:  Name of medication, dose, and frequency repatha 140 sq q 14 days or Praluent 75mg  sq q 14 days  Lab Orders Requested? yes  Which labs? Lipid panel  Estimated date for labs to be scheduled 2-3 months  Does patient need activated copay card? Yes, has [place] plan

## 2021-09-11 NOTE — Telephone Encounter (Signed)
Accidentally copied wrong key into pts chart the correct key for the cmm pa sent is  [employer]ker[employer] (Key: [id number])

## 2021-09-11 NOTE — Telephone Encounter (Signed)
Pt has 2 insurances trying for p75 first since that one could be covered w/copay card.  [employer]OW[employer]ING (Key: [id number])  Will call the pharmacy first once approved to make sure they are running the right insurance  Copay card is as follows:   Lipid panel ordered and released

## 2021-09-11 NOTE — Progress Notes (Unsigned)
Patient ID: Marie Ruiz                 DOB: May 06, 1946                    MRN: [id number]     HPI: Marie Ruiz is a 76 y.o. female patient referred to lipid clinic by Dr Margaretann Loveless. PMH is significant for HTN, DVT, Graves Diseases, aortic atherosclerosis and HLD.   Patient presents today in good spirits. Has had myalgias to atorvastatin and rosuvastatin. Currently managed on rosuvastatin 5mg  3x weekly. Coronary CT showed no calcium. However patient does have ascending aortic aneurysm.  Husband had a heart block pacemaker implanted and is home recovering.  Since his procedure they have had to adjust their diets. They have reduced fried foods and are eating more vegetables, fruits, and lean proteins. Drinks alcohol socially.  Was going to [employer] with her husband but has not gone since he became sick.  Plans on starting again without him.  Reports a family history of CAD, father had MI.  Reports sister has elevated cholesterol and statin intolerance also.  Current Medications:  Rosuvastatin 5 mg 3x weekly  Intolerances:  Atorvastatin Rosuvastatin at doses >5mg  3x weekly  Risk Factors:  Family history HTN HLD  LDL goal: <70  Labs: TC 214, HDL 62, Trigs 138, LDL 128 (06/29/21)  Coronary CT Results 1. No evidence of CAD, CADRADS = 0.   2. Coronary calcium score of 0.  Past Medical History:  Diagnosis Date   Allergy    seasonal   Anxiety    Aortic aneurysm ([place]) 08/2018   Arthritis    Blood clotting disorder (HCC)    Bone spur    with excision   Chickenpox    Colon polyps    DVT (deep venous thrombosis) (HCC)    Glaucoma    both   Graves disease 03/30/2013   History of blood clots 10/2017   Hyperlipemia    Hypertension    Osteopenia    Prediabetes    Restless leg syndrome     Current Outpatient Medications on File Prior to Visit  Medication Sig Dispense Refill   acetaminophen (TYLENOL) 500 MG tablet Take 500 mg by mouth as needed for mild  pain.     amLODipine-benazepril (LOTREL) 5-20 MG capsule Take 1 capsule by mouth daily. For blood pressure. 90 capsule 1   b complex vitamins capsule Take 1 capsule by mouth daily.     brimonidine (ALPHAGAN) 0.2 % ophthalmic solution 1 drop 2 (two) times daily.     Calcium Carbonate-Vit D-Min (CALCIUM 1200 PO) Take by mouth.     cetirizine (ZYRTEC) 10 MG tablet Take 10 mg by mouth daily.     cholecalciferol (VITAMIN D) 1000 units tablet Take 1,000 Units by mouth daily.     Coenzyme Q10 (COQ10 PO) Take 300 mg by mouth daily.     dorzolamide-timolol (COSOPT) 22.3-6.8 MG/ML ophthalmic solution Place 1 drop into both eyes 2 (two) times daily.     FLUoxetine (PROZAC) 10 MG capsule TAKE 1 CAPSULE BY MOUTH EVERY DAY FOR ANXIETY 90 capsule 1   latanoprost (XALATAN) 0.005 % ophthalmic solution Place 1 drop into both eyes at bedtime.     MAGNESIUM PO Take by mouth.     Multiple Vitamin (MULTIVITAMIN) tablet Take 1 tablet by mouth daily.     Olopatadine HCl (PATADAY OP) Apply to eye.     Propylene Glycol (SYSTANE BALANCE OP)  Apply to eye.     rosuvastatin (CRESTOR) 5 MG tablet Take 1 tablet (5 mg total) by mouth 3 (three) times a week. 30 tablet 6   traZODone (DESYREL) 50 MG tablet Take 1 tablet (50 mg total) by mouth at bedtime as needed for sleep. 90 tablet 1   trolamine salicylate (ASPERCREME) 10 % cream Apply 1 application topically as needed for muscle pain.     No current facility-administered medications on file prior to visit.    Allergies  Allergen Reactions   Amitriptyline Other (See Comments)    hallucinations   Codeine    Prednisone     Depression    Assessment/Plan:  1. Hyperlipidemia - Patient LDL 128 which is above goal of <70. Could feasibly try for <100 but will try to be slightly more aggressive due to family history.  Since patient is intolerant to statins, recommend PCSK9i.  Using [employer], educated patient on mechanism of action, storage, site selecton,  administration, and possible adverse effects.  Patient was able to demonstrate in room. Advised patient can d/c low dose rosuvastatin and co Q 10 due to myalgias once she starts Praluent. Recheck lipid panel in 2-3 months.  Recommend patient continue heart healthy diet. Continue reducing salts and sugars and concentrate on vegetables, fruits, lean proteins, and whole grains. Recommended increasing physical activity up to 30 minutes a day at least 5 days a week. Recheck as needed.  Karren Cobble, PharmD, BCACP, [place], [place], [place], [place], [address] Phone: [phone], Fax: [phone]

## 2021-09-11 NOTE — Patient Instructions (Signed)
It was nice meeting you today  We would like your LDL (bad cholesterol) to be less than 70  We will start a new medication you will inject once every 2 weeks (either Repatha or Praluent)  We will complete the prior authorization for you and activate a copay card  Once you start the medication we will recheck your cholesterol in 2-3 months  You can then discontinue your rosuvastatin and co q 10  Please call with any questions!  Karren Cobble, PharmD, BCACP, [place], [place], [place], [place], [address] Phone: [phone], Fax: [phone]

## 2021-09-12 MED ORDER — PRALUENT 75 MG/ML ~~LOC~~ SOAJ: 75.0000 mg | SUBCUTANEOUS | 11 refills | Status: DC

## 2021-09-12 NOTE — Telephone Encounter (Signed)
Called and spoke w/pharmacy and they were able to get the copay card and they stated that the cost came down to $35

## 2021-09-12 NOTE — Addendum Note (Signed)
Addended by: Allean Found on: 09/12/2021 08:13 AM   Modules accepted: Orders

## 2021-09-12 NOTE — Telephone Encounter (Signed)
Called and lmom pt that they were approved for praluent, cost $35 after copay and to complete fasting labs. I also stated that if they have further questions they may reach out to me.

## 2021-09-27 ENCOUNTER — Other Ambulatory Visit: Payer: Self-pay | Admitting: Primary Care

## 2021-09-27 DIAGNOSIS — F411 Generalized anxiety disorder: Secondary | ICD-10-CM

## 2021-10-04 ENCOUNTER — Other Ambulatory Visit: Payer: Self-pay | Admitting: Primary Care

## 2021-10-24 ENCOUNTER — Ambulatory Visit (INDEPENDENT_AMBULATORY_CARE_PROVIDER_SITE_OTHER): Payer: Medicare Other

## 2021-10-24 ENCOUNTER — Ambulatory Visit
Admission: RE | Admit: 2021-10-24 | Discharge: 2021-10-24 | Disposition: A | Discharge status: Home / Self Care | Payer: Medicare Other | Source: Ambulatory Visit | Attending: Primary Care | Admitting: Primary Care

## 2021-10-24 VITALS — Ht 65.0 in | Wt 192.0 lb

## 2021-10-24 DIAGNOSIS — Z0000 Encounter for general adult medical examination without abnormal findings: Secondary | ICD-10-CM | POA: Diagnosis not present

## 2021-10-24 DIAGNOSIS — Z1231 Encounter for screening mammogram for malignant neoplasm of breast: Secondary | ICD-10-CM

## 2021-10-24 NOTE — Progress Notes (Signed)
? ?Subjective:  ? Marie Ruiz is a 76 y.o. female who presents for Medicare Annual (Subsequent) preventive examination. ? ?I connected with Cordie Grice today by telephone and verified that I am speaking with the correct person using two identifiers. ?Location patient: home ?Location provider: work ?Persons participating in the virtual visit: patient, nurse.  ?  ?I discussed the limitations, risks, security and privacy concerns of performing an evaluation and management service by telephone and the availability of in person appointments. I also discussed with the patient that there may be a patient responsible charge related to this service. The patient expressed understanding and verbally consented to this telephonic visit.  ?  ?Interactive audio and video telecommunications were attempted between this provider and patient, however failed, due to patient having technical difficulties OR patient did not have access to video capability.  We continued and completed visit with audio only. ? ?Some vital signs may be absent or patient reported.  ? ?Time Spent with patient on telephone encounter: 20 minutes ? ?Review of Systems    ? ?Cardiac Risk Factors include: advanced age (>4mn, >>26women);hypertension;dyslipidemia ? ?   ?Objective:  ?  ?Today's Vitals  ? 10/24/21 1103  ?Weight: 192 lb (87.1 kg)  ?Height: 5' 5"  (1.651 m)  ? ?Body mass index is 31.95 kg/m?. ? ? ?  10/24/2021  ? 11:08 AM 02/10/2020  ?  3:43 PM 12/30/2018  ?  5:20 PM 06/05/2018  ?  8:30 AM 05/20/2018  ? 11:20 AM  ?Advanced Directives  ?Does Patient Have a Medical Advance Directive? Yes Yes Yes No No  ?Type of AParamedicof AThayerLiving will HMariettaLiving will HHalseyLiving will    ?Does patient want to make changes to medical advance directive? Yes (MAU/Ambulatory/Procedural Areas - Information given)      ?Copy of HViolain Chart?  No - copy  requested No - copy requested    ?Would patient like information on creating a medical advance directive?    No - Patient declined No - Patient declined  ? ? ?Current Medications (verified) ?Outpatient Encounter Medications as of 10/24/2021  ?Medication Sig  ? acetaminophen (TYLENOL) 500 MG tablet Take 500 mg by mouth as needed for mild pain.  ? Alirocumab (PRALUENT) 75 MG/ML SOAJ Inject 75 mg into the skin every 14 (fourteen) days.  ? amLODipine-benazepril (LOTREL) 5-20 MG capsule Take 1 capsule by mouth daily. For blood pressure.  ? b complex vitamins capsule Take 1 capsule by mouth daily.  ? brimonidine (ALPHAGAN) 0.2 % ophthalmic solution 1 drop 2 (two) times daily.  ? Calcium Carbonate-Vit D-Min (CALCIUM 1200 PO) Take by mouth.  ? cetirizine (ZYRTEC) 10 MG tablet Take 10 mg by mouth daily.  ? cholecalciferol (VITAMIN D) 1000 units tablet Take 1,000 Units by mouth daily.  ? Coenzyme Q10 (COQ10 PO) Take 300 mg by mouth daily.  ? dorzolamide-timolol (COSOPT) 22.3-6.8 MG/ML ophthalmic solution Place 1 drop into both eyes 2 (two) times daily.  ? FLUoxetine (PROZAC) 10 MG capsule TAKE 1 CAPSULE BY MOUTH EVERY DAY FOR ANXIETY  ? latanoprost (XALATAN) 0.005 % ophthalmic solution Place 1 drop into both eyes at bedtime.  ? MAGNESIUM PO Take by mouth.  ? Multiple Vitamin (MULTIVITAMIN) tablet Take 1 tablet by mouth daily.  ? Olopatadine HCl (PATADAY OP) Apply to eye.  ? Propylene Glycol (SYSTANE BALANCE OP) Apply to eye.  ? rosuvastatin (CRESTOR) 5 MG tablet Take 1 tablet (5  mg total) by mouth 3 (three) times a week.  ? trolamine salicylate (ASPERCREME) 10 % cream Apply 1 application topically as needed for muscle pain.  ? traZODone (DESYREL) 50 MG tablet Take 1 tablet (50 mg total) by mouth at bedtime as needed for sleep. (Patient not taking: Reported on 10/24/2021)  ? ?No facility-administered encounter medications on file as of 10/24/2021.  ? ? ?Allergies (verified) ?Amitriptyline, Codeine, Crestor [rosuvastatin], Lipitor  [atorvastatin], and Prednisone  ? ?History: ?Past Medical History:  ?Diagnosis Date  ? Allergy   ? seasonal  ? Anxiety   ? Aortic aneurysm ([place]) 08/2018  ? Arthritis   ? Blood clotting disorder ([place])   ? Bone spur   ? with excision  ? Chickenpox   ? Colon polyps   ? DVT (deep venous thrombosis) ([place])   ? Glaucoma   ? both  ? Graves disease 03/30/2013  ? History of blood clots 10/2017  ? Hyperlipemia   ? Hypertension   ? Osteopenia   ? Prediabetes   ? Restless leg syndrome   ? ?Past Surgical History:  ?Procedure Laterality Date  ? BREAST CYST ASPIRATION    ? CATARACT EXTRACTION W/ INTRAOCULAR LENS IMPLANT Right 2015  ? CATARACT EXTRACTION W/PHACO Left 2017  ? COLONOSCOPY  2014  ? DILATION AND CURETTAGE OF UTERUS    ? ?Family History  ?Problem Relation Age of Onset  ? Heart disease Mother   ? Heart attack Father   ? Breast cancer Sister 64  ? Colon polyps Sister   ? Aneurysm Brother   ? Multiple myeloma Sister   ?     found in stage 4  ? Breast cancer Maternal Grandmother   ? Esophageal cancer Maternal Aunt 82  ? Colon cancer Neg Hx   ? Rectal cancer Neg Hx   ? Stomach cancer Neg Hx   ? ?Social History  ? ?Socioeconomic History  ? Marital status: Married  ?  Spouse name: Not on file  ? Number of children: Not on file  ? Years of education: Not on file  ? Highest education level: Not on file  ?Occupational History  ? Occupation: Retired  ?Tobacco Use  ? Smoking status: Former  ?  Types: Cigarettes  ?  Quit date: 55  ?  Years since quitting: 33.2  ? Smokeless tobacco: Never  ?Vaping Use  ? Vaping Use: Never used  ?Substance and Sexual Activity  ? Alcohol use: Yes  ?  Alcohol/week: 2.0 standard drinks  ?  Types: 2 Standard drinks or equivalent per week  ? Drug use: Not Currently  ? Sexual activity: Not Currently  ?Other Topics Concern  ? Not on file  ?Social History Narrative  ? Lives in [place], moving to [place].  ? Retired.  ? Married.   ? ?Social Determinants of Health  ? ?Financial Resource Strain: Low Risk   ?  Difficulty of Paying Living Expenses: Not hard at all  ?Food Insecurity: No Food Insecurity  ? Worried About Charity fundraiser in the Last Year: Never true  ? Ran Out of Food in the Last Year: Never true  ?Transportation Needs: No Transportation Needs  ? Lack of Transportation (Medical): No  ? Lack of Transportation (Non-Medical): No  ?Physical Activity: Insufficiently Active  ? Days of Exercise per Week: 2 days  ? Minutes of Exercise per Session: 30 min  ?Stress: No Stress Concern Present  ? Feeling of Stress : Only a little  ?Social Connections: Moderately Integrated  ?  Frequency of Communication with Friends and Family: More than three times a week  ? Frequency of Social Gatherings with Friends and Family: More than three times a week  ? Attends Religious Services: More than 4 times per year  ? Active Member of Clubs or Organizations: No  ? Attends Archivist Meetings: Never  ? Marital Status: Married  ? ? ?Tobacco Counseling ?Counseling given: Not Answered ? ? ?Clinical Intake: ? ?Pre-visit preparation completed: Yes ? ?Pain : No/denies pain ? ?  ? ?BMI - recorded: 31.95 ?Nutritional Status: BMI > 30  Obese ?Nutritional Risks: None ?Diabetes: No ? ?How often do you need to have someone help you when you read instructions, pamphlets, or other written materials from your doctor or pharmacy?: 1 - Never ? ?Diabetic? No ? ?Interpreter Needed?: No ? ?Information entered by :: Orrin Brigham LPN ? ? ?Activities of Daily Living ? ?  10/24/2021  ? 11:10 AM 02/14/2021  ?  2:10 PM  ?In your present state of health, do you have any difficulty performing the following activities:  ?Hearing? 1 0  ?Vision? 0 0  ?Difficulty concentrating or making decisions? 0 0  ?Walking or climbing stairs? 0 0  ?Dressing or bathing? 0 0  ?Doing errands, shopping? 0 0  ?Preparing Food and eating ? N   ?Using the Toilet? N   ?In the past six months, have you accidently leaked urine? N   ?Do you have problems with loss of bowel control?  N   ?Managing your Medications? N   ?Managing your Finances? N   ?Housekeeping or managing your Housekeeping? N   ? ? ?Patient Care Team: ?Pleas [name], NP as PCP - General (Internal Medicine) ?Cherlynn Kaiser

## 2021-10-24 NOTE — Patient Instructions (Signed)
Marie Ruiz , ?Thank you for taking time to complete your Medicare Wellness Visit. I appreciate your ongoing commitment to your health goals. Please review the following plan we discussed and let me know if I can assist you in the future.  ? ?Screening recommendations/referrals: ?Colonoscopy: up to date, completed 01/26/20, due 01/25/30 ?Mammogram: up to date, due 10/25/22 ?Bone Density: up to date, due 12/20/22 ?ophthalmology/optometry visit: per our conversation you have an upcoming appointment ?Recommended yearly dental visit for hygiene and checkup ? ?Vaccinations: ?Influenza vaccine: up date ?Pneumococcal vaccine: up to date  ?Tdap vaccine: up to date, due 06/19/26 ?Shingles vaccine: up to date   ?Covid-19:newest booster available at local pharmacy  ? ?Advanced directives: Please bring a copy of Living Will and/or Healthcare Power of Attorney for your chart.  ? ?Conditions/risks identified: see problem list  ? ?Next appointment: Follow up in one year for your annual wellness visit  ? ? ?Preventive Care 26 Years and Older, Female ?Preventive care refers to lifestyle choices and visits with your health care provider that can promote health and wellness. ?What does preventive care include? ?A yearly physical exam. This is also called an annual well check. ?Dental exams once or twice a year. ?Routine eye exams. Ask your health care provider how often you should have your eyes checked. ?Personal lifestyle choices, including: ?Daily care of your teeth and gums. ?Regular physical activity. ?Eating a healthy diet. ?Avoiding tobacco and drug use. ?Limiting alcohol use. ?Practicing safe sex. ?Taking low-dose aspirin every day. ?Taking vitamin and mineral supplements as recommended by your health care provider. ?What happens during an annual well check? ?The services and screenings done by your health care provider during your annual well check will depend on your age, overall health, lifestyle risk factors, and family history  of disease. ?Counseling  ?Your health care provider may ask you questions about your: ?Alcohol use. ?Tobacco use. ?Drug use. ?Emotional well-being. ?Home and relationship well-being. ?Sexual activity. ?Eating habits. ?History of falls. ?Memory and ability to understand (cognition). ?Work and work Statistician. ?Reproductive health. ?Screening  ?You may have the following tests or measurements: ?Height, weight, and BMI. ?Blood pressure. ?Lipid and cholesterol levels. These may be checked every 5 years, or more frequently if you are over 41 years old. ?Skin check. ?Lung cancer screening. You may have this screening every year starting at age 52 if you have a 30-pack-year history of smoking and currently smoke or have quit within the past 15 years. ?Fecal occult blood test (FOBT) of the stool. You may have this test every year starting at age 5. ?Flexible sigmoidoscopy or colonoscopy. You may have a sigmoidoscopy every 5 years or a colonoscopy every 10 years starting at age 68. ?Hepatitis C blood test. ?Hepatitis B blood test. ?Sexually transmitted disease (STD) testing. ?Diabetes screening. This is done by checking your blood sugar (glucose) after you have not eaten for a while (fasting). You may have this done every 1-3 years. ?Bone density scan. This is done to screen for osteoporosis. You may have this done starting at age 68. ?Mammogram. This may be done every 1-2 years. Talk to your health care provider about how often you should have regular mammograms. ?Talk with your health care provider about your test results, treatment options, and if necessary, the need for more tests. ?Vaccines  ?Your health care provider may recommend certain vaccines, such as: ?Influenza vaccine. This is recommended every year. ?Tetanus, diphtheria, and acellular pertussis (Tdap, Td) vaccine. You may need a Td  booster every 10 years. ?Zoster vaccine. You may need this after age 63. ?Pneumococcal 13-valent conjugate (PCV13) vaccine. One  dose is recommended after age 59. ?Pneumococcal polysaccharide (PPSV23) vaccine. One dose is recommended after age 21. ?Talk to your health care provider about which screenings and vaccines you need and how often you need them. ?This information is not intended to replace advice given to you by your health care provider. Make sure you discuss any questions you have with your health care provider. ?Document Released: 08/04/2015 Document Revised: 03/27/2016 Document Reviewed: 05/09/2015 ?Elsevier Interactive Patient Education ? 2017 [employer]. ? ?Fall Prevention in the Home ?Falls can cause injuries. They can happen to people of all ages. There are many things you can do to make your home safe and to help prevent falls. ?What can I do on the outside of my home? ?Regularly fix the edges of walkways and driveways and fix any cracks. ?Remove anything that might make you trip as you walk through a door, such as a raised step or threshold. ?Trim any bushes or trees on the path to your home. ?Use bright outdoor lighting. ?Clear any walking paths of anything that might make someone trip, such as rocks or tools. ?Regularly check to see if handrails are loose or broken. Make sure that both sides of any steps have handrails. ?Any raised decks and porches should have guardrails on the edges. ?Have any leaves, snow, or ice cleared regularly. ?Use sand or salt on walking paths during winter. ?Clean up any spills in your garage right away. This includes oil or grease spills. ?What can I do in the bathroom? ?Use night lights. ?Install grab bars by the toilet and in the tub and shower. Do not use towel bars as grab bars. ?Use non-skid mats or decals in the tub or shower. ?If you need to sit down in the shower, use a plastic, non-slip stool. ?Keep the floor dry. Clean up any water that spills on the floor as soon as it happens. ?Remove soap buildup in the tub or shower regularly. ?Attach bath mats securely with double-sided  non-slip rug tape. ?Do not have throw rugs and other things on the floor that can make you trip. ?What can I do in the bedroom? ?Use night lights. ?Make sure that you have a light by your bed that is easy to reach. ?Do not use any sheets or blankets that are too big for your bed. They should not hang down onto the floor. ?Have a firm chair that has side arms. You can use this for support while you get dressed. ?Do not have throw rugs and other things on the floor that can make you trip. ?What can I do in the kitchen? ?Clean up any spills right away. ?Avoid walking on wet floors. ?Keep items that you use a lot in easy-to-reach places. ?If you need to reach something above you, use a strong step stool that has a grab bar. ?Keep electrical cords out of the way. ?Do not use floor polish or wax that makes floors slippery. If you must use wax, use non-skid floor wax. ?Do not have throw rugs and other things on the floor that can make you trip. ?What can I do with my stairs? ?Do not leave any items on the stairs. ?Make sure that there are handrails on both sides of the stairs and use them. Fix handrails that are broken or loose. Make sure that handrails are as long as the stairways. ?Check any carpeting  to make sure that it is firmly attached to the stairs. Fix any carpet that is loose or worn. ?Avoid having throw rugs at the top or bottom of the stairs. If you do have throw rugs, attach them to the floor with carpet tape. ?Make sure that you have a light switch at the top of the stairs and the bottom of the stairs. If you do not have them, ask someone to add them for you. ?What else can I do to help prevent falls? ?Wear shoes that: ?Do not have high heels. ?Have rubber bottoms. ?Are comfortable and fit you well. ?Are closed at the toe. Do not wear sandals. ?If you use a stepladder: ?Make sure that it is fully opened. Do not climb a closed stepladder. ?Make sure that both sides of the stepladder are locked into place. ?Ask  someone to hold it for you, if possible. ?Clearly mark and make sure that you can see: ?Any grab bars or handrails. ?First and last steps. ?Where the edge of each step is. ?Use tools that help you move aro

## 2021-11-16 DIAGNOSIS — E785 Hyperlipidemia, unspecified: Secondary | ICD-10-CM | POA: Diagnosis not present

## 2021-11-16 DIAGNOSIS — H401133 Primary open-angle glaucoma, bilateral, severe stage: Secondary | ICD-10-CM | POA: Diagnosis not present

## 2021-11-16 LAB — LIPID PANEL
Chol/HDL Ratio: 2.9 ratio (ref 0.0–4.4)
Cholesterol, Total: 227 mg/dL — ABNORMAL HIGH (ref 100–199)
HDL: 77 mg/dL (ref >39)
LDL Chol Calc (NIH): 119 mg/dL — ABNORMAL HIGH (ref 0–99)
Triglycerides: 180 mg/dL — ABNORMAL HIGH (ref 0–149)
VLDL Cholesterol Cal: 31 mg/dL (ref 5–40)

## 2021-11-26 ENCOUNTER — Encounter (HOSPITAL_COMMUNITY): Payer: Self-pay

## 2021-11-26 ENCOUNTER — Encounter: Payer: Self-pay | Admitting: Pharmacist

## 2021-11-26 ENCOUNTER — Ambulatory Visit (HOSPITAL_COMMUNITY)
Admission: RE | Admit: 2021-11-26 | Discharge: 2021-11-26 | Disposition: A | Discharge status: Home / Self Care | Payer: Medicare Other | Source: Ambulatory Visit | Attending: Emergency Medicine | Admitting: Emergency Medicine

## 2021-11-26 VITALS — BP 128/85 | HR 67 | Temp 98.3°F | Resp 17

## 2021-11-26 DIAGNOSIS — R102 Pelvic and perineal pain: Secondary | ICD-10-CM | POA: Diagnosis present

## 2021-11-26 DIAGNOSIS — N3001 Acute cystitis with hematuria: Secondary | ICD-10-CM | POA: Diagnosis not present

## 2021-11-26 LAB — POCT URINALYSIS DIPSTICK, ED / UC
Bilirubin Urine: NEGATIVE
Glucose, UA: NEGATIVE mg/dL
Ketones, ur: NEGATIVE mg/dL
Nitrite: NEGATIVE
Protein, ur: NEGATIVE mg/dL
Specific Gravity, Urine: <=1.005 (ref 1.005–1.030)
Urobilinogen, UA: 0.2 mg/dL (ref 0.0–1.0)
pH: 6.5 (ref 5.0–8.0)

## 2021-11-26 MED ORDER — SULFAMETHOXAZOLE-TRIMETHOPRIM 800-160 MG PO TABS: 1.0000 | ORAL_TABLET | Freq: Two times a day (BID) | ORAL | 0 refills | Status: AC

## 2021-11-26 NOTE — ED Triage Notes (Signed)
Pt presents c/o vaginal pressure when she walks and sitting. Pt states of having yeast infection back in Oct. 2022.   ?

## 2021-11-26 NOTE — ED Provider Notes (Signed)
?[facility] ? ? ? ?CSN: [id number] ?Arrival date & time: 11/26/21  1810 ? ? ?  ? ?History   ?Chief Complaint ?Chief Complaint  ?Patient presents with  ? APPOINTMENT: Vaginal Pain   ? ? ?HPI ?Marie Ruiz is a 76 y.o. female.  ? ?Patient presents with concerns of feeling vaginal pressure since Saturday. She reports she notices it some sitting, but mainly when walking and it seems to worsen the longer she is up. The patient reports some burning with urination but denies frequency, urgency, incontinence, or hematuria. She denies abdominal or back pain, nausea/vomiting, or fever. She denies discharge or vaginal bleeding. The patient denies prior similar. She states she hasn't seen her gynecologist in "a quite while".  ? ?The history is provided by the patient.  ? ?Past Medical History:  ?Diagnosis Date  ? Allergy   ? seasonal  ? Anxiety   ? Aortic aneurysm ([place]) 08/2018  ? Arthritis   ? Blood clotting disorder ([place])   ? Bone spur   ? with excision  ? Chickenpox   ? Colon polyps   ? DVT (deep venous thrombosis) ([place])   ? Glaucoma   ? both  ? Graves disease 03/30/2013  ? History of blood clots 10/2017  ? Hyperlipemia   ? Hypertension   ? Osteopenia   ? Prediabetes   ? Restless leg syndrome   ? ? ?Patient Active Problem List  ? Diagnosis Date Noted  ? Weight loss 08/22/2021  ? Preventative health care 02/14/2021  ? Heel pain, bilateral 08/14/2020  ? Ascending aortic aneurysm ([place]) 02/10/2020  ? Insomnia 11/22/2019  ? Osteopenia 02/01/2019  ? Sensation of fullness in right ear 12/22/2018  ? Multiple nevi 12/22/2018  ? GAD (generalized anxiety disorder) 12/23/2017  ? Essential hypertension 12/23/2017  ? DVT (deep venous thrombosis) ([place]) 12/23/2017  ? Hyperlipidemia 12/23/2017  ? Graves disease 12/23/2017  ? Glaucoma 12/23/2017  ? Osteoarthritis 12/23/2017  ? ? ?Past Surgical History:  ?Procedure Laterality Date  ? BREAST CYST ASPIRATION    ? CATARACT EXTRACTION W/ INTRAOCULAR LENS IMPLANT Right 2015  ?  CATARACT EXTRACTION W/PHACO Left 2017  ? COLONOSCOPY  2014  ? DILATION AND CURETTAGE OF UTERUS    ? ? ?OB History   ?No obstetric history on file. ?  ? ? ? ?Home Medications   ? ?Prior to Admission medications   ?Medication Sig Start Date End Date Taking? Authorizing Provider  ?sulfamethoxazole-trimethoprim (BACTRIM DS) 800-160 MG tablet Take 1 tablet by mouth 2 (two) times daily for 5 days. 11/26/21 12/01/21 Yes Lorre Opdahl L, PA  ?acetaminophen (TYLENOL) 500 MG tablet Take 500 mg by mouth as needed for mild pain.    [provider]  ?Alirocumab (PRALUENT) 75 MG/ML SOAJ Inject 75 mg into the skin every 14 (fourteen) days. 09/12/21   Elouise Munroe, MD  ?amLODipine-benazepril (LOTREL) 5-20 MG capsule Take 1 capsule by mouth daily. For blood pressure. 08/20/21   Pleas [name], NP  ?b complex vitamins capsule Take 1 capsule by mouth daily.    [provider]  ?brimonidine (ALPHAGAN) 0.2 % ophthalmic solution 1 drop 2 (two) times daily. 10/25/19   [provider]  ?Calcium Carbonate-Vit D-Min (CALCIUM 1200 PO) Take by mouth.    [provider]  ?cetirizine (ZYRTEC) 10 MG tablet Take 10 mg by mouth daily.    [provider]  ?cholecalciferol (VITAMIN D) 1000 units tablet Take 1,000 Units by mouth daily.    [provider]  ?Coenzyme Q10 (COQ10 PO) Take 300 mg by mouth daily.    [provider]  ?dorzolamide-timolol (COSOPT) 22.3-6.8 MG/ML ophthalmic solution Place 1 drop into both eyes 2 (two) times daily.    [provider]  ?FLUoxetine (PROZAC) 10 MG capsule TAKE 1 CAPSULE BY MOUTH EVERY DAY FOR ANXIETY 09/27/21   Pleas [name], NP  ?latanoprost (XALATAN) 0.005 % ophthalmic solution Place 1 drop into both eyes at bedtime.    [provider]  ?MAGNESIUM PO Take by mouth.    [provider]  ?Multiple Vitamin (MULTIVITAMIN) tablet Take 1 tablet by mouth daily.    [provider]  ?Olopatadine HCl (PATADAY OP) Apply to  eye.    [provider]  ?Propylene Glycol (SYSTANE BALANCE OP) Apply to eye.    [provider]  ?rosuvastatin (CRESTOR) 5 MG tablet Take 1 tablet (5 mg total) by mouth 3 (three) times a week. 07/25/21   Elouise Munroe, MD  ?traZODone (DESYREL) 50 MG tablet Take 1 tablet (50 mg total) by mouth at bedtime as needed for sleep. ?Patient not taking: Reported on 10/24/2021 11/22/19   Pleas [name], NP  ?trolamine salicylate (ASPERCREME) 10 % cream Apply 1 application topically as needed for muscle pain.    [provider]  ? ? ?Family History ?Family History  ?Problem Relation Age of Onset  ? Heart disease Mother   ? Heart attack Father   ? Breast cancer Sister 4  ? Colon polyps Sister   ? Aneurysm Brother   ? Multiple myeloma Sister   ?     found in stage 4  ? Breast cancer Maternal Grandmother   ? Esophageal cancer Maternal Aunt 82  ? Colon cancer Neg Hx   ? Rectal cancer Neg Hx   ? Stomach cancer Neg Hx   ? ? ?Social History ?Social History  ? ?Tobacco Use  ? Smoking status: Former  ?  Types: Cigarettes  ?  Quit date: 71  ?  Years since quitting: 33.3  ? Smokeless tobacco: Never  ?Vaping Use  ? Vaping Use: Never used  ?Substance Use Topics  ? Alcohol use: Yes  ?  Alcohol/week: 2.0 standard drinks  ?  Types: 2 Standard drinks or equivalent per week  ? Drug use: Not Currently  ? ? ? ?Allergies   ?Amitriptyline, Codeine, Crestor [rosuvastatin], Lipitor [atorvastatin], and Prednisone ? ? ?Review of Systems ?Review of Systems  ?Constitutional:  Negative for fatigue and fever.  ?Gastrointestinal:  Negative for abdominal pain, diarrhea, nausea and vomiting.  ?Genitourinary:  Positive for dysuria. Negative for frequency, menstrual problem, urgency, vaginal bleeding and vaginal discharge.  ?Musculoskeletal:  Negative for back pain.  ?Skin:  Negative for rash.  ? ? ?Physical Exam ?Triage Vital Signs ?ED Triage Vitals  ?Enc Vitals Group  ?   BP 11/26/21 1832 128/85  ?   Pulse Rate 11/26/21 1832  67  ?   Resp 11/26/21 1832 17  ?   Temp 11/26/21 1832 98.3 ?F (36.8 ?C)  ?   Temp Source 11/26/21 1832 Oral  ?   SpO2 11/26/21 1832 99 %  ?   Weight --   ?   Height --   ?   Head Circumference --   ?   Peak Flow --   ?   Pain Score 11/26/21 1834 0  ?   Pain Loc --   ?   Pain Edu? --   ?  Excl. in [place]? --   ? ?No data found. ? ?Updated Vital Signs ?BP 128/85 (BP Location: Right Arm)   Pulse 67   Temp 98.3 ?F (36.8 ?C) (Oral)   Resp 17   SpO2 99%  ? ?Visual Acuity ?Right Eye Distance:   ?Left Eye Distance:   ?Bilateral Distance:   ? ?Right Eye Near:   ?Left Eye Near:    ?Bilateral Near:    ? ?Physical Exam ?Vitals and nursing note reviewed.  ?Constitutional:   ?   General: She is not in acute distress. ?HENT:  ?   Head: Normocephalic.  ?Eyes:  ?   Pupils: Pupils are equal, round, and reactive to light.  ?Cardiovascular:  ?   Rate and Rhythm: Normal rate and regular rhythm.  ?   Heart sounds: Normal heart sounds.  ?Pulmonary:  ?   Effort: Pulmonary effort is normal.  ?   Breath sounds: Normal breath sounds.  ?Abdominal:  ?   Palpations: Abdomen is soft.  ?   Tenderness: There is no abdominal tenderness. There is no right CVA tenderness, left CVA tenderness, guarding or rebound.  ?Genitourinary: ?   General: Normal vulva.  ?   Comments: No discharge, rash, or erythema. Mild bulging visible in vagina with Valsalva maneuver. No tenderness.  ?Skin: ?   Findings: No rash.  ?Neurological:  ?   Mental Status: She is alert.  ?   Gait: Gait normal.  ?Psychiatric:     ?   Mood and Affect: Mood normal.  ? ? ? ?UC Treatments / Results  ?Labs ?(all labs ordered are listed, but only abnormal results are displayed) ?Labs Reviewed  ?POCT URINALYSIS DIPSTICK, ED / UC - Abnormal; Notable for the following components:  ?    Result Value  ? Hgb urine dipstick TRACE (*)   ? Leukocytes,Ua SMALL (*)   ? All other components within normal limits  ?URINE CULTURE  ?CERVICOVAGINAL ANCILLARY ONLY  ? ? ?EKG ? ? ?Radiology ?No results  found. ? ?Procedures ?Procedures (including critical care time) ? ?Medications Ordered in UC ?Medications - No data to display ? ?Initial Impression / Assessment and Plan / UC Course  ?I have reviewed the triage vital

## 2021-11-26 NOTE — Telephone Encounter (Signed)
Patient is following up. She also mentioned she thinks she has a UTI. Would like to know if our office is able to take a urine sample. Please advise. ?

## 2021-11-26 NOTE — Discharge Instructions (Addendum)
Take antibiotics as prescribed for UTI. We will contact you with urine culture and vaginal swab (testing for BV and yeast) results. ?Suspect you may have early vaginal prolapse causing the pressure, especially due to worsening with standing. Recommend follow-up with gynecologist for further evaluation and treatment options.  ?

## 2021-11-27 LAB — CERVICOVAGINAL ANCILLARY ONLY
Bacterial Vaginitis (gardnerella): NEGATIVE
Candida Glabrata: NEGATIVE
Candida Vaginitis: NEGATIVE
Comment: NEGATIVE
Comment: NEGATIVE
Comment: NEGATIVE

## 2021-11-28 ENCOUNTER — Telehealth: Payer: Self-pay | Admitting: Pharmacist

## 2021-11-28 DIAGNOSIS — E782 Mixed hyperlipidemia: Secondary | ICD-10-CM

## 2021-11-28 DIAGNOSIS — I700 Atherosclerosis of aorta: Secondary | ICD-10-CM

## 2021-11-28 LAB — URINE CULTURE

## 2021-11-28 MED ORDER — PRALUENT 150 MG/ML ~~LOC~~ SOAJ: 150.0000 mg | SUBCUTANEOUS | 1 refills | Status: DC

## 2021-11-28 NOTE — Telephone Encounter (Signed)
Spoke with patient. LDL did not decrease much on Praluent '75mg'$ .  Reports compliance althought she has not increased exercise much. Recommended increasing dose to '150mg'$  every 2 weeks and patient is agreeable. Will recheck lipid panel in 2-3 months. ?

## 2021-11-30 ENCOUNTER — Telehealth: Payer: Self-pay

## 2021-11-30 DIAGNOSIS — E782 Mixed hyperlipidemia: Secondary | ICD-10-CM

## 2021-11-30 DIAGNOSIS — I700 Atherosclerosis of aorta: Secondary | ICD-10-CM

## 2021-11-30 DIAGNOSIS — E785 Hyperlipidemia, unspecified: Secondary | ICD-10-CM

## 2021-11-30 NOTE — Telephone Encounter (Signed)
Please complete prior authorization for: ? ?Name of medication, dose, and frequency Praluent '150mg'$  sq q 14 days ? ?Lab Orders Requested? yes ? ?Which labs? Lipid panel ? ?Estimated date for labs to be scheduled 2-3 months ? ?Does patient need activated copay card? no  ?

## 2021-11-30 NOTE — Telephone Encounter (Signed)
Please call patient regarding a phone call she is receiving about her Praluent.   ?

## 2021-12-03 NOTE — Telephone Encounter (Signed)
Lmom for pt to call me back. 

## 2021-12-03 NOTE — Telephone Encounter (Signed)
Lipid panel ordered and released ?

## 2021-12-04 NOTE — Telephone Encounter (Signed)
Appeals faxed 12/03/21 ?

## 2021-12-18 MED ORDER — PRALUENT 150 MG/ML ~~LOC~~ SOAJ: 150.0000 mg | SUBCUTANEOUS | 1 refills | Status: DC

## 2021-12-18 NOTE — Addendum Note (Signed)
Addended by: Rollen Sox on: 12/18/2021 08:25 AM   Modules accepted: Orders

## 2021-12-18 NOTE — Telephone Encounter (Signed)
Praluent appeal successful.  PA approved through 06/09/22

## 2021-12-25 NOTE — Telephone Encounter (Signed)
You need to see her in office right?

## 2021-12-25 NOTE — Telephone Encounter (Signed)
Duplicate message. 

## 2021-12-26 ENCOUNTER — Other Ambulatory Visit: Payer: Self-pay | Admitting: Primary Care

## 2021-12-26 DIAGNOSIS — F411 Generalized anxiety disorder: Secondary | ICD-10-CM

## 2021-12-26 NOTE — Telephone Encounter (Signed)
Yes, please offer her an appointment with [place] to start.

## 2021-12-27 ENCOUNTER — Ambulatory Visit (INDEPENDENT_AMBULATORY_CARE_PROVIDER_SITE_OTHER): Payer: Medicare Other | Admitting: Family Medicine

## 2021-12-27 ENCOUNTER — Encounter: Payer: Self-pay | Admitting: Family Medicine

## 2021-12-27 VITALS — BP 126/70 | HR 62 | Temp 98.5°F | Resp 16 | Ht 65.5 in | Wt 199.0 lb

## 2021-12-27 DIAGNOSIS — N390 Urinary tract infection, site not specified: Secondary | ICD-10-CM

## 2021-12-27 DIAGNOSIS — N949 Unspecified condition associated with female genital organs and menstrual cycle: Secondary | ICD-10-CM | POA: Insufficient documentation

## 2021-12-27 LAB — POCT UA - MICROSCOPIC ONLY

## 2021-12-27 LAB — POC URINALSYSI DIPSTICK (AUTOMATED)
Bilirubin, UA: NEGATIVE
Blood, UA: POSITIVE
Glucose, UA: NEGATIVE
Ketones, UA: NEGATIVE
Leukocytes, UA: NEGATIVE
Nitrite, UA: NEGATIVE
Protein, UA: NEGATIVE
Spec Grav, UA: 1.01 (ref 1.010–1.025)
Urobilinogen, UA: 0.2 E.U./dL
pH, UA: 6 (ref 5.0–8.0)

## 2021-12-27 NOTE — Assessment & Plan Note (Signed)
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  We will send wet prep out to rule out BV and Candida.  Discussed initiation of vaginal lubrication with coconut oil, olive oil or over-the-counter lubrication.  She will consider estrogen vaginal cream.

## 2021-12-27 NOTE — Assessment & Plan Note (Signed)
Urinalysis and micro in office were clear.  No current urinary tract infection.  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  We discussed that estrogen vaginal cream should decrease the symptoms as well.

## 2021-12-27 NOTE — Progress Notes (Signed)
Patient ID: Marie Ruiz, female    DOB: July 23, 1945, 76 y.o.   MRN: [id number]  This visit was conducted in person.  BP 126/70   Pulse 62   Temp 98.5 F (36.9 C)   Resp 16   Ht 5' 5.5" (1.664 m)   Wt 199 lb (90.3 kg)   SpO2 99%   BMI 32.61 kg/m    CC:  Chief Complaint  Patient presents with   Vaginal Pain    X 4 days Hurt to walk    Vaginal Itching    Subjective:   HPI: Marie Ruiz is a 76 y.o. female  patient of [name], NP  with history of HTN presenting on 12/27/2021 for Vaginal Pain (X 4 days Hurt to walk ) and Vaginal Itching   She reports   intermittent issues off on on since Dec 2022.  Vaginal pain sharp in inside of vagina.   Does have intermittent itching in vaginal area, internally.  No discharge.  No diarrhea, no constipation.  Notes pain most with walking.  Some vaginal dryness.     No abdominal pain.  No dysuria, no change in frequency.. treated for UTI 11/26/21 reviewed OV note.  No blood in urine... symptoms resolved with antibiotics Bactrim DS x 5 days.  Urine culture: multiple species. She states she has been to urgent care for UTI several itme since husband has been ill.    Relevant past medical, surgical, family and social history reviewed and updated as indicated. Interim medical history since our last visit reviewed. Allergies and medications reviewed and updated. Outpatient Medications Prior to Visit  Medication Sig Dispense Refill   acetaminophen (TYLENOL) 500 MG tablet Take 500 mg by mouth as needed for mild pain.     Alirocumab (PRALUENT) 150 MG/ML SOAJ Inject 150 mg into the skin every 14 (fourteen) days. 6 mL 1   amLODipine-benazepril (LOTREL) 5-20 MG capsule Take 1 capsule by mouth daily. For blood pressure. 90 capsule 1   b complex vitamins capsule Take 1 capsule by mouth daily.     brimonidine (ALPHAGAN) 0.2 % ophthalmic solution 1 drop 2 (two) times daily.     Calcium Carbonate-Vit D-Min (CALCIUM 1200 PO) Take by  mouth.     cetirizine (ZYRTEC) 10 MG tablet Take 10 mg by mouth daily.     cholecalciferol (VITAMIN D) 1000 units tablet Take 1,000 Units by mouth daily.     Coenzyme Q10 (COQ10 PO) Take 300 mg by mouth daily.     dorzolamide-timolol (COSOPT) 22.3-6.8 MG/ML ophthalmic solution Place 1 drop into both eyes 2 (two) times daily.     FLUoxetine (PROZAC) 10 MG capsule TAKE 1 CAPSULE BY MOUTH EVERY DAY FOR ANXIETY 90 capsule 0   latanoprost (XALATAN) 0.005 % ophthalmic solution Place 1 drop into both eyes at bedtime.     MAGNESIUM PO Take by mouth.     Multiple Vitamin (MULTIVITAMIN) tablet Take 1 tablet by mouth daily.     Olopatadine HCl (PATADAY OP) Apply to eye.     Propylene Glycol (SYSTANE BALANCE OP) Apply to eye.     rosuvastatin (CRESTOR) 5 MG tablet Take 1 tablet (5 mg total) by mouth 3 (three) times a week. 30 tablet 6   traZODone (DESYREL) 50 MG tablet Take 1 tablet (50 mg total) by mouth at bedtime as needed for sleep. 90 tablet 1   trolamine salicylate (ASPERCREME) 10 % cream Apply 1 application topically as needed for muscle pain.  No facility-administered medications prior to visit.     Per HPI unless specifically indicated in ROS section below Review of Systems  Constitutional:  Negative for fatigue and fever.  HENT:  Negative for congestion.   Eyes:  Negative for pain.  Respiratory:  Negative for cough and shortness of breath.   Cardiovascular:  Negative for chest pain, palpitations and leg swelling.  Gastrointestinal:  Negative for abdominal pain.  Genitourinary:  Positive for pelvic pain and vaginal pain. Negative for decreased urine volume, difficulty urinating, dyspareunia, dysuria, flank pain, frequency, genital sores, hematuria, menstrual problem, urgency, vaginal bleeding and vaginal discharge.  Musculoskeletal:  Negative for back pain.  Neurological:  Negative for syncope, light-headedness and headaches.  Psychiatric/Behavioral:  Negative for dysphoric mood.     Objective:  BP 126/70   Pulse 62   Temp 98.5 F (36.9 C)   Resp 16   Ht 5' 5.5" (1.664 m)   Wt 199 lb (90.3 kg)   SpO2 99%   BMI 32.61 kg/m   Wt Readings from Last 3 Encounters:  12/27/21 199 lb (90.3 kg)  10/24/21 192 lb (87.1 kg)  09/11/21 192 lb 12.8 oz (87.5 kg)      Physical Exam Exam conducted with a chaperone present.  Constitutional:      General: She is not in acute distress.    Appearance: Normal appearance. She is well-developed. She is not ill-appearing or toxic-appearing.  HENT:     Head: Normocephalic.     Right Ear: Hearing, tympanic membrane, ear canal and external ear normal. Tympanic membrane is not erythematous, retracted or bulging.     Left Ear: Hearing, tympanic membrane, ear canal and external ear normal. Tympanic membrane is not erythematous, retracted or bulging.     Nose: No mucosal edema or rhinorrhea.     Right Sinus: No maxillary sinus tenderness or frontal sinus tenderness.     Left Sinus: No maxillary sinus tenderness or frontal sinus tenderness.     Mouth/Throat:     Pharynx: Uvula midline.  Eyes:     General: Lids are normal. Lids are everted, no foreign bodies appreciated.     Conjunctiva/sclera: Conjunctivae normal.     Pupils: Pupils are equal, round, and reactive to light.  Neck:     Thyroid: No thyroid mass or thyromegaly.     Vascular: No carotid bruit.     Trachea: Trachea normal.  Cardiovascular:     Rate and Rhythm: Normal rate and regular rhythm.     Pulses: Normal pulses.     Heart sounds: Normal heart sounds, S1 normal and S2 normal. No murmur heard.    No friction rub. No gallop.  Pulmonary:     Effort: Pulmonary effort is normal. No tachypnea or respiratory distress.     Breath sounds: Normal breath sounds. No decreased breath sounds, wheezing, rhonchi or rales.  Abdominal:     General: Bowel sounds are normal.     Palpations: Abdomen is soft.     Tenderness: There is no abdominal tenderness.     Hernia: There is no  hernia in the left inguinal area or right inguinal area.  Genitourinary:    Exam position: Lithotomy position.     Pubic Area: No rash.      Labia:        Right: No rash or tenderness.        Left: No rash or tenderness.      Urethra: No prolapse, urethral pain, urethral swelling or urethral  lesion.     Vagina: No signs of injury. Erythema present. No vaginal discharge, lesions or prolapsed vaginal walls.     Cervix: Normal.     Adnexa: Right adnexa normal and left adnexa normal.     Comments: Dry vaginal tissues with postmenopausal atrophic changes Musculoskeletal:     Cervical back: Normal range of motion and neck supple.  Lymphadenopathy:     Lower Body: No right inguinal adenopathy. No left inguinal adenopathy.  Skin:    General: Skin is warm and dry.     Findings: No rash.  Neurological:     Mental Status: She is alert.  Psychiatric:        Mood and Affect: Mood is not anxious or depressed.        Speech: Speech normal.        Behavior: Behavior normal. Behavior is cooperative.        Thought Content: Thought content normal.        Judgment: Judgment normal.       Results for orders placed or performed during the hospital encounter of 11/26/21  Urine Culture   Specimen: Urine, Clean Catch  Result Value Ref Range   Specimen Description URINE, CLEAN CATCH    Special Requests      NONE Performed at [facility], [place] [address], [place], [place] [address]    Culture MULTIPLE SPECIES PRESENT, SUGGEST RECOLLECTION (A)    Report Status 11/28/2021 FINAL   POC Urinalysis dipstick  Result Value Ref Range   Glucose, UA NEGATIVE NEGATIVE mg/dL   Bilirubin Urine NEGATIVE NEGATIVE   Ketones, ur NEGATIVE NEGATIVE mg/dL   Specific Gravity, Urine <=1.005 1.005 - 1.030   Hgb urine dipstick TRACE (A) NEGATIVE   pH 6.5 5.0 - 8.0   Protein, ur NEGATIVE NEGATIVE mg/dL   Urobilinogen, UA 0.2 0.0 - 1.0 mg/dL   Nitrite NEGATIVE NEGATIVE   Leukocytes,Ua SMALL (A) NEGATIVE   Cervicovaginal ancillary only  Result Value Ref Range   Bacterial Vaginitis (gardnerella) Negative    Candida Vaginitis Negative    Candida Glabrata Negative    Comment      Normal Reference Range Bacterial Vaginosis - Negative   Comment Normal Reference Range Candida Species - Negative    Comment Normal Reference Range Candida Galbrata - Negative      COVID 19 screen:  No recent travel or known exposure to COVID19 The patient denies respiratory symptoms of COVID 19 at this time. The importance of social distancing was discussed today.   Assessment and Plan  Problem List Items Addressed This Visit     Frequent UTI    Urinalysis and micro in office were clear.  No current urinary tract infection.  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  We discussed that estrogen vaginal cream should decrease the symptoms as well.      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  We will send wet prep out to rule out BV and Candida.  Discussed initiation of vaginal lubrication with coconut oil, olive oil or over-the-counter lubrication.  She will consider estrogen vaginal cream.      Relevant Orders   WET PREP BY MOLECULAR PROBE    Orders Placed This Encounter  Procedures   WET PREP BY  MOLECULAR PROBE   POCT Urinalysis Dipstick (Automated)   POCT UA - Microscopic Only     Eliezer Lofts, MD

## 2021-12-27 NOTE — Patient Instructions (Signed)
Can try application of vaginal lubricant coconut oil, olive oil or lubricant.  We will call with urine results and  wet prep vaginal swab.

## 2021-12-31 DIAGNOSIS — H1045 Other chronic allergic conjunctivitis: Secondary | ICD-10-CM | POA: Diagnosis not present

## 2021-12-31 LAB — WET PREP BY MOLECULAR PROBE
Candida species: NOT DETECTED
Gardnerella vaginalis: NOT DETECTED
MICRO NUMBER:: 13500938
SPECIMEN QUALITY:: ADEQUATE
Trichomonas vaginosis: NOT DETECTED

## 2022-01-14 DIAGNOSIS — H1045 Other chronic allergic conjunctivitis: Secondary | ICD-10-CM | POA: Diagnosis not present

## 2022-01-14 DIAGNOSIS — H524 Presbyopia: Secondary | ICD-10-CM | POA: Diagnosis not present

## 2022-02-15 ENCOUNTER — Other Ambulatory Visit: Payer: Self-pay | Admitting: Primary Care

## 2022-02-15 DIAGNOSIS — I10 Essential (primary) hypertension: Secondary | ICD-10-CM

## 2022-02-19 DIAGNOSIS — H401130 Primary open-angle glaucoma, bilateral, stage unspecified: Secondary | ICD-10-CM | POA: Diagnosis not present

## 2022-02-19 DIAGNOSIS — H1045 Other chronic allergic conjunctivitis: Secondary | ICD-10-CM | POA: Diagnosis not present

## 2022-02-20 ENCOUNTER — Encounter: Payer: Self-pay | Admitting: Primary Care

## 2022-02-20 ENCOUNTER — Ambulatory Visit (INDEPENDENT_AMBULATORY_CARE_PROVIDER_SITE_OTHER)
Admission: RE | Admit: 2022-02-20 | Discharge: 2022-02-20 | Disposition: A | Discharge status: Home / Self Care | Payer: Medicare Other | Source: Ambulatory Visit | Attending: Primary Care | Admitting: Primary Care

## 2022-02-20 ENCOUNTER — Ambulatory Visit (INDEPENDENT_AMBULATORY_CARE_PROVIDER_SITE_OTHER): Payer: Medicare Other | Admitting: Primary Care

## 2022-02-20 VITALS — BP 110/62 | HR 63 | Temp 98.6°F | Ht 65.5 in | Wt 197.0 lb

## 2022-02-20 DIAGNOSIS — Z0001 Encounter for general adult medical examination with abnormal findings: Secondary | ICD-10-CM

## 2022-02-20 DIAGNOSIS — M25552 Pain in left hip: Secondary | ICD-10-CM

## 2022-02-20 DIAGNOSIS — I7121 Aneurysm of the ascending aorta, without rupture: Secondary | ICD-10-CM | POA: Diagnosis not present

## 2022-02-20 DIAGNOSIS — M25551 Pain in right hip: Secondary | ICD-10-CM

## 2022-02-20 DIAGNOSIS — G8929 Other chronic pain: Secondary | ICD-10-CM | POA: Diagnosis not present

## 2022-02-20 DIAGNOSIS — I700 Atherosclerosis of aorta: Secondary | ICD-10-CM

## 2022-02-20 DIAGNOSIS — G4700 Insomnia, unspecified: Secondary | ICD-10-CM

## 2022-02-20 DIAGNOSIS — Z87440 Personal history of urinary (tract) infections: Secondary | ICD-10-CM

## 2022-02-20 DIAGNOSIS — E782 Mixed hyperlipidemia: Secondary | ICD-10-CM

## 2022-02-20 DIAGNOSIS — M159 Polyosteoarthritis, unspecified: Secondary | ICD-10-CM

## 2022-02-20 DIAGNOSIS — I10 Essential (primary) hypertension: Secondary | ICD-10-CM

## 2022-02-20 DIAGNOSIS — M8580 Other specified disorders of bone density and structure, unspecified site: Secondary | ICD-10-CM

## 2022-02-20 DIAGNOSIS — H409 Unspecified glaucoma: Secondary | ICD-10-CM

## 2022-02-20 DIAGNOSIS — F411 Generalized anxiety disorder: Secondary | ICD-10-CM

## 2022-02-20 DIAGNOSIS — N390 Urinary tract infection, site not specified: Secondary | ICD-10-CM

## 2022-02-20 MED ORDER — FLUOXETINE HCL 20 MG PO CAPS: 20.0000 mg | ORAL_CAPSULE | Freq: Every day | ORAL | 0 refills | Status: DC

## 2022-02-20 NOTE — Assessment & Plan Note (Signed)
Continue calcium and vitamin D daily. Discussed importance of daily weight bearing exercise.

## 2022-02-20 NOTE — Assessment & Plan Note (Signed)
Controlled.  Continue Trazodone 50 mg HS PRN.

## 2022-02-20 NOTE — Assessment & Plan Note (Signed)
Uncontrolled, increased caregiver stress.   Increase fluoxetine to 20 mg daily.  New Rx sent to pharmacy.  She will update me via MyChart in 1 month.

## 2022-02-20 NOTE — Assessment & Plan Note (Signed)
Chronic.  Encourage daily walking/activity. Continue Tylenol 500 mg PRN.

## 2022-02-20 NOTE — Assessment & Plan Note (Signed)
Immunizations UTD. Mammogram UTD. Colonoscopy UTD.  Bone density scan UTD.   Discussed the importance of a healthy diet and regular exercise in order for weight loss, and to reduce the risk of further co-morbidity.  Exam stable. Labs pending.  Follow up in 1 year for repeat physical.

## 2022-02-20 NOTE — Assessment & Plan Note (Signed)
Likely osteoarthritis, discussed this today. Checking xrays today. Consider PT.  Continue Tylenol 500 mg PRN.

## 2022-02-20 NOTE — Assessment & Plan Note (Signed)
Reviewed CT scan from October 2022. Following with cardiology.

## 2022-02-20 NOTE — Assessment & Plan Note (Addendum)
Controlled.  Continue amlodipine-benazepril 5-20 mg daily  CMP pending.

## 2022-02-20 NOTE — Patient Instructions (Signed)
Stop by the lab and xrays prior to leaving today. I will notify you of your results once received.   We increased the dose of your fluoxetine to 20 mg. I sent a prescription to your pharmacy. Take 1 tablet by mouth once daily.  It was a pleasure to see you today!  Preventive Care 69 Years and Older, Female Preventive care refers to lifestyle choices and visits with your health care provider that can promote health and wellness. Preventive care visits are also called wellness exams. What can I expect for my preventive care visit? Counseling Your health care provider may ask you questions about your: Medical history, including: Past medical problems. Family medical history. Pregnancy and menstrual history. History of falls. Current health, including: Memory and ability to understand (cognition). Emotional well-being. Home life and relationship well-being. Sexual activity and sexual health. Lifestyle, including: Alcohol, nicotine or tobacco, and drug use. Access to firearms. Diet, exercise, and sleep habits. Work and work Statistician. Sunscreen use. Safety issues such as seatbelt and bike helmet use. Physical exam Your health care provider will check your: Height and weight. These may be used to calculate your BMI (body mass index). BMI is a measurement that tells if you are at a healthy weight. Waist circumference. This measures the distance around your waistline. This measurement also tells if you are at a healthy weight and may help predict your risk of certain diseases, such as type 2 diabetes and high blood pressure. Heart rate and blood pressure. Body temperature. Skin for abnormal spots. What immunizations do I need?  Vaccines are usually given at various ages, according to a schedule. Your health care provider will recommend vaccines for you based on your age, medical history, and lifestyle or other factors, such as travel or where you work. What tests do I  need? Screening Your health care provider may recommend screening tests for certain conditions. This may include: Lipid and cholesterol levels. Hepatitis C test. Hepatitis B test. HIV (human immunodeficiency virus) test. STI (sexually transmitted infection) testing, if you are at risk. Lung cancer screening. Colorectal cancer screening. Diabetes screening. This is done by checking your blood sugar (glucose) after you have not eaten for a while (fasting). Mammogram. Talk with your health care provider about how often you should have regular mammograms. BRCA-related cancer screening. This may be done if you have a family history of breast, ovarian, tubal, or peritoneal cancers. Bone density scan. This is done to screen for osteoporosis. Talk with your health care provider about your test results, treatment options, and if necessary, the need for more tests. Follow these instructions at home: Eating and drinking  Eat a diet that includes fresh fruits and vegetables, whole grains, lean protein, and low-fat dairy products. Limit your intake of foods with high amounts of sugar, saturated fats, and salt. Take vitamin and mineral supplements as recommended by your health care provider. Do not drink alcohol if your health care provider tells you not to drink. If you drink alcohol: Limit how much you have to 0-1 drink a day. Know how much alcohol is in your drink. In the [place] one drink equals one 12 oz bottle of beer (355 mL), one 5 oz glass of wine (148 mL), or one 1 oz glass of hard liquor (44 mL). Lifestyle Brush your teeth every morning and night with fluoride toothpaste. Floss one time each day. Exercise for at least 30 minutes 5 or more days each week. Do not use any products that contain nicotine  or tobacco. These products include cigarettes, chewing tobacco, and vaping devices, such as e-cigarettes. If you need help quitting, ask your health care provider. Do not use drugs. If you are  sexually active, practice safe sex. Use a condom or other form of protection in order to prevent STIs. Take aspirin only as told by your health care provider. Make sure that you understand how much to take and what form to take. Work with your health care provider to find out whether it is safe and beneficial for you to take aspirin daily. Ask your health care provider if you need to take a cholesterol-lowering medicine (statin). Find healthy ways to manage stress, such as: Meditation, yoga, or listening to music. Journaling. Talking to a trusted person. Spending time with friends and family. Minimize exposure to UV radiation to reduce your risk of skin cancer. Safety Always wear your seat belt while driving or riding in a vehicle. Do not drive: If you have been drinking alcohol. Do not ride with someone who has been drinking. When you are tired or distracted. While texting. If you have been using any mind-altering substances or drugs. Wear a helmet and other protective equipment during sports activities. If you have firearms in your house, make sure you follow all gun safety procedures. What's next? Visit your health care provider once a year for an annual wellness visit. Ask your health care provider how often you should have your eyes and teeth checked. Stay up to date on all vaccines. This information is not intended to replace advice given to you by your health care provider. Make sure you discuss any questions you have with your health care provider. Document Revised: 01/03/2021 Document Reviewed: 01/03/2021 [employer] Patient Education  [employer].

## 2022-02-20 NOTE — Assessment & Plan Note (Signed)
Continue Praulent 150 mg every 2 weeks.

## 2022-02-20 NOTE — Progress Notes (Signed)
Subjective:    Patient ID: Marie Ruiz, female    DOB: 02/20/1946, 76 y.o.   MRN: [id number]  HPI  Marie Ruiz is a very pleasant 76 y.o. female who presents today for complete physical and follow up of chronic conditions.  She would also like to discuss chronic, deep, posterior pelvic pain that began several years ago, increased over the last several months. Pain is worse with prolonged sitting. She has generalized joint aches, has been diagnosed with osteoarthritis to her knees. She denies stiffness to the site when getting up in the morning. She would like to look into the cause for her symptoms.   She is also interested in a dose adjustment of her fluoxetine to 20 mg. Currently managed on fluoxetine 10 mg daily, continues to struggle with stress and anxiety as she is the sole caregiver of her husband. She questions if a dose increase would help.   Immunizations: -Tetanus: 2017 -Influenza: Completed last season -Covid-19: 5 vaccines -Shingles: Completed Shingrix series -Pneumonia: Prevnar 13 in 2017, Pneumovax 23 in 2015  Diet: [place].  Exercise: No regular exercise.  Eye exam: Completes regularly   Dental exam: Completes semi-annually   Mammogram: Completed in April 2023  Colonoscopy: Completed in 2021, due 4403 if applicable Dexa: Completed in 2022, osteopenia   BP Readings from Last 3 Encounters:  02/20/22 110/62  12/27/21 126/70  11/26/21 128/85        Review of Systems  Constitutional:  Negative for unexpected weight change.  HENT:  Negative for rhinorrhea.   Eyes:  Negative for visual disturbance.  Respiratory:  Negative for cough and shortness of breath.   Cardiovascular:  Negative for chest pain.  Gastrointestinal:  Negative for constipation and diarrhea.  Genitourinary:  Negative for difficulty urinating.  Musculoskeletal:  Positive for arthralgias.  Skin:  Negative for rash.  Allergic/Immunologic: Negative for environmental  allergies.  Neurological:  Negative for dizziness and headaches.  Psychiatric/Behavioral:  The patient is nervous/anxious.          Past Medical History:  Diagnosis Date   Allergy    seasonal   Anxiety    Aortic aneurysm ([place]) 08/2018   Arthritis    Blood clotting disorder (HCC)    Bone spur    with excision   Chickenpox    Colon polyps    DVT (deep venous thrombosis) (HCC)    Glaucoma    both   Graves disease 03/30/2013   History of blood clots 10/2017   Hyperlipemia    Hypertension    Osteopenia    Prediabetes    Restless leg syndrome     Social History   Socioeconomic History   Marital status: Married    Spouse name: Not on file   Number of children: Not on file   Years of education: Not on file   Highest education level: Not on file  Occupational History   Occupation: Retired  Tobacco Use   Smoking status: Former    Types: Cigarettes    Quit date: 1990    Years since quitting: 33.6   Smokeless tobacco: Never  Vaping Use   Vaping Use: Never used  Substance and Sexual Activity   Alcohol use: Yes    Alcohol/week: 2.0 standard drinks of alcohol    Types: 2 Standard drinks or equivalent per week   Drug use: Not Currently   Sexual activity: Not Currently  Other Topics Concern   Not on file  Social History Narrative  Lives in [place], moving to [place].   Retired.   Married.    Social Determinants of Health   Financial Resource Strain: Low Risk  (10/24/2021)   Overall Financial Resource Strain (CARDIA)    Difficulty of Paying Living Expenses: Not hard at all  Food Insecurity: No Food Insecurity (10/24/2021)   Hunger Vital Sign    Worried About Running Out of Food in the Last Year: Never true    Ran Out of Food in the Last Year: Never true  Transportation Needs: No Transportation Needs (10/24/2021)   PRAPARE - Hydrologist (Medical): No    Lack of Transportation (Non-Medical): No  Physical Activity: Insufficiently Active  (10/24/2021)   Exercise Vital Sign    Days of Exercise per Week: 2 days    Minutes of Exercise per Session: 30 min  Stress: No Stress Concern Present (10/24/2021)   [place]    Feeling of Stress : Only a little  Social Connections: Moderately Integrated (10/24/2021)   Social Connection and Isolation Panel [NHANES]    Frequency of Communication with Friends and Family: More than three times a week    Frequency of Social Gatherings with Friends and Family: More than three times a week    Attends Religious Services: More than 4 times per year    Active Member of [employer] or Organizations: No    Attends Archivist Meetings: Never    Marital Status: Married  Human resources officer Violence: Not At Risk (10/24/2021)   Humiliation, Afraid, Rape, and Kick questionnaire    Fear of Current or Ex-Partner: No    Emotionally Abused: No    Physically Abused: No    Sexually Abused: No    Past Surgical History:  Procedure Laterality Date   BREAST CYST ASPIRATION     CATARACT EXTRACTION W/ INTRAOCULAR LENS IMPLANT Right 2015   CATARACT EXTRACTION W/PHACO Left 2017   COLONOSCOPY  2014   DILATION AND CURETTAGE OF UTERUS      Family History  Problem Relation Age of Onset   Heart disease Mother    Heart attack Father    Breast cancer Sister 54   Colon polyps Sister    Aneurysm Brother    Multiple myeloma Sister        found in stage 4   Breast cancer Maternal Grandmother    Esophageal cancer Maternal Aunt 82   Colon cancer Neg Hx    Rectal cancer Neg Hx    Stomach cancer Neg Hx     Allergies  Allergen Reactions   Amitriptyline Other (See Comments)    hallucinations   Codeine    Crestor [Rosuvastatin]     Myalgias    Lipitor [Atorvastatin]     Myaglias    Prednisone     Depression    Current Outpatient Medications on File Prior to Visit  Medication Sig Dispense Refill   acetaminophen (TYLENOL) 500 MG tablet Take  500 mg by mouth as needed for mild pain.     Alirocumab (PRALUENT) 150 MG/ML SOAJ Inject 150 mg into the skin every 14 (fourteen) days. 6 mL 1   amLODipine-benazepril (LOTREL) 5-20 MG capsule Take 1 capsule by mouth daily. For blood pressure. Office visit required for further refills. 90 capsule 0   b complex vitamins capsule Take 1 capsule by mouth daily.     brimonidine (ALPHAGAN) 0.2 % ophthalmic solution 1 drop 2 (two) times daily.  Calcium Carbonate-Vit D-Min (CALCIUM 1200 PO) Take by mouth.     cetirizine (ZYRTEC) 10 MG tablet Take 10 mg by mouth daily.     cholecalciferol (VITAMIN D) 1000 units tablet Take 1,000 Units by mouth daily.     Coenzyme Q10 (COQ10 PO) Take 300 mg by mouth daily.     cyanocobalamin (VITAMIN B12) 1000 MCG tablet Take 1,000 mcg by mouth daily.     dorzolamide-timolol (COSOPT) 22.3-6.8 MG/ML ophthalmic solution Place 1 drop into both eyes 2 (two) times daily.     latanoprost (XALATAN) 0.005 % ophthalmic solution Place 1 drop into both eyes at bedtime.     MAGNESIUM PO Take by mouth.     Multiple Vitamin (MULTIVITAMIN) tablet Take 1 tablet by mouth daily.     Olopatadine HCl (PATADAY OP) Apply to eye.     Propylene Glycol (SYSTANE BALANCE OP) Apply to eye.     traZODone (DESYREL) 50 MG tablet Take 1 tablet (50 mg total) by mouth at bedtime as needed for sleep. 90 tablet 1   trolamine salicylate (ASPERCREME) 10 % cream Apply 1 application topically as needed for muscle pain.     aspirin EC 81 MG tablet Take 81 mg by mouth daily.     No current facility-administered medications on file prior to visit.    BP 110/62   Pulse 63   Temp 98.6 F (37 C) (Oral)   Ht 5' 5.5" (1.664 m)   Wt 197 lb (89.4 kg)   SpO2 97%   BMI 32.28 kg/m  Objective:   Physical Exam HENT:     Right Ear: Tympanic membrane and ear canal normal.     Left Ear: Tympanic membrane and ear canal normal.     Nose: Nose normal.  Eyes:     Conjunctiva/sclera: Conjunctivae normal.      Pupils: Pupils are equal, round, and reactive to light.  Neck:     Thyroid: No thyromegaly.  Cardiovascular:     Rate and Rhythm: Normal rate and regular rhythm.     Heart sounds: No murmur heard. Pulmonary:     Effort: Pulmonary effort is normal.     Breath sounds: Normal breath sounds. No rales.  Abdominal:     General: Bowel sounds are normal.     Palpations: Abdomen is soft.     Tenderness: There is no abdominal tenderness.  Musculoskeletal:        General: Normal range of motion.     Cervical back: Neck supple.     Right hip: Normal. Normal range of motion. Normal strength.     Left hip: Normal. Normal range of motion. Normal strength.     Right upper leg: Normal.     Left upper leg: Normal.     Right lower leg: No swelling.     Left lower leg: No swelling.  Lymphadenopathy:     Cervical: No cervical adenopathy.  Skin:    General: Skin is warm and dry.     Findings: No rash.  Neurological:     Mental Status: She is alert and oriented to person, place, and time.     Cranial Nerves: No cranial nerve deficit.     Deep Tendon Reflexes: Reflexes are normal and symmetric.  Psychiatric:        Mood and Affect: Mood normal.           Assessment & Plan:   Problem List Items Addressed This Visit       Cardiovascular  and Mediastinum   Essential hypertension    Controlled.  Continue amlodipine-benazepril 5-20 mg daily  CMP pending.      Relevant Medications   aspirin EC 81 MG tablet   Other Relevant Orders   TSH   CBC   Comprehensive metabolic panel   Ascending aortic aneurysm [facility])    Reviewed CT scan from October 2022. Following with cardiology.       Relevant Medications   aspirin EC 81 MG tablet   Aortic atherosclerosis (HCC)    Continue Praulent 150 mg every 2 weeks.       Relevant Medications   aspirin EC 81 MG tablet     Musculoskeletal and Integument   Osteoarthritis    Chronic.  Encourage daily walking/activity. Continue Tylenol 500 mg  PRN.       Relevant Medications   aspirin EC 81 MG tablet   Osteopenia    Continue calcium and vitamin D daily. Discussed importance of daily weight bearing exercise.         Genitourinary   Frequent UTI    No recent UTI.  Continue vaginal moisturizer.  Continue to monitor.         Other   GAD (generalized anxiety disorder)    Uncontrolled, increased caregiver stress.   Increase fluoxetine to 20 mg daily.  New Rx sent to pharmacy.  She will update me via MyChart in 1 month.      Relevant Medications   FLUoxetine (PROZAC) 20 MG capsule   Hyperlipidemia    Repeat lipid panel pending.  Continue Praulent 150 mg every 2 weeks.      Relevant Medications   aspirin EC 81 MG tablet   Other Relevant Orders   Lipid panel   Glaucoma    Following with ophthalmology. Continue current regimen.      Insomnia    Controlled.  Continue Trazodone 50 mg HS PRN.      Encounter for annual general medical examination with abnormal findings in adult - Primary    Immunizations UTD. Mammogram UTD. Colonoscopy UTD.  Bone density scan UTD.   Discussed the importance of a healthy diet and regular exercise in order for weight loss, and to reduce the risk of further co-morbidity.  Exam stable. Labs pending.  Follow up in 1 year for repeat physical.       Chronic pain of both hips    Likely osteoarthritis, discussed this today. Checking xrays today. Consider PT.  Continue Tylenol 500 mg PRN.      Relevant Medications   aspirin EC 81 MG tablet   FLUoxetine (PROZAC) 20 MG capsule   Other Relevant Orders   DG HIPS BILAT WITH PELVIS 3-4 VIEWS       Pleas [name], NP

## 2022-02-20 NOTE — Assessment & Plan Note (Signed)
Repeat lipid panel pending.  Continue Praulent 150 mg every 2 weeks.

## 2022-02-20 NOTE — Assessment & Plan Note (Signed)
Following with ophthalmology. Continue current regimen.

## 2022-02-20 NOTE — Assessment & Plan Note (Signed)
No recent UTI.  Continue vaginal moisturizer.  Continue to monitor.

## 2022-02-21 LAB — CBC
HCT: 41.4 % (ref 36.0–46.0)
Hemoglobin: 13.7 g/dL (ref 12.0–15.0)
MCHC: 33.1 g/dL (ref 30.0–36.0)
MCV: 93.8 fl (ref 78.0–100.0)
Platelets: 290 10*3/uL (ref 150.0–400.0)
RBC: 4.41 Mil/uL (ref 3.87–5.11)
RDW: 12.6 % (ref 11.5–15.5)
WBC: 5.5 10*3/uL (ref 4.0–10.5)

## 2022-02-21 LAB — LIPID PANEL
Cholesterol: 177 mg/dL (ref 0–200)
HDL: 64 mg/dL (ref >39.00)
LDL Cholesterol: 77 mg/dL (ref 0–99)
NonHDL: 113.35
Total CHOL/HDL Ratio: 3
Triglycerides: 181 mg/dL — ABNORMAL HIGH (ref 0.0–149.0)
VLDL: 36.2 mg/dL (ref 0.0–40.0)

## 2022-02-21 LAB — COMPREHENSIVE METABOLIC PANEL
ALT: 14 U/L (ref 0–35)
AST: 19 U/L (ref 0–37)
Albumin: 4.7 g/dL (ref 3.5–5.2)
Alkaline Phosphatase: 57 U/L (ref 39–117)
BUN: 10 mg/dL (ref 6–23)
CO2: 27 mEq/L (ref 19–32)
Calcium: 10 mg/dL (ref 8.4–10.5)
Chloride: 101 mEq/L (ref 96–112)
Creatinine, Ser: 0.8 mg/dL (ref 0.40–1.20)
GFR: 71.76 mL/min (ref >60.00)
Glucose, Bld: 97 mg/dL (ref 70–99)
Potassium: 4.1 mEq/L (ref 3.5–5.1)
Sodium: 137 mEq/L (ref 135–145)
Total Bilirubin: 0.5 mg/dL (ref 0.2–1.2)
Total Protein: 7.5 g/dL (ref 6.0–8.3)

## 2022-02-21 LAB — TSH: TSH: 1.29 u[IU]/mL (ref 0.35–5.50)

## 2022-02-26 DIAGNOSIS — H1045 Other chronic allergic conjunctivitis: Secondary | ICD-10-CM | POA: Diagnosis not present

## 2022-02-26 DIAGNOSIS — H401130 Primary open-angle glaucoma, bilateral, stage unspecified: Secondary | ICD-10-CM | POA: Diagnosis not present

## 2022-03-06 DIAGNOSIS — H401130 Primary open-angle glaucoma, bilateral, stage unspecified: Secondary | ICD-10-CM | POA: Diagnosis not present

## 2022-03-06 DIAGNOSIS — H1045 Other chronic allergic conjunctivitis: Secondary | ICD-10-CM | POA: Diagnosis not present

## 2022-03-13 DIAGNOSIS — H401130 Primary open-angle glaucoma, bilateral, stage unspecified: Secondary | ICD-10-CM | POA: Diagnosis not present

## 2022-03-13 DIAGNOSIS — H1045 Other chronic allergic conjunctivitis: Secondary | ICD-10-CM | POA: Diagnosis not present

## 2022-03-27 DIAGNOSIS — H401130 Primary open-angle glaucoma, bilateral, stage unspecified: Secondary | ICD-10-CM | POA: Diagnosis not present

## 2022-03-27 DIAGNOSIS — H1045 Other chronic allergic conjunctivitis: Secondary | ICD-10-CM | POA: Diagnosis not present

## 2022-03-28 ENCOUNTER — Other Ambulatory Visit: Payer: Self-pay | Admitting: Primary Care

## 2022-03-28 DIAGNOSIS — F411 Generalized anxiety disorder: Secondary | ICD-10-CM

## 2022-04-03 DIAGNOSIS — H401130 Primary open-angle glaucoma, bilateral, stage unspecified: Secondary | ICD-10-CM | POA: Diagnosis not present

## 2022-04-03 DIAGNOSIS — H1045 Other chronic allergic conjunctivitis: Secondary | ICD-10-CM | POA: Diagnosis not present

## 2022-04-17 DIAGNOSIS — H401130 Primary open-angle glaucoma, bilateral, stage unspecified: Secondary | ICD-10-CM | POA: Diagnosis not present

## 2022-04-24 ENCOUNTER — Encounter: Payer: Self-pay | Admitting: Pharmacist

## 2022-04-25 ENCOUNTER — Other Ambulatory Visit: Payer: Self-pay

## 2022-04-25 DIAGNOSIS — E785 Hyperlipidemia, unspecified: Secondary | ICD-10-CM

## 2022-04-26 LAB — LIPID PANEL
Chol/HDL Ratio: 3.1 ratio (ref 0.0–4.4)
Cholesterol, Total: 196 mg/dL (ref 100–199)
HDL: 63 mg/dL (ref >39)
LDL Chol Calc (NIH): 107 mg/dL — ABNORMAL HIGH (ref 0–99)
Triglycerides: 152 mg/dL — ABNORMAL HIGH (ref 0–149)
VLDL Cholesterol Cal: 26 mg/dL (ref 5–40)

## 2022-04-30 ENCOUNTER — Other Ambulatory Visit: Payer: Self-pay | Admitting: Primary Care

## 2022-04-30 DIAGNOSIS — F411 Generalized anxiety disorder: Secondary | ICD-10-CM

## 2022-05-01 ENCOUNTER — Other Ambulatory Visit: Payer: Self-pay | Admitting: Primary Care

## 2022-05-01 ENCOUNTER — Telehealth: Payer: Self-pay | Admitting: Primary Care

## 2022-05-01 DIAGNOSIS — I10 Essential (primary) hypertension: Secondary | ICD-10-CM

## 2022-05-01 DIAGNOSIS — F411 Generalized anxiety disorder: Secondary | ICD-10-CM

## 2022-05-01 NOTE — Telephone Encounter (Signed)
Spoke with patient. She states someone named Audelia Acton called her. Notified her we do not have someone named Audelia Acton within our office and the only thing that I see in her chart was a recent refill request. She confirmed she needs the refill of prozac '20mg'$  and amlodipine

## 2022-05-01 NOTE — Telephone Encounter (Signed)
Spoke with patient verified she needs amlodipine and prozac '20mg'$  not prozac '10mg'$ .

## 2022-05-01 NOTE — Telephone Encounter (Signed)
Pt received a call to call the office. No note in her chart regarding what needed to be communicated.

## 2022-05-28 ENCOUNTER — Other Ambulatory Visit: Payer: Self-pay | Admitting: Internal Medicine

## 2022-05-28 DIAGNOSIS — E782 Mixed hyperlipidemia: Secondary | ICD-10-CM

## 2022-05-28 DIAGNOSIS — I700 Atherosclerosis of aorta: Secondary | ICD-10-CM

## 2022-05-28 DIAGNOSIS — E785 Hyperlipidemia, unspecified: Secondary | ICD-10-CM

## 2022-06-10 ENCOUNTER — Ambulatory Visit (HOSPITAL_COMMUNITY)
Admission: EM | Admit: 2022-06-10 | Discharge: 2022-06-10 | Disposition: A | Discharge status: Home / Self Care | Payer: Medicare Other | Attending: Family Medicine | Admitting: Family Medicine

## 2022-06-10 ENCOUNTER — Encounter (HOSPITAL_COMMUNITY): Payer: Self-pay

## 2022-06-10 DIAGNOSIS — J069 Acute upper respiratory infection, unspecified: Secondary | ICD-10-CM | POA: Insufficient documentation

## 2022-06-10 DIAGNOSIS — R059 Cough, unspecified: Secondary | ICD-10-CM | POA: Insufficient documentation

## 2022-06-10 DIAGNOSIS — U071 COVID-19: Secondary | ICD-10-CM | POA: Insufficient documentation

## 2022-06-10 DIAGNOSIS — Z79899 Other long term (current) drug therapy: Secondary | ICD-10-CM | POA: Diagnosis not present

## 2022-06-10 LAB — RESP PANEL BY RT-PCR (RSV, FLU A&B, COVID)  RVPGX2
Influenza A by PCR: NEGATIVE
Influenza B by PCR: NEGATIVE
Resp Syncytial Virus by PCR: NEGATIVE
SARS Coronavirus 2 by RT PCR: POSITIVE — AB

## 2022-06-10 LAB — POC INFLUENZA A AND B ANTIGEN (URGENT CARE ONLY)
INFLUENZA A ANTIGEN, POC: NEGATIVE
INFLUENZA B ANTIGEN, POC: NEGATIVE

## 2022-06-10 MED ORDER — BENZONATATE 100 MG PO CAPS: ORAL_CAPSULE | ORAL | 0 refills | Status: DC

## 2022-06-10 NOTE — ED Triage Notes (Signed)
Chief Complaint: body aches, voice loss, chest tightness, cough.   Onset: Saturday night  OTC medications tried: Yes- tylenol, and congestion medication    with mild relief  Sick exposure: Yes- Went to [place] and there with people there with the same symptoms  New foods or medications: No  Recent Travel: No

## 2022-06-10 NOTE — Discharge Instructions (Addendum)
You have been tested for COVID-19/RSV/influenza today. If your test returns positive, you will receive a phone call from [facility] regarding your results. Negative test results are not called. Both positive and negative results area always visible on MyChart. If you do not have a MyChart account, sign up instructions are provided in your discharge papers. Please do not hesitate to contact us should you have questions or concerns.

## 2022-06-11 ENCOUNTER — Telehealth: Payer: Self-pay | Admitting: Primary Care

## 2022-06-11 DIAGNOSIS — U071 COVID-19: Secondary | ICD-10-CM

## 2022-06-11 MED ORDER — NIRMATRELVIR/RITONAVIR (PAXLOVID)TABLET: 3.0000 | ORAL_TABLET | Freq: Two times a day (BID) | ORAL | 0 refills | Status: AC

## 2022-06-11 NOTE — Telephone Encounter (Signed)
I don't see notes from the ED provider. From the [facility] note is looks like symptom onset was three days ago.  How is she feeling?  What are her symptoms?  Is she better, worse, or about the same?

## 2022-06-11 NOTE — Telephone Encounter (Signed)
Unable to reach patient. Left voicemail to return call to our office.   

## 2022-06-11 NOTE — Telephone Encounter (Signed)
Symptoms started 06/08/22  Chest tightness has loosened with the benzonatate capsules, she is still experiencing a lot of fatigue, eye soreness and body aches. Last time she checked her temperature yesterday was 101. She states she feels about the same as when she was seen in the ED.   Patient is wondering if she would be a candidate for Paxlovid. I advised it is not standard procedure for [place] to prescribe medication without you evaluating. She insisted I ask if this is something you would consider prescribing for her.

## 2022-06-11 NOTE — Telephone Encounter (Signed)
Pt called in stated she tested positive for Covid and was given benzonatate (TESSALON) 100 MG capsule . Pt wants to know is there anything else she should be tacking . Please advise #   [phone]

## 2022-06-11 NOTE — Telephone Encounter (Signed)
Advised patient that antiviral medication was sent in to the pharmacy. ED precautions were given.  She will follow up if she is seeing no improvement.

## 2022-06-11 NOTE — Telephone Encounter (Signed)
Please thank her for answering my questions. These questions were asked to gauge if she needed antiviral treatment and from her answers it seems like she certainly does.  I will send Paxlovid to her pharmacy. She qualifies as she is within the window for treatment and given her medical history.   Take all three tablets twice daily for 5 days.  Have her follow up with [place] if no improvement. Give ED precautions.

## 2022-06-12 NOTE — ED Provider Notes (Signed)
[facility]   [id number] 06/10/22 Arrival Time: 4696  ASSESSMENT & PLAN:  1. Viral URI with cough    Rapid flu negative. Discussed typical duration of likely viral illness. Viral testing sent. OTC symptom care as needed.  Discharge Medication List as of 06/10/2022  9:48 AM     START taking these medications   Details  benzonatate (TESSALON) 100 MG capsule Take 1 capsule by mouth every 8 (eight) hours for cough., Normal         Follow-up Information     Pleas [name], NP.   Specialty: Internal Medicine Why: If worsening or failing to improve as anticipated. Contact information: [place] [address] [phone]                 Reviewed expectations re: course of current medical issues. Questions answered. Outlined signs and symptoms indicating need for more acute intervention. Understanding verbalized. After Visit Summary given.   SUBJECTIVE: History from: Patient. Marie Ruiz is a 76 y.o. female. Reports: Chief Complaint: body aches, voice loss, chest tightness, cough.   Onset: Saturday night  OTC medications tried: Yes- tylenol, and congestion medication    with mild relief  Sick exposure: Yes- Went to [place] and there with people there with the same symptoms  New foods or medications: No  Recent Travel: No   OBJECTIVE:  Vitals:   06/10/22 0835  BP: 122/80  Pulse: 90  Resp: 16  Temp: (!) 101 F (38.3 C)  TempSrc: Oral  SpO2: 94%    General appearance: alert; no distress Eyes: PERRLA; EOMI; conjunctiva normal HENT: [place]; AT; with nasal congestion Neck: supple  Lungs: speaks full sentences without difficulty; unlabored Extremities: no edema Skin: warm and dry Neurologic: normal gait Psychological: alert and cooperative; normal mood and affect   Allergies  Allergen Reactions   Amitriptyline Other (See Comments)    hallucinations   Codeine    Crestor [Rosuvastatin]     Myalgias     Lipitor [Atorvastatin]     Myaglias    Prednisone     Depression    Past Medical History:  Diagnosis Date   Allergy    seasonal   Anxiety    Aortic aneurysm ([place]) 08/2018   Arthritis    Blood clotting disorder (HCC)    Bone spur    with excision   Chickenpox    Colon polyps    DVT (deep venous thrombosis) (HCC)    Glaucoma    both   Graves disease 03/30/2013   History of blood clots 10/2017   Hyperlipemia    Hypertension    Osteopenia    Prediabetes    Restless leg syndrome    Social History   Socioeconomic History   Marital status: Married    Spouse name: Not on file   Number of children: Not on file   Years of education: Not on file   Highest education level: Not on file  Occupational History   Occupation: Retired  Tobacco Use   Smoking status: Former    Types: Cigarettes    Quit date: 1990    Years since quitting: 33.9   Smokeless tobacco: Never  Vaping Use   Vaping Use: Never used  Substance and Sexual Activity   Alcohol use: Yes    Alcohol/week: 2.0 standard drinks of alcohol    Types: 2 Standard drinks or equivalent per week   Drug use: Not Currently   Sexual activity: Not Currently  Other Topics Concern   Not on file  Social History Narrative   Lives in [place], moving to [place].   Retired.   Married.    Social Determinants of Health   Financial Resource Strain: Low Risk  (10/24/2021)   Overall Financial Resource Strain (CARDIA)    Difficulty of Paying Living Expenses: Not hard at all  Food Insecurity: No Food Insecurity (10/24/2021)   Hunger Vital Sign    Worried About Running Out of Food in the Last Year: Never true    Ran Out of Food in the Last Year: Never true  Transportation Needs: No Transportation Needs (10/24/2021)   PRAPARE - Hydrologist (Medical): No    Lack of Transportation (Non-Medical): No  Physical Activity: Insufficiently Active (10/24/2021)   Exercise Vital Sign    Days of Exercise per Week: 2 days     Minutes of Exercise per Session: 30 min  Stress: No Stress Concern Present (10/24/2021)   [place]    Feeling of Stress : Only a little  Social Connections: Moderately Integrated (10/24/2021)   Social Connection and Isolation Panel [NHANES]    Frequency of Communication with Friends and Family: More than three times a week    Frequency of Social Gatherings with Friends and Family: More than three times a week    Attends Religious Services: More than 4 times per year    Active Member of [employer] or Organizations: No    Attends Archivist Meetings: Never    Marital Status: Married  Human resources officer Violence: Not At Risk (10/24/2021)   Humiliation, Afraid, Rape, and Kick questionnaire    Fear of Current or Ex-Partner: No    Emotionally Abused: No    Physically Abused: No    Sexually Abused: No   Family History  Problem Relation Age of Onset   Heart disease Mother    Heart attack Father    Breast cancer Sister 39   Colon polyps Sister    Aneurysm Brother    Multiple myeloma Sister        found in stage 4   Breast cancer Maternal Grandmother    Esophageal cancer Maternal Aunt 82   Colon cancer Neg Hx    Rectal cancer Neg Hx    Stomach cancer Neg Hx    Past Surgical History:  Procedure Laterality Date   BREAST CYST ASPIRATION     CATARACT EXTRACTION W/ INTRAOCULAR LENS IMPLANT Right 2015   CATARACT EXTRACTION W/PHACO Left 2017   COLONOSCOPY  2014   [place] OF UTERUS       Vanessa Kick, MD 06/12/22 [phone]

## 2022-06-21 DIAGNOSIS — H04123 Dry eye syndrome of bilateral lacrimal glands: Secondary | ICD-10-CM | POA: Diagnosis not present

## 2022-06-21 DIAGNOSIS — H401133 Primary open-angle glaucoma, bilateral, severe stage: Secondary | ICD-10-CM | POA: Diagnosis not present

## 2022-06-21 DIAGNOSIS — Z961 Presence of intraocular lens: Secondary | ICD-10-CM | POA: Diagnosis not present

## 2022-06-21 DIAGNOSIS — I10 Essential (primary) hypertension: Secondary | ICD-10-CM | POA: Diagnosis not present

## 2022-07-09 ENCOUNTER — Other Ambulatory Visit: Payer: Self-pay

## 2022-07-09 DIAGNOSIS — I700 Atherosclerosis of aorta: Secondary | ICD-10-CM | POA: Diagnosis not present

## 2022-07-09 DIAGNOSIS — E782 Mixed hyperlipidemia: Secondary | ICD-10-CM | POA: Diagnosis not present

## 2022-07-09 LAB — LIPID PANEL
Chol/HDL Ratio: 2.8 ratio (ref 0.0–4.4)
Cholesterol, Total: 174 mg/dL (ref 100–199)
HDL: 63 mg/dL (ref >39)
LDL Chol Calc (NIH): 82 mg/dL (ref 0–99)
Triglycerides: 174 mg/dL — ABNORMAL HIGH (ref 0–149)
VLDL Cholesterol Cal: 29 mg/dL (ref 5–40)

## 2022-07-24 ENCOUNTER — Telehealth: Payer: Self-pay | Admitting: Internal Medicine

## 2022-07-24 NOTE — Telephone Encounter (Signed)
*  STAT* If patient is at the pharmacy, call can be transferred to refill team.   1. Which medications need to be refilled? (please list name of each medication and dose if known)  Crestor 10 MG  2. Which pharmacy/location (including street and city if local pharmacy) is medication to be sent to? [id number] [place] [place] [email or web address]. Do they need a 30 day or 90 day supply?  90 day supply

## 2022-07-25 ENCOUNTER — Telehealth: Payer: Self-pay | Admitting: Primary Care

## 2022-07-25 NOTE — Telephone Encounter (Signed)
Pt called requesting a call back regarding medication stated would like to continue taking Rx Crestor 10 mg  . Please advise # [phone]

## 2022-07-25 NOTE — Telephone Encounter (Signed)
Notified patient that per chart review her cardiologist discontinued Crestor and started her on Praluent. Advised to reach out to cardiologist office to inquire about switching medications.

## 2022-07-26 DIAGNOSIS — I700 Atherosclerosis of aorta: Secondary | ICD-10-CM

## 2022-07-26 DIAGNOSIS — E782 Mixed hyperlipidemia: Secondary | ICD-10-CM

## 2022-07-26 DIAGNOSIS — E785 Hyperlipidemia, unspecified: Secondary | ICD-10-CM

## 2022-07-26 NOTE — Telephone Encounter (Signed)
Attempted to call patient, left message for patient to call back to office.   

## 2022-07-26 NOTE — Telephone Encounter (Signed)
  Pt is returning call, she requested if Eliezer Lofts can leabe her a detailed message on her VM and also send her a message on mychart

## 2022-07-26 NOTE — Telephone Encounter (Signed)
MyChart message sent to patient. Please see MyChart encounter.

## 2022-07-26 NOTE — Telephone Encounter (Signed)
Pt returning call

## 2022-07-29 MED ORDER — PRALUENT 150 MG/ML ~~LOC~~ SOAJ: 150.0000 mg | SUBCUTANEOUS | 3 refills | Status: DC

## 2022-07-29 NOTE — Telephone Encounter (Signed)
Sent PA request 07/29/22  [facility]

## 2022-08-02 ENCOUNTER — Other Ambulatory Visit: Payer: Self-pay | Admitting: Primary Care

## 2022-08-02 DIAGNOSIS — F411 Generalized anxiety disorder: Secondary | ICD-10-CM

## 2022-08-05 ENCOUNTER — Other Ambulatory Visit (HOSPITAL_COMMUNITY): Payer: Self-pay

## 2022-08-20 DIAGNOSIS — H02811 Retained foreign body in right upper eyelid: Secondary | ICD-10-CM | POA: Diagnosis not present

## 2022-09-04 DIAGNOSIS — X32XXXA Exposure to sunlight, initial encounter: Secondary | ICD-10-CM | POA: Diagnosis not present

## 2022-09-04 DIAGNOSIS — L570 Actinic keratosis: Secondary | ICD-10-CM | POA: Diagnosis not present

## 2022-09-04 DIAGNOSIS — L820 Inflamed seborrheic keratosis: Secondary | ICD-10-CM | POA: Diagnosis not present

## 2022-09-25 ENCOUNTER — Telehealth: Payer: Self-pay | Admitting: Primary Care

## 2022-09-25 NOTE — Telephone Encounter (Signed)
Called patient to schedule Medicare Annual Wellness Visit (AWV). Left message for patient to call back and schedule Medicare Annual Wellness Visit (AWV).  Last date of AWV: 10/25/2022  Please schedule an appointment at any time with [facility].  If any questions, please contact me at [phone].  Thank you ,  [place] Direct Dial: [phone]

## 2022-09-26 ENCOUNTER — Telehealth: Payer: Self-pay | Admitting: Primary Care

## 2022-09-26 NOTE — Telephone Encounter (Signed)
Contacted Marie Ruiz to schedule their annual wellness visit. Appointment made for 10/31/2022.  [place] Direct Dial: [phone]

## 2022-10-31 ENCOUNTER — Ambulatory Visit (INDEPENDENT_AMBULATORY_CARE_PROVIDER_SITE_OTHER): Payer: Medicare Other

## 2022-10-31 VITALS — BP 116/80 | HR 57 | Ht 65.5 in | Wt 198.2 lb

## 2022-10-31 DIAGNOSIS — Z0000 Encounter for general adult medical examination without abnormal findings: Secondary | ICD-10-CM | POA: Diagnosis not present

## 2022-10-31 NOTE — Progress Notes (Addendum)
Subjective:   Marie Ruiz is a 77 y.o. female who presents for Medicare Annual (Subsequent) preventive examination.  Review of Systems     Cardiac Risk Factors include: advanced age (>11men, >9 women);hypertension;sedentary lifestyle     Objective:    Today's Vitals   10/31/22 1504  BP: 116/80  Pulse: (!) 57  SpO2: 97%  Weight: 198 lb 3.2 oz (89.9 kg)  Height: 5' 5.5" (1.664 m)   Body mass index is 32.48 kg/m.     10/31/2022    3:35 PM 10/24/2021   11:08 AM 02/10/2020    3:43 PM 12/30/2018    5:20 PM 06/05/2018    8:30 AM 05/20/2018   11:20 AM  Advanced Directives  Does Patient Have a Medical Advance Directive? Yes Yes Yes Yes No No  Type of Estate agent of [place];Living will Healthcare Power of [place];Living will Healthcare Power of [place];Living will Healthcare Power of [place];Living will    Does patient want to make changes to medical advance directive?  Yes (MAU/Ambulatory/Procedural Areas - Information given)      Copy of Healthcare Power of Attorney in Chart? No - copy requested  No - copy requested No - copy requested    Would patient like information on creating a medical advance directive?     No - Patient declined No - Patient declined    Current Medications (verified) Outpatient Encounter Medications as of 10/31/2022  Medication Sig   acetaminophen (TYLENOL) 500 MG tablet Take 500 mg by mouth as needed for mild pain.   Alirocumab (PRALUENT) 150 MG/ML SOAJ Inject 150 mg into the skin every 14 (fourteen) days.   amLODipine-benazepril (LOTREL) 5-20 MG capsule Take 1 capsule by mouth daily. For blood pressure.   aspirin EC 81 MG tablet Take 81 mg by mouth daily.   b complex vitamins capsule Take 1 capsule by mouth daily.   brimonidine (ALPHAGAN) 0.2 % ophthalmic solution 1 drop 2 (two) times daily.   Calcium Carbonate-Vit D-Min (CALCIUM 1200 PO) Take by mouth.   cetirizine (ZYRTEC) 10 MG tablet Take 10 mg by mouth daily.    cholecalciferol (VITAMIN D) 1000 units tablet Take 1,000 Units by mouth daily.   dorzolamide-timolol (COSOPT) 22.3-6.8 MG/ML ophthalmic solution Place 1 drop into both eyes 2 (two) times daily.   FLUoxetine (PROZAC) 20 MG capsule Take 1 capsule (20 mg total) by mouth daily. For anxiety   MAGNESIUM PO Take by mouth.   Multiple Vitamin (MULTIVITAMIN) tablet Take 1 tablet by mouth daily.   OVER THE COUNTER MEDICATION Blue - Emu   OVER THE COUNTER MEDICATION Bio true   traZODone (DESYREL) 50 MG tablet Take 1 tablet (50 mg total) by mouth at bedtime as needed for sleep.   benzonatate (TESSALON) 100 MG capsule Take 1 capsule by mouth every 8 (eight) hours for cough. (Patient not taking: Reported on 10/31/2022)   Coenzyme Q10 (COQ10 PO) Take 300 mg by mouth daily. (Patient not taking: Reported on 10/31/2022)   cyanocobalamin (VITAMIN B12) 1000 MCG tablet Take 1,000 mcg by mouth daily. (Patient not taking: Reported on 10/31/2022)   latanoprost (XALATAN) 0.005 % ophthalmic solution Place 1 drop into both eyes at bedtime. (Patient not taking: Reported on 10/31/2022)   Olopatadine HCl (PATADAY OP) Apply to eye. (Patient not taking: Reported on 10/31/2022)   Propylene Glycol (SYSTANE BALANCE OP) Apply to eye. (Patient not taking: Reported on 10/31/2022)   trolamine salicylate (ASPERCREME) 10 % cream Apply 1 application topically as needed  for muscle pain. (Patient not taking: Reported on 10/31/2022)   No facility-administered encounter medications on file as of 10/31/2022.    Allergies (verified) Amitriptyline, Codeine, Crestor [rosuvastatin], Lipitor [atorvastatin], and Prednisone   History: Past Medical History:  Diagnosis Date   Allergy    seasonal   Anxiety    Aortic aneurysm 08/2018   Arthritis    Blood clotting disorder    Bone spur    with excision   Chickenpox    Colon polyps    DVT (deep venous thrombosis)    Glaucoma    both   Graves disease 03/30/2013   History of blood clots 10/2017    Hyperlipemia    Hypertension    Osteopenia    Prediabetes    Restless leg syndrome    Past Surgical History:  Procedure Laterality Date   BREAST CYST ASPIRATION     CATARACT EXTRACTION W/ INTRAOCULAR LENS IMPLANT Right 2015   CATARACT EXTRACTION W/PHACO Left 2017   COLONOSCOPY  2014   DILATION AND CURETTAGE OF UTERUS     Family History  Problem Relation Age of Onset   Heart disease Mother    Heart attack Father    Breast cancer Sister [id number]   Colon polyps Sister    Aneurysm Brother    Multiple myeloma Sister        found in stage 4   Breast cancer Maternal Grandmother    Esophageal cancer Maternal Aunt 82   Colon cancer Neg Hx    Rectal cancer Neg Hx    Stomach cancer Neg Hx    Social History   Socioeconomic History   Marital status: Married    Spouse name: Not on file   Number of children: Not on file   Years of education: Not on file   Highest education level: Not on file  Occupational History   Occupation: Retired  Tobacco Use   Smoking status: Former    Types: Cigarettes    Quit date: 1990    Years since quitting: 34.2   Smokeless tobacco: Never  Vaping Use   Vaping Use: Never used  Substance and Sexual Activity   Alcohol use: Yes    Alcohol/week: 2.0 standard drinks of alcohol    Types: 2 Standard drinks or equivalent per week   Drug use: Not Currently   Sexual activity: Not Currently  Other Topics Concern   Not on file  Social History Narrative   [place] in [place], moving to [place]   Retired.   Married.    Social Determinants of Health   Financial Resource Strain: Low Risk  (10/31/2022)   Overall Financial Resource Strain (CARDIA)    Difficulty of Paying Living Expenses: Not hard at all  Food Insecurity: No Food Insecurity (10/31/2022)   Hunger Vital Sign    Worried About Running Out of Food in the Last Year: Never true    Ran Out of Food in the Last Year: Never true  Transportation Needs: No Transportation Needs (10/31/2022)   PRAPARE -  Administrator, Civil ServiceTransportation    Lack of Transportation (Medical): No    Lack of Transportation (Non-Medical): No  Physical Activity: Inactive (10/31/2022)   Exercise Vital Sign    Days of Exercise per Week: 0 days    Minutes of Exercise per Session: 0 min  Stress: No Stress Concern Present (10/31/2022)   [employer] - Occupational Stress Questionnaire    Feeling of Stress : Not at all  Social Connections: Socially Integrated (  10/31/2022)   Social Connection and Isolation Panel [NHANES]    Frequency of Communication with Friends and Family: More than three times a week    Frequency of Social Gatherings with Friends and Family: More than three times a week    Attends Religious Services: More than 4 times per year    Active Member of [employer] or Organizations: Yes    Attends Engineer, structural: More than 4 times per year    Marital Status: Married    Tobacco Counseling Counseling given: Not Answered   Clinical Intake:  Pre-visit preparation completed: Yes  Pain : No/denies pain     Nutritional Risks: Nausea/ vomitting/ diarrhea (occasional loose stools) Diabetes: No  How often do you need to have someone help you when you read instructions, pamphlets, or other written materials from your doctor or pharmacy?: 1 - Never  Diabetic? no  Interpreter Needed?: No  Information entered by :: Patina Foy CMA   Activities of Daily Living    10/31/2022    3:37 PM 02/20/2022    2:24 PM  In your present state of health, do you have any difficulty performing the following activities:  Hearing? 0 0  Vision? 0 0  Difficulty concentrating or making decisions? 1 0  Comment occassionally forgets   Walking or climbing stairs? 0 0  Dressing or bathing? 0 0  Doing errands, shopping? 0 0  Preparing Food and eating ? N   Using the Toilet? N   In the past six months, have you accidently leaked urine? N   Do you have problems with loss of bowel control? N   Managing  your Medications? N   Managing your Finances? N   Housekeeping or managing your Housekeeping? N     Patient Care Team: Doreene Nest, NP as PCP - General (Internal Medicine) Parke Poisson, MD as PCP - Cardiology (Cardiology)  Indicate any recent Medical Services you may have received from other than [facility] providers in the past year (date may be approximate).     Assessment:   This is a routine wellness examination for Thornton.  Hearing/Vision screen Hearing Screening - Comments:: No aids Vision Screening - Comments:: Readers-  Dr. Wynelle Link and Dr. Cherlynn Polo  Dietary issues and exercise activities discussed: Current Exercise Habits: The patient does not participate in regular exercise at present, Exercise limited by: None identified   Goals Addressed             This Visit's Progress    Patient Stated       Loose weight 20lbs       Depression Screen    10/31/2022    3:31 PM 02/20/2022    2:24 PM 10/24/2021   11:10 AM 02/14/2021    2:09 PM 02/10/2020    3:45 PM 12/30/2018    5:19 PM  PHQ 2/9 Scores  PHQ - 2 Score 0 0 0 0 0 0  PHQ- 9 Score  0  4 0 0    Fall Risk    10/31/2022    3:36 PM 02/20/2022    2:25 PM 10/24/2021   11:09 AM 02/14/2021    2:08 PM 02/10/2020    3:44 PM  Fall Risk   Falls in the past year? 0 0 0 0 0  Number falls in past yr: 0 0 0 0 0  Injury with Fall? 0 0 0 0 0  Risk for fall due to : No Fall Risks  No Fall Risks  No Fall Risks  Follow up Falls prevention discussed;Falls evaluation completed  Falls prevention discussed Falls evaluation completed Falls evaluation completed;Falls prevention discussed    FALL RISK PREVENTION PERTAINING TO THE HOME:  Any stairs in or around the home? No  If so, are there any without handrails? No  Home free of loose throw rugs in walkways, pet beds, electrical cords, etc? Yes  Adequate lighting in your home to reduce risk of falls? Yes   ASSISTIVE DEVICES UTILIZED TO PREVENT FALLS:  Life alert? No  Use of a  cane, walker or w/c? No  Grab bars in the bathroom? Yes  Shower chair or bench in shower? Yes  Elevated toilet seat or a handicapped toilet? Yes   TIMED UP AND GO:  Was the test performed? Yes .  Length of time to ambulate 10 feet: 30 sec.   Gait steady and fast without use of assistive device  Cognitive Function:    02/10/2020    3:47 PM 12/30/2018    5:20 PM  MMSE - Mini Mental State Exam  Orientation to time 5 5  Orientation to Place 5 5  Registration 3 3  Attention/ Calculation 5 0  Recall 3 3  Language- name 2 objects  0  Language- repeat 1 1  Language- follow 3 step command  0  Language- read & follow direction  0  Write a sentence  0  Copy design  0  Total score  17        10/31/2022    3:38 PM  6CIT Screen  What Year? 0 points  What month? 0 points  What time? 0 points  Count back from 20 0 points  Months in reverse 0 points  Repeat phrase 4 points  Total Score 4 points    Immunizations Immunization History  Administered Date(s) Administered   Fluad Quad(high Dose 65+) 03/31/2019   Influenza, High Dose Seasonal PF 04/27/2014, 05/01/2015, 05/02/2016, 05/06/2017, 04/22/2018   Influenza-Unspecified 03/31/2019, 04/24/2021   PFIZER(Purple Top)SARS-COV-2 Vaccination 09/18/2019, 10/05/2019, 05/05/2020, 11/03/2020, 05/15/2021   Pneumococcal Conjugate-13 04/27/2015, 02/12/2016   Pneumococcal Polysaccharide-23 06/20/2014   Tdap 06/19/2016   Zoster Recombinat (Shingrix) 08/03/2020, 10/03/2020   Zoster, Live 02/18/2013    TDAP status: Up to date  Flu Vaccine status: Up to date [facility]  Pneumococcal vaccine status: Up to date  Covid-19 vaccine status: Information provided on how to obtain vaccines.   Qualifies for Shingles Vaccine? Yes   Zostavax completed Yes   Shingrix Completed?: Yes  Screening Tests Health Maintenance  Topic Date Due   COVID-19 Vaccine (6 - 2023-24 season) 03/22/2022   INFLUENZA VACCINE  02/20/2023   Medicare Annual Wellness  (AWV)  10/31/2023   DTaP/Tdap/Td (2 - Td or Tdap) 06/19/2026   Pneumonia Vaccine 84+ Years old  Completed   DEXA SCAN  Completed   Hepatitis C Screening  Completed   Zoster Vaccines- Shingrix  Completed   HPV VACCINES  Aged Out   COLONOSCOPY (Pts 45-33yrs Insurance coverage will need to be confirmed)  Discontinued    Health Maintenance  Health Maintenance Due  Topic Date Due   COVID-19 Vaccine (6 - 2023-24 season) 03/22/2022    Colorectal cancer screening: No longer required.   Mammogram status: Completed 10/24/21. Repeat every year pt will call to set up appt  Bone Density status: Completed 12/19/20. Results reflect: Bone density results: OSTEOPENIA. Repeat every 2 years.  Lung Cancer Screening: (Low Dose CT Chest recommended if Age 64-80 years, 30 pack-year currently smoking  OR have quit w/in 15years.) does not qualify.   Lung Cancer Screening Referral: no  Additional Screening:  Hepatitis C Screening: does qualify; Completed 01/25/19  Vision Screening: Recommended annual ophthalmology exams for early detection of glaucoma and other disorders of the eye. Is the patient up to date with their annual eye exam?  Yes  Who is the provider or what is the name of the office in which the patient attends annual eye exams? Dr. Fredderick Phenix and Dr. Cherlynn Polo If pt is not established with a provider, would they like to be referred to a provider to establish care? No .   Dental Screening: Recommended annual dental exams for proper oral hygiene  Community Resource Referral / Chronic Care Management: CRR required this visit?  No   CCM required this visit?  No      Plan:     I have personally reviewed and noted the following in the patient's chart:   Medical and social history Use of alcohol, tobacco or illicit drugs  Current medications and supplements including opioid prescriptions. Patient is not currently taking opioid prescriptions. Functional ability and status Nutritional  status Physical activity Advanced directives List of other physicians Hospitalizations, surgeries, and ER visits in previous 12 months Vitals Screenings to include cognitive, depression, and falls Referrals and appointments  In addition, I have reviewed and discussed with patient certain preventive protocols, quality metrics, and best practice recommendations. A written personalized care plan for preventive services as well as general preventive health recommendations were provided to patient.     Maryan Puls, LPN   6/81/2751   Nurse Notes: Pt complained of hallucinations. States has seen spiders and a snake crawling on arm. Patient also complain of itching and dry skin, currently followed by dermatology Patient states that she will call to set up an appointment for evaluation.

## 2022-10-31 NOTE — Patient Instructions (Signed)
Marie Ruiz , Thank you for taking time to come for your Medicare Wellness Visit. I appreciate your ongoing commitment to your health goals. Please review the following plan we discussed and let me know if I can assist you in the future.   These are the goals we discussed:  Goals      Patient Stated     Starting 12/30/18, I will continue to take medications as prescribed.      Patient Stated     02/10/2020, I will maintain and continue medications as prescribed.      Patient Stated     Would like start going to the gym again.      Patient Stated     Loose weight 20lbs        This is a list of the screening recommended for you and due dates:  Health Maintenance  Topic Date Due   COVID-19 Vaccine (6 - 2023-24 season) 03/22/2022   Flu Shot  02/20/2023   Medicare Annual Wellness Visit  10/31/2023   DTaP/Tdap/Td vaccine (2 - Td or Tdap) 06/19/2026   Pneumonia Vaccine  Completed   DEXA scan (bone density measurement)  Completed   Hepatitis C Screening: USPSTF Recommendation to screen - Ages 45-79 yo.  Completed   Zoster (Shingles) Vaccine  Completed   HPV Vaccine  Aged Out   Colon Cancer Screening  Discontinued    Advanced directives: Please bring a copy of your health care power of attorney and living will to the office to be added to your chart at your convenience.   Conditions/risks identified: Aim for 30 minutes of exercise or brisk walking, 6-8 glasses of water, and 5 servings of fruits and vegetables each day.   Next appointment: Follow up in one year for your annual wellness visit 11/04/23 @ 3:00 televisit   Preventive Care 65 Years and Older, Female Preventive care refers to lifestyle choices and visits with your health care provider that can promote health and wellness. What does preventive care include? A yearly physical exam. This is also called an annual well check. Dental exams once or twice a year. Routine eye exams. Ask your health care provider how often you  should have your eyes checked. Personal lifestyle choices, including: Daily care of your teeth and gums. Regular physical activity. Eating a healthy diet. Avoiding tobacco and drug use. Limiting alcohol use. Practicing safe sex. Taking low-dose aspirin every day. Taking vitamin and mineral supplements as recommended by your health care provider. What happens during an annual well check? The services and screenings done by your health care provider during your annual well check will depend on your age, overall health, lifestyle risk factors, and family history of disease. Counseling  Your health care provider may ask you questions about your: Alcohol use. Tobacco use. Drug use. Emotional well-being. Home and relationship well-being. Sexual activity. Eating habits. History of falls. Memory and ability to understand (cognition). Work and work Astronomer. Reproductive health. Screening  You may have the following tests or measurements: Height, weight, and BMI. Blood pressure. Lipid and cholesterol levels. These may be checked every 5 years, or more frequently if you are over 33 years old. Skin check. Lung cancer screening. You may have this screening every year starting at age 14 if you have a 30-pack-year history of smoking and currently smoke or have quit within the past 15 years. Fecal occult blood test (FOBT) of the stool. You may have this test every year starting at age 60. Flexible  sigmoidoscopy or colonoscopy. You may have a sigmoidoscopy every 5 years or a colonoscopy every 10 years starting at age 77. Hepatitis C blood test. Hepatitis B blood test. Sexually transmitted disease (STD) testing. Diabetes screening. This is done by checking your blood sugar (glucose) after you have not eaten for a while (fasting). You may have this done every 1-3 years. Bone density scan. This is done to screen for osteoporosis. You may have this done starting at age 77. Mammogram. This may  be done every 1-2 years. Talk to your health care provider about how often you should have regular mammograms. Talk with your health care provider about your test results, treatment options, and if necessary, the need for more tests. Vaccines  Your health care provider may recommend certain vaccines, such as: Influenza vaccine. This is recommended every year. Tetanus, diphtheria, and acellular pertussis (Tdap, Td) vaccine. You may need a Td booster every 10 years. Zoster vaccine. You may need this after age 77. Pneumococcal 13-valent conjugate (PCV13) vaccine. One dose is recommended after age 77. Pneumococcal polysaccharide (PPSV23) vaccine. One dose is recommended after age 77. Talk to your health care provider about which screenings and vaccines you need and how often you need them. This information is not intended to replace advice given to you by your health care provider. Make sure you discuss any questions you have with your health care provider. Document Released: 08/04/2015 Document Revised: 03/27/2016 Document Reviewed: 05/09/2015 Elsevier Interactive Patient Education  2017 [employer]  Fall Prevention in the Home Falls can cause injuries. They can happen to people of all ages. There are many things you can do to make your home safe and to help prevent falls. What can I do on the outside of my home? Regularly fix the edges of walkways and driveways and fix any cracks. Remove anything that might make you trip as you walk through a door, such as a raised step or threshold. Trim any bushes or trees on the path to your home. Use bright outdoor lighting. Clear any walking paths of anything that might make someone trip, such as rocks or tools. Regularly check to see if handrails are loose or broken. Make sure that both sides of any steps have handrails. Any raised decks and porches should have guardrails on the edges. Have any leaves, snow, or ice cleared regularly. Use sand or salt  on walking paths during winter. Clean up any spills in your garage right away. This includes oil or grease spills. What can I do in the bathroom? Use night lights. Install grab bars by the toilet and in the tub and shower. Do not use towel bars as grab bars. Use non-skid mats or decals in the tub or shower. If you need to sit down in the shower, use a plastic, non-slip stool. Keep the floor dry. Clean up any water that spills on the floor as soon as it happens. Remove soap buildup in the tub or shower regularly. Attach bath mats securely with double-sided non-slip rug tape. Do not have throw rugs and other things on the floor that can make you trip. What can I do in the bedroom? Use night lights. Make sure that you have a light by your bed that is easy to reach. Do not use any sheets or blankets that are too big for your bed. They should not hang down onto the floor. Have a firm chair that has side arms. You can use this for support while you get dressed.  Do not have throw rugs and other things on the floor that can make you trip. What can I do in the kitchen? Clean up any spills right away. Avoid walking on wet floors. Keep items that you use a lot in easy-to-reach places. If you need to reach something above you, use a strong step stool that has a grab bar. Keep electrical cords out of the way. Do not use floor polish or wax that makes floors slippery. If you must use wax, use non-skid floor wax. Do not have throw rugs and other things on the floor that can make you trip. What can I do with my stairs? Do not leave any items on the stairs. Make sure that there are handrails on both sides of the stairs and use them. Fix handrails that are broken or loose. Make sure that handrails are as long as the stairways. Check any carpeting to make sure that it is firmly attached to the stairs. Fix any carpet that is loose or worn. Avoid having throw rugs at the top or bottom of the stairs. If you  do have throw rugs, attach them to the floor with carpet tape. Make sure that you have a light switch at the top of the stairs and the bottom of the stairs. If you do not have them, ask someone to add them for you. What else can I do to help prevent falls? Wear shoes that: Do not have high heels. Have rubber bottoms. Are comfortable and fit you well. Are closed at the toe. Do not wear sandals. If you use a stepladder: Make sure that it is fully opened. Do not climb a closed stepladder. Make sure that both sides of the stepladder are locked into place. Ask someone to hold it for you, if possible. Clearly mark and make sure that you can see: Any grab bars or handrails. First and last steps. Where the edge of each step is. Use tools that help you move around (mobility aids) if they are needed. These include: Canes. Walkers. Scooters. Crutches. Turn on the lights when you go into a dark area. Replace any light bulbs as soon as they burn out. Set up your furniture so you have a clear path. Avoid moving your furniture around. If any of your floors are uneven, fix them. If there are any pets around you, be aware of where they are. Review your medicines with your doctor. Some medicines can make you feel dizzy. This can increase your chance of falling. Ask your doctor what other things that you can do to help prevent falls. This information is not intended to replace advice given to you by your health care provider. Make sure you discuss any questions you have with your health care provider. Document Released: 05/04/2009 Document Revised: 12/14/2015 Document Reviewed: 08/12/2014 Elsevier Interactive Patient Education  2017 [employer].

## 2022-11-04 ENCOUNTER — Other Ambulatory Visit: Payer: Self-pay | Admitting: Primary Care

## 2022-11-04 ENCOUNTER — Telehealth: Payer: Self-pay | Admitting: Primary Care

## 2022-11-04 DIAGNOSIS — E2839 Other primary ovarian failure: Secondary | ICD-10-CM

## 2022-11-04 DIAGNOSIS — Z1231 Encounter for screening mammogram for malignant neoplasm of breast: Secondary | ICD-10-CM

## 2022-11-04 NOTE — Telephone Encounter (Signed)
Noted, orders placed for bone density scan.

## 2022-11-04 NOTE — Telephone Encounter (Signed)
Pt called requesting a referral to get a bone density done? Pt states she has a appt for a mammogram on 12/11/22 & the office requiring a referral for bone density. Office is [facility]. Call back # [phone]

## 2022-11-04 NOTE — Addendum Note (Signed)
Addended by: Doreene Nest on: 11/04/2022 02:43 PM   Modules accepted: Orders

## 2022-11-04 NOTE — Telephone Encounter (Signed)
Left message on voicemail per dpr advising patient orders have been placed.

## 2022-11-19 ENCOUNTER — Other Ambulatory Visit: Payer: Self-pay

## 2022-11-19 DIAGNOSIS — Z79899 Other long term (current) drug therapy: Secondary | ICD-10-CM | POA: Diagnosis not present

## 2022-11-19 DIAGNOSIS — E782 Mixed hyperlipidemia: Secondary | ICD-10-CM

## 2022-11-19 NOTE — Progress Notes (Signed)
Lipids order placed per notes.

## 2022-11-20 LAB — LIPID PANEL
Chol/HDL Ratio: 2.8 ratio (ref 0.0–4.4)
Cholesterol, Total: 187 mg/dL (ref 100–199)
HDL: 68 mg/dL (ref >39)
LDL Chol Calc (NIH): 93 mg/dL (ref 0–99)
Triglycerides: 152 mg/dL — ABNORMAL HIGH (ref 0–149)
VLDL Cholesterol Cal: 26 mg/dL (ref 5–40)

## 2022-11-21 ENCOUNTER — Telehealth: Payer: Self-pay | Admitting: Internal Medicine

## 2022-11-21 NOTE — Telephone Encounter (Signed)
Patient calling to speak to the nurse. Please advise  

## 2022-11-21 NOTE — Telephone Encounter (Signed)
Patient ask for labs once reviewed to be called to her.  She still cannot get in My chart

## 2022-11-27 NOTE — Telephone Encounter (Signed)
Parke Poisson, MD  You6 days ago    Trig and LDL have improved only slightly since last check in January and are not at goal, has she had more difficulty getting Praluent? what therapy is she on now? Please let patient know we are happy to refill anytime she is out and if she is having trouble getting refills please call the office or write to [place] and not go without medication. She is due for 6 mo follow up at this time, please add her to my schedule on May 13 in an open slot so we can address any concerns. GA   Returned call to patient and made her aware of the above. Patient currently is taking Praulent as prescribed. (Only therapy for cholesterol)  Scheduled patient to see Dr. Jacques Navy next week 5/13. Patient aware of appointment time and date.   Advised patient to call back to office with any issues, questions, or concerns. Patient verbalized understanding.

## 2022-12-02 ENCOUNTER — Ambulatory Visit: Payer: Medicare Other | Attending: Internal Medicine | Admitting: Internal Medicine

## 2022-12-02 ENCOUNTER — Encounter: Payer: Self-pay | Admitting: Internal Medicine

## 2022-12-02 VITALS — BP 112/76 | HR 63 | Ht 65.0 in | Wt 198.4 lb

## 2022-12-02 DIAGNOSIS — I77819 Aortic ectasia, unspecified site: Secondary | ICD-10-CM | POA: Diagnosis not present

## 2022-12-02 DIAGNOSIS — R072 Precordial pain: Secondary | ICD-10-CM

## 2022-12-02 DIAGNOSIS — I10 Essential (primary) hypertension: Secondary | ICD-10-CM

## 2022-12-02 DIAGNOSIS — E782 Mixed hyperlipidemia: Secondary | ICD-10-CM

## 2022-12-02 DIAGNOSIS — I700 Atherosclerosis of aorta: Secondary | ICD-10-CM

## 2022-12-02 DIAGNOSIS — I82432 Acute embolism and thrombosis of left popliteal vein: Secondary | ICD-10-CM | POA: Diagnosis not present

## 2022-12-02 NOTE — Progress Notes (Signed)
Cardiology Office Note:    Date:  12/02/2022   ID:  Marie Ruiz, DOB 13-Nov-1945, MRN [id number]  PCP:  Doreene Nest, NP  Cardiologist:  Parke Poisson, MD  Electrophysiologist:  None   Referring MD: Doreene Nest, NP   Chief Complaint/Reason for Referral: Follow up testing.  History of Present Illness:    Marie Ruiz is a 77 y.o. female with a history of aortic aneurysm, prior positive lupus anticoagulant with normalization off of anticoagulation, DVT felt to be provoked 10/2017, hypertension, hyperlipidemia, and prediabetes coming in today for follow-up of her CCTA.   12/02/22: Overall she is feeling well.  We discussed her borderline ascending aortic dilation which is really by echo mild and by CT likely upper limit of normal.  We discussed that CT is the more accurate technology.  Since she is concerned, would recommend echocardiogram to reassess.  She has been on Praluent without interruption since running out earlier this year.  She states that it was difficult to get her refills from the office but now has a 72-month supply.  No chest pain or shortness of breath is noted.  Prior visits:  She is doing well. We discussed the results of her CCTA, no plaque or coronary calcification. Normal coronary CTA.   Her leg discomfort resolved off of crestor. She does not take a blood thinner for her history of DVT, as it was felt to be provoked after a long car trip. However, she is on 81 mg aspirin, self started. The DVT occurred behind her L knee 4 years ago after driving. She still experiences L knee pain from arthritis. She did see a hematologist Dr. Melton Alar in 12/2018. After the patient's visit today I have reviewed these records in detail. She was noted to have a positive lupus anticoagulant. Negative Factor V Leiden, Prothrombin gene mutation and normal B2 Glycoprotein.  She was diagnosed with left posterior tibial and peroneal vein DVT in April 2019 at which time  she was on Xarelto.  Repeat lower extremity Dopplers in 04/2018 were consistent with chronic deep vein thrombosis but improved from initial diagnosis.  Hypercoagulable work-up performed while on anticoagulation, as noted above.  Hematology reviewed and recommended repeat hypercoagulable work-up off of anticoagulation.  She had a negative lupus anticoagulant and normal range DRVVT in October 2020.  She reports occasionally experiencing bilateral LE edema. We discussed compression socks and venous insufficiency.   For formal exercise, she wants to join [employer] and already attends as her husbands guest. Currently, she only goes when her husband wants to. She prefers to use the bike than walking normally but does well on the treadmill as well.  The patient denies chest pain, chest pressure, dyspnea at rest or with exertion, PND, or orthopnea. Denies cough, fever, chills, nausea, or vomiting. Denies syncope, presyncope, or snoring. Denies dizziness or lightheadedness.    Past Medical History:  Diagnosis Date   Allergy    seasonal   Anxiety    Aortic aneurysm (HCC) 08/2018   Arthritis    Blood clotting disorder (HCC)    Bone spur    with excision   Chickenpox    Colon polyps    DVT (deep venous thrombosis) (HCC)    Glaucoma    both   Graves disease 03/30/2013   History of blood clots 10/2017   Hyperlipemia    Hypertension    Osteopenia    Prediabetes    Restless leg syndrome  Past Surgical History:  Procedure Laterality Date   BREAST CYST ASPIRATION     CATARACT EXTRACTION W/ INTRAOCULAR LENS IMPLANT Right 2015   CATARACT EXTRACTION W/PHACO Left 2017   COLONOSCOPY  2014   DILATION AND CURETTAGE OF UTERUS      Current Medications: Current Meds  Medication Sig   acetaminophen (TYLENOL) 500 MG tablet Take 500 mg by mouth as needed for mild pain.   Alirocumab (PRALUENT) 150 MG/ML SOAJ Inject 150 mg into the skin every 14 (fourteen) days.   amLODipine-benazepril (LOTREL)  5-20 MG capsule Take 1 capsule by mouth daily. For blood pressure.   aspirin EC 81 MG tablet Take 81 mg by mouth daily.   b complex vitamins capsule Take 1 capsule by mouth daily.   benzonatate (TESSALON) 100 MG capsule Take 1 capsule by mouth every 8 (eight) hours for cough.   brimonidine (ALPHAGAN) 0.2 % ophthalmic solution 1 drop 2 (two) times daily.   Calcium Carbonate-Vit D-Min (CALCIUM 1200 PO) Take by mouth.   cetirizine (ZYRTEC) 10 MG tablet Take 10 mg by mouth daily.   cholecalciferol (VITAMIN D) 1000 units tablet Take 1,000 Units by mouth daily.   Coenzyme Q10 (COQ10 PO) Take 300 mg by mouth daily.   cyanocobalamin (VITAMIN B12) 1000 MCG tablet Take 1,000 mcg by mouth daily.   dorzolamide-timolol (COSOPT) 22.3-6.8 MG/ML ophthalmic solution Place 1 drop into both eyes 2 (two) times daily.   FLUoxetine (PROZAC) 20 MG capsule Take 1 capsule (20 mg total) by mouth daily. For anxiety   latanoprost (XALATAN) 0.005 % ophthalmic solution Place 1 drop into both eyes at bedtime.   MAGNESIUM PO Take by mouth.   Multiple Vitamin (MULTIVITAMIN) tablet Take 1 tablet by mouth daily.   Olopatadine HCl (PATADAY OP) Apply to eye.   OVER THE COUNTER MEDICATION Blue - Emu   OVER THE COUNTER MEDICATION Bio true   Propylene Glycol (SYSTANE BALANCE OP) Apply to eye.   traZODone (DESYREL) 50 MG tablet Take 1 tablet (50 mg total) by mouth at bedtime as needed for sleep.   trolamine salicylate (ASPERCREME) 10 % cream Apply 1 application  topically as needed for muscle pain.     Allergies:   Amitriptyline, Codeine, Crestor [rosuvastatin], Lipitor [atorvastatin], and Prednisone   Social History   Tobacco Use   Smoking status: Former    Types: Cigarettes    Quit date: 1990    Years since quitting: 34.3   Smokeless tobacco: Never  Vaping Use   Vaping Use: Never used  Substance Use Topics   Alcohol use: Yes    Alcohol/week: 2.0 standard drinks of alcohol    Types: 2 Standard drinks or equivalent per  week   Drug use: Not Currently     Family History: The patient's family history includes Aneurysm in her brother; Breast cancer in her maternal grandmother; Breast cancer (age of onset: 81) in her sister; Colon polyps in her sister; Esophageal cancer (age of onset: 17) in her maternal aunt; Heart attack in her father; Heart disease in her mother; Multiple myeloma in her sister. There is no history of Colon cancer, Rectal cancer, or Stomach cancer.  ROS:   Please see the history of present illness.    All other systems reviewed and are negative.  EKGs/Labs/Other Studies Reviewed:    The following studies were reviewed today: CCTA 10/18/222 1. No evidence of CAD, CADRADS = 0. 2. Coronary calcium score of 0. 3. Normal coronary origins with right dominance. 4. Borderline  ascending aorta dimension, upper limit of normal 39 mm at mid ascending aorta.  Echo 08/28/20 1. Ascending aorta measures 40 mm which is likely normal for age. Stable  with CTA 04/06/2019.   2. Left ventricular ejection fraction, by estimation, is 55 to 60%. The  left ventricle has normal function. The left ventricle has no regional  wall motion abnormalities. There is moderate concentric left ventricular  hypertrophy. Left ventricular  diastolic parameters are consistent with Grade I diastolic dysfunction  (impaired relaxation).   3. Right ventricular systolic function is normal. The right ventricular  size is normal. Tricuspid regurgitation signal is inadequate for assessing  PA pressure.   4. The mitral valve is grossly normal. No evidence of mitral valve  regurgitation. No evidence of mitral stenosis.   5. The aortic valve is tricuspid. Aortic valve regurgitation is not  visualized. No aortic stenosis is present.   6. The inferior vena cava is normal in size with greater than 50%  respiratory variability, suggesting right atrial pressure of 3 mmHg.   EKG:   12/02/2022: Sinus rhythm LVH 05/17/21: Sinus rhythm,  rate 61 bpm 09/01/20: NSR  Imaging studies that I have independently reviewed today: coronary CTA 05/08/21  Recent Labs: 02/20/2022: ALT 14; BUN 10; Creatinine, Ser 0.80; Hemoglobin 13.7; Platelets 290.0; Potassium 4.1; Sodium 137; TSH 1.29  Recent Lipid Panel    Component Value Date/Time   CHOL 187 11/19/2022 1354   TRIG 152 (H) 11/19/2022 1354   HDL 68 11/19/2022 1354   CHOLHDL 2.8 11/19/2022 1354   CHOLHDL 3 02/20/2022 1507   VLDL 36.2 02/20/2022 1507   LDLCALC 93 11/19/2022 1354    Physical Exam:    VS:  BP 112/76 (BP Location: Right Arm, Patient Position: Sitting, Cuff Size: Normal)   Pulse 63   Ht 5\' 5"  (1.651 m)   Wt 198 lb 6.4 oz (90 kg)   SpO2 97%   BMI 33.02 kg/m     Wt Readings from Last 5 Encounters:  12/02/22 198 lb 6.4 oz (90 kg)  10/31/22 198 lb 3.2 oz (89.9 kg)  02/20/22 197 lb (89.4 kg)  12/27/21 199 lb (90.3 kg)  10/24/21 192 lb (87.1 kg)    Constitutional: No acute distress Eyes: sclera non-icteric, normal conjunctiva and lids ENMT: normal dentition, moist mucous membranes Cardiovascular: regular rhythm, normal rate, no murmurs. S1 and S2 normal. Radial pulses normal bilaterally. No jugular venous distention.  Respiratory: clear to auscultation bilaterally GI : normal bowel sounds, soft and nontender. No distention.   MSK: extremities warm, well perfused. No edema.  NEURO: grossly nonfocal exam, moves all extremities. PSYCH: alert and oriented x 3, normal mood and affect.   ASSESSMENT:    1. Hypertension, unspecified type   2. Mixed hyperlipidemia   3. Aortic atherosclerosis (HCC)   4. Precordial pain   5. Essential hypertension   6. Dilation of aorta (HCC)   7. Deep vein thrombosis (DVT) of popliteal vein of left lower extremity, unspecified chronicity (HCC)     PLAN:    Mixed hyperlipidemia - -she had previously been on a statin but stopped for leg discomfort though now she feels that the leg discomfort may not have been changed at all by  stopping the statin.  She has been continued on Praluent and has been doing well except for an interruption in her prescription refill, I have asked her to call if she is in risk of running out of her medications without a refill so that we  are aware and can refill promptly.  For now continue Praluent as she has a 30-month supply.  Can repeat labs with her primary care provider at next visit, goal LDL is less than 70, however with no coronary artery calcifications and no coronary artery disease, lenient goal may be reasonable.  Precordial pain-no recurrence since last visit.  Normal coronary CTA.  Essential hypertension-blood pressure is well controlled today, continues on amlodipine-benazepril 5-20 mg daily, continue at current dose.  Dilation of aorta (HCC)-she has a borderline measurement of her ascending aorta, 39 mm.  We discussed that this likely does not have to be followed as closely given stability over time.  We discussed that this does not represent aneurysm formation. Unlikely to need repeat cross-sectional imaging for several years, will consider repeat echo now as it has been 2 years.  Prior DVT-hypercoagulable work-up off of anticoagulation for prior provoked DVT showed normalization of her lupus anticoagulant.  She was taken off of anticoagulation by hematology and not restarted.  With normal coronary CTA she does not likely need a 81 mg aspirin for primary prevention of CAD nor does she need it for prior DVT.  LE edema- Compression socks for LE edema  Total time of encounter: 30 minutes total time of encounter, including 25 minutes spent in face-to-face patient care on the date of this encounter. This time includes coordination of care and counseling regarding above mentioned problem list. Remainder of non-face-to-face time involved reviewing chart documents/testing relevant to the patient encounter and documentation in the medical record. I have independently reviewed documentation  from referring provider.   Weston Brass, MD, [facility]  [facility]   Shared Decision Making/Informed Consent:       Medication Adjustments/Labs and Tests Ordered: Current medicines are reviewed at length with the patient today.  Concerns regarding medicines are outlined above.   Orders Placed This Encounter  Procedures   EKG 12-Lead   ECHOCARDIOGRAM COMPLETE     No orders of the defined types were placed in this encounter.    Patient Instructions  Medication Instructions:  Your physician recommends that you continue on your current medications as directed. Please refer to the Current Medication list given to you today.  *If you need a refill on your cardiac medications before your next appointment, please call your pharmacy*   Lab Work: NONE ordered at this time of appointment   Testing/Procedures: Your physician has requested that you have an echocardiogram. Echocardiography is a painless test that uses sound waves to create images of your heart. It provides your doctor with information about the size and shape of your heart and how well your heart's chambers and valves are working. This procedure takes approximately one hour. There are no restrictions for this procedure. Please do NOT wear cologne, perfume, aftershave, or lotions (deodorant is allowed). Please arrive 15 minutes prior to your appointment time.    Follow-Up: At [facility], you and your health needs are our priority.  As part of our continuing mission to provide you with exceptional heart care, we have created designated Provider Care Teams.  These Care Teams include your primary Cardiologist (physician) and Advanced Practice Providers (APPs -  Physician Assistants and Nurse Practitioners) who all work together to provide you with the care you need, when you need it.  We recommend signing up for the patient portal called "MyChart".  Sign up information is provided on this After Visit  Summary.  MyChart is used to connect with patients  for Virtual Visits (Telemedicine).  Patients are able to view lab/test results, encounter notes, upcoming appointments, etc.  Non-urgent messages can be sent to your provider as well.   To learn more about what you can do with MyChart, go to [email or web address].    Your next appointment:   1 year(s)  Provider:   Parke Poisson, MD     Other Instructions

## 2022-12-02 NOTE — Patient Instructions (Signed)
Medication Instructions:  Your physician recommends that you continue on your current medications as directed. Please refer to the Current Medication list given to you today.  *If you need a refill on your cardiac medications before your next appointment, please call your pharmacy*   Lab Work: NONE ordered at this time of appointment   Testing/Procedures: Your physician has requested that you have an echocardiogram. Echocardiography is a painless test that uses sound waves to create images of your heart. It provides your doctor with information about the size and shape of your heart and how well your heart's chambers and valves are working. This procedure takes approximately one hour. There are no restrictions for this procedure. Please do NOT wear cologne, perfume, aftershave, or lotions (deodorant is allowed). Please arrive 15 minutes prior to your appointment time.    Follow-Up: At [facility], you and your health needs are our priority.  As part of our continuing mission to provide you with exceptional heart care, we have created designated Provider Care Teams.  These Care Teams include your primary Cardiologist (physician) and Advanced Practice Providers (APPs -  Physician Assistants and Nurse Practitioners) who all work together to provide you with the care you need, when you need it.  We recommend signing up for the patient portal called "MyChart".  Sign up information is provided on this After Visit Summary.  MyChart is used to connect with patients for Virtual Visits (Telemedicine).  Patients are able to view lab/test results, encounter notes, upcoming appointments, etc.  Non-urgent messages can be sent to your provider as well.   To learn more about what you can do with MyChart, go to [email or web address].    Your next appointment:   1 year(s)  Provider:   Parke Poisson, MD     Other Instructions

## 2022-12-11 ENCOUNTER — Ambulatory Visit
Admission: RE | Admit: 2022-12-11 | Discharge: 2022-12-11 | Disposition: A | Discharge status: Home / Self Care | Payer: Medicare Other | Source: Ambulatory Visit | Attending: Primary Care | Admitting: Primary Care

## 2022-12-11 ENCOUNTER — Ambulatory Visit: Payer: Medicare Other

## 2022-12-11 DIAGNOSIS — Z1231 Encounter for screening mammogram for malignant neoplasm of breast: Secondary | ICD-10-CM | POA: Diagnosis not present

## 2023-01-03 ENCOUNTER — Ambulatory Visit (HOSPITAL_COMMUNITY): Payer: Medicare Other | Attending: Internal Medicine

## 2023-01-03 DIAGNOSIS — I712 Thoracic aortic aneurysm, without rupture, unspecified: Secondary | ICD-10-CM | POA: Diagnosis not present

## 2023-01-03 DIAGNOSIS — I10 Essential (primary) hypertension: Secondary | ICD-10-CM

## 2023-01-03 LAB — ECHOCARDIOGRAM COMPLETE
Area-P 1/2: 3.17 cm2
S' Lateral: 2.5 cm

## 2023-01-13 ENCOUNTER — Telehealth: Payer: Self-pay | Admitting: Internal Medicine

## 2023-01-13 NOTE — Telephone Encounter (Signed)
Patient called and said that she did not call to cancel her appt. Want to talk with Dr. Jacques Navy or nurse to see what happened and want appt back.

## 2023-01-13 NOTE — Telephone Encounter (Signed)
Spoke with pt regarding her appointment being cancelled. It looks like her 7/3 appointment was cancelled back on 12/02/22 when she was seen in the office. Dr. Jacques Navy recommended that she return in a year. Pt is concerned about her recent echocardiogram done on 01/03/23. Pt states that she was under the impression that she would be coming on 7/3 to review these results. Explained that Dr. Jacques Navy has not resulted echo yet. Will send to Dr. Jacques Navy to make recommendations. Pt is particularly interested in the measurement of the aorta. She did say that if results are normal she is ok to return in a year. While on the phone pt does mention an episode of what she believes is heart burn, she has made an appointment with her PCP to discuss this. Advised pt that her coronary CTA in October 2022 showed no evidence of plaque or stenosis which is reassuring that pain is probably not cardiac in nature. Pt verbalizes understanding.

## 2023-01-14 ENCOUNTER — Ambulatory Visit (INDEPENDENT_AMBULATORY_CARE_PROVIDER_SITE_OTHER): Payer: Medicare Other | Admitting: Family

## 2023-01-14 ENCOUNTER — Encounter: Payer: Self-pay | Admitting: Family

## 2023-01-14 VITALS — BP 126/78 | HR 62 | Temp 97.1°F | Ht 65.0 in | Wt 197.0 lb

## 2023-01-14 DIAGNOSIS — K2100 Gastro-esophageal reflux disease with esophagitis, without bleeding: Secondary | ICD-10-CM

## 2023-01-14 DIAGNOSIS — R1013 Epigastric pain: Secondary | ICD-10-CM | POA: Diagnosis not present

## 2023-01-14 MED ORDER — OMEPRAZOLE 40 MG PO CPDR: 40.0000 mg | DELAYED_RELEASE_CAPSULE | Freq: Every day | ORAL | 0 refills | Status: DC

## 2023-01-14 MED ORDER — SUCRALFATE 1 G PO TABS: 1.0000 g | ORAL_TABLET | Freq: Three times a day (TID) | ORAL | 0 refills | Status: DC

## 2023-01-14 NOTE — Telephone Encounter (Signed)
Left message for pt to call back  °

## 2023-01-14 NOTE — Progress Notes (Signed)
Established Patient Office Visit  Subjective:   Patient ID: Marie Ruiz, female    DOB: 1946-01-12  Age: 77 y.o. MRN: [id number]  CC:  Chief Complaint  Patient presents with   Heartburn   Ear Pain    Right side into back of head     HPI: Marie Ruiz is a 77 y.o. female presenting on 01/14/2023 for Heartburn and Ear Pain (Right side into back of head )  Heartburn and indigestion: states this is chronic however four days ago started with pain that started in epigastric area and went to bil sides of upper quadrants and felt some burning in her throat. She took a home remedy of apple cider and baking soda but threw it off. Has not vomited since.   Had small not solid bowel movement this am.  She is taking tums which are helping some as well as pepto bismal.  She does have a dry cough as well.   Also states that behind her head she has some tenderness. She states that this has been going on for a few months. Does have some ear pain on the right side. Does have some fullness in her right ear as well.   Wt Readings from Last 3 Encounters:  01/14/23 197 lb (89.4 kg)  12/02/22 198 lb 6.4 oz (90 kg)  10/31/22 198 lb 3.2 oz (89.9 kg)   Temp Readings from Last 3 Encounters:  01/14/23 (!) 97.1 F (36.2 C) (Temporal)  06/10/22 (!) 101 F (38.3 C) (Oral)  02/20/22 98.6 F (37 C) (Oral)   BP Readings from Last 3 Encounters:  01/14/23 126/78  12/02/22 112/76  10/31/22 116/80   Pulse Readings from Last 3 Encounters:  01/14/23 62  12/02/22 63  10/31/22 (!) 57      ROS: Negative unless specifically indicated above in HPI.   Relevant past medical history reviewed and updated as indicated.   Allergies and medications reviewed and updated.   Current Outpatient Medications:    acetaminophen (TYLENOL) 500 MG tablet, Take 500 mg by mouth as needed for mild pain., Disp: , Rfl:    Alirocumab (PRALUENT) 150 MG/ML SOAJ, Inject 150 mg into the skin every 14 (fourteen)  days., Disp: 6 mL, Rfl: 3   amLODipine-benazepril (LOTREL) 5-20 MG capsule, Take 1 capsule by mouth daily. For blood pressure., Disp: 90 capsule, Rfl: 2   aspirin EC 81 MG tablet, Take 81 mg by mouth daily., Disp: , Rfl:    b complex vitamins capsule, Take 1 capsule by mouth daily., Disp: , Rfl:    brimonidine (ALPHAGAN) 0.2 % ophthalmic solution, 1 drop 2 (two) times daily., Disp: , Rfl:    Calcium Carbonate-Vit D-Min (CALCIUM 1200 PO), Take by mouth., Disp: , Rfl:    cetirizine (ZYRTEC) 10 MG tablet, Take 10 mg by mouth daily., Disp: , Rfl:    cholecalciferol (VITAMIN D) 1000 units tablet, Take 1,000 Units by mouth daily., Disp: , Rfl:    cyanocobalamin (VITAMIN B12) 1000 MCG tablet, Take 1,000 mcg by mouth daily., Disp: , Rfl:    dorzolamide-timolol (COSOPT) 22.3-6.8 MG/ML ophthalmic solution, Place 1 drop into both eyes 2 (two) times daily., Disp: , Rfl:    FLUoxetine (PROZAC) 20 MG capsule, Take 1 capsule (20 mg total) by mouth daily. For anxiety, Disp: 90 capsule, Rfl: 2   MAGNESIUM PO, Take by mouth., Disp: , Rfl:    Multiple Vitamin (MULTIVITAMIN) tablet, Take 1 tablet by mouth daily., Disp: , Rfl:  omeprazole (PRILOSEC) 40 MG capsule, Take 1 capsule (40 mg total) by mouth daily., Disp: 30 capsule, Rfl: 0   OVER THE COUNTER MEDICATION, Blue - Emu, Disp: , Rfl:    OVER THE COUNTER MEDICATION, Bio true, Disp: , Rfl:    sucralfate (CARAFATE) 1 g tablet, Take 1 tablet (1 g total) by mouth 4 (four) times daily -  with meals and at bedtime for 10 days., Disp: 40 tablet, Rfl: 0   traZODone (DESYREL) 50 MG tablet, Take 1 tablet (50 mg total) by mouth at bedtime as needed for sleep., Disp: 90 tablet, Rfl: 1   trolamine salicylate (ASPERCREME) 10 % cream, Apply 1 application  topically as needed for muscle pain., Disp: , Rfl:   Allergies  Allergen Reactions   Amitriptyline Other (See Comments)    hallucinations   Codeine    Crestor [Rosuvastatin]     Myalgias    Lipitor [Atorvastatin]      Myaglias    Prednisone     Depression    Objective:   BP 126/78   Pulse 62   Temp (!) 97.1 F (36.2 C) (Temporal)   Ht 5\' 5"  (1.651 m)   Wt 197 lb (89.4 kg)   SpO2 94%   BMI 32.78 kg/m    Physical Exam HENT:     Right Ear: Hearing, tympanic membrane, ear canal and external ear normal.     Left Ear: Hearing, tympanic membrane, ear canal and external ear normal.     Mouth/Throat:     Pharynx: Posterior oropharyngeal erythema present.  Abdominal:     Tenderness: There is abdominal tenderness in the epigastric area. There is no guarding or rebound.     Assessment & Plan:  Epigastric pain Assessment & Plan: Try to decrease and or avoid spicy foods, fried fatty foods, and also caffeine and chocolate as these can increase heartburn symptoms.  Trial omeprazole 40 mg once daily x 30 days  Will treat for esophagitis  Also rx sent for carafate for some relief    Orders: -     Sucralfate; Take 1 tablet (1 g total) by mouth 4 (four) times daily -  with meals and at bedtime for 10 days.  Dispense: 40 tablet; Refill: 0  Gastroesophageal reflux disease with esophagitis without hemorrhage -     Omeprazole; Take 1 capsule (40 mg total) by mouth daily.  Dispense: 30 capsule; Refill: 0 -     Sucralfate; Take 1 tablet (1 g total) by mouth 4 (four) times daily -  with meals and at bedtime for 10 days.  Dispense: 40 tablet; Refill: 0     Follow up plan: Return in about 2 weeks (around 01/28/2023) for f/u with PCP with Jae Dire .  Mort Sawyers, FNP

## 2023-01-14 NOTE — Assessment & Plan Note (Signed)
Try to decrease and or avoid spicy foods, fried fatty foods, and also caffeine and chocolate as these can increase heartburn symptoms.  Trial omeprazole 40 mg once daily x 30 days  Will treat for esophagitis  Also rx sent for carafate for some relief

## 2023-01-14 NOTE — Patient Instructions (Signed)
  Start trial omeprazole x 30 days  Start carafate to coat lining of esophagus for some relief.    Regards,   Mort Sawyers FNP-C

## 2023-01-20 NOTE — Telephone Encounter (Signed)
Marie Poisson, MD 01/13/2023  4:28 PM EDT     Stable findings on echocardiogram. Can discuss in detail if patient would like to meet to review.    Spoke with pt regarding her echocardiogram results. Pt is comfortable with results and is ok to follow up 1 year from last office visit. Advised pt that can wait for her recall or can call in January to make appointment for May 2025. Pt verbalizes understanding.

## 2023-01-22 ENCOUNTER — Ambulatory Visit: Payer: Medicare Other | Admitting: Internal Medicine

## 2023-01-24 ENCOUNTER — Other Ambulatory Visit: Payer: Self-pay | Admitting: Primary Care

## 2023-01-24 DIAGNOSIS — F411 Generalized anxiety disorder: Secondary | ICD-10-CM

## 2023-01-28 ENCOUNTER — Encounter: Payer: Self-pay | Admitting: Primary Care

## 2023-01-28 ENCOUNTER — Ambulatory Visit (INDEPENDENT_AMBULATORY_CARE_PROVIDER_SITE_OTHER): Payer: Medicare Other | Admitting: Primary Care

## 2023-01-28 VITALS — BP 112/72 | HR 56 | Temp 97.6°F | Ht 65.0 in | Wt 196.0 lb

## 2023-01-28 DIAGNOSIS — M79609 Pain in unspecified limb: Secondary | ICD-10-CM

## 2023-01-28 DIAGNOSIS — K2100 Gastro-esophageal reflux disease with esophagitis, without bleeding: Secondary | ICD-10-CM | POA: Diagnosis not present

## 2023-01-28 DIAGNOSIS — R197 Diarrhea, unspecified: Secondary | ICD-10-CM | POA: Insufficient documentation

## 2023-01-28 DIAGNOSIS — R3915 Urgency of urination: Secondary | ICD-10-CM

## 2023-01-28 DIAGNOSIS — H9201 Otalgia, right ear: Secondary | ICD-10-CM | POA: Insufficient documentation

## 2023-01-28 HISTORY — DX: Diarrhea, unspecified: R19.7

## 2023-01-28 LAB — POC URINALSYSI DIPSTICK (AUTOMATED)
Bilirubin, UA: NEGATIVE
Blood, UA: NEGATIVE
Glucose, UA: NEGATIVE
Ketones, UA: NEGATIVE
Leukocytes, UA: NEGATIVE
Nitrite, UA: NEGATIVE
Protein, UA: NEGATIVE
Spec Grav, UA: 1.015 (ref 1.010–1.025)
Urobilinogen, UA: 0.2 E.U./dL
pH, UA: 6 (ref 5.0–8.0)

## 2023-01-28 MED ORDER — FLUTICASONE PROPIONATE 50 MCG/ACT NA SUSP
1.0000 | Freq: Two times a day (BID) | NASAL | 0 refills | Status: DC
Start: 2023-01-28 — End: 2023-03-12

## 2023-01-28 MED ORDER — PANTOPRAZOLE SODIUM 20 MG PO TBEC
20.0000 mg | DELAYED_RELEASE_TABLET | Freq: Two times a day (BID) | ORAL | 0 refills | Status: DC
Start: 2023-01-28 — End: 2023-04-25

## 2023-01-28 NOTE — Patient Instructions (Addendum)
You will either be contacted via phone regarding your referral to ENT, or you may receive a letter on your MyChart portal from our referral team with instructions for scheduling an appointment. Please let us know if you have not been contacted by anyone within two weeks.  We will complete your ultrasound either today or tomorrow.  Stop taking omeprazole 40 mg daily.  Start taking pantoprazole 20 mg twice daily for heartburn.  Return the stool samples as soon as possible.  Schedule your physical for 1 month from now.  It was a pleasure to see you today!

## 2023-01-28 NOTE — Addendum Note (Signed)
Addended by: Alvina Chou on: 01/28/2023 10:51 AM   Modules accepted: Orders

## 2023-01-28 NOTE — Assessment & Plan Note (Signed)
UA today negative.

## 2023-01-28 NOTE — Assessment & Plan Note (Signed)
Could be side effects from omeprazole. Discontinue omeprazole.  Stool studies ordered and pending.

## 2023-01-28 NOTE — Assessment & Plan Note (Signed)
Exam today more consistent for Baker's cyst. Given her history of DVT, we will obtain a stat venous ultrasound to rule out DVT.  Ordered and pending.

## 2023-01-28 NOTE — Assessment & Plan Note (Signed)
Improved but not at goal. Also, omeprazole could be causing diarrhea.  Stop omeprazole 40 mg daily.  Start pantoprazole 20 mg twice daily. Continue to avoid irritating foods. She will update.

## 2023-01-28 NOTE — Progress Notes (Signed)
Subjective:    Patient ID: Marie Ruiz, female    DOB: [birth year], 77 y.o.   MRN: [id number]  HPI  Marie Ruiz is a very pleasant 77 y.o. female with history of hypertension, Graves' disease, hyperlipidemia, epigastric pain who presents today for follow-up of epigastric pain and to discuss ear pain, lower extremity swelling.  1) GERD: Evaluated by Wyatt Mage, NP on 01/14/2023 for a 4-day history of increased esophageal reflux symptoms, epigastric pain with radiation to bilateral upper abdomen, increased burning to her throat.  She was treated with omeprazole 40 mg daily x 30 days and provided a prescription for Carafate to use as needed.  She is here for follow-up today.  Since her last visit she's been taking omeprazole 40 mg daily which has helped reduce esophageal burning. She completed her course of Carafate tablets yesterday which also helped.   Two days ago she began to experience a burning sensation/pressure that begins in the suprapubic region with radiation up her mid abdomen through to her mid chest. This occurs only with urination. She's also noticed urinary urgency with a pressure in the bladder.   Over the last three years she's experienced 1 episode of diarrhea once monthly. Three days ago she developed increased diarrhea episodes, approximately 4 episodes daily. She also began noticing intermittent nausea then.   She denies hematuria, dysuria, fevers, vomiting. She has noticed a decreased appetite, she's also been more cautious with her diet due to her GERD symptoms.   This morning she took a dose of Imodium-D so that she could make her appointment.  Follows with cardiology, last office visit was in May 2024.  2) Ear Pain/Headache: Chronic right ear pain for the last 3 months which is intermittent and "feels like an ache". Also with intermittent right temporal pain that occurs at the same time.   She's not used nasal sprays. She will take Tylenol for pain with  improvement. She does follow with opthalmology for glaucoma, last visit was 3 months ago.  She denies dizziness. She has noticed more blurred vision.   3) Popliteal Pain: Chronic for the last 6 months, located to the left side. Over the last several weeks she's noticed increased pain, mostly with rest. History of DVT in 2020. No longer on Xarelto.   She denies lower extremity swelling, left popliteal swelling, recent long travel. She has been more sedentary.   Review of Systems  HENT:  Positive for ear pain. Negative for postnasal drip and sore throat.   Eyes:  Positive for visual disturbance.  Respiratory:  Negative for shortness of breath.   Cardiovascular:  Negative for chest pain.       Left popliteal pain  Gastrointestinal:        Esophageal burning  Genitourinary:  Positive for urgency. Negative for dysuria and hematuria.  Skin:  Negative for color change.  Neurological:  Negative for dizziness and headaches.         Past Medical History:  Diagnosis Date   Allergy    seasonal   Anxiety    Aortic aneurysm (HCC) 08/2018   Arthritis    Blood clotting disorder (HCC)    Bone spur    with excision   Chickenpox    Colon polyps    DVT (deep venous thrombosis) (HCC)    Glaucoma    both   Graves disease 03/30/2013   History of blood clots 10/2017   Hyperlipemia    Hypertension    Osteopenia  Prediabetes    Restless leg syndrome     Social History   Socioeconomic History   Marital status: Married    Spouse name: Not on file   Number of children: Not on file   Years of education: Not on file   Highest education level: Not on file  Occupational History   Occupation: Retired  Tobacco Use   Smoking status: Former    Types: Cigarettes    Quit date: 1990    Years since quitting: 34.5   Smokeless tobacco: Never  Vaping Use   Vaping Use: Never used  Substance and Sexual Activity   Alcohol use: Yes    Alcohol/week: 2.0 standard drinks of alcohol    Types: 2  Standard drinks or equivalent per week   Drug use: Not Currently   Sexual activity: Not Currently  Other Topics Concern   Not on file  Social History Narrative   Lives in [place], moving to [place].   Retired.   Married.    Social Determinants of Health   Financial Resource Strain: Low Risk  (10/31/2022)   Overall Financial Resource Strain (CARDIA)    Difficulty of Paying Living Expenses: Not hard at all  Food Insecurity: No Food Insecurity (10/31/2022)   Hunger Vital Sign    Worried About Running Out of Food in the Last Year: Never true    Ran Out of Food in the Last Year: Never true  Transportation Needs: No Transportation Needs (10/31/2022)   PRAPARE - Administrator, Civil Service (Medical): No    Lack of Transportation (Non-Medical): No  Physical Activity: Inactive (10/31/2022)   Exercise Vital Sign    Days of Exercise per Week: 0 days    Minutes of Exercise per Session: 0 min  Stress: No Stress Concern Present (10/31/2022)   [employer] of Occupational Health - Occupational Stress Questionnaire    Feeling of Stress : Not at all  Social Connections: Socially Integrated (10/31/2022)   Social Connection and Isolation Panel [NHANES]    Frequency of Communication with Friends and Family: More than three times a week    Frequency of Social Gatherings with Friends and Family: More than three times a week    Attends Religious Services: More than 4 times per year    Active Member of [employer] or Organizations: Yes    Attends Engineer, structural: More than 4 times per year    Marital Status: Married  Catering manager Violence: Not At Risk (10/31/2022)   Humiliation, Afraid, Rape, and Kick questionnaire    Fear of Current or Ex-Partner: No    Emotionally Abused: No    Physically Abused: No    Sexually Abused: No    Past Surgical History:  Procedure Laterality Date   BREAST CYST ASPIRATION     CATARACT EXTRACTION W/ INTRAOCULAR LENS IMPLANT Right 2015    CATARACT EXTRACTION W/PHACO Left 2017   COLONOSCOPY  2014   DILATION AND CURETTAGE OF UTERUS      Family History  Problem Relation Age of Onset   Heart disease Mother    Heart attack Father    Breast cancer Sister 67   Colon polyps Sister    Aneurysm Brother    Multiple myeloma Sister        found in stage 4   Breast cancer Maternal Grandmother    Esophageal cancer Maternal Aunt 82   Colon cancer Neg Hx    Rectal cancer Neg Hx    Stomach  cancer Neg Hx     Allergies  Allergen Reactions   Amitriptyline Other (See Comments)    hallucinations   Codeine    Crestor [Rosuvastatin]     Myalgias    Lipitor [Atorvastatin]     Myaglias    Prednisone     Depression    Current Outpatient Medications on File Prior to Visit  Medication Sig Dispense Refill   acetaminophen (TYLENOL) 500 MG tablet Take 500 mg by mouth as needed for mild pain.     Alirocumab (PRALUENT) 150 MG/ML SOAJ Inject 150 mg into the skin every 14 (fourteen) days. 6 mL 3   amLODipine-benazepril (LOTREL) 5-20 MG capsule Take 1 capsule by mouth daily. For blood pressure. 90 capsule 2   aspirin EC 81 MG tablet Take 81 mg by mouth daily.     b complex vitamins capsule Take 1 capsule by mouth daily.     brimonidine (ALPHAGAN) 0.2 % ophthalmic solution 1 drop 2 (two) times daily.     Calcium Carbonate-Vit D-Min (CALCIUM 1200 PO) Take by mouth.     cetirizine (ZYRTEC) 10 MG tablet Take 10 mg by mouth daily.     cholecalciferol (VITAMIN D) 1000 units tablet Take 1,000 Units by mouth daily.     cyanocobalamin (VITAMIN B12) 1000 MCG tablet Take 1,000 mcg by mouth daily.     dorzolamide-timolol (COSOPT) 22.3-6.8 MG/ML ophthalmic solution Place 1 drop into both eyes 2 (two) times daily.     FLUoxetine (PROZAC) 20 MG capsule TAKE 1 CAPSULE (20 MG TOTAL) BY MOUTH DAILY. FOR ANXIETY 90 capsule 0   MAGNESIUM PO Take by mouth.     Multiple Vitamin (MULTIVITAMIN) tablet Take 1 tablet by mouth daily.     OVER THE COUNTER  MEDICATION Blue - Emu     OVER THE COUNTER MEDICATION Bio true     traZODone (DESYREL) 50 MG tablet Take 1 tablet (50 mg total) by mouth at bedtime as needed for sleep. 90 tablet 1   trolamine salicylate (ASPERCREME) 10 % cream Apply 1 application  topically as needed for muscle pain.     sucralfate (CARAFATE) 1 g tablet Take 1 tablet (1 g total) by mouth 4 (four) times daily -  with meals and at bedtime for 10 days. 40 tablet 0   No current facility-administered medications on file prior to visit.    BP 112/72   Pulse (!) 56   Temp 97.6 F (36.4 C) (Temporal)   Ht 5\' 5"  (1.651 m)   Wt 196 lb (88.9 kg)   SpO2 98%   BMI 32.62 kg/m  Objective:   Physical Exam Constitutional:      General: She is not in acute distress.    Appearance: She is not ill-appearing.  HENT:     Right Ear: Tympanic membrane and ear canal normal.     Left Ear: Tympanic membrane and ear canal normal.  Cardiovascular:     Rate and Rhythm: Normal rate and regular rhythm.  Pulmonary:     Effort: Pulmonary effort is normal.     Breath sounds: Normal breath sounds.  Musculoskeletal:     Cervical back: Neck supple.       Legs:     Comments: Mild swelling noted to left popliteal fossa, no tenderness.  No calf tenderness, swelling, warmth noted bilaterally.  Skin:    General: Skin is warm and dry.  Neurological:     Mental Status: She is alert.  Psychiatric:  Mood and Affect: Mood normal.           Assessment & Plan:  Gastroesophageal reflux disease with esophagitis without hemorrhage Assessment & Plan: Improved but not at goal. Also, omeprazole could be causing diarrhea.  Stop omeprazole 40 mg daily.  Start pantoprazole 20 mg twice daily. Continue to avoid irritating foods. She will update.  Orders: -     Pantoprazole Sodium; Take 1 tablet (20 mg total) by mouth 2 (two) times daily before a meal. for heartburn.  Dispense: 180 tablet; Refill: 0  Urinary urgency Assessment &  Plan: UA today negative.   Orders: -     POCT Urinalysis Dipstick (Automated) -     Urine Culture  Popliteal pain Assessment & Plan: Exam today more consistent for Baker's cyst. Given her history of DVT, we will obtain a stat venous ultrasound to rule out DVT.  Ordered and pending.  Orders: -     US Venous Img Lower Unilateral Left (DVT); Future  Right ear pain Assessment & Plan: Exam today benign.  Discussed to follow-up with ophthalmology as scheduled. Start Flonase twice daily.  I have asked that she verify that she can use Flonase from her ophthalmologist.  Referral placed to ENT.  Orders: -     Fluticasone Propionate; Place 1 spray into both nostrils 2 (two) times daily.  Dispense: 16 g; Refill: 0 -     Ambulatory referral to ENT  Acute diarrhea Assessment & Plan: Could be side effects from omeprazole. Discontinue omeprazole.  Stool studies ordered and pending.  Orders: -     Giardia antigen -     Gastrointestinal Pathogen Pnl RT, PCR -     C. difficile GDH and Toxin A/B        Doreene Nest, NP

## 2023-01-28 NOTE — Assessment & Plan Note (Signed)
Exam today benign.  Discussed to follow-up with ophthalmology as scheduled. Start Flonase twice daily.  I have asked that she verify that she can use Flonase from her ophthalmologist.  Referral placed to ENT.

## 2023-01-29 ENCOUNTER — Other Ambulatory Visit: Payer: Self-pay | Admitting: Radiology

## 2023-01-29 DIAGNOSIS — R197 Diarrhea, unspecified: Secondary | ICD-10-CM

## 2023-01-29 LAB — URINE CULTURE
MICRO NUMBER:: 15176738
Result:: NO GROWTH
SPECIMEN QUALITY:: ADEQUATE

## 2023-01-30 LAB — C. DIFFICILE GDH AND TOXIN A/B
GDH ANTIGEN: NOT DETECTED
MICRO NUMBER:: 15183275
SPECIMEN QUALITY:: ADEQUATE
TOXIN A AND B: NOT DETECTED

## 2023-01-31 LAB — GASTROINTESTINAL PATHOGEN PNL
CampyloBacter Group: NOT DETECTED
Norovirus GI/GII: NOT DETECTED
Rotavirus A: NOT DETECTED
Salmonella species: NOT DETECTED
Shiga Toxin 1: NOT DETECTED
Shiga Toxin 2: NOT DETECTED
Shigella Species: NOT DETECTED
Vibrio Group: NOT DETECTED
Yersinia enterocolitica: NOT DETECTED

## 2023-01-31 LAB — GIARDIA ANTIGEN
MICRO NUMBER:: 15182515
RESULT:: NOT DETECTED
SPECIMEN QUALITY:: ADEQUATE

## 2023-02-03 ENCOUNTER — Other Ambulatory Visit: Payer: Self-pay | Admitting: Internal Medicine

## 2023-02-03 DIAGNOSIS — E785 Hyperlipidemia, unspecified: Secondary | ICD-10-CM

## 2023-02-03 DIAGNOSIS — I700 Atherosclerosis of aorta: Secondary | ICD-10-CM

## 2023-02-03 DIAGNOSIS — E782 Mixed hyperlipidemia: Secondary | ICD-10-CM

## 2023-02-04 ENCOUNTER — Ambulatory Visit
Admission: RE | Admit: 2023-02-04 | Discharge: 2023-02-04 | Disposition: A | Discharge status: Home / Self Care | Payer: Medicare Other | Source: Ambulatory Visit | Attending: Primary Care | Admitting: Primary Care

## 2023-02-04 DIAGNOSIS — M79609 Pain in unspecified limb: Secondary | ICD-10-CM

## 2023-02-04 DIAGNOSIS — M79605 Pain in left leg: Secondary | ICD-10-CM | POA: Diagnosis not present

## 2023-02-05 ENCOUNTER — Telehealth: Payer: Self-pay | Admitting: Primary Care

## 2023-02-05 DIAGNOSIS — M79609 Pain in unspecified limb: Secondary | ICD-10-CM

## 2023-02-05 NOTE — Telephone Encounter (Signed)
Patient called in regarding Marie Ruiz message and stated that she is willing to do the MRI of her knee.

## 2023-02-05 NOTE — Telephone Encounter (Signed)
Left message for Marie Ruiz that  Marie Ruiz has placed an order for the MRI.  She should receive a phone call regarding the location and scheduling dates.  I ask that she contact us if she has not heard back within 1 week

## 2023-02-05 NOTE — Telephone Encounter (Signed)
Please notify patient that I placed an order for the MRI.  She should receive a phone call regarding the location and scheduling dates.  Have her contact us if she has not heard back within 1 week.

## 2023-02-14 ENCOUNTER — Telehealth: Payer: Self-pay

## 2023-02-14 ENCOUNTER — Ambulatory Visit (HOSPITAL_BASED_OUTPATIENT_CLINIC_OR_DEPARTMENT_OTHER)
Admission: RE | Admit: 2023-02-14 | Discharge: 2023-02-14 | Disposition: A | Discharge status: Home / Self Care | Payer: Medicare Other | Source: Ambulatory Visit | Attending: Primary Care | Admitting: Primary Care

## 2023-02-14 DIAGNOSIS — M79609 Pain in unspecified limb: Secondary | ICD-10-CM | POA: Diagnosis not present

## 2023-02-14 DIAGNOSIS — M25562 Pain in left knee: Secondary | ICD-10-CM | POA: Diagnosis not present

## 2023-02-14 DIAGNOSIS — M25462 Effusion, left knee: Secondary | ICD-10-CM | POA: Diagnosis not present

## 2023-02-14 DIAGNOSIS — M1712 Unilateral primary osteoarthritis, left knee: Secondary | ICD-10-CM | POA: Diagnosis not present

## 2023-02-14 MED ORDER — GADOBUTROL 1 MMOL/ML IV SOLN
7.5000 mL | Freq: Once | INTRAVENOUS | Status: AC | PRN
  Administered 2023-02-14: 7.5 mL via INTRAVENOUS
  Filled 2023-02-14: qty 7.5

## 2023-02-14 NOTE — Telephone Encounter (Signed)
Spoke with pt, Praluent is very expensive now. Sounds like pharmacy is running rx through her Part D plan. She also has state health plan which lets her use copay card. Praluent has worse copay card of $50/month. Will see if her state plan will cover Repatha instead since their copay card is much better at $5/month. Pt aware I will follow up with this on Monday and let her know what I find out.

## 2023-02-14 NOTE — Telephone Encounter (Signed)
I spoke with patient who would like to switch from Praluent back to Crestor and would like to speak with Pharmacist about that.  Please call to discuss.  Thank you

## 2023-02-17 ENCOUNTER — Encounter: Payer: Self-pay | Admitting: Pharmacist

## 2023-02-17 MED ORDER — ROSUVASTATIN CALCIUM 5 MG PO TABS: 5.0000 mg | ORAL_TABLET | Freq: Every day | ORAL | 3 refills | Status: DC

## 2023-02-17 MED ORDER — REPATHA SURECLICK 140 MG/ML ~~LOC~~ SOAJ: 140.0000 mg | SUBCUTANEOUS | 3 refills | Status: DC

## 2023-02-17 NOTE — Telephone Encounter (Signed)
Pt states she activated the Repatha copay card but then the pharmacy told her they couldn't run the copay card. Advised her they can, and they need to just run it through her state plan and the copay card and ignore her Part D card. She then states she doesn't want to take the injection and just wants to go back to Crestor. Prior notes mention she had side effects on > 5mg  3x weekly, however pt states she had something else going on with her legs and that she can tolerate 5mg  daily just fine. I have sent in an updated rx for this. I again advised her that if she tells the pharmacy to run the Repatha through her state plan + copay card it will work. She does not want to deal with it, didn't provide me with the copay card info, and just wants to take Crestor now.

## 2023-02-17 NOTE — Telephone Encounter (Addendum)
Repatha PA submitted, Key: [id number], approved through 02/17/24.   I have left detailed message for pt and have sent her mychart message with instruction to stop Praluent, start Repatha, advise her pharmacy to only run rx through her state plan, not her Medicare D plan, and to call Repatha to activate copay card at #[phone] (our office cannot activate online for patients over 65). She will call back with any concerns.

## 2023-02-17 NOTE — Addendum Note (Signed)
Addended by: Jayon Matton E on: 02/17/2023 02:15 PM   Modules accepted: Orders

## 2023-02-17 NOTE — Addendum Note (Signed)
Addended by: Blake Vetrano E on: 02/17/2023 12:53 PM   Modules accepted: Orders

## 2023-02-18 ENCOUNTER — Telehealth: Payer: Self-pay

## 2023-02-18 ENCOUNTER — Other Ambulatory Visit (HOSPITAL_COMMUNITY): Payer: Self-pay

## 2023-02-18 NOTE — Telephone Encounter (Signed)
Pharmacy Patient Advocate Encounter   Received notification from CoverMyMeds that prior authorization for REPATHA is required/requested.   Insurance verification completed.   The patient is insured through [facility] .   Per test claim: PA required; PA submitted to [facility] via CoverMyMeds Key/confirmation #/EOC [id number]             Status is pending

## 2023-02-19 ENCOUNTER — Other Ambulatory Visit: Payer: Self-pay | Admitting: Primary Care

## 2023-02-19 DIAGNOSIS — H9201 Otalgia, right ear: Secondary | ICD-10-CM

## 2023-02-19 NOTE — Telephone Encounter (Signed)
Pharmacy Patient Advocate Encounter  Received notification from [facility] that Prior Authorization for REPATHA has been  Approved 1.30.25  Key: [id number]

## 2023-02-24 ENCOUNTER — Encounter (INDEPENDENT_AMBULATORY_CARE_PROVIDER_SITE_OTHER): Payer: Self-pay | Admitting: Otolaryngology

## 2023-02-24 ENCOUNTER — Ambulatory Visit (INDEPENDENT_AMBULATORY_CARE_PROVIDER_SITE_OTHER): Payer: Medicare Other | Admitting: Otolaryngology

## 2023-02-24 VITALS — BP 108/70 | HR 67 | Ht 65.5 in | Wt 195.0 lb

## 2023-02-24 DIAGNOSIS — H9201 Otalgia, right ear: Secondary | ICD-10-CM

## 2023-02-24 DIAGNOSIS — H9311 Tinnitus, right ear: Secondary | ICD-10-CM | POA: Diagnosis not present

## 2023-02-24 DIAGNOSIS — R1310 Dysphagia, unspecified: Secondary | ICD-10-CM

## 2023-02-24 DIAGNOSIS — R09A2 Foreign body sensation, throat: Secondary | ICD-10-CM | POA: Diagnosis not present

## 2023-02-24 DIAGNOSIS — R519 Headache, unspecified: Secondary | ICD-10-CM | POA: Diagnosis not present

## 2023-02-24 DIAGNOSIS — K219 Gastro-esophageal reflux disease without esophagitis: Secondary | ICD-10-CM

## 2023-02-24 DIAGNOSIS — M26621 Arthralgia of right temporomandibular joint: Secondary | ICD-10-CM

## 2023-02-24 DIAGNOSIS — J329 Chronic sinusitis, unspecified: Secondary | ICD-10-CM

## 2023-02-24 NOTE — Patient Instructions (Signed)
-   Flonase and allergy pill  - start Reflux gourmet and continue reflux medication - schedule a swallow study (MBS/esophagram) - schedule CT sinuses  - schedule lab work (ESR CRP)   TMJ (Temporomandibular Joint Syndrome) The temporomandibular (tem-puh-roe-man-DIB-u-lur) joint (TMJ) acts like a sliding hinge, connecting your jawbone to your skull. You have one joint on each side of your jaw. TMJ disorders -- a type of temporomandibular disorder or TMD -- can cause pain in your jaw joint and in the muscles that control jaw movement.  The exact cause of a person's TMJ disorder is often difficult to determine. Your pain may be due to a combination of factors, such as genetics, arthritis or jaw injury. Some people who have jaw pain also tend to clench or grind their teeth (bruxism), although many people habitually clench or grind their teeth and never develop TMJ disorders.  In most cases, the pain and discomfort associated with TMJ disorders is temporary and can be relieved with self-managed care or nonsurgical treatments. This includes stress reduction, softer diet when the pain is present, anti-inflammatory pain medications such as Motrin and warm compresses.

## 2023-02-24 NOTE — Progress Notes (Signed)
ENT CONSULT:  Reason for Consult: right ear/temple and occiput discomfort x 1 year, throat discomfort   HPI: Marie Ruiz is an 77 y.o. female with hx of GERD, recently started Pantoprazole x 1 mo, improved after she started it, epigastric pain, was previously on carafate, and hx of DVT left leg 2018, previously on Xarelto, hx of left lower extremity mass, MRI report pending, here for multiple complaints  Right ear discomfort - sensation of dull achy feeling in right ear, no hearing changes, denies frequent ear popping. She has intermittent tinnitus. Denies bruxism. Denies hx migraines, but she does have tension headaches sometimes, typically on the right side. She has hx of glaucoma. She sees Ophtho 6 months. No ear surgeries. No ear drainage.   Throat discomfort x few months. She has choking at times, but not frequently. She rates it at 4/10, usually twice a week, and feels as dull pain. She is tolerating a regular diet.   Records Reviewed:  PCP note - hx of DVT LLE dx 2020 stopped Xarelto    Past Medical History:  Diagnosis Date   Allergy    seasonal   Anxiety    Aortic aneurysm (HCC) 08/2018   Arthritis    Blood clotting disorder (HCC)    Bone spur    with excision   Chickenpox    Colon polyps    DVT (deep venous thrombosis) (HCC)    Glaucoma    both   Graves disease 03/30/2013   History of blood clots 10/2017   Hyperlipemia    Hypertension    Osteopenia    Prediabetes    Restless leg syndrome     Past Surgical History:  Procedure Laterality Date   BREAST CYST ASPIRATION     CATARACT EXTRACTION W/ INTRAOCULAR LENS IMPLANT Right 2015   CATARACT EXTRACTION W/PHACO Left 2017   COLONOSCOPY  2014   DILATION AND CURETTAGE OF UTERUS      Family History  Problem Relation Age of Onset   Heart disease Mother    Heart attack Father    Breast cancer Sister 59   Colon polyps Sister    Aneurysm Brother    Multiple myeloma Sister        found in stage 4   Breast  cancer Maternal Grandmother    Esophageal cancer Maternal Aunt 82   Colon cancer Neg Hx    Rectal cancer Neg Hx    Stomach cancer Neg Hx     Social History:  reports that she quit smoking about 34 years ago. Her smoking use included cigarettes. She has never used smokeless tobacco. She reports current alcohol use of about 2.0 standard drinks of alcohol per week. She reports that she does not currently use drugs.  Allergies:  Allergies  Allergen Reactions   Amitriptyline Other (See Comments)    hallucinations   Codeine    Crestor [Rosuvastatin]     Myalgias    Lipitor [Atorvastatin]     Myaglias    Prednisone     Depression    Medications: I have reviewed the patient's current medications.  The PMH, PSH, Medications, Allergies, and SH were reviewed and updated.  ROS: Constitutional: Negative for fever, weight loss and weight gain. Cardiovascular: Negative for chest pain and dyspnea on exertion. Respiratory: Is not experiencing shortness of breath at rest. Gastrointestinal: Negative for nausea and vomiting. Neurological: Negative for headaches. Psychiatric: The patient is not nervous/anxious  Blood pressure 108/70, pulse 67, height 5' 5.5" (1.664 m),  weight 195 lb (88.5 kg), SpO2 98%.  PHYSICAL EXAM:  Exam: General: Well-developed, well-nourished Communication and Voice: Clear pitch and clarity Respiratory Respiratory effort: Equal inspiration and expiration without stridor Cardiovascular Peripheral Vascular: Warm extremities with equal color/perfusion Eyes: No nystagmus with equal extraocular motion bilaterally Neuro/Psych/Balance: Patient oriented to person, place, and time; Appropriate mood and affect; Gait is intact with no imbalance; Cranial nerves I-XII are intact Head and Face Inspection: Normocephalic and atraumatic without mass or lesion Palpation: Facial skeleton intact without bony stepoffs Salivary Glands: No mass or tenderness Facial Strength: Facial  motility symmetric and full bilaterally ENT Pinna: External ear intact and fully developed External canal: Canal is patent with intact skin Tympanic Membrane: Clear and mobile External Nose: No scar or anatomic deformity Internal Nose: Septum is slightly deviated. No polyp, or purulence. Mucosal edema and erythema present.  Bilateral inferior turbinate hypertrophy.  Lips, Teeth, and gums: Mucosa and teeth intact and viable TMJ: R TMJ with pain to palpation with full mobility Oral cavity/oropharynx: No erythema or exudate, no lesions present Nasopharynx: No mass or lesion with intact mucosa Hypopharynx: Intact mucosa without pooling of secretions Larynx Glottic: Full true vocal cord mobility without lesion or mass Supraglottic: Normal appearing epiglottis and AE folds Interarytenoid Space: No or minimal pachydermia or edema Subglottic Space: Patent without lesion or edema Neck Neck and Trachea: Midline trachea without mass or lesion Thyroid: No mass or nodularity Lymphatics: No lymphadenopathy  Procedure:  Preoperative diagnosis:  Postoperative diagnosis:   Same  Procedure: Flexible fiberoptic laryngoscopy  Surgeon: Ashok Croon, MD  Anesthesia: Topical lidocaine and Afrin Complications: None Condition is stable throughout exam  Indications and consent:  The patient presents to the clinic with Indirect laryngoscopy view was incomplete. Thus it was recommended that they undergo a flexible fiberoptic laryngoscopy. All of the risks, benefits, and potential complications were reviewed with the patient preoperatively and verbal informed consent was obtained.  Procedure: The patient was seated upright in the clinic. Topical lidocaine and Afrin were applied to the nasal cavity. After adequate anesthesia had occurred, I then proceeded to pass the flexible telescope into the nasal cavity. The nasal cavity was patent without rhinorrhea or polyp. The nasopharynx was also patent without  mass or lesion. The base of tongue was visualized and was normal. There were no signs of pooling of secretions in the piriform sinuses. The true vocal folds were mobile bilaterally. There were no signs of glottic or supraglottic mucosal lesion or mass. There was moderate interarytenoid pachydermia and post cricoid edema. The telescope was then slowly withdrawn and the patient tolerated the procedure throughout.     Studies Reviewed: 02/04/23 LLE venous U/S EXAM: LEFT LOWER EXTREMITY VENOUS DOPPLER ULTRASOUND   TECHNIQUE: Gray-scale sonography with graded compression, as well as color Doppler and duplex ultrasound were performed to evaluate the lower extremity deep venous systems from the level of the common femoral vein and including the common femoral, femoral, profunda femoral, popliteal and calf veins including the posterior tibial, peroneal and gastrocnemius veins when visible. The superficial great saphenous vein was also interrogated. Spectral Doppler was utilized to evaluate flow at rest and with distal augmentation maneuvers in the common femoral, femoral and popliteal veins.   COMPARISON:  None Available.   FINDINGS: Contralateral Common Femoral Vein: Respiratory phasicity is normal and symmetric with the symptomatic side. No evidence of thrombus. Normal compressibility.   Common Femoral Vein: No evidence of thrombus. Normal compressibility, respiratory phasicity and response to augmentation.   Saphenofemoral Junction:  No evidence of thrombus. Normal compressibility and flow on color Doppler imaging.   Profunda Femoral Vein: No evidence of thrombus. Normal compressibility and flow on color Doppler imaging.   Femoral Vein: No evidence of thrombus. Normal compressibility, respiratory phasicity and response to augmentation.   Popliteal Vein: No evidence of thrombus. Normal compressibility, respiratory phasicity and response to augmentation.   Calf Veins: No evidence of  thrombus. Normal compressibility and flow on color Doppler imaging.   Superficial Great Saphenous Vein: No evidence of thrombus. Normal compressibility.   Other Findings: Sonographic evaluation of patient's area of discomfort involving the posterior aspect of the distal thigh demonstrates a 1.2 x 0.9 x 0.9 cm anechoic apparent fluid collection about the posterior distal aspect of the femur, reportedly not in communication with the popliteal space to suggest the presence of a Baker's cyst.   Otherwise, there is no sonographic correlate for patient's area of discomfort involving the posterior aspect of the distal left thigh. Specifically, no regional superficial thrombophlebitis.   IMPRESSION: 1. No evidence of acute or chronic DVT within the left lower extremity. 2. Sonographic evaluation of patient's area of discomfort involving the posterior aspect of the distal left thigh demonstrates a 1.2 cm anechoic apparent fluid collection about the posterior distal aspect of the femur, reportedly not in communication with the popliteal space to suggest this fluid collection represents a Baker's cyst. While nonspecific, this structure could represent an inflamed bursal sac, hematoma or seroma. Clinical correlation is advised. Further evaluation of the knee MRI could be performed as indicated  Assessment/Plan: Encounter Diagnoses  Name Primary?   Tinnitus of right ear    Right ear pain    Arthralgia of right temporomandibular joint    Nonintractable headache, unspecified chronicity pattern, unspecified headache type    Dysphagia, unspecified type    Odynophagia Yes   Gastroesophageal reflux disease without esophagitis    Globus pharyngeus    Chronic sinusitis, unspecified location     77 year old female with a recent diagnosis of GERD LPR recently started on pantoprazole, reports symptoms of chest discomfort and epigastric discomfort for which she was also given Carafate currently  does not take it anymore, history of tension type headaches that typically presents with right sided headache, history of glaucoma followed by ophthalmology, who is here for chronic right ear discomfort pain in the right temple area both are intermittent in nature and come and go described as dull achy pain.  She denies acute changes in her vision with the symptoms.  She also denies history of recurrent ear infections or ear drainage or ear surgeries.  Right-sided tinnitus but no significant ringing on the other side.  She is also here for chronic throat discomfort, present only sometimes, and intermittent choking on foods or liquids which only happens once or twice a week.  Tolerating regular diet.  No prior swallow studies.  She has history of nasal congestion and thinks her headaches are related to chronic nasal congestion and sinus inflammation.  Started over-the-counter Flonase and allergy medication, and feels that it helps her symptoms.  No prior imaging of the sinuses.   Her exam is significant for mild nasal congestion in the nasal passages, mucosal edema, but no polyps or purulence, there is no evidence of masses or lesions on flexible laryngoscopy.  She did have moderate postcricoid edema and pachydermia consistent with GERD LPR but no pooling of secretions in piriform sinuses.  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  We also discussed the possibility of temporal arteritis although she is not endorsing any vision changes and it is very unlikely that she has it.  Will obtain screening labs with ESR and CRP.  If positive will have her see her ophthalmology physician and consider further workup.  Another option is that her headaches are related to chronic sinus inflammation, we will obtain CT of the sinuses to rule out.  Her  throat symptoms are most likely due to undertreated reflux, and we will add alginate's to her current regimen with PPI.  Cannot rule out dysphagia based on her symptom description, will order MBS and esophagram to further evaluate.  She will have audiogram to rule out hearing loss due to evidence of no acute ear process and history of right-sided tinnitus.  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  All questions have been answered.  - Flonase and allergy pill  - start Reflux gourmet and continue reflux medication - schedule a swallow study (MBS/esophagram) - schedule CT sinuses  - schedule lab work (ESR CRP) - RTC 2 months Thank you for allowing me to participate in the care of this patient. Please do not hesitate to contact me with any questions or concerns.   Ashok Croon, MD Otolaryngology [facility] Phone: [phone] Fax: [phone]    02/24/2023, 10:26 AM

## 2023-02-26 ENCOUNTER — Telehealth (INDEPENDENT_AMBULATORY_CARE_PROVIDER_SITE_OTHER): Payer: Self-pay | Admitting: Otolaryngology

## 2023-02-26 DIAGNOSIS — L814 Other melanin hyperpigmentation: Secondary | ICD-10-CM | POA: Diagnosis not present

## 2023-02-26 DIAGNOSIS — L821 Other seborrheic keratosis: Secondary | ICD-10-CM | POA: Diagnosis not present

## 2023-02-26 DIAGNOSIS — D225 Melanocytic nevi of trunk: Secondary | ICD-10-CM | POA: Diagnosis not present

## 2023-02-26 DIAGNOSIS — R208 Other disturbances of skin sensation: Secondary | ICD-10-CM | POA: Diagnosis not present

## 2023-02-26 DIAGNOSIS — D2239 Melanocytic nevi of other parts of face: Secondary | ICD-10-CM | POA: Diagnosis not present

## 2023-02-26 DIAGNOSIS — L538 Other specified erythematous conditions: Secondary | ICD-10-CM | POA: Diagnosis not present

## 2023-02-26 DIAGNOSIS — D2272 Melanocytic nevi of left lower limb, including hip: Secondary | ICD-10-CM | POA: Diagnosis not present

## 2023-02-26 DIAGNOSIS — Z7189 Other specified counseling: Secondary | ICD-10-CM | POA: Diagnosis not present

## 2023-02-26 DIAGNOSIS — L820 Inflamed seborrheic keratosis: Secondary | ICD-10-CM | POA: Diagnosis not present

## 2023-02-26 DIAGNOSIS — L918 Other hypertrophic disorders of the skin: Secondary | ICD-10-CM | POA: Diagnosis not present

## 2023-02-26 NOTE — Telephone Encounter (Signed)
Melanie from [facility] called and asked [place]  to Auth Ct

## 2023-02-28 ENCOUNTER — Telehealth: Payer: Self-pay | Admitting: Otolaryngology

## 2023-02-28 ENCOUNTER — Other Ambulatory Visit: Payer: Self-pay | Admitting: Primary Care

## 2023-02-28 ENCOUNTER — Telehealth: Payer: Self-pay

## 2023-02-28 ENCOUNTER — Encounter: Payer: Medicare Other | Admitting: Primary Care

## 2023-02-28 ENCOUNTER — Other Ambulatory Visit (HOSPITAL_COMMUNITY): Payer: Medicare Other

## 2023-02-28 DIAGNOSIS — I10 Essential (primary) hypertension: Secondary | ICD-10-CM

## 2023-02-28 NOTE — Telephone Encounter (Signed)
Dr. Irene Pap has already placed the orders, thanks

## 2023-02-28 NOTE — Telephone Encounter (Signed)
Authorization #[id number] good from 8/9 to 03/29/2023

## 2023-02-28 NOTE — Telephone Encounter (Signed)
Pt called and wanted to know if you were putting in an order for the labwork you wanted done or can her PCP do that when she sees them on 03/26/23.  Please call her at [phone]

## 2023-02-28 NOTE — Telephone Encounter (Signed)
[facility] center called back and stated primary insurance doesn't require auth but secondary does. Pt has ct on 8/12.

## 2023-03-03 ENCOUNTER — Other Ambulatory Visit (HOSPITAL_COMMUNITY): Payer: Self-pay | Admitting: *Deleted

## 2023-03-03 ENCOUNTER — Inpatient Hospital Stay (HOSPITAL_COMMUNITY): Admission: RE | Admit: 2023-03-03 | Payer: Medicare Other | Source: Ambulatory Visit

## 2023-03-03 ENCOUNTER — Ambulatory Visit (HOSPITAL_COMMUNITY): Payer: Medicare Other

## 2023-03-03 DIAGNOSIS — R519 Headache, unspecified: Secondary | ICD-10-CM | POA: Diagnosis not present

## 2023-03-03 DIAGNOSIS — R1310 Dysphagia, unspecified: Secondary | ICD-10-CM

## 2023-03-06 ENCOUNTER — Encounter (INDEPENDENT_AMBULATORY_CARE_PROVIDER_SITE_OTHER): Payer: Self-pay

## 2023-03-10 ENCOUNTER — Ambulatory Visit (HOSPITAL_COMMUNITY)
Admission: RE | Admit: 2023-03-10 | Discharge: 2023-03-10 | Disposition: A | Discharge status: Home / Self Care | Payer: Medicare Other | Source: Ambulatory Visit | Attending: Otolaryngology | Admitting: Otolaryngology

## 2023-03-10 ENCOUNTER — Ambulatory Visit (HOSPITAL_COMMUNITY)
Admission: RE | Admit: 2023-03-10 | Discharge: 2023-03-10 | Disposition: A | Discharge status: Home / Self Care | Payer: Medicare Other | Source: Ambulatory Visit | Attending: Primary Care | Admitting: Primary Care

## 2023-03-10 DIAGNOSIS — R1310 Dysphagia, unspecified: Secondary | ICD-10-CM | POA: Diagnosis not present

## 2023-03-10 DIAGNOSIS — J329 Chronic sinusitis, unspecified: Secondary | ICD-10-CM | POA: Insufficient documentation

## 2023-03-10 DIAGNOSIS — R09A2 Foreign body sensation, throat: Secondary | ICD-10-CM | POA: Diagnosis not present

## 2023-03-10 DIAGNOSIS — R0989 Other specified symptoms and signs involving the circulatory and respiratory systems: Secondary | ICD-10-CM | POA: Diagnosis not present

## 2023-03-10 DIAGNOSIS — K219 Gastro-esophageal reflux disease without esophagitis: Secondary | ICD-10-CM | POA: Diagnosis not present

## 2023-03-10 DIAGNOSIS — J342 Deviated nasal septum: Secondary | ICD-10-CM | POA: Diagnosis not present

## 2023-03-10 NOTE — Therapy (Signed)
Modified Barium Swallow Study  Patient Details  Name: Marie Ruiz MRN: [id number] Date of Birth: 10/03/1945  Today's Date: 03/10/2023  Modified Barium Swallow completed.  Full report located under Chart Review in the Imaging Section.  History of Present Illness Marie Ruiz is a 77 y.o. female with PMH: GERD, LPR, DVT left leg, h/o LLE mass, HTN, HLD, graves disease, RLS, pre-diabetes, anxiety. She presented to ENT on 02/24/23 with c/o right ear/temple and occiput discomfort x1 year, throat discomfort, choking at times but not frequently. During that ENT visit, she had a laryngoscopy which reported "She did have moderate postcricoid edema and pachydermia consistent with GERD LPR but no pooling of secretions in piriform sinuses." ENT recommended MBS and Esophagram to further evaluate her swallow function.   Clinical Impression Patient presents with a mild pharyngeal phase dyspahgia as per this Modified Barium Swallow study. In addition, prominent cricopharyngeal bar observed but only mildly slowed barium transit through upper esophagus. Patient with only partial laryngeal elevation and partial anterior hyoid excursion. Swallow initiated at level of posterior surface of epiglottis as well as pyriform sinus with thin and nectar thick liquids. No penetration, aspiration and no pharyngeal residuals observed s/p initial swallows. No retrograde movement of barium in upper esophagus or at level of PES. SLP educated patient on importance of GERD management and she already reported she avoids spicy and acidic foods.   Swallow Evaluation Recommendations Recommendations: PO diet PO Diet Recommendation: Regular;Thin liquids (Level 0) Liquid Administration via: Cup;Straw Medication Administration: Whole meds with liquid Supervision: Patient able to self-feed Postural changes: Stay upright 30-60 min after meals;Position pt fully upright for meals    Angela Nevin, MA, CCC-SLP Speech  Therapy

## 2023-03-12 ENCOUNTER — Ambulatory Visit: Payer: Medicare Other | Attending: Otolaryngology | Admitting: Audiologist

## 2023-03-12 ENCOUNTER — Other Ambulatory Visit: Payer: Self-pay | Admitting: Primary Care

## 2023-03-12 DIAGNOSIS — H9201 Otalgia, right ear: Secondary | ICD-10-CM

## 2023-03-12 DIAGNOSIS — H903 Sensorineural hearing loss, bilateral: Secondary | ICD-10-CM | POA: Insufficient documentation

## 2023-03-12 DIAGNOSIS — H9193 Unspecified hearing loss, bilateral: Secondary | ICD-10-CM

## 2023-03-12 HISTORY — DX: Unspecified hearing loss, bilateral: H91.93

## 2023-03-12 NOTE — Procedures (Signed)
  [facility] [address] [place], [place]  [address] [phone]  AUDIOLOGICAL  EVALUATION  NAME: Marie Ruiz     DOB:   1946-04-07      MRN: [id number]                                                                                     DATE: 03/12/2023     REFERENT: Doreene Nest, NP STATUS: Outpatient DIAGNOSIS: Asymmetric Sensorineural Hearing Loss     History: Terralyn was seen for an audiological evaluation. Laneah is receiving a hearing evaluation due to concerns for pain deep in her right ear that began three month ago.  It feels like an intermittent dill ache deep in her ear. She said she cannot tell if she has tinnitus or if she is just hearing a ring tone. No history of ear infections, ear surgery, or vertigo.  No other relevant case history reported. Valisha was seen first by Dr. Irene Pap, Otolaryngologist, who made today's referral.  Evaluation:  Otoscopy showed a clear view of the tympanic membranes, bilaterally Tympanometry results were consistent with normal middle ear function, bilaterally   Audiometric testing was completed using conventional audiometry with insert and high frequency transducer. Speech Recognition Thresholds were 15dB in the right ear and 20dB in the left ear. Word Recognition was performed 40dB SL, scored 100% in the right ear and 92% in the left ear. 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 See audiogram below.   Results:  The test results were reviewed with [facility]. She has an asymmetric sensorineural high frequency hearing loss. The left ear sloped after 2kHz to a moderate severe sensorineural hearing loss. The pain is deep in her right ear. Recommend she follow up with Otolaryngology at already scheduled visit and bring today's audiogram. Monitoring of hearing is needed due to asymmetry.      Recommendations: Follow up with Otolaryngology on  04/25/2023.  If no medical pathology found, recommend annual audiometric evaluation to monitor loss progression.     28 minutes spent testing and counseling on results.   Ammie Ferrier  Audiologist, Au.D., CCC-A 03/12/2023  11:40 AM  Cc: Doreene Nest, NP

## 2023-03-26 ENCOUNTER — Ambulatory Visit: Payer: Medicare Other | Admitting: Primary Care

## 2023-03-26 ENCOUNTER — Encounter: Payer: Self-pay | Admitting: Primary Care

## 2023-03-26 VITALS — BP 126/84 | HR 60 | Temp 97.7°F | Ht 65.5 in | Wt 199.0 lb

## 2023-03-26 DIAGNOSIS — G4700 Insomnia, unspecified: Secondary | ICD-10-CM

## 2023-03-26 DIAGNOSIS — M79609 Pain in unspecified limb: Secondary | ICD-10-CM

## 2023-03-26 DIAGNOSIS — M8580 Other specified disorders of bone density and structure, unspecified site: Secondary | ICD-10-CM

## 2023-03-26 DIAGNOSIS — I700 Atherosclerosis of aorta: Secondary | ICD-10-CM

## 2023-03-26 DIAGNOSIS — Z0001 Encounter for general adult medical examination with abnormal findings: Secondary | ICD-10-CM

## 2023-03-26 DIAGNOSIS — E782 Mixed hyperlipidemia: Secondary | ICD-10-CM | POA: Diagnosis not present

## 2023-03-26 DIAGNOSIS — I7121 Aneurysm of the ascending aorta, without rupture: Secondary | ICD-10-CM

## 2023-03-26 DIAGNOSIS — E0500 Thyrotoxicosis with diffuse goiter without thyrotoxic crisis or storm: Secondary | ICD-10-CM

## 2023-03-26 DIAGNOSIS — H409 Unspecified glaucoma: Secondary | ICD-10-CM

## 2023-03-26 DIAGNOSIS — J029 Acute pharyngitis, unspecified: Secondary | ICD-10-CM | POA: Insufficient documentation

## 2023-03-26 DIAGNOSIS — F411 Generalized anxiety disorder: Secondary | ICD-10-CM

## 2023-03-26 DIAGNOSIS — Z0000 Encounter for general adult medical examination without abnormal findings: Secondary | ICD-10-CM

## 2023-03-26 DIAGNOSIS — I10 Essential (primary) hypertension: Secondary | ICD-10-CM

## 2023-03-26 DIAGNOSIS — K2100 Gastro-esophageal reflux disease with esophagitis, without bleeding: Secondary | ICD-10-CM

## 2023-03-26 LAB — TSH: TSH: 2.21 u[IU]/mL (ref 0.35–5.50)

## 2023-03-26 LAB — LIPID PANEL
Cholesterol: 213 mg/dL — ABNORMAL HIGH (ref 0–200)
HDL: 60 mg/dL (ref >39.00)
LDL Cholesterol: 107 mg/dL — ABNORMAL HIGH (ref 0–99)
NonHDL: 153.11
Total CHOL/HDL Ratio: 4
Triglycerides: 233 mg/dL — ABNORMAL HIGH (ref 0.0–149.0)
VLDL: 46.6 mg/dL — ABNORMAL HIGH (ref 0.0–40.0)

## 2023-03-26 LAB — T4, FREE: Free T4: 0.93 ng/dL (ref 0.60–1.60)

## 2023-03-26 LAB — COMPREHENSIVE METABOLIC PANEL
ALT: 13 U/L (ref 0–35)
AST: 19 U/L (ref 0–37)
Albumin: 4.4 g/dL (ref 3.5–5.2)
Alkaline Phosphatase: 55 U/L (ref 39–117)
BUN: 12 mg/dL (ref 6–23)
CO2: 30 meq/L (ref 19–32)
Calcium: 10 mg/dL (ref 8.4–10.5)
Chloride: 102 meq/L (ref 96–112)
Creatinine, Ser: 0.71 mg/dL (ref 0.40–1.20)
GFR: 82.18 mL/min (ref >60.00)
Glucose, Bld: 98 mg/dL (ref 70–99)
Potassium: 4.6 meq/L (ref 3.5–5.1)
Sodium: 138 meq/L (ref 135–145)
Total Bilirubin: 0.5 mg/dL (ref 0.2–1.2)
Total Protein: 7.5 g/dL (ref 6.0–8.3)

## 2023-03-26 LAB — POC COVID19 BINAXNOW: SARS Coronavirus 2 Ag: NEGATIVE

## 2023-03-26 NOTE — Assessment & Plan Note (Signed)
Continue rosuvastatin 5 mg daily. Repeat lipid panel pending 

## 2023-03-26 NOTE — Progress Notes (Signed)
Subjective:    Patient ID: Marie Ruiz, female    DOB: 19-Jan-1946, 77 y.o.   MRN: [id number]  HPI  Marie Ruiz is a very pleasant 77 y.o. female who presents today for complete physical and follow up of chronic conditions.  She would also like to discuss sore throat.  Exposed to COVID-19 infection 6 days ago from her friend.  Symptoms of sore throat with fatigue began 4 days ago.  Her sore throat resolved after gargling salt water.  She denies fevers, chills, cough, body aches.  She has not tested for COVID-19 infection.  Immunizations: -Tetanus: Completed in 2017 -Influenza: Declines today.  -Shingles: Completed Shingrix series -Pneumonia: Completed Prevnar 13 in 2017, Pneumovax 23 in 2015  Diet: Fair diet.  Exercise: No regular exercise.  Eye exam: Completes annually  Dental exam: Completes semi-annually    Mammogram: Completed in May 2024 Bone Density Scan: Completed in May 2022, scheduled for October 2024  Colonoscopy: Completed in 2021, due 2031 if applicable.   BP Readings from Last 3 Encounters:  03/26/23 126/84  02/24/23 108/70  01/28/23 112/72      Review of Systems  Constitutional:  Positive for fatigue. Negative for unexpected weight change.  HENT:  Negative for rhinorrhea.   Respiratory:  Negative for cough and shortness of breath.   Cardiovascular:  Negative for chest pain.  Gastrointestinal:  Negative for constipation and diarrhea.  Genitourinary:  Negative for difficulty urinating.  Musculoskeletal:  Positive for arthralgias.  Skin:  Negative for rash.  Allergic/Immunologic: Negative for environmental allergies.  Neurological:  Negative for dizziness and headaches.  Psychiatric/Behavioral:  The patient is not nervous/anxious.          Past Medical History:  Diagnosis Date   Acute diarrhea 01/28/2023   Allergy    seasonal   Anxiety    Aortic aneurysm (HCC) 08/2018   Arthritis    Blood clotting disorder (HCC)    Bone spur     with excision   Chickenpox    Colon polyps    DVT (deep venous thrombosis) (HCC)    Glaucoma    both   Graves disease 03/30/2013   History of blood clots 10/2017   Hyperlipemia    Hypertension    Osteopenia    Prediabetes    Restless leg syndrome     Social History   Socioeconomic History   Marital status: Married    Spouse name: Not on file   Number of children: Not on file   Years of education: Not on file   Highest education level: Not on file  Occupational History   Occupation: Retired  Tobacco Use   Smoking status: Former    Current packs/day: 0.00    Types: Cigarettes    Quit date: 1990    Years since quitting: 34.6   Smokeless tobacco: Never  Vaping Use   Vaping status: Never Used  Substance and Sexual Activity   Alcohol use: Yes    Alcohol/week: 2.0 standard drinks of alcohol    Types: 2 Standard drinks or equivalent per week   Drug use: Not Currently   Sexual activity: Not Currently  Other Topics Concern   Not on file  Social History Narrative   Lives in [place], moving to [place].   Retired.   Married.    Social Determinants of Health   Financial Resource Strain: Low Risk  (10/31/2022)   Overall Financial Resource Strain (CARDIA)    Difficulty of Paying Living Expenses: Not  hard at all  Food Insecurity: No Food Insecurity (10/31/2022)   Hunger Vital Sign    Worried About Running Out of Food in the Last Year: Never true    Ran Out of Food in the Last Year: Never true  Transportation Needs: No Transportation Needs (10/31/2022)   PRAPARE - Administrator, Civil Service (Medical): No    Lack of Transportation (Non-Medical): No  Physical Activity: Inactive (10/31/2022)   Exercise Vital Sign    Days of Exercise per Week: 0 days    Minutes of Exercise per Session: 0 min  Stress: No Stress Concern Present (10/31/2022)   [employer] of Occupational Health - Occupational Stress Questionnaire    Feeling of Stress : Not at all  Social  Connections: Socially Integrated (10/31/2022)   Social Connection and Isolation Panel [NHANES]    Frequency of Communication with Friends and Family: More than three times a week    Frequency of Social Gatherings with Friends and Family: More than three times a week    Attends Religious Services: More than 4 times per year    Active Member of [employer] or Organizations: Yes    Attends Engineer, structural: More than 4 times per year    Marital Status: Married  Catering manager Violence: Not At Risk (10/31/2022)   Humiliation, Afraid, Rape, and Kick questionnaire    Fear of Current or Ex-Partner: No    Emotionally Abused: No    Physically Abused: No    Sexually Abused: No    Past Surgical History:  Procedure Laterality Date   BREAST CYST ASPIRATION     CATARACT EXTRACTION W/ INTRAOCULAR LENS IMPLANT Right 2015   CATARACT EXTRACTION W/PHACO Left 2017   COLONOSCOPY  2014   DILATION AND CURETTAGE OF UTERUS      Family History  Problem Relation Age of Onset   Heart disease Mother    Heart attack Father    Breast cancer Sister 14   Colon polyps Sister    Aneurysm Brother    Multiple myeloma Sister        found in stage 4   Breast cancer Maternal Grandmother    Esophageal cancer Maternal Aunt 82   Colon cancer Neg Hx    Rectal cancer Neg Hx    Stomach cancer Neg Hx     Allergies  Allergen Reactions   Amitriptyline Other (See Comments)    hallucinations   Codeine    Crestor [Rosuvastatin]     Myalgias    Lipitor [Atorvastatin]     Myaglias    Prednisone     Depression    Current Outpatient Medications on File Prior to Visit  Medication Sig Dispense Refill   acetaminophen (TYLENOL) 500 MG tablet Take 500 mg by mouth as needed for mild pain.     amLODipine-benazepril (LOTREL) 5-20 MG capsule TAKE 1 CAPSULE BY MOUTH DAILY. FOR BLOOD PRESSURE. 90 capsule 0   aspirin EC 81 MG tablet Take 81 mg by mouth daily.     b complex vitamins capsule Take 1 capsule by mouth  daily.     brimonidine (ALPHAGAN) 0.2 % ophthalmic solution 1 drop 2 (two) times daily.     Calcium Carbonate-Vit D-Min (CALCIUM 1200 PO) Take by mouth.     cetirizine (ZYRTEC) 10 MG tablet Take 10 mg by mouth daily.     cholecalciferol (VITAMIN D) 1000 units tablet Take 1,000 Units by mouth daily.     co-enzyme Q-10 30 MG  capsule Take 30 mg by mouth 3 (three) times daily.     cyanocobalamin (VITAMIN B12) 1000 MCG tablet Take 1,000 mcg by mouth daily.     dorzolamide-timolol (COSOPT) 22.3-6.8 MG/ML ophthalmic solution Place 1 drop into both eyes 2 (two) times daily.     FLUoxetine (PROZAC) 20 MG capsule TAKE 1 CAPSULE (20 MG TOTAL) BY MOUTH DAILY. FOR ANXIETY 90 capsule 0   fluticasone (FLONASE) 50 MCG/ACT nasal spray PLACE 1 SPRAY INTO BOTH NOSTRILS 2 (TWO) TIMES DAILY 48 mL 0   MAGNESIUM PO Take by mouth.     Multiple Vitamin (MULTIVITAMIN) tablet Take 1 tablet by mouth daily.     OVER THE COUNTER MEDICATION Blue - Emu     OVER THE COUNTER MEDICATION Bio true     pantoprazole (PROTONIX) 20 MG tablet Take 1 tablet (20 mg total) by mouth 2 (two) times daily before a meal. for heartburn. 180 tablet 0   rosuvastatin (CRESTOR) 5 MG tablet Take 1 tablet (5 mg total) by mouth daily. 90 tablet 3   traZODone (DESYREL) 50 MG tablet Take 1 tablet (50 mg total) by mouth at bedtime as needed for sleep. 90 tablet 1   trolamine salicylate (ASPERCREME) 10 % cream Apply 1 application  topically as needed for muscle pain.     Evolocumab (REPATHA SURECLICK) 140 MG/ML SOAJ Inject 140 mg into the skin every 14 (fourteen) days. (Patient not taking: Reported on 02/24/2023) 6 mL 3   sucralfate (CARAFATE) 1 g tablet Take 1 tablet (1 g total) by mouth 4 (four) times daily -  with meals and at bedtime for 10 days. 40 tablet 0   No current facility-administered medications on file prior to visit.    BP 126/84   Pulse 60   Temp 97.7 F (36.5 C) (Temporal)   Ht 5' 5.5" (1.664 m)   Wt 199 lb (90.3 kg)   SpO2 100%    BMI 32.61 kg/m  Objective:   Physical Exam HENT:     Right Ear: Tympanic membrane and ear canal normal.     Left Ear: Tympanic membrane and ear canal normal.     Nose: Nose normal.  Eyes:     Conjunctiva/sclera: Conjunctivae normal.     Pupils: Pupils are equal, round, and reactive to light.  Neck:     Thyroid: No thyromegaly.  Cardiovascular:     Rate and Rhythm: Normal rate and regular rhythm.     Heart sounds: No murmur heard. Pulmonary:     Effort: Pulmonary effort is normal.     Breath sounds: Normal breath sounds. No rales.  Abdominal:     General: Bowel sounds are normal.     Palpations: Abdomen is soft.     Tenderness: There is no abdominal tenderness.  Musculoskeletal:        General: Normal range of motion.     Cervical back: Neck supple.  Lymphadenopathy:     Cervical: No cervical adenopathy.  Skin:    General: Skin is warm and dry.     Findings: No rash.  Neurological:     Mental Status: She is alert and oriented to person, place, and time.     Cranial Nerves: No cranial nerve deficit.     Deep Tendon Reflexes: Reflexes are normal and symmetric.  Psychiatric:        Mood and Affect: Mood normal.           Assessment & Plan:  Preventative health care Assessment &  Plan: Immunizations UTD.  Declines influenza vaccine today. Pap smear UTD. Mammogram up-to-date. Bone density scan due, scheduled for October Colonoscopy UTD, due 2031  Discussed the importance of a healthy diet and regular exercise in order for weight loss, and to reduce the risk of further co-morbidity.  Exam stable. Labs pending.  Follow up in 1 year for repeat physical.    Aortic atherosclerosis (HCC) Assessment & Plan: Continue rosuvastatin 5 mg daily. Repeat lipid panel pending.  Orders: -     Lipid panel  Aneurysm of ascending aorta without rupture [facility]) Assessment & Plan: Following with cardiology. Reviewed office notes from May 2024. Reviewed echocardiogram from  June 2024.   Essential hypertension Assessment & Plan: Controlled.  Continue amlodipine-benazepril 5-20 mg daily.  CMP pending.   Gastroesophageal reflux disease with esophagitis without hemorrhage Assessment & Plan: Improving.  Continue pantoprazole 20 mg BID. Continue Carafate 1 g QID PRN.    Graves disease Assessment & Plan: Historically controlled without treatment. Asymptomatic Repeat thyroid studies pending.  Orders: -     TSH -     T4, free  Osteopenia, unspecified location Assessment & Plan: Repeat bone density scan pending.   GAD (generalized anxiety disorder) Assessment & Plan: Controlled.  Continue fluoxetine 20 mg daily, trazodone 50 mg at bedtime as needed.   Glaucoma, unspecified glaucoma type, unspecified laterality Assessment & Plan: Eye exam up-to-date.  Continue Alphagan 0.2% drops, Cosopt 2-0.5% drops Following with ophthalmology.   Mixed hyperlipidemia Assessment & Plan: Repeat lipid panel pending.  Continue rosuvastatin 5 mg daily.  Orders: -     Lipid panel -     Comprehensive metabolic panel  Insomnia, unspecified type Assessment & Plan: Overall controlled.  Continue trazodone 50 mg as needed.   Popliteal pain Assessment & Plan: Reviewed MRI results with patient.  Discussed Tylenol versus Voltaren gel versus referral to orthopedics for knee injection. She will consider setting up an appoint with sports medicine.   Sore throat Assessment & Plan: Exam today benign.  Rapid COVID-19 test negative. Fortunately, symptoms have resolved.  Orders: -     POC COVID-19 BinaxNow        Doreene Nest, NP

## 2023-03-26 NOTE — Assessment & Plan Note (Signed)
Historically controlled without treatment. Asymptomatic Repeat thyroid studies pending.

## 2023-03-26 NOTE — Assessment & Plan Note (Signed)
Controlled.  Continue amlodipine-benazepril 5-20 mg daily  CMP pending.

## 2023-03-26 NOTE — Assessment & Plan Note (Signed)
Immunizations UTD.  Declines influenza vaccine today. Pap smear UTD. Mammogram up-to-date. Bone density scan due, scheduled for October Colonoscopy UTD, due 2031  Discussed the importance of a healthy diet and regular exercise in order for weight loss, and to reduce the risk of further co-morbidity.  Exam stable. Labs pending.  Follow up in 1 year for repeat physical.

## 2023-03-26 NOTE — Assessment & Plan Note (Signed)
Repeat lipid panel pending. Continue rosuvastatin 5 mg daily.

## 2023-03-26 NOTE — Assessment & Plan Note (Signed)
Following with cardiology. Reviewed office notes from May 2024. Reviewed echocardiogram from June 2024.

## 2023-03-26 NOTE — Assessment & Plan Note (Signed)
Eye exam up-to-date.  Continue Alphagan 0.2% drops, Cosopt 2-0.5% drops Following with ophthalmology.

## 2023-03-26 NOTE — Assessment & Plan Note (Signed)
Improving.  Continue pantoprazole 20 mg BID. Continue Carafate 1 g QID PRN.

## 2023-03-26 NOTE — Patient Instructions (Signed)
Stop by the lab prior to leaving today. I will notify you of your results once received.   Schedule an appointment with sports medicine for your knee as discussed.  It was a pleasure to see you today!

## 2023-03-26 NOTE — Assessment & Plan Note (Signed)
Reviewed MRI results with patient.  Discussed Tylenol versus Voltaren gel versus referral to orthopedics for knee injection. She will consider setting up an appoint with sports medicine.

## 2023-03-26 NOTE — Assessment & Plan Note (Signed)
Controlled.  Continue fluoxetine 20 mg daily, trazodone 50 mg at bedtime as needed.

## 2023-03-26 NOTE — Assessment & Plan Note (Signed)
Overall controlled.  Continue trazodone 50 mg as needed.

## 2023-03-26 NOTE — Assessment & Plan Note (Signed)
Exam today benign.  Rapid COVID-19 test negative. Fortunately, symptoms have resolved.

## 2023-03-26 NOTE — Assessment & Plan Note (Signed)
Repeat bone density scan pending. °

## 2023-03-28 DIAGNOSIS — Z961 Presence of intraocular lens: Secondary | ICD-10-CM | POA: Diagnosis not present

## 2023-03-28 DIAGNOSIS — H401133 Primary open-angle glaucoma, bilateral, severe stage: Secondary | ICD-10-CM | POA: Diagnosis not present

## 2023-03-28 DIAGNOSIS — H04123 Dry eye syndrome of bilateral lacrimal glands: Secondary | ICD-10-CM | POA: Diagnosis not present

## 2023-03-28 DIAGNOSIS — H16223 Keratoconjunctivitis sicca, not specified as Sjogren's, bilateral: Secondary | ICD-10-CM | POA: Diagnosis not present

## 2023-04-22 ENCOUNTER — Other Ambulatory Visit: Payer: Self-pay | Admitting: Primary Care

## 2023-04-22 DIAGNOSIS — F411 Generalized anxiety disorder: Secondary | ICD-10-CM

## 2023-04-25 ENCOUNTER — Other Ambulatory Visit: Payer: Self-pay | Admitting: Primary Care

## 2023-04-25 ENCOUNTER — Ambulatory Visit (INDEPENDENT_AMBULATORY_CARE_PROVIDER_SITE_OTHER): Payer: Medicare Other | Admitting: Otolaryngology

## 2023-04-25 ENCOUNTER — Encounter (INDEPENDENT_AMBULATORY_CARE_PROVIDER_SITE_OTHER): Payer: Self-pay | Admitting: Otolaryngology

## 2023-04-25 VITALS — BP 133/85 | HR 63 | Ht 65.0 in

## 2023-04-25 DIAGNOSIS — R1313 Dysphagia, pharyngeal phase: Secondary | ICD-10-CM

## 2023-04-25 DIAGNOSIS — H918X3 Other specified hearing loss, bilateral: Secondary | ICD-10-CM

## 2023-04-25 DIAGNOSIS — J329 Chronic sinusitis, unspecified: Secondary | ICD-10-CM | POA: Diagnosis not present

## 2023-04-25 DIAGNOSIS — H9311 Tinnitus, right ear: Secondary | ICD-10-CM | POA: Diagnosis not present

## 2023-04-25 DIAGNOSIS — R1310 Dysphagia, unspecified: Secondary | ICD-10-CM

## 2023-04-25 DIAGNOSIS — M26621 Arthralgia of right temporomandibular joint: Secondary | ICD-10-CM

## 2023-04-25 DIAGNOSIS — R09A2 Foreign body sensation, throat: Secondary | ICD-10-CM

## 2023-04-25 DIAGNOSIS — R519 Headache, unspecified: Secondary | ICD-10-CM

## 2023-04-25 DIAGNOSIS — H9201 Otalgia, right ear: Secondary | ICD-10-CM

## 2023-04-25 DIAGNOSIS — K219 Gastro-esophageal reflux disease without esophagitis: Secondary | ICD-10-CM | POA: Diagnosis not present

## 2023-04-25 DIAGNOSIS — R609 Edema, unspecified: Secondary | ICD-10-CM

## 2023-04-25 DIAGNOSIS — K2100 Gastro-esophageal reflux disease with esophagitis, without bleeding: Secondary | ICD-10-CM

## 2023-04-25 MED ORDER — FLUTICASONE PROPIONATE 50 MCG/ACT NA SUSP: 2.0000 | Freq: Two times a day (BID) | NASAL | 6 refills | Status: DC

## 2023-04-25 MED ORDER — CETIRIZINE HCL 10 MG PO TABS: 10.0000 mg | ORAL_TABLET | Freq: Every day | ORAL | 11 refills | Status: DC

## 2023-04-25 NOTE — Patient Instructions (Addendum)
-   MRI IAC to rule out a lesion on the hearing nerve due to history of asymmetric hearing loss - continue Zyrtec and Flonase - start nasal rinses  - return after imaging  - continue reflux gourmet  - return after imaging   Lloyd Huger Med Nasal Saline Rinse   - start nasal saline rinses with [employer] available over the counter or online to help with nasal congestion

## 2023-04-25 NOTE — Progress Notes (Signed)
ENT Progress Note:  Update 04/25/23 She returns for follow-up after hearing evaluation, swallow study and CT of the sinuses.  She reports that her symptoms are somewhat better, enjoying reflux Gourmet chocolate mint flavor, sore throat improved, but she continues to have right ear discomfort.   Initial Evaluation 02/24/23  Reason for Consult: right ear/temple and occiput discomfort x 1 year, throat discomfort   HPI: Marie Ruiz is an 77 y.o. female with hx of GERD, recently started Pantoprazole x 1 mo, improved after she started it, epigastric pain, was previously on carafate, and hx of DVT left leg 2018, previously on Xarelto, hx of left lower extremity mass, MRI report pending, here for multiple complaints  Right ear discomfort - sensation of dull achy feeling in right ear, no hearing changes, denies frequent ear popping. She has intermittent tinnitus. Denies bruxism. Denies hx migraines, but she does have tension headaches sometimes, typically on the right side. She has hx of glaucoma. She sees Ophtho 6 months. No ear surgeries. No ear drainage.   Throat discomfort x few months. She has choking at times, but not frequently. She rates it at 4/10, usually twice a week, and feels as dull pain. She is tolerating a regular diet.   Records Reviewed:  PCP note - hx of DVT LLE dx 2020 stopped Xarelto    Past Medical History:  Diagnosis Date   Acute diarrhea 01/28/2023   Allergy    seasonal   Anxiety    Aortic aneurysm (HCC) 08/2018   Arthritis    Blood clotting disorder (HCC)    Bone spur    with excision   Chickenpox    Colon polyps    DVT (deep venous thrombosis) (HCC)    Glaucoma    both   Graves disease 03/30/2013   History of blood clots 10/2017   Hyperlipemia    Hypertension    Osteopenia    Prediabetes    Restless leg syndrome     Past Surgical History:  Procedure Laterality Date   BREAST CYST ASPIRATION     CATARACT EXTRACTION W/ INTRAOCULAR LENS IMPLANT Right  2015   CATARACT EXTRACTION W/PHACO Left 2017   COLONOSCOPY  2014   DILATION AND CURETTAGE OF UTERUS      Family History  Problem Relation Age of Onset   Heart disease Mother    Heart attack Father    Breast cancer Sister 10   Colon polyps Sister    Aneurysm Brother    Multiple myeloma Sister        found in stage 4   Breast cancer Maternal Grandmother    Esophageal cancer Maternal Aunt 82   Colon cancer Neg Hx    Rectal cancer Neg Hx    Stomach cancer Neg Hx     Social History:  reports that she quit smoking about 34 years ago. Her smoking use included cigarettes. She has never used smokeless tobacco. She reports current alcohol use of about 2.0 standard drinks of alcohol per week. She reports that she does not currently use drugs.  Allergies:  Allergies  Allergen Reactions   Amitriptyline Other (See Comments)    hallucinations   Codeine    Crestor [Rosuvastatin]     Myalgias    Lipitor [Atorvastatin]     Myaglias    Prednisone     Depression    Medications: I have reviewed the patient's current medications.  The PMH, PSH, Medications, Allergies, and SH were reviewed and updated.  ROS:  Constitutional: Negative for fever, weight loss and weight gain. Cardiovascular: Negative for chest pain and dyspnea on exertion. Respiratory: Is not experiencing shortness of breath at rest. Gastrointestinal: Negative for nausea and vomiting. Neurological: Negative for headaches. Psychiatric: The patient is not nervous/anxious  Blood pressure 133/85, pulse 63, height 5\' 5"  (1.651 m), SpO2 97%.  PHYSICAL EXAM:  Exam: General: Well-developed, well-nourished Respiratory Respiratory effort: Equal inspiration and expiration without stridor Cardiovascular Peripheral Vascular: Warm extremities with equal color/perfusion Eyes: No nystagmus with equal extraocular motion bilaterally Neuro/Psych/Balance: Patient oriented to person, place, and time; Appropriate mood and affect; Gait  is intact with no imbalance; Cranial nerves I-XII are intact Head and Face Inspection: Normocephalic and atraumatic without mass or lesion Facial Strength: Facial motility symmetric and full bilaterally ENT Pinna: External ear intact and fully developed External canal: Canal is patent with intact skin External Nose: No scar or anatomic deformity TMJ: R TMJ with pain to palpation with full mobility Neck Neck and Trachea: Midline trachea without mass or lesion   Procedure performed during last office visit on 02/24/23 :  Preoperative diagnosis: odynophagia and throat discomfort  Postoperative diagnosis:   Same + GERD LPR  Procedure: Flexible fiberoptic laryngoscopy  Surgeon: Ashok Croon, MD  Anesthesia: Topical lidocaine and Afrin Complications: None Condition is stable throughout exam  Indications and consent:  The patient presents to the clinic with Indirect laryngoscopy view was incomplete. Thus it was recommended that they undergo a flexible fiberoptic laryngoscopy. All of the risks, benefits, and potential complications were reviewed with the patient preoperatively and verbal informed consent was obtained.  Procedure: The patient was seated upright in the clinic. Topical lidocaine and Afrin were applied to the nasal cavity. After adequate anesthesia had occurred, I then proceeded to pass the flexible telescope into the nasal cavity. The nasal cavity was patent without rhinorrhea or polyp. The nasopharynx was also patent without mass or lesion. The base of tongue was visualized and was normal. There were no signs of pooling of secretions in the piriform sinuses. The true vocal folds were mobile bilaterally. There were no signs of glottic or supraglottic mucosal lesion or mass. There was moderate interarytenoid pachydermia and post cricoid edema. The telescope was then slowly withdrawn and the patient tolerated the procedure throughout.     Studies Reviewed: 02/04/23 LLE venous  U/S EXAM: LEFT LOWER EXTREMITY VENOUS DOPPLER ULTRASOUND   TECHNIQUE: Gray-scale sonography with graded compression, as well as color Doppler and duplex ultrasound were performed to evaluate the lower extremity deep venous systems from the level of the common femoral vein and including the common femoral, femoral, profunda femoral, popliteal and calf veins including the posterior tibial, peroneal and gastrocnemius veins when visible. The superficial great saphenous vein was also interrogated. Spectral Doppler was utilized to evaluate flow at rest and with distal augmentation maneuvers in the common femoral, femoral and popliteal veins.   COMPARISON:  None Available.   FINDINGS: Contralateral Common Femoral Vein: Respiratory phasicity is normal and symmetric with the symptomatic side. No evidence of thrombus. Normal compressibility.   Common Femoral Vein: No evidence of thrombus. Normal compressibility, respiratory phasicity and response to augmentation.   Saphenofemoral Junction: No evidence of thrombus. Normal compressibility and flow on color Doppler imaging.   Profunda Femoral Vein: No evidence of thrombus. Normal compressibility and flow on color Doppler imaging.   Femoral Vein: No evidence of thrombus. Normal compressibility, respiratory phasicity and response to augmentation.   Popliteal Vein: No evidence of thrombus. Normal compressibility, respiratory  phasicity and response to augmentation.   Calf Veins: No evidence of thrombus. Normal compressibility and flow on color Doppler imaging.   Superficial Great Saphenous Vein: No evidence of thrombus. Normal compressibility.   Other Findings: Sonographic evaluation of patient's area of discomfort involving the posterior aspect of the distal thigh demonstrates a 1.2 x 0.9 x 0.9 cm anechoic apparent fluid collection about the posterior distal aspect of the femur, reportedly not in communication with the popliteal space  to suggest the presence of a Baker's cyst.   Otherwise, there is no sonographic correlate for patient's area of discomfort involving the posterior aspect of the distal left thigh. Specifically, no regional superficial thrombophlebitis.   IMPRESSION: 1. No evidence of acute or chronic DVT within the left lower extremity. 2. Sonographic evaluation of patient's area of discomfort involving the posterior aspect of the distal left thigh demonstrates a 1.2 cm anechoic apparent fluid collection about the posterior distal aspect of the femur, reportedly not in communication with the popliteal space to suggest this fluid collection represents a Baker's cyst. While nonspecific, this structure could represent an inflamed bursal sac, hematoma or seroma. Clinical correlation is advised. Further evaluation of the knee MRI could be performed as indicated  Audiology 03/12/23 Asymmetric sensorineural hearing loss, left ear that's worse despite her pain being in the right ear. 100% WDS. Mild sloping to moderate SNHL. Type A tymps AU.   CT sinuses 03/10/23 FINDINGS: Paranasal sinuses:   Frontal: Normally aerated. Patent frontal sinus drainage pathways.   Ethmoid: Mild scattered mucosal thickening bilaterally. Secretions in an anterior right ethmoid air cell.   Maxillary: Moderate mucosal thickening bilaterally.   Sphenoid: Small amount of secretions in the lateral recess of the left sphenoid sinus. Patent sphenoethmoidal recesses.   Right ostiomeatal unit: Opacified infundibulum. Patent accessory maxillary sinus ostium.   Left ostiomeatal unit: Opacified infundibulum. Patent accessory maxillary sinus ostium.   Nasal passages: Patent. 2 mm rightward deviation of an intact nasal septum.   Anatomy: No pneumatization superior to anterior ethmoid notches. Keros II. Sellar sphenoid pneumatization pattern. No dehiscence of carotid or optic canals. No onodi cell.   Other: Clear mastoid air cells  and middle ear cavities. Bilateral cataract extraction. Moderately advanced disc degeneration at C3-4 and C5-6. No acute finding in the included portion of the brain.   IMPRESSION: Inflammatory sinus disease as detailed above, greatest in the maxillary sinuses.  Studies I personally reviewed include MBS/esophagram study and CT max/face study  Assessment/Plan: Encounter Diagnoses  Name Primary?   Asymmetrical hearing loss    Odynophagia Yes   Arthralgia of right temporomandibular joint    Right ear pain    Globus pharyngeus    Chronic sinusitis, unspecified location    Gastroesophageal reflux disease without esophagitis    Nonintractable headache, unspecified chronicity pattern, unspecified headache type    Tinnitus of right ear      77 year old female with a recent diagnosis of GERD LPR recently started on pantoprazole, reports symptoms of chest discomfort and epigastric discomfort for which she was also given Carafate currently does not take it anymore, history of tension type headaches that typically presents with right sided headache, history of glaucoma followed by ophthalmology, who is here for chronic right ear discomfort pain in the right temple area both are intermittent in nature and come and go described as dull achy pain.  She denies acute changes in her vision with the symptoms.  She also denies history of recurrent ear infections or ear drainage or  ear surgeries.  Right-sided tinnitus but no significant ringing on the other side.  She is also here for chronic throat discomfort, present only sometimes, and intermittent choking on foods or liquids which only happens once or twice a week.  Tolerating regular diet.  No prior swallow studies.  She has history of nasal congestion and thinks her headaches are related to chronic nasal congestion and sinus inflammation.  Started over-the-counter Flonase and allergy medication, and feels that it helps her symptoms.  No prior imaging of  the sinuses.   Her exam is significant for mild nasal congestion in the nasal passages, mucosal edema, but no polyps or purulence, there is no evidence of masses or lesions on flexible laryngoscopy.  She did have moderate postcricoid edema and pachydermia consistent with GERD LPR but no pooling of secretions in piriform sinuses.  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  We also discussed the possibility of temporal arteritis although she is not endorsing any vision changes and it is very unlikely that she has it.  Will obtain screening labs with ESR and CRP.  If positive will have her see her ophthalmology physician and consider further workup.  Another option is that her headaches are related to chronic sinus inflammation, we will obtain CT of the sinuses to rule out.  Her throat symptoms are most likely due to undertreated reflux, and we will add alginate's to her current regimen with PPI.  Cannot rule out dysphagia based on her symptom description, will order MBS and esophagram to further evaluate.  She will have audiogram to rule out hearing loss due to evidence of no acute ear process and history of right-sided tinnitus.  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  All questions have been answered.  - Flonase and allergy pill  - start Reflux gourmet and continue reflux medication - schedule a swallow study (MBS/esophagram) - schedule CT sinuses  - schedule lab work (ESR CRP) - RTC 2 months  Update 04/25/23: She is doing better from the standpoint of sore throat and pain with swallowing. MBS showed mild pharyngeal dysphagia. Audiogram showed asymmetric SNHL worse on the left. CT Sinuses showed mild thickening of mucosal lining of the maxillary sinuses, ITH but sinus drainage pathways were patent ( I personally  reviewed the imaging). She had normal ESR CRP and continues to have intermittent headache mostly around the right temple area   1.Odynophagia and throat discomfort  - resolved after initiation of GERD management with alginates  - continue Protonix 20 mg daily -Diet and lifestyle changes to minimize reflux -Swallow study reviewed and no additional pathology was identified aside from what summarized below Summary of MBS Clinical Impression: Patient presents with a mild pharyngeal phase dyspahgia as per this Modified Barium Swallow study. In addition, prominent cricopharyngeal bar observed but only mildly slowed barium transit through upper esophagus. Patient with only partial laryngeal elevation and partial anterior hyoid excursion. Swallow initiated at level of posterior surface of epiglottis as well as pyriform sinus with thin and nectar thick liquids. No penetration, aspiration and no pharyngeal residuals observed s/p initial swallows. No retrograde movement of barium in upper esophagus or at level of PES. SLP educated patient on importance of GERD management and she already reported she avoids spicy and acidic foods. Summary of Esophagram IMPRESSION: 1. Esophagus  appears normal without mucosal lesions or strictures. Normal motility 2. Spontaneous and induced gastroesophageal reflux.  2.  Evidence of asymmetric sensorineural hearing loss worse on the left - MRI IAC to rule out a lesion on the hearing nerve    3. Right temple area pain/headache  - ESR/CRP were negative, and temporal arteritis is very unlikely with normal inflammatory markers -I discussed with the patient that her symptoms could be related to headache or migraine -she cannot take Motrin due to history of glaucoma per report -I advised to consider discussing with her PCP and if needed she can be referred to neurology for evaluation and management of chronic headache  4.  Chronic nasal congestion symptoms of nasal obstruction and  chronic postnasal drainage - continue Zyrtec 10 mg daily and Flonase 2 puffs bilateral nares twice daily - start nasal rinses  - CT sinuses with evidence of mild thickening along the maxillary sinuses, and some ethmoids, and septal deviation with inferior turban hypertrophy -Will continue medical management and consider reimaging in a few months to determine if she has persistent sinus inflammation  RTC after imaging   Ashok Croon, MD [facility] Phone: [phone] Fax: [phone]    04/25/2023, 8:49 AM

## 2023-05-05 ENCOUNTER — Ambulatory Visit (HOSPITAL_COMMUNITY)
Admission: RE | Admit: 2023-05-05 | Discharge: 2023-05-05 | Disposition: A | Discharge status: Home / Self Care | Payer: Medicare Other | Source: Ambulatory Visit | Attending: Otolaryngology | Admitting: Otolaryngology

## 2023-05-05 DIAGNOSIS — J329 Chronic sinusitis, unspecified: Secondary | ICD-10-CM | POA: Diagnosis not present

## 2023-05-05 DIAGNOSIS — I6789 Other cerebrovascular disease: Secondary | ICD-10-CM | POA: Diagnosis not present

## 2023-05-05 DIAGNOSIS — H918X3 Other specified hearing loss, bilateral: Secondary | ICD-10-CM | POA: Insufficient documentation

## 2023-05-05 DIAGNOSIS — I6782 Cerebral ischemia: Secondary | ICD-10-CM | POA: Diagnosis not present

## 2023-05-05 DIAGNOSIS — J3489 Other specified disorders of nose and nasal sinuses: Secondary | ICD-10-CM | POA: Diagnosis not present

## 2023-05-05 MED ORDER — GADOBUTROL 1 MMOL/ML IV SOLN
9.0000 mL | Freq: Once | INTRAVENOUS | Status: AC | PRN
  Administered 2023-05-05: 9 mL via INTRAVENOUS

## 2023-05-13 ENCOUNTER — Telehealth: Payer: Self-pay | Admitting: Internal Medicine

## 2023-05-13 DIAGNOSIS — E782 Mixed hyperlipidemia: Secondary | ICD-10-CM

## 2023-05-13 NOTE — Telephone Encounter (Signed)
Pt reports that she stopped taking Crestor and Started taking Repatha- first dose was today. Wondering about when she needs to get repeat lab work.  Informed her that I will send this information to her provider and call her with recommendations.

## 2023-05-13 NOTE — Telephone Encounter (Signed)
Pt c/o medication issue:  1. Name of Medication: Crestor  2. How are you currently taking this medication (dosage and times per day)?   3. Are you having a reaction (difficulty breathing--STAT)?   4. What is your medication issue? Patient wanted Dr Jacques Navy  to know that she switched from Crestor to Repatha

## 2023-05-14 NOTE — Addendum Note (Signed)
Addended by: Lindell Spar on: 05/14/2023 09:19 AM   Modules accepted: Orders

## 2023-05-14 NOTE — Telephone Encounter (Signed)
Parke Poisson, MD  Scheryl Marten, RN Cc: Bernita Buffy, RN Caller: Unspecified Burgess Estelle,  1:22 PM) December. Liver/lipids. GA

## 2023-05-14 NOTE — Telephone Encounter (Signed)
Labs ordered. Update sent to patient in MyChart

## 2023-05-20 ENCOUNTER — Inpatient Hospital Stay
Admission: RE | Admit: 2023-05-20 | Discharge: 2023-05-20 | Disposition: A | Discharge status: Home / Self Care | Payer: Medicare Other | Source: Ambulatory Visit | Attending: Primary Care

## 2023-05-20 DIAGNOSIS — E2839 Other primary ovarian failure: Secondary | ICD-10-CM

## 2023-05-20 DIAGNOSIS — M8588 Other specified disorders of bone density and structure, other site: Secondary | ICD-10-CM | POA: Diagnosis not present

## 2023-05-26 ENCOUNTER — Telehealth: Payer: Self-pay | Admitting: Primary Care

## 2023-05-26 NOTE — Telephone Encounter (Signed)
Patient called in and stated that she is still taking her calcium and Vitamin D. She stated that she isn't walking much due to her knee and hip pain.

## 2023-05-26 NOTE — Telephone Encounter (Signed)
Noted  

## 2023-05-27 ENCOUNTER — Other Ambulatory Visit: Payer: Self-pay | Admitting: Family

## 2023-05-27 ENCOUNTER — Other Ambulatory Visit: Payer: Self-pay | Admitting: Primary Care

## 2023-05-27 DIAGNOSIS — K2100 Gastro-esophageal reflux disease with esophagitis, without bleeding: Secondary | ICD-10-CM

## 2023-05-27 DIAGNOSIS — I10 Essential (primary) hypertension: Secondary | ICD-10-CM

## 2023-06-23 ENCOUNTER — Encounter (INDEPENDENT_AMBULATORY_CARE_PROVIDER_SITE_OTHER): Payer: Self-pay | Admitting: Otolaryngology

## 2023-06-23 ENCOUNTER — Ambulatory Visit (INDEPENDENT_AMBULATORY_CARE_PROVIDER_SITE_OTHER): Payer: Medicare Other | Admitting: Otolaryngology

## 2023-06-23 VITALS — BP 126/80 | HR 66

## 2023-06-23 DIAGNOSIS — J3489 Other specified disorders of nose and nasal sinuses: Secondary | ICD-10-CM

## 2023-06-23 DIAGNOSIS — H905 Unspecified sensorineural hearing loss: Secondary | ICD-10-CM

## 2023-06-23 DIAGNOSIS — H9201 Otalgia, right ear: Secondary | ICD-10-CM

## 2023-06-23 DIAGNOSIS — H9319 Tinnitus, unspecified ear: Secondary | ICD-10-CM | POA: Diagnosis not present

## 2023-06-23 DIAGNOSIS — H9311 Tinnitus, right ear: Secondary | ICD-10-CM

## 2023-06-23 DIAGNOSIS — H6990 Unspecified Eustachian tube disorder, unspecified ear: Secondary | ICD-10-CM

## 2023-06-23 DIAGNOSIS — R0982 Postnasal drip: Secondary | ICD-10-CM | POA: Diagnosis not present

## 2023-06-23 DIAGNOSIS — J343 Hypertrophy of nasal turbinates: Secondary | ICD-10-CM | POA: Diagnosis not present

## 2023-06-23 DIAGNOSIS — M26621 Arthralgia of right temporomandibular joint: Secondary | ICD-10-CM

## 2023-06-23 DIAGNOSIS — K219 Gastro-esophageal reflux disease without esophagitis: Secondary | ICD-10-CM

## 2023-06-23 DIAGNOSIS — R0981 Nasal congestion: Secondary | ICD-10-CM

## 2023-06-23 DIAGNOSIS — M26629 Arthralgia of temporomandibular joint, unspecified side: Secondary | ICD-10-CM | POA: Diagnosis not present

## 2023-06-23 DIAGNOSIS — H918X3 Other specified hearing loss, bilateral: Secondary | ICD-10-CM

## 2023-06-23 DIAGNOSIS — R09A2 Foreign body sensation, throat: Secondary | ICD-10-CM

## 2023-06-23 MED ORDER — FLUTICASONE PROPIONATE 50 MCG/ACT NA SUSP: 2.0000 | Freq: Every day | NASAL | 6 refills | Status: AC

## 2023-06-23 MED ORDER — SALINE SPRAY 0.65 % NA SOLN: 1.0000 | NASAL | 5 refills | Status: AC | PRN

## 2023-06-23 MED ORDER — CETIRIZINE HCL 10 MG PO TABS: 10.0000 mg | ORAL_TABLET | Freq: Every day | ORAL | 11 refills | Status: AC

## 2023-06-23 NOTE — Progress Notes (Unsigned)
ENT Progress Note:  Update 06/23/23 Discussed the use of AI scribe software for clinical note transcription with the patient, who gave verbal consent to proceed.  History of Present Illness   The patient, with a history of asymmetric hearing loss, and nasal congestion/post-nasal drip, here to discuss sx and MRI IAC. She reports an improvement in symptoms. They have been using Navage, which they find helpful. They deny any ear discomfort but report a creaky or cricket-like noise in the right ear, particularly when turning their head. The audiology report indicates more pronounced hearing loss on the left side.  The patient also reports no issues with swallowing, except when they forget to take their reflux medication before eating. They are maintaining a regular diet. They have been using Zyrtec and Flonase for allergies, and they may need refills. Throat discomfort resolved.  The patient also uses a nasal rinse system, Navage.     Update 04/25/23 She returns for follow-up after hearing evaluation, swallow study and CT of the sinuses.  She reports that her symptoms are somewhat better, enjoying reflux Gourmet chocolate mint flavor, sore throat improved, but she continues to have right ear discomfort.   Initial Evaluation 02/24/23  Reason for Consult: right ear/temple and occiput discomfort x 1 year, throat discomfort   HPI: Marie Ruiz is an 77 y.o. female with hx of GERD, recently started Pantoprazole x 1 mo, improved after she started it, epigastric pain, was previously on carafate, and hx of DVT left leg 2018, previously on Xarelto, hx of left lower extremity mass, MRI report pending, here for multiple complaints  Right ear discomfort - sensation of dull achy feeling in right ear, no hearing changes, denies frequent ear popping. She has intermittent tinnitus. Denies bruxism. Denies hx migraines, but she does have tension headaches sometimes, typically on the right side. She has hx of  glaucoma. She sees Ophtho 6 months. No ear surgeries. No ear drainage.   Throat discomfort x few months. She has choking at times, but not frequently. She rates it at 4/10, usually twice a week, and feels as dull pain. She is tolerating a regular diet.   Records Reviewed:  PCP note - hx of DVT LLE dx 2020 stopped Xarelto    Past Medical History:  Diagnosis Date   Acute diarrhea 01/28/2023   Allergy    seasonal   Anxiety    Aortic aneurysm (HCC) 08/2018   Arthritis    Blood clotting disorder (HCC)    Bone spur    with excision   Chickenpox    Colon polyps    DVT (deep venous thrombosis) (HCC)    Glaucoma    both   Graves disease 03/30/2013   History of blood clots 10/2017   Hyperlipemia    Hypertension    Osteopenia    Prediabetes    Restless leg syndrome     Past Surgical History:  Procedure Laterality Date   BREAST CYST ASPIRATION     CATARACT EXTRACTION W/ INTRAOCULAR LENS IMPLANT Right 2015   CATARACT EXTRACTION W/PHACO Left 2017   COLONOSCOPY  2014   DILATION AND CURETTAGE OF UTERUS      Family History  Problem Relation Age of Onset   Heart disease Mother    Heart attack Father    Breast cancer Sister 59   Colon polyps Sister    Aneurysm Brother    Multiple myeloma Sister        found in stage 4   Breast cancer  Maternal Grandmother    Esophageal cancer Maternal Aunt 82   Colon cancer Neg Hx    Rectal cancer Neg Hx    Stomach cancer Neg Hx     Social History:  reports that she quit smoking about 34 years ago. Her smoking use included cigarettes. She has never used smokeless tobacco. She reports current alcohol use of about 2.0 standard drinks of alcohol per week. She reports that she does not currently use drugs.  Allergies:  Allergies  Allergen Reactions   Amitriptyline Other (See Comments)    hallucinations   Codeine    Crestor [Rosuvastatin]     Myalgias    Lipitor [Atorvastatin]     Myaglias    Prednisone     Depression     Medications: I have reviewed the patient's current medications.  The PMH, PSH, Medications, Allergies, and SH were reviewed and updated.  ROS: Constitutional: Negative for fever, weight loss and weight gain. Cardiovascular: Negative for chest pain and dyspnea on exertion. Respiratory: Is not experiencing shortness of breath at rest. Gastrointestinal: Negative for nausea and vomiting. Neurological: Negative for headaches. Psychiatric: The patient is not nervous/anxious  Blood pressure 126/80, pulse 66, SpO2 96%.  PHYSICAL EXAM:  Exam: General: Well-developed, well-nourished Respiratory Respiratory effort: Equal inspiration and expiration without stridor Cardiovascular Peripheral Vascular: Warm extremities with equal color/perfusion Eyes: No nystagmus with equal extraocular motion bilaterally Neuro/Psych/Balance: Patient oriented to person, place, and time; Appropriate mood and affect; Gait is intact with no imbalance; Cranial nerves I-XII are intact Head and Face Inspection: Normocephalic and atraumatic without mass or lesion Facial Strength: Facial motility symmetric and full bilaterally ENT Pinna: External ear intact and fully developed External canal: Canal is patent with intact skin External Nose: No scar or anatomic deformity TMJ: R TMJ with pain to palpation with full mobility Neck Neck and Trachea: Midline trachea without mass or lesion   Studies Reviewed: 02/04/23 LLE venous U/S EXAM: LEFT LOWER EXTREMITY VENOUS DOPPLER ULTRASOUND   TECHNIQUE: Gray-scale sonography with graded compression, as well as color Doppler and duplex ultrasound were performed to evaluate the lower extremity deep venous systems from the level of the common femoral vein and including the common femoral, femoral, profunda femoral, popliteal and calf veins including the posterior tibial, peroneal and gastrocnemius veins when visible. The superficial great saphenous vein was also  interrogated. Spectral Doppler was utilized to evaluate flow at rest and with distal augmentation maneuvers in the common femoral, femoral and popliteal veins.   COMPARISON:  None Available.   FINDINGS: Contralateral Common Femoral Vein: Respiratory phasicity is normal and symmetric with the symptomatic side. No evidence of thrombus. Normal compressibility.   Common Femoral Vein: No evidence of thrombus. Normal compressibility, respiratory phasicity and response to augmentation.   Saphenofemoral Junction: No evidence of thrombus. Normal compressibility and flow on color Doppler imaging.   Profunda Femoral Vein: No evidence of thrombus. Normal compressibility and flow on color Doppler imaging.   Femoral Vein: No evidence of thrombus. Normal compressibility, respiratory phasicity and response to augmentation.   Popliteal Vein: No evidence of thrombus. Normal compressibility, respiratory phasicity and response to augmentation.   Calf Veins: No evidence of thrombus. Normal compressibility and flow on color Doppler imaging.   Superficial Great Saphenous Vein: No evidence of thrombus. Normal compressibility.   Other Findings: Sonographic evaluation of patient's area of discomfort involving the posterior aspect of the distal thigh demonstrates a 1.2 x 0.9 x 0.9 cm anechoic apparent fluid collection about the posterior distal  aspect of the femur, reportedly not in communication with the popliteal space to suggest the presence of a Baker's cyst.   Otherwise, there is no sonographic correlate for patient's area of discomfort involving the posterior aspect of the distal left thigh. Specifically, no regional superficial thrombophlebitis.   IMPRESSION: 1. No evidence of acute or chronic DVT within the left lower extremity. 2. Sonographic evaluation of patient's area of discomfort involving the posterior aspect of the distal left thigh demonstrates a 1.2 cm anechoic apparent fluid  collection about the posterior distal aspect of the femur, reportedly not in communication with the popliteal space to suggest this fluid collection represents a Baker's cyst. While nonspecific, this structure could represent an inflamed bursal sac, hematoma or seroma. Clinical correlation is advised. Further evaluation of the knee MRI could be performed as indicated  Audiology 03/12/23 Asymmetric sensorineural hearing loss, left ear that's worse despite her pain being in the right ear. 100% WDS. Mild sloping to moderate SNHL. Type A tymps AU.   CT sinuses 03/10/23 FINDINGS: Paranasal sinuses:   Frontal: Normally aerated. Patent frontal sinus drainage pathways.   Ethmoid: Mild scattered mucosal thickening bilaterally. Secretions in an anterior right ethmoid air cell.   Maxillary: Moderate mucosal thickening bilaterally.   Sphenoid: Small amount of secretions in the lateral recess of the left sphenoid sinus. Patent sphenoethmoidal recesses.   Right ostiomeatal unit: Opacified infundibulum. Patent accessory maxillary sinus ostium.   Left ostiomeatal unit: Opacified infundibulum. Patent accessory maxillary sinus ostium.   Nasal passages: Patent. 2 mm rightward deviation of an intact nasal septum.   Anatomy: No pneumatization superior to anterior ethmoid notches. Keros II. Sellar sphenoid pneumatization pattern. No dehiscence of carotid or optic canals. No onodi cell.   Other: Clear mastoid air cells and middle ear cavities. Bilateral cataract extraction. Moderately advanced disc degeneration at C3-4 and C5-6. No acute finding in the included portion of the brain.   IMPRESSION: Inflammatory sinus disease as detailed above, greatest in the maxillary sinuses.  Studies I personally reviewed include MBS/esophagram study and CT max/face study  MRI IAC 05/05/23 IMPRESSION: 1. No cerebellopontine angle or internal auditory canal mass. 2. No etiology of hearing loss is  identified. 3. Chronic small vessel ischemic changes within the cerebral white matter, mild for age. 4. Paranasal sinus disease as described.  MRI IAC negative for CPA lesions done 05/05/23 IMPRESSION: 1. No cerebellopontine angle or internal auditory canal mass. 2. No etiology of hearing loss is identified. 3. Chronic small vessel ischemic changes within the cerebral white matter, mild for age. 4. Paranasal sinus disease as described.  Assessment/Plan: Encounter Diagnoses  Name Primary?   Right ear pain Yes   Tinnitus of right ear    Arthralgia of right temporomandibular joint    Gastroesophageal reflux disease without esophagitis    Globus pharyngeus    Asymmetrical hearing loss       77 year old female with a recent diagnosis of GERD LPR recently started on pantoprazole, reports symptoms of chest discomfort and epigastric discomfort for which she was also given Carafate currently does not take it anymore, history of tension type headaches that typically presents with right sided headache, history of glaucoma followed by ophthalmology, who is here for chronic right ear discomfort pain in the right temple area both are intermittent in nature and come and go described as dull achy pain.  She denies acute changes in her vision with the symptoms.  She also denies history of recurrent ear infections or ear drainage or  ear surgeries.  Right-sided tinnitus but no significant ringing on the other side.  She is also here for chronic throat discomfort, present only sometimes, and intermittent choking on foods or liquids which only happens once or twice a week.  Tolerating regular diet.  No prior swallow studies.  She has history of nasal congestion and thinks her headaches are related to chronic nasal congestion and sinus inflammation.  Started over-the-counter Flonase and allergy medication, and feels that it helps her symptoms.  No prior imaging of the sinuses.   Her exam is significant for  mild nasal congestion in the nasal passages, mucosal edema, but no polyps or purulence, there is no evidence of masses or lesions on flexible laryngoscopy.  She did have moderate postcricoid edema and pachydermia consistent with GERD LPR but no pooling of secretions in piriform sinuses.  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  We also discussed the possibility of temporal arteritis although she is not endorsing any vision changes and it is very unlikely that she has it.  Will obtain screening labs with ESR and CRP.  If positive will have her see her ophthalmology physician and consider further workup.  Another option is that her headaches are related to chronic sinus inflammation, we will obtain CT of the sinuses to rule out.  Her throat symptoms are most likely due to undertreated reflux, and we will add alginate's to her current regimen with PPI.  Cannot rule out dysphagia based on her symptom description, will order MBS and esophagram to further evaluate.  She will have audiogram to rule out hearing loss due to evidence of no acute ear process and history of right-sided tinnitus.  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  All questions have been answered.  - Flonase and allergy pill  - start Reflux gourmet and continue reflux medication - schedule a swallow study (MBS/esophagram) - schedule CT sinuses  - schedule lab work (ESR CRP) - RTC 2 months  Update 04/25/23: She is doing better from the standpoint of sore throat and pain with swallowing. MBS showed mild pharyngeal dysphagia. Audiogram showed asymmetric SNHL worse on the left. CT Sinuses showed mild thickening of mucosal lining of the maxillary sinuses, ITH but sinus drainage pathways were patent ( I personally reviewed the imaging). She had normal ESR CRP and  continues to have intermittent headache mostly around the right temple area   1.Odynophagia and throat discomfort  - resolved after initiation of GERD management with alginates  - continue Protonix 20 mg daily -Diet and lifestyle changes to minimize reflux -Swallow study reviewed and no additional pathology was identified aside from what summarized below Summary of MBS Clinical Impression: Patient presents with a mild pharyngeal phase dyspahgia as per this Modified Barium Swallow study. In addition, prominent cricopharyngeal bar observed but only mildly slowed barium transit through upper esophagus. Patient with only partial laryngeal elevation and partial anterior hyoid excursion. Swallow initiated at level of posterior surface of epiglottis as well as pyriform sinus with thin and nectar thick liquids. No penetration, aspiration and no pharyngeal residuals observed s/p initial swallows. No retrograde movement of barium in upper esophagus or at level of PES. SLP educated patient on importance of GERD management and she already reported she avoids spicy and acidic foods. Summary of Esophagram IMPRESSION: 1. Esophagus  appears normal without mucosal lesions or strictures. Normal motility 2. Spontaneous and induced gastroesophageal reflux.  2.  Evidence of asymmetric sensorineural hearing loss worse on the left - MRI IAC to rule out a lesion on the hearing nerve    3. Right temple area pain/headache  - ESR/CRP were negative, and temporal arteritis is very unlikely with normal inflammatory markers -I discussed with the patient that her symptoms could be related to headache or migraine -she cannot take Motrin due to history of glaucoma per report -I advised to consider discussing with her PCP and if needed she can be referred to neurology for evaluation and management of chronic headache  4.  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 No dizziness or balance problems. No ear fullness, unremarkable ear exam. Reports intermittent tinnitus likely related to hx of hearing loss, more pronounced when turning the head and described as crackling noise (could also be related to eustachian tube dysfunction). Left ear shows worse hearing on audiology report. Symptoms improved with current treatment. Discussed the importance of regular annual hearing tests. Not candidate for hearing aids at this time.  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

## 2023-07-02 DIAGNOSIS — E782 Mixed hyperlipidemia: Secondary | ICD-10-CM | POA: Diagnosis not present

## 2023-07-03 LAB — HEPATIC FUNCTION PANEL
ALT: 14 [IU]/L (ref 0–32)
AST: 19 [IU]/L (ref 0–40)
Albumin: 4.6 g/dL (ref 3.8–4.8)
Alkaline Phosphatase: 73 [IU]/L (ref 44–121)
Bilirubin Total: 0.5 mg/dL (ref 0.0–1.2)
Bilirubin, Direct: 0.18 mg/dL (ref 0.00–0.40)
Total Protein: 7.4 g/dL (ref 6.0–8.5)

## 2023-07-03 LAB — LIPID PANEL
Chol/HDL Ratio: 2.7 {ratio} (ref 0.0–4.4)
Cholesterol, Total: 194 mg/dL (ref 100–199)
HDL: 73 mg/dL (ref >39)
LDL Chol Calc (NIH): 100 mg/dL — ABNORMAL HIGH (ref 0–99)
Triglycerides: 119 mg/dL (ref 0–149)
VLDL Cholesterol Cal: 21 mg/dL (ref 5–40)

## 2023-08-29 ENCOUNTER — Other Ambulatory Visit: Payer: Self-pay | Admitting: Family

## 2023-08-29 DIAGNOSIS — K2100 Gastro-esophageal reflux disease with esophagitis, without bleeding: Secondary | ICD-10-CM

## 2023-09-01 DIAGNOSIS — D492 Neoplasm of unspecified behavior of bone, soft tissue, and skin: Secondary | ICD-10-CM | POA: Diagnosis not present

## 2023-09-01 DIAGNOSIS — L814 Other melanin hyperpigmentation: Secondary | ICD-10-CM | POA: Diagnosis not present

## 2023-09-01 DIAGNOSIS — L538 Other specified erythematous conditions: Secondary | ICD-10-CM | POA: Diagnosis not present

## 2023-09-01 DIAGNOSIS — L820 Inflamed seborrheic keratosis: Secondary | ICD-10-CM | POA: Diagnosis not present

## 2023-09-01 DIAGNOSIS — D2272 Melanocytic nevi of left lower limb, including hip: Secondary | ICD-10-CM | POA: Diagnosis not present

## 2023-09-04 ENCOUNTER — Encounter: Payer: Self-pay | Admitting: Primary Care

## 2023-09-04 ENCOUNTER — Ambulatory Visit (INDEPENDENT_AMBULATORY_CARE_PROVIDER_SITE_OTHER): Payer: Medicare Other | Admitting: Primary Care

## 2023-09-04 ENCOUNTER — Encounter: Payer: Self-pay | Admitting: *Deleted

## 2023-09-04 VITALS — BP 126/78 | HR 60 | Temp 98.6°F | Ht 65.0 in | Wt 196.4 lb

## 2023-09-04 DIAGNOSIS — R1030 Lower abdominal pain, unspecified: Secondary | ICD-10-CM

## 2023-09-04 DIAGNOSIS — R350 Frequency of micturition: Secondary | ICD-10-CM | POA: Diagnosis not present

## 2023-09-04 DIAGNOSIS — I700 Atherosclerosis of aorta: Secondary | ICD-10-CM

## 2023-09-04 DIAGNOSIS — R102 Pelvic and perineal pain: Secondary | ICD-10-CM

## 2023-09-04 DIAGNOSIS — I7121 Aneurysm of the ascending aorta, without rupture: Secondary | ICD-10-CM

## 2023-09-04 DIAGNOSIS — G8929 Other chronic pain: Secondary | ICD-10-CM | POA: Diagnosis not present

## 2023-09-04 DIAGNOSIS — E782 Mixed hyperlipidemia: Secondary | ICD-10-CM

## 2023-09-04 LAB — POC URINALSYSI DIPSTICK (AUTOMATED)
Bilirubin, UA: NEGATIVE
Blood, UA: 10
Glucose, UA: NEGATIVE
Ketones, UA: NEGATIVE
Nitrite, UA: NEGATIVE
Protein, UA: NEGATIVE
Spec Grav, UA: <=1.005 — AB (ref 1.010–1.025)
Urobilinogen, UA: 0.2 U/dL
pH, UA: 7.5 (ref 5.0–8.0)

## 2023-09-04 NOTE — Progress Notes (Signed)
Subjective:    Patient ID: Marie Ruiz, female    DOB: 1945-09-18, 78 y.o.   MRN: [id number]  Groin Pain The patient's primary symptoms include pelvic pain. The patient's pertinent negatives include no vaginal discharge. Associated symptoms include frequency. Pertinent negatives include no abdominal pain, dysuria or nausea.  Abdominal Pain Associated symptoms include arthralgias and frequency. Pertinent negatives include no dysuria or nausea.  Urinary Frequency  Associated symptoms include frequency. Pertinent negatives include no nausea.    Marie Ruiz is a very pleasant 78 y.o. female with a history of hypertension, ascending aortic aneurysm, Graves' disease, osteoarthritis, osteopenia, recurrent UTI, hyperlipidemia, chronic hip pain who presents today to discuss chronic pelvic pain.  She is also needing a repeat lipid panel for hyperlipidemia.  Chronic since December 2022 with pain to the bilateral lower pelvis/groin and suprapubic pelvis. Also with internal vaginal pain/dryness for which she's been using Replens, this helps with dryness.   Her pain is intermittent to the bilateral lower pelvis/groin, mostly noticeable when walking. She underwent bilateral hip xrays in August 2023 which were negative for arthritis.  She's noticed increased suprapubic pressure over the last three days. Increased urinary frequency. She denies hematuria, foul smelling urine, vaginal discharge.   BP Readings from Last 3 Encounters:  09/04/23 126/78  06/23/23 126/80  04/25/23 133/85        Review of Systems  Gastrointestinal:  Negative for abdominal pain and nausea.  Genitourinary:  Positive for frequency and pelvic pain. Negative for dysuria and vaginal discharge.       Chronic bilateral groin pain  Musculoskeletal:  Positive for arthralgias.         Past Medical History:  Diagnosis Date   Acute diarrhea 01/28/2023   Allergy    seasonal   Anxiety    Aortic aneurysm  (HCC) 08/2018   Arthritis    Blood clotting disorder (HCC)    Bone spur    with excision   Chickenpox    Colon polyps    DVT (deep venous thrombosis) (HCC)    Glaucoma    both   Graves disease 03/30/2013   History of blood clots 10/2017   Hyperlipemia    Hypertension    Osteopenia    Prediabetes    Restless leg syndrome     Social History   Socioeconomic History   Marital status: Married    Spouse name: Not on file   Number of children: Not on file   Years of education: Not on file   Highest education level: Not on file  Occupational History   Occupation: Retired  Tobacco Use   Smoking status: Former    Current packs/day: 0.00    Types: Cigarettes    Quit date: 1990    Years since quitting: 35.1   Smokeless tobacco: Never  Vaping Use   Vaping status: Never Used  Substance and Sexual Activity   Alcohol use: Yes    Alcohol/week: 2.0 standard drinks of alcohol    Types: 2 Standard drinks or equivalent per week   Drug use: Not Currently   Sexual activity: Not Currently  Other Topics Concern   Not on file  Social History Narrative   Lives in [place], moving to [place].   Retired.   Married.    Social Drivers of Corporate investment banker Strain: Low Risk  (10/31/2022)   Overall Financial Resource Strain (CARDIA)    Difficulty of Paying Living Expenses: Not hard at all  Food Insecurity:  No Food Insecurity (10/31/2022)   Hunger Vital Sign    Worried About Running Out of Food in the Last Year: Never true    Ran Out of Food in the Last Year: Never true  Transportation Needs: No Transportation Needs (10/31/2022)   PRAPARE - Administrator, Civil Service (Medical): No    Lack of Transportation (Non-Medical): No  Physical Activity: Inactive (10/31/2022)   Exercise Vital Sign    Days of Exercise per Week: 0 days    Minutes of Exercise per Session: 0 min  Stress: No Stress Concern Present (10/31/2022)   [employer] of Occupational Health - Occupational  Stress Questionnaire    Feeling of Stress : Not at all  Social Connections: Socially Integrated (10/31/2022)   Social Connection and Isolation Panel [NHANES]    Frequency of Communication with Friends and Family: More than three times a week    Frequency of Social Gatherings with Friends and Family: More than three times a week    Attends Religious Services: More than 4 times per year    Active Member of [employer] or Organizations: Yes    Attends Engineer, structural: More than 4 times per year    Marital Status: Married  Catering manager Violence: Not At Risk (10/31/2022)   Humiliation, Afraid, Rape, and Kick questionnaire    Fear of Current or Ex-Partner: No    Emotionally Abused: No    Physically Abused: No    Sexually Abused: No    Past Surgical History:  Procedure Laterality Date   BREAST CYST ASPIRATION     CATARACT EXTRACTION W/ INTRAOCULAR LENS IMPLANT Right 2015   CATARACT EXTRACTION W/PHACO Left 2017   COLONOSCOPY  2014   DILATION AND CURETTAGE OF UTERUS      Family History  Problem Relation Age of Onset   Heart disease Mother    Heart attack Father    Breast cancer Sister 48   Colon polyps Sister    Aneurysm Brother    Multiple myeloma Sister        found in stage 4   Breast cancer Maternal Grandmother    Esophageal cancer Maternal Aunt 82   Colon cancer Neg Hx    Rectal cancer Neg Hx    Stomach cancer Neg Hx     Allergies  Allergen Reactions   Amitriptyline Other (See Comments)    hallucinations   Codeine    Crestor [Rosuvastatin]     Myalgias    Lipitor [Atorvastatin]     Myaglias    Prednisone     Depression    Current Outpatient Medications on File Prior to Visit  Medication Sig Dispense Refill   acetaminophen (TYLENOL) 500 MG tablet Take 500 mg by mouth as needed for mild pain.     amLODipine-benazepril (LOTREL) 5-20 MG capsule TAKE 1 CAPSULE BY MOUTH DAILY. FOR BLOOD PRESSURE. 90 capsule 2   b complex vitamins capsule Take 1 capsule by  mouth daily.     brimonidine (ALPHAGAN) 0.2 % ophthalmic solution 1 drop 2 (two) times daily.     Calcium Carbonate-Vit D-Min (CALCIUM 1200 PO) Take by mouth.     cetirizine (ZYRTEC) 10 MG tablet Take 1 tablet (10 mg total) by mouth daily. 30 tablet 11   cholecalciferol (VITAMIN D) 1000 units tablet Take 1,000 Units by mouth daily.     cyanocobalamin (VITAMIN B12) 1000 MCG tablet Take 1,000 mcg by mouth daily.     dorzolamide-timolol (COSOPT) 22.3-6.8 MG/ML ophthalmic  solution Place 1 drop into both eyes 2 (two) times daily.     Evolocumab (REPATHA SURECLICK) 140 MG/ML SOAJ Inject 140 mg into the skin every 14 (fourteen) days. 6 mL 3   FLUoxetine (PROZAC) 20 MG capsule TAKE 1 CAPSULE (20 MG TOTAL) BY MOUTH DAILY. FOR ANXIETY 90 capsule 2   fluticasone (FLONASE) 50 MCG/ACT nasal spray Place 2 sprays into both nostrils daily. 16 g 6   MAGNESIUM PO Take by mouth.     Multiple Vitamin (MULTIVITAMIN) tablet Take 1 tablet by mouth daily.     OVER THE COUNTER MEDICATION Blue - Emu     OVER THE COUNTER MEDICATION Bio true     pantoprazole (PROTONIX) 20 MG tablet TAKE 1 TABLET BY MOUTH 2 TIMES DAILY BEFORE A MEAL FOR HEARTBURN. 180 tablet 2   sodium chloride (OCEAN) 0.65 % SOLN nasal spray Place 1 spray into both nostrils as needed. 30 mL 5   traZODone (DESYREL) 50 MG tablet Take 1 tablet (50 mg total) by mouth at bedtime as needed for sleep. 90 tablet 1   trolamine salicylate (ASPERCREME) 10 % cream Apply 1 application  topically as needed for muscle pain.     sucralfate (CARAFATE) 1 g tablet Take 1 tablet (1 g total) by mouth 4 (four) times daily -  with meals and at bedtime for 10 days. 40 tablet 0   No current facility-administered medications on file prior to visit.    BP 126/78 (BP Location: Left Arm, Patient Position: Sitting, Cuff Size: Normal)   Pulse 60   Temp 98.6 F (37 C) (Oral)   Ht 5\' 5"  (1.651 m)   Wt 196 lb 6 oz (89.1 kg)   SpO2 98%   BMI 32.68 kg/m  Objective:   Physical  Exam Constitutional:      Appearance: She is not ill-appearing.  HENT:     Nose: No mucosal edema.     Right Sinus: No maxillary sinus tenderness or frontal sinus tenderness.     Left Sinus: No maxillary sinus tenderness or frontal sinus tenderness.  Cardiovascular:     Rate and Rhythm: Normal rate.  Pulmonary:     Effort: Pulmonary effort is normal.  Musculoskeletal:     Right hip: Normal range of motion.     Left hip: Normal range of motion.     Right lower leg: No edema.     Left lower leg: No edema.       Legs:     Comments: Bilateral groin pain as noted, no tenderness.  Skin:    General: Skin is warm and dry.           Assessment & Plan:  Chronic pelvic pain in female Assessment & Plan: Unclear etiology.  Could be vascular versus gynecological.  Transvaginal/pelvic ultrasound ordered and pending. Duplex ultrasound ordered and pending lower extremities to evaluate femoral artery blood flow.  Continue Replens for vaginal atrophy.  Urinalysis today with 2+ leuks, trace blood. Urine culture ordered and pending.  Orders: -     US PELVIC COMPLETE WITH TRANSVAGINAL; Future -     POCT Urinalysis Dipstick (Automated)  Urinary frequency -     POCT Urinalysis Dipstick (Automated) -     Urine Culture  Chronic groin pain, unspecified laterality Assessment & Plan: Differentials include ovarian cause, other pelvic cause, abnormality within the femoral arteries.  Given her history of aortic atherosclerosis, hyperlipidemia, aneurysm, we will obtain ultrasounds of the lower extremities focusing on the femoral arteries.  Transvaginal/pelvic ultrasound ordered and pending.    Orders: -     VAS [place] LOWER EXTREMITY ARTERIAL DUPLEX; Future  Aortic atherosclerosis (HCC) -     VAS [place] LOWER EXTREMITY ARTERIAL DUPLEX; Future -     Lipid panel  Aneurysm of ascending aorta without rupture (HCC) -     VAS [place] LOWER EXTREMITY ARTERIAL DUPLEX; Future  Mixed  hyperlipidemia Assessment & Plan: Repeat lipid panel pending.  Continue Repatha 120 mg every 2 weeks.  Orders: -     Lipid panel        Doreene Nest, NP

## 2023-09-04 NOTE — Assessment & Plan Note (Signed)
Repeat lipid panel pending.  Continue Repatha 120 mg every 2 weeks.

## 2023-09-04 NOTE — Assessment & Plan Note (Addendum)
Unclear etiology.  Could be vascular versus gynecological.  Transvaginal/pelvic ultrasound ordered and pending. Duplex ultrasound ordered and pending lower extremities to evaluate femoral artery blood flow.  Continue Replens for vaginal atrophy.  Urinalysis today with 2+ leuks, trace blood. Urine culture ordered and pending.

## 2023-09-04 NOTE — Assessment & Plan Note (Signed)
Differentials include ovarian cause, other pelvic cause, abnormality within the femoral arteries.  Given her history of aortic atherosclerosis, hyperlipidemia, aneurysm, we will obtain ultrasounds of the lower extremities focusing on the femoral arteries. Transvaginal/pelvic ultrasound ordered and pending.

## 2023-09-04 NOTE — Patient Instructions (Signed)
You will receive a phone call regarding the pelvic/transvaginal ultrasound.  You will also receive a phone call regarding the blood flow studies.  We will be in touch regarding the urine sample result.  Stop by the lab prior to leaving today. I will notify you of your results once received.   It was a pleasure to see you today!

## 2023-09-05 LAB — URINE CULTURE
MICRO NUMBER:: 16080890
Result:: NO GROWTH
SPECIMEN QUALITY:: ADEQUATE

## 2023-09-05 LAB — LIPID PANEL
Cholesterol: 206 mg/dL — ABNORMAL HIGH (ref 0–200)
HDL: 71.5 mg/dL (ref >39.00)
LDL Cholesterol: 102 mg/dL — ABNORMAL HIGH (ref 0–99)
NonHDL: 134.43
Total CHOL/HDL Ratio: 3
Triglycerides: 162 mg/dL — ABNORMAL HIGH (ref 0.0–149.0)
VLDL: 32.4 mg/dL (ref 0.0–40.0)

## 2023-09-09 ENCOUNTER — Ambulatory Visit
Admission: RE | Admit: 2023-09-09 | Discharge: 2023-09-09 | Disposition: A | Discharge status: Home / Self Care | Payer: Medicare Other | Source: Ambulatory Visit | Attending: Primary Care | Admitting: Primary Care

## 2023-09-09 DIAGNOSIS — G8929 Other chronic pain: Secondary | ICD-10-CM

## 2023-09-09 DIAGNOSIS — R9389 Abnormal findings on diagnostic imaging of other specified body structures: Secondary | ICD-10-CM | POA: Diagnosis not present

## 2023-09-12 ENCOUNTER — Other Ambulatory Visit: Payer: Self-pay | Admitting: Primary Care

## 2023-09-12 DIAGNOSIS — G8929 Other chronic pain: Secondary | ICD-10-CM

## 2023-09-15 ENCOUNTER — Ambulatory Visit (HOSPITAL_COMMUNITY)
Admission: RE | Admit: 2023-09-15 | Discharge: 2023-09-15 | Disposition: A | Discharge status: Home / Self Care | Payer: Medicare Other | Source: Ambulatory Visit | Attending: Primary Care | Admitting: Primary Care

## 2023-09-15 DIAGNOSIS — I7121 Aneurysm of the ascending aorta, without rupture: Secondary | ICD-10-CM | POA: Insufficient documentation

## 2023-09-15 DIAGNOSIS — I700 Atherosclerosis of aorta: Secondary | ICD-10-CM | POA: Insufficient documentation

## 2023-09-15 DIAGNOSIS — R1030 Lower abdominal pain, unspecified: Secondary | ICD-10-CM | POA: Diagnosis not present

## 2023-09-15 DIAGNOSIS — G8929 Other chronic pain: Secondary | ICD-10-CM | POA: Diagnosis not present

## 2023-09-30 ENCOUNTER — Ambulatory Visit (INDEPENDENT_AMBULATORY_CARE_PROVIDER_SITE_OTHER): Admitting: Internal Medicine

## 2023-09-30 ENCOUNTER — Ambulatory Visit: Payer: Self-pay | Admitting: Primary Care

## 2023-09-30 ENCOUNTER — Encounter: Payer: Self-pay | Admitting: Internal Medicine

## 2023-09-30 VITALS — BP 110/74 | HR 68 | Temp 98.2°F | Wt 194.0 lb

## 2023-09-30 DIAGNOSIS — W19XXXD Unspecified fall, subsequent encounter: Secondary | ICD-10-CM | POA: Diagnosis not present

## 2023-09-30 DIAGNOSIS — M549 Dorsalgia, unspecified: Secondary | ICD-10-CM
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
## 2023-09-30 NOTE — Telephone Encounter (Signed)
   Chief Complaint: fell backwards on Saturday- hit head Symptoms: Patient state sshe feel backwards on concrete Saturday. EMS was called and she was examined. Patient states she had large "goose egg" on back of head- still sore/tender to touch, slight swelling possible- but has decreased. Patient states she has a tingling sensation across the top of chest and back that has not resolved- wonders if undiagnosed injury Frequency: fell Saturday Pertinent Negatives: Patient denies confusion, vision change- any other symptoms Disposition: [] ED /[] Urgent Care (no appt availability in office) / [x] Appointment(In office/virtual)/ []  [facility]/ [] Home Care/ [] Refused Recommended Disposition /[]  Mobile Bus/ []  Follow-up with PCP Additional Notes: Appointment scheduled for evaluation    Copied from CRM [id number]. Topic: Clinical - Red Word Triage >> Sep 30, 2023  8:33 AM Elizebeth Brooking wrote: [facility] that prompted transfer to Nurse Triage: Fall, had a a big goose head put ice on it and went down, now she has a burning sensation on her chest Reason for Disposition  [1] Age over 64 years AND [2] swelling or bruise  Answer Assessment - Initial Assessment Questions 1. MECHANISM: "How did the injury happen?" For falls, ask: "What height did you fall from?" and "What surface did you fall against?"      Fell backward and hit head on concrete  2. ONSET: "When did the injury happen?" (Minutes or hours ago)      Patient fell Saturday 3. NEUROLOGIC SYMPTOMS: "Was there any loss of consciousness?" "Are there any other neurological symptoms?"      Stinging sensation across chest and back-still present 4. MENTAL STATUS: "Does the person know who they are, who you are, and where they are?"      alert 5. LOCATION: "What part of the head was hit?"      Back of the head 6. SCALP APPEARANCE: "What does the scalp look like? Is it bleeding now?" If Yes, ask: "Is it difficult to stop?"      swelling 7.  SIZE: For cuts, bruises, or swelling, ask: "How large is it?" (e.g., inches or centimeters)      Swelling has gone down- but is still sore- possible slight swelling 8. PAIN: "Is there any pain?" If Yes, ask: "How bad is it?"  (e.g., Scale 1-10; or mild, moderate, severe)     Still very tender  10. OTHER SYMPTOMS: "Do you have any other symptoms?" (e.g., neck pain, vomiting)       Burning sensation-runs across chest and back  Protocols used: Head Injury-A-AH

## 2023-10-01 ENCOUNTER — Other Ambulatory Visit

## 2023-10-01 ENCOUNTER — Ambulatory Visit

## 2023-10-01 DIAGNOSIS — M549 Dorsalgia, unspecified: Secondary | ICD-10-CM

## 2023-10-01 DIAGNOSIS — W19XXXD Unspecified fall, subsequent encounter: Secondary | ICD-10-CM | POA: Diagnosis not present

## 2023-10-01 DIAGNOSIS — R079 Chest pain, unspecified: Secondary | ICD-10-CM | POA: Diagnosis not present

## 2023-10-03 ENCOUNTER — Telehealth: Payer: Self-pay | Admitting: *Deleted

## 2023-10-03 NOTE — Telephone Encounter (Signed)
 Copied from CRM [id number]. Topic: Clinical - Lab/Test Results >> Oct 03, 2023  9:24 AM Elizebeth Brooking wrote: Reason for CRM: Patient call in wanting to know the status of the xrays that she had done on the 12th, is requesting for someone to give her a callback about this

## 2023-10-06 NOTE — Telephone Encounter (Signed)
 Patient is aware

## 2023-10-13 ENCOUNTER — Telehealth: Payer: Self-pay

## 2023-10-13 ENCOUNTER — Encounter: Payer: Self-pay | Admitting: *Deleted

## 2023-10-13 NOTE — Telephone Encounter (Signed)
 Copied from CRM [id number]. Topic: Clinical - Lab/Test Results >> Oct 13, 2023  9:43 AM Sim Boast F wrote: Reason for CRM: Patient request call back regarding an update on her results from her x-ray done 10/01/23 - says she never had to wait this long before

## 2023-10-13 NOTE — Telephone Encounter (Signed)
 Spoke to [place] and she will "put a note on it".

## 2023-10-13 NOTE — Telephone Encounter (Signed)
 Patient is aware of xray result.  See result note.

## 2023-10-18 ENCOUNTER — Other Ambulatory Visit: Payer: Self-pay | Admitting: Family

## 2023-10-18 ENCOUNTER — Other Ambulatory Visit: Payer: Self-pay | Admitting: Primary Care

## 2023-10-18 DIAGNOSIS — F411 Generalized anxiety disorder: Secondary | ICD-10-CM

## 2023-10-18 DIAGNOSIS — K2100 Gastro-esophageal reflux disease with esophagitis, without bleeding: Secondary | ICD-10-CM

## 2023-11-11 ENCOUNTER — Other Ambulatory Visit: Payer: Self-pay | Admitting: Primary Care

## 2023-11-11 ENCOUNTER — Ambulatory Visit: Payer: Medicare Other | Admitting: Obstetrics and Gynecology

## 2023-11-11 ENCOUNTER — Other Ambulatory Visit: Payer: Self-pay

## 2023-11-11 ENCOUNTER — Encounter: Payer: Self-pay | Admitting: Obstetrics and Gynecology

## 2023-11-11 VITALS — BP 119/74 | HR 62

## 2023-11-11 DIAGNOSIS — N958 Other specified menopausal and perimenopausal disorders: Secondary | ICD-10-CM | POA: Diagnosis not present

## 2023-11-11 DIAGNOSIS — Z1231 Encounter for screening mammogram for malignant neoplasm of breast: Secondary | ICD-10-CM

## 2023-11-11 DIAGNOSIS — R9389 Abnormal findings on diagnostic imaging of other specified body structures: Secondary | ICD-10-CM | POA: Diagnosis not present

## 2023-11-11 MED ORDER — ESTRADIOL 0.1 MG/GM VA CREA: TOPICAL_CREAM | VAGINAL | 12 refills | Status: DC

## 2023-11-11 NOTE — Progress Notes (Signed)
 NEW GYNECOLOGY PATIENT Patient name: Marie Ruiz MRN [id number]  Date of birth: 1945-08-14 Chief Complaint:   Gynecologic Exam     History:  Marie Ruiz is a 78 y.o. No obstetric history on file. being seen today for CPP and abnormal ultrasound finding. Started having pain for about 1.5 years ago and was told that cyst sin her uterus are causing her pain. Feels itching in the vagina and wears a pad due to the cream she uses Was initially told that the pain was due to vaginal dryness. She had been experiencing pain in the groin to the point it would be difficult to walk. She got coconout oil but wasn't sure how to place it. Seh then started using Replense which has been helping and the pain has not been as severe and no longer having episodes where she can't walk due to pain. She denies any vaginal bleeding since the pain started.      Gynecologic History No LMP recorded. Patient is postmenopausal. Contraception: post menopausal status Last Pap: n/a Last Mammogram:  11/2022 BIRADS 1 Last Colonoscopy:  2021  Obstetric History OB History  No obstetric history on file.    Past Medical History:  Diagnosis Date   Acute diarrhea 01/28/2023   Allergy    seasonal   Anxiety    Aortic aneurysm (HCC) 08/2018   Arthritis    Blood clotting disorder (HCC)    Bone spur    with excision   Chickenpox    Colon polyps    DVT (deep venous thrombosis) (HCC)    Glaucoma    both   Graves disease 03/30/2013   History of blood clots 10/2017   Hyperlipemia    Hypertension    Osteopenia    Prediabetes    Restless leg syndrome     Past Surgical History:  Procedure Laterality Date   BREAST CYST ASPIRATION     CATARACT EXTRACTION W/ INTRAOCULAR LENS IMPLANT Right 2015   CATARACT EXTRACTION W/PHACO Left 2017   COLONOSCOPY  2014   DILATION AND CURETTAGE OF UTERUS      Current Outpatient Medications on File Prior to Visit  Medication Sig Dispense Refill   acetaminophen   (TYLENOL ) 500 MG tablet Take 500 mg by mouth as needed for mild pain.     amLODipine -benazepril  (LOTREL) 5-20 MG capsule TAKE 1 CAPSULE BY MOUTH DAILY. FOR BLOOD PRESSURE. 90 capsule 2   b complex vitamins capsule Take 1 capsule by mouth daily.     brimonidine (ALPHAGAN) 0.2 % ophthalmic solution 1 drop 2 (two) times daily.     Calcium  Carbonate-Vit D-Min (CALCIUM  1200 PO) Take by mouth.     cetirizine  (ZYRTEC ) 10 MG tablet Take 1 tablet (10 mg total) by mouth daily. 30 tablet 11   cholecalciferol (VITAMIN D) 1000 units tablet Take 1,000 Units by mouth daily.     cyanocobalamin (VITAMIN B12) 1000 MCG tablet Take 1,000 mcg by mouth daily.     dorzolamide-timolol (COSOPT) 22.3-6.8 MG/ML ophthalmic solution Place 1 drop into both eyes 2 (two) times daily.     Evolocumab  (REPATHA  SURECLICK) 140 MG/ML SOAJ Inject 140 mg into the skin every 14 (fourteen) days. 6 mL 3   FLUoxetine  (PROZAC ) 20 MG capsule TAKE 1 CAPSULE (20 MG TOTAL) BY MOUTH DAILY. FOR ANXIETY 90 capsule 2   fluticasone  (FLONASE ) 50 MCG/ACT nasal spray Place 2 sprays into both nostrils daily. 16 g 6   MAGNESIUM PO Take by mouth.     Multiple  Vitamin (MULTIVITAMIN) tablet Take 1 tablet by mouth daily.     OVER THE COUNTER MEDICATION Blue - Emu     OVER THE COUNTER MEDICATION Bio true     pantoprazole  (PROTONIX ) 20 MG tablet TAKE 1 TABLET BY MOUTH 2 TIMES DAILY BEFORE A MEAL FOR HEARTBURN. 180 tablet 2   sodium chloride  (OCEAN) 0.65 % SOLN nasal spray Place 1 spray into both nostrils as needed. 30 mL 5   traZODone  (DESYREL ) 50 MG tablet Take 1 tablet (50 mg total) by mouth at bedtime as needed for sleep. 90 tablet 1   trolamine salicylate (ASPERCREME) 10 % cream Apply 1 application  topically as needed for muscle pain.     sucralfate  (CARAFATE ) 1 g tablet Take 1 tablet (1 g total) by mouth 4 (four) times daily -  with meals and at bedtime for 10 days. 40 tablet 0   No current facility-administered medications on file prior to visit.     Allergies  Allergen Reactions   Amitriptyline Other (See Comments)    hallucinations   Codeine    Crestor  [Rosuvastatin ]     Myalgias    Lipitor [Atorvastatin ]     Myaglias    Prednisone     Depression    Social History:  reports that she quit smoking about 35 years ago. Her smoking use included cigarettes. She has never used smokeless tobacco. She reports current alcohol use of about 2.0 standard drinks of alcohol per week. She reports that she does not currently use drugs.  Family History  Problem Relation Age of Onset   Heart disease Mother    Heart attack Father    Breast cancer Sister 56   Colon polyps Sister    Aneurysm Brother    Multiple myeloma Sister        found in stage 4   Breast cancer Maternal Grandmother    Esophageal cancer Maternal Aunt 82   Colon cancer Neg Hx    Rectal cancer Neg Hx    Stomach cancer Neg Hx     The following portions of the patient's history were reviewed and updated as appropriate: allergies, current medications, past family history, past medical history, past social history, past surgical history and problem list.  Review of Systems Pertinent items noted in HPI and remainder of comprehensive ROS otherwise negative.  Physical Exam:  BP 119/74   Pulse 62  Physical Exam Vitals and nursing note reviewed. Exam conducted with a chaperone present.  Constitutional:      Appearance: Normal appearance.  Pulmonary:     Effort: Pulmonary effort is normal.  Abdominal:     Palpations: Abdomen is soft.  Genitourinary:    General: Normal vulva.     Exam position: Lithotomy position.     Comments: Urethral caruncle Atrophic vaginal canal and introitus Small cervix without lesions Mild erythmea of posterior courchette Normal appearing vulva Normal vulvar sensation bilaterally Nontender superficial pelvic floor muscles Nontender ischial tuberosities bilaterally  Allodynia at introitus: No  Anal wink present Posterior vaginal wall  nontender Right levator ani 1/10 Right ischiococcygeous 1/10 Right obturator internus 1/10 Left levator ani 1/10 Left ischioccocygeous 1/10 Left obturator internus 1/10 Uterine tenderness not present   Neurological:     Mental Status: She is alert.    Endometrial Biopsy Procedure  Patient identified, informed consent performed,  indication reviewed, consent signed.  Reviewed risk of perforation, pain, bleeding, insufficient sample, etc were reviewd. Time out was performed.  Urine pregnancy test negative.  Speculum placed in the vagina.  Cervix visualized.  Cleaned with Betadine x 2.  Anterior cervix grasped anteriorly with a single tooth tenaculum.  Paracervical block was not administered.  Endometrial pipelle was used to draw up 1cc of 1% lidocaine, attempted to be introduced into endometrial cavity but was unsuccessful. A dilator was attempted to be passed but the external os was completely stenotic. At this time procedure was aborted. Tenaculum was removed, good hemostasis noted.  Patient tolerated procedure well.  Patient was given post-procedure instructions.     Assessment and Plan:   1. Abnormal finding on ultrasound (Primary) Unsuccessful attempt at EMB in office. Offered repeat in office attempt vs sampling in the operating room, opts for operating room due to discomfort of exam. Surgical scheduling referral placed.  - Ambulatory Referral For Surgery Scheduling  2. Genitourinary syndrome of menopause Trial of vaginal estrogen for GSM symptoms - estradiol  (ESTRACE ) 0.1 MG/GM vaginal cream; Apply 1 gram per vagina every night for 2 weeks, then apply three times a week  Dispense: 30 g; Refill: 12    Routine preventative health maintenance measures emphasized. Please refer to After Visit Summary for other counseling recommendations.   Follow-up: No follow-ups on file.      Kiki Pelton, MD Obstetrician & Gynecologist, Faculty Practice Minimally Invasive  [facility] [employer], [facility]

## 2023-11-19 ENCOUNTER — Telehealth: Payer: Self-pay

## 2023-11-19 NOTE — Telephone Encounter (Signed)
 Patient called RN line regarding her appointment she had in office on 11/11/23 and inquires about her next appointment she was suppose to have with [facility]. She has not received a call yet regarding the appointment.  Moira Andrews, RN

## 2023-11-20 NOTE — Telephone Encounter (Signed)
 Patient also called today and left message she is calling about the Estrace  vaginal cream RX that Dr. Elester Grim was going to  prescribe, she states it was not sent to her pharmacy and wants us  to send it in and then let her know it has been sent.  Per chart review Estrace  Rx was entered by Dr. Elester Grim as phone in. I called patient pharmacy and they did not receive RX. I gave RX by phone.  I called [facility] and left  message RX has been sent in today and should be ready for pickup after 2pm. Also the first issue you called about you should receive a letter in the mail with information within a few weeks, if not , call us  back. Will send message to scheduler as well. Alejandra Hurst

## 2023-11-21 ENCOUNTER — Telehealth: Payer: Self-pay

## 2023-11-21 NOTE — Telephone Encounter (Signed)
 I called patient to see if she was available for surgery w/ Dr. Elester Grim on 01/13/24. Patient would like to be schedule at 10 am and will arrive at 8 am for pre-op. I provided pre-op and surgery details by phone. Written confirmation will be sent to patient's Mychart.

## 2023-11-30 ENCOUNTER — Other Ambulatory Visit: Payer: Self-pay | Admitting: Family

## 2023-11-30 ENCOUNTER — Other Ambulatory Visit: Payer: Self-pay | Admitting: Primary Care

## 2023-11-30 ENCOUNTER — Other Ambulatory Visit: Payer: Self-pay | Admitting: Obstetrics and Gynecology

## 2023-11-30 DIAGNOSIS — K2100 Gastro-esophageal reflux disease with esophagitis, without bleeding: Secondary | ICD-10-CM

## 2023-11-30 DIAGNOSIS — N958 Other specified menopausal and perimenopausal disorders: Secondary | ICD-10-CM

## 2023-11-30 DIAGNOSIS — F411 Generalized anxiety disorder: Secondary | ICD-10-CM

## 2023-12-05 ENCOUNTER — Encounter: Payer: Self-pay | Admitting: Internal Medicine

## 2023-12-05 ENCOUNTER — Ambulatory Visit: Payer: Medicare Other | Attending: Internal Medicine | Admitting: Internal Medicine

## 2023-12-05 VITALS — BP 120/66 | HR 59 | Ht 66.0 in | Wt 197.0 lb

## 2023-12-05 DIAGNOSIS — I77819 Aortic ectasia, unspecified site: Secondary | ICD-10-CM | POA: Diagnosis not present

## 2023-12-05 DIAGNOSIS — I10 Essential (primary) hypertension: Secondary | ICD-10-CM | POA: Diagnosis not present

## 2023-12-05 DIAGNOSIS — R252 Cramp and spasm: Secondary | ICD-10-CM

## 2023-12-05 DIAGNOSIS — K2100 Gastro-esophageal reflux disease with esophagitis, without bleeding: Secondary | ICD-10-CM | POA: Diagnosis not present

## 2023-12-05 DIAGNOSIS — E782 Mixed hyperlipidemia: Secondary | ICD-10-CM | POA: Diagnosis not present

## 2023-12-05 NOTE — Progress Notes (Signed)
  Cardiology Office Note:  .   Date:  12/05/2023  ID:  Marie Ruiz, DOB February 24, 1946, MRN [id number] PCP: Gabriel John, NP  [facility] Providers Cardiologist:  Euell Herrlich, MD    History of Present Illness: Marie Ruiz   Marie Ruiz is a 78 y.o. female.  Discussed the use of AI scribe software for clinical note transcription with the patient, who gave verbal consent to proceed.  History of Present Illness LISE PINCUS is a 78 year old female with hyperlipidemia and aortic dilation who presents for a cardiovascular follow-up.  She is on Repatha  140 mg every two weeks for hyperlipidemia, having switched from Praluent . Her LDL cholesterol was 93 mg/dL in February, and she is interested in her current levels after continued use of Repatha . She recalls a coronary CTA three years ago showing normal coronary arteries and a borderline aorta dimension, which is being monitored. She is concerned about the aorta surveillance. Her last echocardiogram was a year ago, with another scheduled next year.  She takes amlodipine -benazepril  5/20 mg for blood pressure management. She experiences calf cramps, which improve with magnesium and Theraworks but remain bothersome. She aims for 5,000 to 10,000 steps per day, using running around the house as exercise. No new cardiac symptoms are reported.    ROS: negative except per HPI above.  Studies Reviewed: .        Results LABS LDL: 102 (08/2023)  RADIOLOGY Coronary CTA: Normal coronary arteries, no calcium , no plaque (2022)  DIAGNOSTIC EKG: Normal (11/2023) Echocardiogram: Mild left ventricular hypertrophy (2024) Risk Assessment/Calculations:       Physical Exam:   VS:  BP 120/66   Pulse (!) 59   Ht 5\' 6"  (1.676 m)   Wt 197 lb (89.4 kg)   SpO2 96%   BMI 31.80 kg/m    Wt Readings from Last 3 Encounters:  12/05/23 197 lb (89.4 kg)  09/30/23 194 lb (88 kg)  09/04/23 196 lb 6 oz (89.1 kg)     Physical  Exam GENERAL: Alert, cooperative, well developed, no acute distress HEENT: Normocephalic, normal oropharynx, moist mucous membranes CHEST: Clear to auscultation bilaterally, no wheezes, rhonchi, or crackles CARDIOVASCULAR: Normal heart rate and rhythm, S1 and S2 normal without murmurs ABDOMEN: Soft, non-tender, non-distended, without organomegaly, normal bowel sounds EXTREMITIES: No cyanosis or edema NEUROLOGICAL: Cranial nerves grossly intact, moves all extremities without gross motor or sensory deficit   ASSESSMENT AND PLAN: .    Assessment and Plan Assessment & Plan Dilation of aorta, likely upper normal.  Aortic dilation stable at 39 mm. No intervention needed. - Schedule echocardiogram before next year's visit for surveillance per patient preference.  Hypertension Blood pressure well-controlled. Mild myocardial hypertrophy likely due to hypertension. EKG stable.  Hyperlipidemia Transitioned to Repatha  140 mg biweekly. Awaiting current lab results to evaluate effectiveness. - Order lipid panel today.  Heartburn/GERD Chronic heartburn managed with medication.  Leg cramps Leg cramps not medication-related. Magnesium provides some relief. Discussed Theraworks and mustard as remedies. Advised on supportive footwear and hydration. Not on statin.       Grady Lawman, MD, FACC

## 2023-12-05 NOTE — Patient Instructions (Signed)
 Medication Instructions:  No Changes *If you need a refill on your cardiac medications before your next appointment, please call your pharmacy*  Lab Work: Today: Lipid Panel  If you have labs (blood work) drawn today and your tests are completely normal, you will receive your results only by: MyChart Message (if you have MyChart) OR A paper copy in the mail If you have any lab test that is abnormal or we need to change your treatment, we will call you to review the results.  Testing/Procedures: Your physician has requested that you have an echocardiogram in about one year right before seeing Dr. Chancy Comber. Echocardiography is a painless test that uses sound waves to create images of your heart. It provides your doctor with information about the size and shape of your heart and how well your heart's chambers and valves are working. This procedure takes approximately one hour. There are no restrictions for this procedure. Please do NOT wear cologne, perfume, aftershave, or lotions (deodorant is allowed). Please arrive 15 minutes prior to your appointment time. This will take place at [address]   Follow-Up: At [facility], you and your health needs are our priority.  As part of our continuing mission to provide you with exceptional heart care, our providers are all part of one team.  This team includes your primary Cardiologist (physician) and Advanced Practice Providers or APPs (Physician Assistants and Nurse Practitioners) who all work together to provide you with the care you need, when you need it.  Your next appointment:   1 year(s)  Provider:   Gayatri A Acharya, MD    Other Instructions Please call us  or send a MyChart message with any Cardiology related questions/concerns.  [phone].  Thank you!

## 2023-12-06 LAB — LIPID PANEL
Chol/HDL Ratio: 2.8 ratio (ref 0.0–4.4)
Cholesterol, Total: 216 mg/dL — ABNORMAL HIGH (ref 100–199)
HDL: 77 mg/dL (ref >39)
LDL Chol Calc (NIH): 114 mg/dL — ABNORMAL HIGH (ref 0–99)
Triglycerides: 143 mg/dL (ref 0–149)
VLDL Cholesterol Cal: 25 mg/dL (ref 5–40)

## 2023-12-12 ENCOUNTER — Ambulatory Visit
Admission: RE | Admit: 2023-12-12 | Discharge: 2023-12-12 | Disposition: A | Discharge status: Home / Self Care | Source: Ambulatory Visit | Attending: Primary Care | Admitting: Primary Care

## 2023-12-12 DIAGNOSIS — Z1231 Encounter for screening mammogram for malignant neoplasm of breast: Secondary | ICD-10-CM

## 2023-12-16 ENCOUNTER — Ambulatory Visit: Payer: Self-pay | Admitting: Internal Medicine

## 2023-12-16 DIAGNOSIS — E782 Mixed hyperlipidemia: Secondary | ICD-10-CM

## 2023-12-16 DIAGNOSIS — E785 Hyperlipidemia, unspecified: Secondary | ICD-10-CM

## 2023-12-18 ENCOUNTER — Ambulatory Visit: Payer: Self-pay | Admitting: Primary Care

## 2023-12-18 NOTE — Telephone Encounter (Signed)
 Spoke to pt. Results and Recommendations given. She agrees to see/speak to PharmD (Lipids). She states she has "run into a woman problem and will have a procedure on 01/13/2024". She feels okay but is planning on feeling much better after procedure. In the past, she remembers she spoke to a Pharmacist on the phone but does not recall coming in for an office visit. Will enter order for Pharm D/Lipid Clinic [facility].

## 2023-12-26 ENCOUNTER — Telehealth: Payer: Self-pay | Admitting: Family Medicine

## 2023-12-26 NOTE — Telephone Encounter (Signed)
 Patient requested an appt with Dr.Ajewole because she has questions about her procedure that is scheduled for the end of June. I called pt to let her know that she the Dr does not have any availability before the surgery that is scheduled. Patient says she just has a few questions that a nurse may be able to answer.

## 2023-12-29 ENCOUNTER — Other Ambulatory Visit: Payer: Self-pay | Admitting: Obstetrics and Gynecology

## 2023-12-29 ENCOUNTER — Encounter: Payer: Self-pay | Admitting: Obstetrics and Gynecology

## 2023-12-29 DIAGNOSIS — N882 Stricture and stenosis of cervix uteri: Secondary | ICD-10-CM

## 2023-12-29 MED ORDER — MISOPROSTOL 200 MCG PO TABS: ORAL_TABLET | ORAL | 0 refills | Status: DC

## 2024-01-02 ENCOUNTER — Other Ambulatory Visit: Payer: Self-pay | Admitting: Family

## 2024-01-02 ENCOUNTER — Other Ambulatory Visit: Payer: Self-pay | Admitting: Primary Care

## 2024-01-02 DIAGNOSIS — F411 Generalized anxiety disorder: Secondary | ICD-10-CM

## 2024-01-02 DIAGNOSIS — K2100 Gastro-esophageal reflux disease with esophagitis, without bleeding: Secondary | ICD-10-CM

## 2024-01-02 NOTE — Telephone Encounter (Signed)
 Please call patient:  We received a refill request for her fluoxetine /Prozac  medication for depression.  I see that she is establishing with a new PCP on 06/30.  Does she have enough fluoxetine  to make it to that appointment?

## 2024-01-02 NOTE — Telephone Encounter (Signed)
 Sounds good

## 2024-01-02 NOTE — Telephone Encounter (Signed)
 Called and spoke with patient, she has 1 week worth of pills left, she will need refill to make it to [facility] appt. Patient advised she is only transferring due to location being more convenient for her.

## 2024-01-10 ENCOUNTER — Encounter (HOSPITAL_COMMUNITY): Payer: Self-pay | Admitting: Obstetrics and Gynecology

## 2024-01-12 ENCOUNTER — Encounter (HOSPITAL_COMMUNITY): Payer: Self-pay | Admitting: Obstetrics and Gynecology

## 2024-01-12 ENCOUNTER — Other Ambulatory Visit: Payer: Self-pay

## 2024-01-12 NOTE — Anesthesia Preprocedure Evaluation (Signed)
 Anesthesia Evaluation  Patient identified by MRN, date of birth, ID band Patient awake    Reviewed: Allergy & Precautions, NPO status , Patient's Chart, lab work & pertinent test results  Airway Mallampati: II  TM Distance: >3 FB Neck ROM: Full    Dental  (+) Dental Advisory Given   Pulmonary former smoker   breath sounds clear to auscultation       Cardiovascular hypertension, Pt. on medications + Peripheral Vascular Disease and + DVT   Rhythm:Regular Rate:Normal     Neuro/Psych negative neurological ROS     GI/Hepatic Neg liver ROS,GERD  ,,  Endo/Other  negative endocrine ROS    Renal/GU negative Renal ROS     Musculoskeletal  (+) Arthritis ,    Abdominal   Peds  Hematology negative hematology ROS (+)   Anesthesia Other Findings   Reproductive/Obstetrics                             Anesthesia Physical Anesthesia Plan  ASA: 3  Anesthesia Plan: General   Post-op Pain Management: Tylenol  PO (pre-op)*   Induction: Intravenous  PONV Risk Score and Plan: 3 and Dexamethasone, Ondansetron and Treatment may vary due to age or medical condition  Airway Management Planned: LMA  Additional Equipment: None  Intra-op Plan:   Post-operative Plan: Extubation in OR  Informed Consent: I have reviewed the patients History and Physical, chart, labs and discussed the procedure including the risks, benefits and alternatives for the proposed anesthesia with the patient or authorized representative who has indicated his/her understanding and acceptance.     Dental advisory given  Plan Discussed with: CRNA  Anesthesia Plan Comments:         Anesthesia Quick Evaluation

## 2024-01-12 NOTE — Progress Notes (Signed)
 Case: [id number] Date/Time: 01/13/24 0853   Procedure: DILATATION AND CURETTAGE /HYSTEROSCOPY - Hysteroscopy/Polyp resect/D&C   Anesthesia type: Choice   Diagnosis:      Abnormal finding on ultrasound [R93.89]     Endometrial polyp [N84.0]   Pre-op diagnosis:      Abnormal finding on ultrasound     polyp   [facility] ROOM 09 / [facility]   Surgeons: Jeralyn Crutch, MD       DISCUSSION: Marie Ruiz is a 78 yo female who is a SDW prior to surgery above. PMH of former smoking, HTN, aortic atherosclerosis, TAA, hx of DVT (2019), RLS, GERD, prediabetes, Graves disease, arthritis, anxiety.  Patient follows with Cardiology for above hx. She was last seen in clinic on 12/05/23. On Repatha . Her TAA is being monitored by Echo. Last one was in 12/2022 and showed normal LVEF, no significant valvular disease, and TAA measuring 41mm. Advised f/u in 1 year.  Patient with hx of DVT. Previously followed by Hematology. Hypercoagulable w/u initially positive for lupus but then negative. She is now off chronic anticoagulation.  Hx of Graves disease. Previously followed with Endocrine. Unclear if she had treatment in the past but per last Endocrine note in 2015 her Graves disease is in remission and being monitored. TSH in 03/2023 was normal.  VS: There were no vitals taken for this visit.  PROVIDERS: Gretta Comer POUR, NP   LABS: Obtain DOS   EKG 12/05/23:  Sinus bradycardia, rate 59   CV: Echo 01/03/23:  IMPRESSIONS    1. Left ventricular ejection fraction, by estimation, is 60 to 65%. The left ventricle has normal function. The left ventricle has no regional wall motion abnormalities. There is mild left ventricular hypertrophy. Left ventricular diastolic parameters are consistent with Grade I diastolic dysfunction (impaired relaxation).  2. Right ventricular systolic function is normal. The right ventricular size is normal.  3. Left atrial size was mildly dilated.  4. The mitral  valve is normal in structure. Trivial mitral valve regurgitation. No evidence of mitral stenosis.  5. The aortic valve is tricuspid. There is mild calcification of the aortic valve. Aortic valve regurgitation is trivial. No aortic stenosis is present.  6. Aortic dilatation noted. There is mild dilatation of the ascending aorta, measuring 41 mm.  7. The inferior vena cava is normal in size with greater than 50% respiratory variability, suggesting right atrial pressure of 3 mmHg.  CTA coronary 05/08/2021:  IMPRESSION: 1. No evidence of CAD, CADRADS = 0.   2. Coronary calcium  score of 0.   3. Normal coronary origins with right dominance.   4. Borderline ascending aorta dimension, upper limit of normal 39 mm at mid ascending aorta. Past Medical History:  Diagnosis Date   Acute diarrhea 01/28/2023   Allergy    seasonal   Anxiety    Aortic aneurysm (HCC) 08/2018   Arthritis    Blood clotting disorder (HCC)    Bone spur    with excision   Chickenpox    Colon polyps    DVT (deep venous thrombosis) (HCC)    GERD (gastroesophageal reflux disease)    Glaucoma    both   Graves disease 03/30/2013   History of blood clots 10/2017   Hyperlipemia    Hypertension    Osteopenia    Prediabetes    Restless leg syndrome     Past Surgical History:  Procedure Laterality Date   BREAST CYST ASPIRATION     CATARACT EXTRACTION W/ INTRAOCULAR LENS  IMPLANT Right 2015   CATARACT EXTRACTION W/PHACO Left 2017   COLONOSCOPY  2014   DILATION AND CURETTAGE OF UTERUS      MEDICATIONS: No current facility-administered medications for this encounter.    acetaminophen  (TYLENOL ) 500 MG tablet   amLODipine -benazepril  (LOTREL) 5-20 MG capsule   b complex vitamins capsule   brimonidine (ALPHAGAN) 0.2 % ophthalmic solution   Calcium  Carbonate-Vit D-Min (CALCIUM  1200 PO)   cetirizine  (ZYRTEC ) 10 MG tablet   cholecalciferol (VITAMIN D) 1000 units tablet   cyanocobalamin (VITAMIN B12) 1000 MCG  tablet   dorzolamide-timolol (COSOPT) 22.3-6.8 MG/ML ophthalmic solution   estradiol  (ESTRACE ) 0.1 MG/GM vaginal cream   Evolocumab  (REPATHA  SURECLICK) 140 MG/ML SOAJ   FLUoxetine  (PROZAC ) 20 MG capsule   fluticasone  (FLONASE ) 50 MCG/ACT nasal spray   MAGNESIUM PO   misoprostol  (CYTOTEC ) 200 MCG tablet   Multiple Vitamin (MULTIVITAMIN) tablet   OVER THE COUNTER MEDICATION   OVER THE COUNTER MEDICATION   pantoprazole  (PROTONIX ) 20 MG tablet   sodium chloride  (OCEAN) 0.65 % SOLN nasal spray   sucralfate  (CARAFATE ) 1 g tablet   traZODone  (DESYREL ) 50 MG tablet   trolamine salicylate (ASPERCREME) 10 % cream   Burnard CHRISTELLA Senna, PA-C [facility] Surgical Short Stay/Anesthesiology [facility] Phone [phone] 01/12/2024 9:43 AM

## 2024-01-12 NOTE — Progress Notes (Signed)
 PCP - Comer Gaskins, NP Cardiologist - Dr Soyla Merck  Chest x-ray - 10/01/23 EKG - 12/05/23 Stress Test - n/a ECHO - 12/05/23 Cardiac Cath - n/a  ICD Pacemaker/Loop - n/a  Sleep Study -  n/a CPAP - none  Diabetes -  n/a  Aspirin & Blood Thinner Instructions:  n/a  NPO   Anesthesia review: Yes  STOP now taking any Aspirin (unless otherwise instructed by your surgeon), Aleve, Naproxen, Ibuprofen, Motrin, Advil, Goody's, BC's, all herbal medications, fish oil, and all vitamins.   Coronavirus Screening Do you have any of the following symptoms:  Cough yes/no: No Fever (>100.59F)  yes/no: No Runny nose yes/no: No Sore throat yes/no: No Difficulty breathing/shortness of breath  yes/no: No  Have you traveled in the last 14 days and where? yes/no: No  Patient verbalized understanding of instructions that were given via phone.

## 2024-01-13 ENCOUNTER — Ambulatory Visit (HOSPITAL_COMMUNITY)
Admission: RE | Admit: 2024-01-13 | Discharge: 2024-01-13 | Disposition: A | Discharge status: Home / Self Care | Attending: Obstetrics and Gynecology | Admitting: Obstetrics and Gynecology

## 2024-01-13 ENCOUNTER — Ambulatory Visit (HOSPITAL_COMMUNITY): Admitting: Medical

## 2024-01-13 ENCOUNTER — Encounter (HOSPITAL_COMMUNITY)
Admission: RE | Disposition: A | Discharge status: Home / Self Care | Payer: Self-pay | Source: Home / Self Care | Attending: Obstetrics and Gynecology

## 2024-01-13 ENCOUNTER — Other Ambulatory Visit: Payer: Self-pay

## 2024-01-13 ENCOUNTER — Encounter (HOSPITAL_COMMUNITY): Payer: Self-pay | Admitting: Obstetrics and Gynecology

## 2024-01-13 DIAGNOSIS — R9389 Abnormal findings on diagnostic imaging of other specified body structures: Secondary | ICD-10-CM

## 2024-01-13 DIAGNOSIS — I10 Essential (primary) hypertension: Secondary | ICD-10-CM | POA: Insufficient documentation

## 2024-01-13 DIAGNOSIS — K219 Gastro-esophageal reflux disease without esophagitis: Secondary | ICD-10-CM | POA: Diagnosis not present

## 2024-01-13 DIAGNOSIS — Z86718 Personal history of other venous thrombosis and embolism: Secondary | ICD-10-CM | POA: Diagnosis not present

## 2024-01-13 DIAGNOSIS — Z87891 Personal history of nicotine dependence: Secondary | ICD-10-CM | POA: Insufficient documentation

## 2024-01-13 DIAGNOSIS — N840 Polyp of corpus uteri: Secondary | ICD-10-CM | POA: Diagnosis present

## 2024-01-13 DIAGNOSIS — E785 Hyperlipidemia, unspecified: Secondary | ICD-10-CM | POA: Diagnosis not present

## 2024-01-13 DIAGNOSIS — R102 Pelvic and perineal pain: Secondary | ICD-10-CM | POA: Diagnosis not present

## 2024-01-13 DIAGNOSIS — M8580 Other specified disorders of bone density and structure, unspecified site: Secondary | ICD-10-CM | POA: Diagnosis not present

## 2024-01-13 HISTORY — PX: HYSTEROSCOPY WITH D & C: SHX1775

## 2024-01-13 HISTORY — DX: Gastro-esophageal reflux disease without esophagitis: K21.9

## 2024-01-13 HISTORY — DX: Thyrotoxicosis, unspecified without thyrotoxic crisis or storm: E05.90

## 2024-01-13 HISTORY — PX: MYOSURE RESECTION: SHX7611

## 2024-01-13 LAB — BASIC METABOLIC PANEL WITH GFR
Anion gap: 11 (ref 5–15)
BUN: 15 mg/dL (ref 8–23)
CO2: 22 mmol/L (ref 22–32)
Calcium: 9.6 mg/dL (ref 8.9–10.3)
Chloride: 102 mmol/L (ref 98–111)
Creatinine, Ser: 0.72 mg/dL (ref 0.44–1.00)
GFR, Estimated: >60 mL/min (ref >60)
Glucose, Bld: 101 mg/dL — ABNORMAL HIGH (ref 70–99)
Potassium: 3.9 mmol/L (ref 3.5–5.1)
Sodium: 135 mmol/L (ref 135–145)

## 2024-01-13 LAB — CBC
HCT: 42.2 % (ref 36.0–46.0)
Hemoglobin: 13.8 g/dL (ref 12.0–15.0)
MCH: 30.4 pg (ref 26.0–34.0)
MCHC: 32.7 g/dL (ref 30.0–36.0)
MCV: 93 fL (ref 80.0–100.0)
Platelets: 308 10*3/uL (ref 150–400)
RBC: 4.54 MIL/uL (ref 3.87–5.11)
RDW: 12.4 % (ref 11.5–15.5)
WBC: 5.7 10*3/uL (ref 4.0–10.5)
nRBC: 0 % (ref 0.0–0.2)

## 2024-01-13 SURGERY — DILATATION AND CURETTAGE /HYSTEROSCOPY
Anesthesia: General | Site: Uterus

## 2024-01-13 MED ORDER — PROPOFOL 10 MG/ML IV BOLUS
INTRAVENOUS | Status: AC
Start: 2024-01-13 — End: 2024-01-13
  Filled 2024-01-13: qty 20

## 2024-01-13 MED ORDER — OXYCODONE HCL 5 MG/5ML PO SOLN: 5.0000 mg | Freq: Once | ORAL | Status: DC | PRN

## 2024-01-13 MED ORDER — KETOROLAC TROMETHAMINE 15 MG/ML IJ SOLN
INTRAMUSCULAR | Status: DC | PRN
  Administered 2024-01-13: 15 mg via INTRAVENOUS

## 2024-01-13 MED ORDER — BUPIVACAINE HCL (PF) 0.5 % IJ SOLN
INTRAMUSCULAR | Status: AC
  Filled 2024-01-13: qty 30

## 2024-01-13 MED ORDER — ONDANSETRON HCL 4 MG/2ML IJ SOLN
INTRAMUSCULAR | Status: DC | PRN
  Administered 2024-01-13: 4 mg via INTRAVENOUS

## 2024-01-13 MED ORDER — OXYCODONE HCL 5 MG PO TABS: 5.0000 mg | ORAL_TABLET | Freq: Once | ORAL | Status: DC | PRN

## 2024-01-13 MED ORDER — SILVER NITRATE-POT NITRATE 75-25 % EX MISC
CUTANEOUS | Status: AC
  Filled 2024-01-13: qty 10

## 2024-01-13 MED ORDER — LIDOCAINE 2% (20 MG/ML) 5 ML SYRINGE
INTRAMUSCULAR | Status: AC
  Filled 2024-01-13: qty 5

## 2024-01-13 MED ORDER — FENTANYL CITRATE (PF) 250 MCG/5ML IJ SOLN
INTRAMUSCULAR | Status: DC | PRN
  Administered 2024-01-13 (×2): 25 ug via INTRAVENOUS

## 2024-01-13 MED ORDER — ONDANSETRON HCL 4 MG/2ML IJ SOLN
INTRAMUSCULAR | Status: AC
  Filled 2024-01-13: qty 2

## 2024-01-13 MED ORDER — BUPIVACAINE HCL (PF) 0.5 % IJ SOLN
INTRAMUSCULAR | Status: DC | PRN
  Administered 2024-01-13: 20 mL

## 2024-01-13 MED ORDER — AMISULPRIDE (ANTIEMETIC) 5 MG/2ML IV SOLN
10.0000 mg | Freq: Once | INTRAVENOUS | Status: DC | PRN
Start: 2024-01-13 — End: 2024-01-13

## 2024-01-13 MED ORDER — ACETAMINOPHEN 500 MG PO TABS
1000.0000 mg | ORAL_TABLET | ORAL | Status: AC
  Administered 2024-01-13: 500 mg via ORAL
  Filled 2024-01-13: qty 2

## 2024-01-13 MED ORDER — PROPOFOL 10 MG/ML IV BOLUS
INTRAVENOUS | Status: DC | PRN
  Administered 2024-01-13: 160 mg via INTRAVENOUS

## 2024-01-13 MED ORDER — LACTATED RINGERS IV SOLN: INTRAVENOUS | Status: DC

## 2024-01-13 MED ORDER — LIDOCAINE 2% (20 MG/ML) 5 ML SYRINGE
INTRAMUSCULAR | Status: DC | PRN
  Administered 2024-01-13: 60 mg via INTRAVENOUS

## 2024-01-13 MED ORDER — DEXAMETHASONE SODIUM PHOSPHATE 10 MG/ML IJ SOLN
INTRAMUSCULAR | Status: AC
  Filled 2024-01-13: qty 1

## 2024-01-13 MED ORDER — FENTANYL CITRATE (PF) 250 MCG/5ML IJ SOLN
INTRAMUSCULAR | Status: AC
Start: 2024-01-13 — End: 2024-01-13
  Filled 2024-01-13: qty 5

## 2024-01-13 MED ORDER — KETOROLAC TROMETHAMINE 30 MG/ML IJ SOLN
INTRAMUSCULAR | Status: AC
  Filled 2024-01-13: qty 1

## 2024-01-13 MED ORDER — CHLORHEXIDINE GLUCONATE 0.12 % MT SOLN
15.0000 mL | Freq: Once | OROMUCOSAL | Status: AC
  Administered 2024-01-13: 15 mL via OROMUCOSAL
  Filled 2024-01-13: qty 15

## 2024-01-13 MED ORDER — ORAL CARE MOUTH RINSE
15.0000 mL | Freq: Once | OROMUCOSAL | Status: AC
Start: 2024-01-13 — End: 2024-01-13

## 2024-01-13 MED ORDER — FENTANYL CITRATE (PF) 100 MCG/2ML IJ SOLN: 25.0000 ug | INTRAMUSCULAR | Status: DC | PRN

## 2024-01-13 MED ORDER — DEXAMETHASONE SODIUM PHOSPHATE 10 MG/ML IJ SOLN
INTRAMUSCULAR | Status: DC | PRN
  Administered 2024-01-13: 10 mg via INTRAVENOUS

## 2024-01-13 SURGICAL SUPPLY — 16 items
CATH ROBINSON RED A/P 16FR (CATHETERS) IMPLANT
COVER MAYO STAND STRL (DRAPES) ×2 IMPLANT
DEVICE MYOSURE LITE (MISCELLANEOUS) IMPLANT
DEVICE MYOSURE REACH (MISCELLANEOUS) IMPLANT
DILATOR CANAL MILEX (MISCELLANEOUS) IMPLANT
GAUZE 4X4 16PLY ~~LOC~~+RFID DBL (SPONGE) IMPLANT
GLOVE BIO SURGEON STRL SZ7 (GLOVE) ×2 IMPLANT
GLOVE BIOGEL PI IND STRL 7.0 (GLOVE) ×2 IMPLANT
GOWN STRL REUS W/ TWL XL LVL3 (GOWN DISPOSABLE) ×2 IMPLANT
KIT PROCEDURE FLUENT (KITS) ×2 IMPLANT
KIT TURNOVER KIT B (KITS) ×2 IMPLANT
PACK VAGINAL MINOR WOMEN LF (CUSTOM PROCEDURE TRAY) ×2 IMPLANT
PAD OB MATERNITY 11 LF (PERSONAL CARE ITEMS) ×2 IMPLANT
SEAL CERVICAL OMNI LOK (ABLATOR) IMPLANT
SEAL ROD LENS SCOPE MYOSURE (ABLATOR) ×2 IMPLANT
SYSTEM TISS REMOVAL MYOSURE XL (MISCELLANEOUS) IMPLANT

## 2024-01-13 NOTE — Discharge Instructions (Addendum)
 Post-surgical Instructions, Outpatient Surgery  You may expect to feel dizzy, weak, and drowsy for as long as 24 hours after receiving the medicine that made you sleep (anesthetic). For the first 24 hours after your surgery:   Do not drive a car, ride a bicycle, participate in physical activities, or take public transportation until you are done taking narcotic pain medicines or as directed by Dr. Jeralyn.  Do not drink alcohol or take tranquilizers.  Do not take medicine that has not been prescribed by your physicians.  Do not sign important papers or make important decisions while on narcotic pain medicines.  Have a responsible person with you.   PAIN MANAGEMENT Ibuprofen 800mg .  (This is the same as 4-200mg  over the counter tablets of Motrin or ibuprofen.)  Take this every 6 hours or as needed for cramping.   Acetaminophen  1000mg  (This is the same as 2-500mg  over the counter extra strength tylenol ). Take this every 6 hours for the first 3 days or as needed afterwards for pain  DO'S AND DON'T'S Do not take a tub bath for 2 weeks.  You may shower on the first day after your surgery Do move around as you feel able.  Stairs are fine.  You may begin to exercise again as you feel able.  Do not lift any weights for two weeks. Do not put anything in the vagina for two weeks--no tampons, intercourse, or douching.    REGULAR MEDIATIONS/VITAMINS: You may restart all of your regular medications as prescribed. You may restart all of your vitamins as you normally take them.    PLEASE CALL OR SEEK MEDICAL CARE IF: You have persistent nausea and vomiting.  You have trouble eating or drinking.  You have an oral temperature above 100.5.  You have constipation that is not helped by adjusting diet or increasing fluid intake. Pain medicines are a common cause of constipation.  You have heavy vaginal bleeding You have redness or drainage from your incision(s) or there is increasing pain or tenderness near  or in the surgical site.    Post Anesthesia Home Care Instructions  Activity: Get plenty of rest for the remainder of the day. A responsible adult should stay with you for 24 hours following the procedure.  For the next 24 hours, DO NOT: -Drive a car -Advertising copywriter -Drink alcoholic beverages -Take any medication unless instructed by your physician -Make any legal decisions or sign important papers.  Meals: Start with liquid foods such as gelatin or soup. Progress to regular foods as tolerated. Avoid greasy, spicy, heavy foods. If nausea and/or vomiting occur, drink only clear liquids until the nausea and/or vomiting subsides. Call your physician if vomiting continues.  Special Instructions/Symptoms: Your throat may feel dry or sore from the anesthesia or the breathing tube placed in your throat during surgery. If this causes discomfort, gargle with warm salt water. The discomfort should disappear within 24 hours.

## 2024-01-13 NOTE — Discharge Instr - Supplementary Instructions (Signed)
 May take Tylenol after 2pm if needed for discomfort.

## 2024-01-13 NOTE — Transfer of Care (Signed)
 Immediate Anesthesia Transfer of Care Note  Patient: Marie Ruiz  Procedure(s) Performed: DILATATION AND CURETTAGE /HYSTEROSCOPY (Uterus) MYOSURE RESECTION (Uterus)  Patient Location: PACU  Anesthesia Type:General  Level of Consciousness: awake, drowsy, and patient cooperative  Airway & Oxygen Therapy: Patient Spontanous Breathing  Post-op Assessment: Report given to RN, Post -op Vital signs reviewed and stable, and Patient moving all extremities  Post vital signs: Reviewed and stable  Last Vitals:  Vitals Value Taken Time  BP 139/76 01/13/24 10:33  Temp    Pulse 59 01/13/24 10:35  Resp 11 01/13/24 10:35  SpO2 96 % 01/13/24 10:35  Vitals shown include unfiled device data.  Last Pain:  Vitals:   01/13/24 0742  TempSrc:   PainSc: 0-No pain         Complications: No notable events documented.

## 2024-01-13 NOTE — H&P (Signed)
 OB/GYN Pre-Op History and Physical  Marie Ruiz is a 78 y.o. No obstetric history on file. presenting for hysteroscopy, D&C for pelvic pain and abnormal ultrasound.       Past Medical History:  Diagnosis Date   Acute diarrhea 01/28/2023   Allergy    seasonal   Anxiety    Aortic aneurysm (HCC) 08/2018   Arthritis    Bilateral hearing loss 03/12/2023   does not wear hearing aids   Blood clotting disorder (HCC)    Bone spur    with excision   Chickenpox    Colon polyps    DVT (deep venous thrombosis) (HCC)    GERD (gastroesophageal reflux disease)    Glaucoma    both   Graves disease 03/30/2013   History of blood clots 10/2017   Hyperlipemia    Hypertension    Hyperthyroidism    no current problems per pt on 01/12/24   Osteopenia    Prediabetes    no meds, does not check blood sugar   Restless leg syndrome     Past Surgical History:  Procedure Laterality Date   BREAST CYST ASPIRATION     CATARACT EXTRACTION W/ INTRAOCULAR LENS IMPLANT Right 2015   CATARACT EXTRACTION W/PHACO Left 2017   COLONOSCOPY  2014   DILATION AND CURETTAGE OF UTERUS      OB History  No obstetric history on file.    Social History   Socioeconomic History   Marital status: Married    Spouse name: Not on file   Number of children: Not on file   Years of education: Not on file   Highest education level: Not on file  Occupational History   Occupation: Retired  Tobacco Use   Smoking status: Former    Current packs/day: 0.00    Types: Cigarettes    Quit date: 1990    Years since quitting: 35.5   Smokeless tobacco: Never  Vaping Use   Vaping status: Never Used  Substance and Sexual Activity   Alcohol use: Yes    Alcohol/week: 2.0 standard drinks of alcohol    Types: 2 Standard drinks or equivalent per week   Drug use: Not Currently   Sexual activity: Not Currently    Birth control/protection: Post-menopausal  Other Topics Concern   Not on file  Social History  Narrative   Lives in [place] , moving to [place].   Retired.   Married.    Social Drivers of Corporate investment banker Strain: Low Risk  (10/31/2022)   Overall Financial Resource Strain (CARDIA)    Difficulty of Paying Living Expenses: Not hard at all  Food Insecurity: No Food Insecurity (10/31/2022)   Hunger Vital Sign    Worried About Running Out of Food in the Last Year: Never true    Ran Out of Food in the Last Year: Never true  Transportation Needs: No Transportation Needs (10/31/2022)   PRAPARE - Administrator, Civil Service (Medical): No    Lack of Transportation (Non-Medical): No  Physical Activity: Inactive (10/31/2022)   Exercise Vital Sign    Days of Exercise per Week: 0 days    Minutes of Exercise per Session: 0 min  Stress: No Stress Concern Present (10/31/2022)   [employer] of Occupational Health - Occupational Stress Questionnaire    Feeling of Stress : Not at all  Social Connections: Socially Integrated (10/31/2022)   Social Connection and Isolation Panel    Frequency of Communication with Friends and Family:  More than three times a week    Frequency of Social Gatherings with Friends and Family: More than three times a week    Attends Religious Services: More than 4 times per year    Active Member of Clubs or Organizations: Yes    Attends Engineer, structural: More than 4 times per year    Marital Status: Married    Family History  Problem Relation Age of Onset   Heart disease Mother    Heart attack Father    Breast cancer Sister 30   Colon polyps Sister    Aneurysm Brother    Multiple myeloma Sister        found in stage 4   Breast cancer Maternal Grandmother    Esophageal cancer Maternal Aunt 82   Colon cancer Neg Hx    Rectal cancer Neg Hx    Stomach cancer Neg Hx     Medications Prior to Admission  Medication Sig Dispense Refill Last Dose/Taking   acetaminophen  (TYLENOL ) 500 MG tablet Take 500 mg by mouth as needed for mild  pain.   Taking As Needed   ALPHAGAN P 0.1 % SOLN Place 1 drop into both eyes 2 (two) times daily.   Taking   amLODipine -benazepril  (LOTREL) 5-20 MG capsule TAKE 1 CAPSULE BY MOUTH DAILY. FOR BLOOD PRESSURE. 90 capsule 2 Taking   b complex vitamins capsule Take 1 capsule by mouth daily.   Taking   Calcium  Carb-Cholecalciferol (CALCIUM  + VITAMIN D3 PO) Take 1 tablet by mouth daily.   Taking   cetirizine  (ZYRTEC ) 10 MG tablet Take 1 tablet (10 mg total) by mouth daily. (Patient taking differently: Take 10 mg by mouth at bedtime.) 30 tablet 11 Taking Differently   cholecalciferol (VITAMIN D) 1000 units tablet Take 1,000 Units by mouth daily.   Taking   cyanocobalamin (VITAMIN B12) 1000 MCG tablet Take 1,000 mcg by mouth daily.   Taking   dorzolamide-timolol (COSOPT) 22.3-6.8 MG/ML ophthalmic solution Place 1 drop into both eyes 2 (two) times daily.   Taking   Evolocumab  (REPATHA  SURECLICK) 140 MG/ML SOAJ Inject 140 mg into the skin every 14 (fourteen) days. 6 mL 3 Taking   FLUoxetine  (PROZAC ) 20 MG capsule TAKE 1 CAPSULE BY MOUTH DAILY. FOR ANXIETY 90 capsule 0 Taking   fluticasone  (FLONASE ) 50 MCG/ACT nasal spray Place 2 sprays into both nostrils daily. 16 g 6 Taking   MAGNESIUM PO Take by mouth.   Taking   misoprostol  (CYTOTEC ) 200 MCG tablet Place two tablets in the vagina the night prior to your procedure 2 tablet 0 Taking   Multiple Vitamin (MULTIVITAMIN) tablet Take 1 tablet by mouth daily.   Taking   OVER THE COUNTER MEDICATION Apply 1 application  topically as needed (pain). Blue - Emu   Taking As Needed   OVER THE COUNTER MEDICATION Bio true   Taking   pantoprazole  (PROTONIX ) 20 MG tablet TAKE 1 TABLET BY MOUTH 2 TIMES DAILY BEFORE A MEAL FOR HEARTBURN. 180 tablet 2 Taking   sodium chloride  (OCEAN) 0.65 % SOLN nasal spray Place 1 spray into both nostrils as needed. 30 mL 5 Taking As Needed   traZODone  (DESYREL ) 50 MG tablet Take 1 tablet (50 mg total) by mouth at bedtime as needed for sleep.  90 tablet 1 Taking As Needed   trolamine salicylate (ASPERCREME) 10 % cream Apply 1 application  topically as needed for muscle pain.   Taking As Needed   estradiol  (ESTRACE ) 0.1 MG/GM vaginal cream  APPLY ONE GRAM VAGINALLY AT BEDTIME FOR 2 WEEKS THEN THREE TIMES WEEKLY THEREAFTER (Patient not taking: Reported on 12/05/2023) 42.5 g 11 Not Taking    Allergies  Allergen Reactions   Amitriptyline Other (See Comments)    hallucinations   Codeine    Crestor  [Rosuvastatin ]     Myalgias    Lipitor [Atorvastatin ]     Myaglias    Prednisone     Depression    Review of Systems: Negative except for what is mentioned in HPI.     Physical Exam: BP 127/81   Pulse 69   Temp 98.1 F (36.7 C) (Oral)   Resp 20   Ht 5' 6 (1.676 m)   Wt 88.9 kg   SpO2 96%   BMI 31.64 kg/m  CONSTITUTIONAL: Well-developed, well-nourished and in no acute distress.  HENT:  Normocephalic, atraumatic, External right and left ear normal. Oropharynx is clear and moist EYES: Conjunctivae and EOM are normal. Pupils are equal, round, and reactive to light. No scleral icterus.  NECK: Normal range of motion, supple, no masses SKIN: Skin is warm and dry. No rash noted. Not diaphoretic. No erythema. No pallor. NEUROLGIC: Alert and oriented to person, place, and time. Normal reflexes, muscle tone coordination. No cranial nerve deficit noted. PSYCHIATRIC: Normal mood and affect. Normal behavior. Normal judgment and thought content. RESPIRATORY: Normal effort PELVIC: Deferred   Pertinent Labs/Studies:   No results found for this or any previous visit (from the past 72 hours).     Assessment and Plan :SANITA ESTRADA is a 78 y.o. No obstetric history on file. here for dilation and curettage.   Patient desires surgical management with hysteroscopy, D&C.  The risks of surgery were discussed in detail with the patient including but not limited to: bleeding which may require transfusion or reoperation; infection which  may require prolonged hospitalization or re-hospitalization and antibiotic therapy; injury to bowel, bladder, ureters and major vessels or other surrounding organs which may lead to other procedures; formation of adhesions; need for additional procedures including laparotomy or subsequent procedures secondary to intraoperative injury or abnormal pathology; thromboembolic phenomenon; incisional problems and other postoperative or anesthesia complications.  Patient was told that the likelihood that her condition and symptoms will be treated effectively with this surgical management was high; the postoperative expectations were also discussed in detail. The patient also understands the alternative treatment options which were discussed in full. All questions were answered.  She was told that she will be contacted by our surgical scheduler regarding the time and date of her surgery; routine preoperative instructions will be given to her by the preoperative nursing team.    Printed patient education handouts about the procedure were given to the patient to review at home.    Dolton Shaker, M.D. Minimally Invasive Gynecologic Surgery and Pelvic Pain Specialist Attending Obstetrician & Gynecologist, [facility] for [employer], [facility]

## 2024-01-13 NOTE — Op Note (Signed)
 Marie Ruiz PROCEDURE DATE: 01/13/2024  PREOPERATIVE DIAGNOSIS: thickened endometrium  POSTOPERATIVE DIAGNOSIS: thickened endometrium PROCEDURE:  operative hysteroscopy SURGEON: Carter Quarry, MD ASSISTANT:  none  INDICATIONS: 78 y.o. No obstetric history on file. with thickened endometrium on ultrasound and pelvic pain.  Risks of surgery were discussed with the patient including but not limited to: bleeding which may require transfusion; infection which may require antibiotics; injury to surrounding organs; need for additional procedures including laparotomy;  and other postoperative/anesthesia complications. Written informed consent was obtained.    FINDINGS:  Normal external genitalia, atrophic cervix with stenotic external os.  Hysteroscopically: multicystic endometrial cavity with mild erythema, bilateral tubal ostia visualized  ANESTHESIA: General, paracervical block INTRAVENOUS FLUIDS:  700 ml of LR ESTIMATED BLOOD LOSS:  15 ml URINE OUTPUT: n/a SPECIMENS: endometrial curettings COMPLICATIONS:  None immediate.   FLUID DEFICIT: 160 ml of normal saline  PROCEDURE: The patient was taken to the operating room and placed under general anesthesia. SCDs were in place.  Time out was performed. Patient was placed in dorsolithotomy in Gilbert stirrups. She was prepped and draped in the usual sterile fashion.  A speculum was placeed in the vagina. The cervix was visualized anteriorly and grasped with a single-tooth tenaculum. Paracervical block was performed with 0.5% bupivicaine with 20 cc injected. The external os was initial pierced with the spinal needle. Lacrimal duct dilators were then gently introduced into the cervix followed by the os finder. Sequential dilation was performed with Fredirick dilators. The hysteroscope was inserted and the endometrial cavity and inspected. There were th above findings noted in the endometrial cavity with both ostia seen. The myosure lite was used to  resect the cystic appearing tissue. The hysteroscope was removed. Sharp curettage was performed in all 4 quadrants. All instruments were removed from the vagina. All instrument, needle and lap counts were correct x2. The patient was awakened and is recovering in stable condition.  Carter Quarry, MD Minimally Invasive Gynecologic Surgery  Obstetrics and Gynecology, [facility], [facility] 01/13/2024

## 2024-01-13 NOTE — Anesthesia Postprocedure Evaluation (Signed)
 Anesthesia Post Note  Patient: Marie Ruiz  Procedure(s) Performed: DILATATION AND CURETTAGE /HYSTEROSCOPY (Uterus) MYOSURE RESECTION (Uterus)     Patient location during evaluation: PACU Anesthesia Type: General Level of consciousness: awake and alert Pain management: pain level controlled Vital Signs Assessment: post-procedure vital signs reviewed and stable Respiratory status: spontaneous breathing, nonlabored ventilation, respiratory function stable and patient connected to nasal cannula oxygen Cardiovascular status: blood pressure returned to baseline and stable Postop Assessment: no apparent nausea or vomiting Anesthetic complications: no  No notable events documented.  Last Vitals:  Vitals:   01/13/24 1100 01/13/24 1101  BP:  (!) 143/75  Pulse: (!) 56 (!) 56  Resp: 12 14  Temp: 37.3 C   SpO2: 96% 96%    Last Pain:  Vitals:   01/13/24 1101  TempSrc:   PainSc: 0-No pain                 Epifanio Lamar BRAVO

## 2024-01-13 NOTE — Brief Op Note (Signed)
 01/13/2024  10:36 AM  PATIENT:  Blima VEAR Dais  78 y.o. female  PRE-OPERATIVE DIAGNOSIS:  Abnormal finding on ultrasound polyp  POST-OPERATIVE DIAGNOSIS:  Abnormal finding on ultrasoundpolyp  PROCEDURE:  Procedure(s) with comments: DILATATION AND CURETTAGE /HYSTEROSCOPY (N/A) - Hysteroscopy/Polyp resect/D&C MYOSURE RESECTION  SURGEON:  Surgeons and Role:    DEWAINE Jeralyn Crutch, MD - Primary  PHYSICIAN ASSISTANT: n/a  ASSISTANTS: none   ANESTHESIA:   paracervical block  EBL:  15 mL   BLOOD ADMINISTERED:none  DRAINS: none   LOCAL MEDICATIONS USED:  BUPIVICAINE   SPECIMEN:  Source of Specimen:  endometrial curettings  DISPOSITION OF SPECIMEN:  PATHOLOGY  COUNTS:  YES  TOURNIQUET:  * No tourniquets in log *  DICTATION: .Note written in EPIC  PLAN OF CARE: Discharge to home after PACU  PATIENT DISPOSITION:  PACU - hemodynamically stable.   Delay start of Pharmacological VTE agent (>24hrs) due to surgical blood loss or risk of bleeding: not applicable

## 2024-01-14 ENCOUNTER — Encounter (HOSPITAL_COMMUNITY): Payer: Self-pay | Admitting: Obstetrics and Gynecology

## 2024-01-14 LAB — SURGICAL PATHOLOGY

## 2024-01-15 ENCOUNTER — Ambulatory Visit: Payer: Self-pay | Admitting: Obstetrics and Gynecology

## 2024-01-19 ENCOUNTER — Encounter: Payer: Self-pay | Admitting: Internal Medicine

## 2024-01-19 ENCOUNTER — Telehealth: Payer: Self-pay | Admitting: Pharmacy Technician

## 2024-01-19 ENCOUNTER — Ambulatory Visit (INDEPENDENT_AMBULATORY_CARE_PROVIDER_SITE_OTHER): Admitting: Internal Medicine

## 2024-01-19 ENCOUNTER — Other Ambulatory Visit (HOSPITAL_COMMUNITY): Payer: Self-pay

## 2024-01-19 VITALS — BP 110/74 | HR 70 | Temp 98.0°F | Ht 65.5 in | Wt 195.5 lb

## 2024-01-19 DIAGNOSIS — E782 Mixed hyperlipidemia: Secondary | ICD-10-CM

## 2024-01-19 DIAGNOSIS — G4700 Insomnia, unspecified: Secondary | ICD-10-CM | POA: Diagnosis not present

## 2024-01-19 DIAGNOSIS — I7121 Aneurysm of the ascending aorta, without rupture: Secondary | ICD-10-CM | POA: Diagnosis not present

## 2024-01-19 DIAGNOSIS — I10 Essential (primary) hypertension: Secondary | ICD-10-CM

## 2024-01-19 DIAGNOSIS — N840 Polyp of corpus uteri: Secondary | ICD-10-CM

## 2024-01-19 DIAGNOSIS — F411 Generalized anxiety disorder: Secondary | ICD-10-CM

## 2024-01-19 MED ORDER — TRAZODONE HCL 50 MG PO TABS: 50.0000 mg | ORAL_TABLET | Freq: Every evening | ORAL | 1 refills | Status: DC | PRN

## 2024-01-19 NOTE — Telephone Encounter (Signed)
 Received form from insurance/[facility] to do a prior authorization for repatha  but then insurance said prior authorization is still good until 07/21/24. Pt just got 01/11/24 at [facility]

## 2024-01-19 NOTE — Progress Notes (Signed)
 Established Patient Office Visit     CC/Reason for Visit: Establish care, discuss chronic medical conditions, medication refills  HPI: Marie Ruiz is a 78 y.o. female who is coming in today for the above mentioned reasons. Past Medical History is significant for: Glaucoma followed by ophthalmology, hypertension, hyperlipidemia, anxiety, GERD, insomnia, history of ascending aortic aneurysm followed by cardiology then measured 3.8 on a recent echocardiogram.  She recently had a uterine polyp removal and is followed by GYN for this.  She feels well and has no acute concerns or complaints.  She is due for an annual wellness visit.   Past Medical/Surgical History: Past Medical History:  Diagnosis Date   Acute diarrhea 01/28/2023   Allergy    seasonal   Anxiety    Aortic aneurysm (HCC) 08/2018   Arthritis    Bilateral hearing loss 03/12/2023   does not wear hearing aids   Blood clotting disorder (HCC)    Bone spur    with excision   Chickenpox    Colon polyps    DVT (deep venous thrombosis) (HCC)    GERD (gastroesophageal reflux disease)    Glaucoma    both   Graves disease 03/30/2013   History of blood clots 10/2017   Hyperlipemia    Hypertension    Hyperthyroidism    no current problems per pt on 01/12/24   Osteopenia    Prediabetes    no meds, does not check blood sugar   Restless leg syndrome     Past Surgical History:  Procedure Laterality Date   BREAST CYST ASPIRATION     CATARACT EXTRACTION W/ INTRAOCULAR LENS IMPLANT Right 2015   CATARACT EXTRACTION W/PHACO Left 2017   COLONOSCOPY  2014   DILATION AND CURETTAGE OF UTERUS     HYSTEROSCOPY WITH D & C N/A 01/13/2024   Procedure: DILATATION AND CURETTAGE /HYSTEROSCOPY;  Surgeon: Jeralyn Crutch, MD;  Location: [facility] OR;  Service: Gynecology;  Laterality: N/A;  Hysteroscopy/Polyp resect/D&C   MYOSURE RESECTION  01/13/2024   Procedure: MELINDA RESECTION;  Surgeon: Jeralyn Crutch, MD;  Location: [facility] OR;   Service: Gynecology;;    Social History:  reports that she quit smoking about 35 years ago. Her smoking use included cigarettes. She has never used smokeless tobacco. She reports current alcohol use of about 2.0 standard drinks of alcohol per week. She reports that she does not currently use drugs.  Allergies: Allergies  Allergen Reactions   Amitriptyline Other (See Comments)    hallucinations   Codeine    Crestor  [Rosuvastatin ]     Myalgias    Lipitor [Atorvastatin ]     Myaglias    Prednisone     Depression    Family History:  Family History  Problem Relation Age of Onset   Heart disease Mother    Heart attack Father    Breast cancer Sister 34   Colon polyps Sister    Aneurysm Brother    Multiple myeloma Sister        found in stage 4   Breast cancer Maternal Grandmother    Esophageal cancer Maternal Aunt 81   Colon cancer Neg Hx    Rectal cancer Neg Hx    Stomach cancer Neg Hx      Current Outpatient Medications:    acetaminophen  (TYLENOL ) 500 MG tablet, Take 500 mg by mouth as needed for mild pain., Disp: , Rfl:    ALPHAGAN P 0.1 % SOLN, Place 1 drop into both eyes 2 (  two) times daily., Disp: , Rfl:    amLODipine -benazepril  (LOTREL) 5-20 MG capsule, TAKE 1 CAPSULE BY MOUTH DAILY. FOR BLOOD PRESSURE., Disp: 90 capsule, Rfl: 2   b complex vitamins capsule, Take 1 capsule by mouth daily., Disp: , Rfl:    Calcium  Carb-Cholecalciferol (CALCIUM  + VITAMIN D3 PO), Take 1 tablet by mouth daily., Disp: , Rfl:    cetirizine  (ZYRTEC ) 10 MG tablet, Take 1 tablet (10 mg total) by mouth daily., Disp: 30 tablet, Rfl: 11   cholecalciferol (VITAMIN D) 1000 units tablet, Take 1,000 Units by mouth daily., Disp: , Rfl:    cyanocobalamin (VITAMIN B12) 1000 MCG tablet, Take 1,000 mcg by mouth daily., Disp: , Rfl:    dorzolamide-timolol (COSOPT) 22.3-6.8 MG/ML ophthalmic solution, Place 1 drop into both eyes 2 (two) times daily., Disp: , Rfl:    Evolocumab  (REPATHA  SURECLICK) 140 MG/ML  SOAJ, Inject 140 mg into the skin every 14 (fourteen) days., Disp: 6 mL, Rfl: 3   FLUoxetine  (PROZAC ) 20 MG capsule, TAKE 1 CAPSULE BY MOUTH DAILY. FOR ANXIETY, Disp: 90 capsule, Rfl: 0   fluticasone  (FLONASE ) 50 MCG/ACT nasal spray, Place 2 sprays into both nostrils daily., Disp: 16 g, Rfl: 6   MAGNESIUM PO, Take by mouth., Disp: , Rfl:    Multiple Vitamin (MULTIVITAMIN) tablet, Take 1 tablet by mouth daily., Disp: , Rfl:    pantoprazole  (PROTONIX ) 20 MG tablet, TAKE 1 TABLET BY MOUTH 2 TIMES DAILY BEFORE A MEAL FOR HEARTBURN., Disp: 180 tablet, Rfl: 2   sodium chloride  (OCEAN) 0.65 % SOLN nasal spray, Place 1 spray into both nostrils as needed., Disp: 30 mL, Rfl: 5   trolamine salicylate (ASPERCREME) 10 % cream, Apply 1 application  topically as needed for muscle pain., Disp: , Rfl:    estradiol  (ESTRACE ) 0.1 MG/GM vaginal cream, APPLY ONE GRAM VAGINALLY AT BEDTIME FOR 2 WEEKS THEN THREE TIMES WEEKLY THEREAFTER (Patient not taking: Reported on 01/19/2024), Disp: 42.5 g, Rfl: 11   misoprostol  (CYTOTEC ) 200 MCG tablet, Place two tablets in the vagina the night prior to your procedure, Disp: 2 tablet, Rfl: 0   OVER THE COUNTER MEDICATION, Apply 1 application  topically as needed (pain). Blue - Emu, Disp: , Rfl:    OVER THE COUNTER MEDICATION, Bio true, Disp: , Rfl:    traZODone  (DESYREL ) 50 MG tablet, Take 1 tablet (50 mg total) by mouth at bedtime as needed for sleep., Disp: 90 tablet, Rfl: 1  Review of Systems:  Negative unless indicated in HPI.   Physical Exam: Vitals:   01/19/24 1356  BP: 110/74  Pulse: 70  Temp: 98 F (36.7 C)  TempSrc: Oral  SpO2: 98%  Weight: 195 lb 8 oz (88.7 kg)  Height: 5' 5.5 (1.664 m)    Body mass index is 32.04 kg/m.   Physical Exam Vitals reviewed.  Constitutional:      Appearance: Normal appearance.  HENT:     Head: Normocephalic and atraumatic.   Eyes:     Conjunctiva/sclera: Conjunctivae normal.    Cardiovascular:     Rate and Rhythm:  Normal rate and regular rhythm.  Pulmonary:     Effort: Pulmonary effort is normal.     Breath sounds: Normal breath sounds.   Skin:    General: Skin is warm and dry.   Neurological:     General: No focal deficit present.     Mental Status: She is alert and oriented to person, place, and time.   Psychiatric:  Mood and Affect: Mood normal.        Behavior: Behavior normal.        Thought Content: Thought content normal.        Judgment: Judgment normal.      Impression and Plan:  GAD (generalized anxiety disorder)  Insomnia, unspecified type -     traZODone  HCl; Take 1 tablet (50 mg total) by mouth at bedtime as needed for sleep.  Dispense: 90 tablet; Refill: 1  Essential hypertension  Mixed hyperlipidemia  Aneurysm of ascending aorta without rupture (HCC)  Endometrial polyp   - Mood is stable. - Blood pressure is well-controlled on current. - She is on Repatha  for hyperlipidemia. - Ascending aortic aneurysm is followed by cardiology, had a recent echocardiogram. - Trazodone  refilled that she takes long-term for insomnia.  Time spent:32 minutes reviewing chart, interviewing and examining patient and formulating plan of care.     Tully Theophilus Andrews, MD [facility]

## 2024-01-21 NOTE — Telephone Encounter (Signed)
 Called and confirmed ID [id number]. Patient reports doing well. Was not sure if she was able to resume walking, noted it was ok to resume walking. Reviewed benign pathology confirming endometrial polyp. Patient reports doing well. Reviewed that if recovering well, no additional follow up needed

## 2024-02-05 ENCOUNTER — Other Ambulatory Visit (HOSPITAL_COMMUNITY): Payer: Self-pay

## 2024-02-05 ENCOUNTER — Ambulatory Visit: Attending: Cardiology | Admitting: Pharmacist

## 2024-02-05 ENCOUNTER — Other Ambulatory Visit: Payer: Self-pay | Admitting: Primary Care

## 2024-02-05 DIAGNOSIS — E782 Mixed hyperlipidemia: Secondary | ICD-10-CM | POA: Diagnosis not present

## 2024-02-05 DIAGNOSIS — F411 Generalized anxiety disorder: Secondary | ICD-10-CM

## 2024-02-05 DIAGNOSIS — I10 Essential (primary) hypertension: Secondary | ICD-10-CM

## 2024-02-05 DIAGNOSIS — K2100 Gastro-esophageal reflux disease with esophagitis, without bleeding: Secondary | ICD-10-CM

## 2024-02-05 MED ORDER — EZETIMIBE 10 MG PO TABS
10.0000 mg | ORAL_TABLET | Freq: Every day | ORAL | 3 refills | Status: DC
  Filled 2024-02-05: qty 90, 90d supply, fill #0

## 2024-02-05 NOTE — Patient Instructions (Signed)
 Please start taking ezetimibe  10mg  daily Finish out your Repatha  shots We will start working on the prior authorization for Nexlizet (Nexletol plus ezetimibe ) in the beginning of Sept Please call me at [phone] with any questions

## 2024-02-05 NOTE — Progress Notes (Signed)
 Patient ID: Marie Ruiz                 DOB: Aug 25, 1945                    MRN: [id number]      HPI: Marie Ruiz is a 78 y.o. female patient referred to lipid clinic by Dr. Loni. PMH is significant for aortic aneurysm, prior positive lupus anticoagulant with normalization off of anticoagulation, DVT felt to be provoked 10/2017, hypertension, hyperlipidemia, and prediabetes. She had a normal CTA in 10/22.   Patient was seen by Pharm.D. clinic in 2022 and started on Praluent .  She had little improvement in LDL-C on 75 mg of Praluent  and was increased to 150mg .  Better reduction but not as expected.  In 2024 she was transitioned to Repatha  due to insurance preference.  LDL-C in May 2025 was 114 on Repatha .    Patient presents today to lipid clinic to discuss options.  She reports compliance with Repatha  taking every 14 days.  Reports feeling to prick the needle and view finder turning yellow.  We discussed changing medication therapy to see if we get better results.  Discussed Nexletol and Nexlizet.  She still has 4 shots of Repatha  and she does not want to waste them.  Current Medications: Repatha  140mg   Intolerances: Atorvastatin  Rosuvastatin  at doses >5mg  3x weekly Risk Factors: HTN, age, aortic aneurysm LDL-C goal: <70 ApoB goal: <80  Diet:  Breakfast Low fat cottage cheese with fruit w/ english muffin w/ honey White meat Some red meat Lunch: [place] sandwich w/ mustard, few grapes, few chips Dinner: salmon and salad, chicken and vegetable Drink: water, crangrape Snack: thin cookies, vanilla wafers, sherbert  Exercise: just recently had surgery and now can get back to walking  Family History: father- died of MI 20, mom lived to be 19  Social History: no tobacco, 1 drink per week  Labs: Lipid Panel     Component Value Date/Time   CHOL 216 (H) 12/05/2023 1006   TRIG 143 12/05/2023 1006   HDL 77 12/05/2023 1006   CHOLHDL 2.8 12/05/2023 1006   CHOLHDL 3  09/04/2023 1508   VLDL 32.4 09/04/2023 1508   LDLCALC 114 (H) 12/05/2023 1006   LABVLDL 25 12/05/2023 1006    Past Medical History:  Diagnosis Date   Acute diarrhea 01/28/2023   Allergy    seasonal   Anxiety    Aortic aneurysm (HCC) 08/2018   Arthritis    Bilateral hearing loss 03/12/2023   does not wear hearing aids   Blood clotting disorder (HCC)    Bone spur    with excision   Chickenpox    Colon polyps    DVT (deep venous thrombosis) (HCC)    GERD (gastroesophageal reflux disease)    Glaucoma    both   Graves disease 03/30/2013   History of blood clots 10/2017   Hyperlipemia    Hypertension    Hyperthyroidism    no current problems per pt on 01/12/24   Osteopenia    Prediabetes    no meds, does not check blood sugar   Restless leg syndrome     Current Outpatient Medications on File Prior to Visit  Medication Sig Dispense Refill   acetaminophen  (TYLENOL ) 500 MG tablet Take 500 mg by mouth as needed for mild pain.     ALPHAGAN P 0.1 % SOLN Place 1 drop into both eyes 2 (two) times daily.  b complex vitamins capsule Take 1 capsule by mouth daily.     Calcium  Carb-Cholecalciferol (CALCIUM  + VITAMIN D3 PO) Take 1 tablet by mouth daily.     cetirizine  (ZYRTEC ) 10 MG tablet Take 1 tablet (10 mg total) by mouth daily. 30 tablet 11   cholecalciferol (VITAMIN D) 1000 units tablet Take 1,000 Units by mouth daily.     cyanocobalamin (VITAMIN B12) 1000 MCG tablet Take 1,000 mcg by mouth daily.     dorzolamide-timolol (COSOPT) 22.3-6.8 MG/ML ophthalmic solution Place 1 drop into both eyes 2 (two) times daily.     Evolocumab  (REPATHA  SURECLICK) 140 MG/ML SOAJ Inject 140 mg into the skin every 14 (fourteen) days. 6 mL 3   FLUoxetine  (PROZAC ) 20 MG capsule TAKE 1 CAPSULE BY MOUTH DAILY. FOR ANXIETY 90 capsule 0   fluticasone  (FLONASE ) 50 MCG/ACT nasal spray Place 2 sprays into both nostrils daily. 16 g 6   MAGNESIUM PO Take by mouth.     Multiple Vitamin (MULTIVITAMIN) tablet  Take 1 tablet by mouth daily.     OVER THE COUNTER MEDICATION Apply 1 application  topically as needed (pain). Blue - Emu     OVER THE COUNTER MEDICATION Bio true     pantoprazole  (PROTONIX ) 20 MG tablet TAKE 1 TABLET BY MOUTH 2 TIMES DAILY BEFORE A MEAL FOR HEARTBURN. 180 tablet 2   sodium chloride  (OCEAN) 0.65 % SOLN nasal spray Place 1 spray into both nostrils as needed. 30 mL 5   traZODone  (DESYREL ) 50 MG tablet Take 1 tablet (50 mg total) by mouth at bedtime as needed for sleep. 90 tablet 1   trolamine salicylate (ASPERCREME) 10 % cream Apply 1 application  topically as needed for muscle pain.     No current facility-administered medications on file prior to visit.    Allergies  Allergen Reactions   Amitriptyline Other (See Comments)    hallucinations   Codeine    Crestor  [Rosuvastatin ]     Myalgias    Lipitor [Atorvastatin ]     Myaglias    Prednisone     Depression    Assessment/Plan:  1. Hyperlipidemia -  Hyperlipidemia Assessment: LDL-C above goal less than 70 on Repatha  Patient had lack of response to PCSK9 We discussed adding Nexlizet-she still has 4 doses of Repatha  left that she wants to use Given that patient has had some intolerances in the past would be a little hesitant to start both ezetimibe  and Nexletol at the same time Had a pause in exercise but patient now will resume walking  Plan: Start ezetimibe  10 mg daily now Continue Repatha  until injections complete-last injection due September 8 We will work on prior authorization for [employer] in early September    Thank you,  Eleanor JONETTA Crews, Pharm.JONETTA SARAN, CPP [facility] [address] [place], [place] [address]  Phone: [phone]; Fax: [phone]

## 2024-02-05 NOTE — Assessment & Plan Note (Signed)
 Assessment: LDL-C above goal less than 70 on Repatha  Patient had lack of response to PCSK9 We discussed adding Nexlizet-she still has 4 doses of Repatha  left that she wants to use Given that patient has had some intolerances in the past would be a little hesitant to start both ezetimibe  and Nexletol at the same time Had a pause in exercise but patient now will resume walking  Plan: Start ezetimibe  10 mg daily now Continue Repatha  until injections complete-last injection due September 8 We will work on prior authorization for [employer] in early September

## 2024-02-20 ENCOUNTER — Telehealth (INDEPENDENT_AMBULATORY_CARE_PROVIDER_SITE_OTHER): Payer: Self-pay | Admitting: Otolaryngology

## 2024-02-20 NOTE — Telephone Encounter (Signed)
 Patient's last appt with ENT was December 2024.  Last Hearing Eval was done at [facility] August 2024.  Patient is scheduled to see Dr. Tiney for a 1 year Hearing Eval on 02/23/2024.  Please put in an order for this appointment.  Thank you.

## 2024-02-23 ENCOUNTER — Ambulatory Visit (INDEPENDENT_AMBULATORY_CARE_PROVIDER_SITE_OTHER): Payer: Medicare Other | Admitting: Audiology

## 2024-02-23 ENCOUNTER — Other Ambulatory Visit (INDEPENDENT_AMBULATORY_CARE_PROVIDER_SITE_OTHER): Payer: Self-pay | Admitting: Otolaryngology

## 2024-02-23 DIAGNOSIS — H903 Sensorineural hearing loss, bilateral: Secondary | ICD-10-CM

## 2024-02-23 DIAGNOSIS — H9311 Tinnitus, right ear: Secondary | ICD-10-CM

## 2024-02-23 NOTE — Progress Notes (Signed)
  [address], [address] [place], [place] [address] [phone]  Audiological Evaluation    Name: Marie Ruiz     DOB:   12/03/1945      MRN:   [id number]                                                                                     Service Date: 02/23/2024     Accompanied by: unaccompanied   Patient comes today after Dr. Soldatova, ENT sent a referral for a hearing evaluation due to concerns with hearing loss asymmetry.   Symptoms Yes Details  Hearing loss  [x]  03-12-2023: Speech Recognition Thresholds were 15dB in the right ear and 20dB in the left ear. Word Recognition was performed 40dB SL, scored 100% in the right ear and 92% in the left ear. 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 See audiogram below.   Tinnitus  []    Ear pain/ infections/pressure  []    Balance problems  [x]  Lately has started to notice gets lightheaded when changes position from sitting to standing or when climbing stairs.  Noise exposure history  []    Previous ear surgeries  []    Family history of hearing loss  []    Amplification  []    Other  []      Otoscopy: Right ear: Clear external ear canal and notable landmarks visualized on the tympanic membrane. Left ear:  Clear external ear canal and notable landmarks visualized on the tympanic membrane.  Tympanometry: Right ear: Type A- Normal external ear canal volume with normal middle ear pressure and tympanic membrane compliance. Left ear: Type A- Normal external ear canal volume with normal middle ear pressure and tympanic membrane compliance.   Pure tone Audiometry: Right ear- Normal to moderate sensorineural hearing loss from 250 Hz - 8000 Hz. Left ear-  Normal to moderately severe sensorineural hearing loss from 250 Hz - 8000 Hz.  Left hearing asymmetry continues to be observed. Today's results seem stable when compared to audiogram from August 2024.  Speech Audiometry: Right ear- Speech  Reception Threshold (SRT) was obtained at 15 dBHL. Left ear-Speech Reception Threshold (SRT) was obtained at 15 dBHL.   Word Recognition Score Tested using NU-6 (recorded) Right ear: 100% was obtained at a presentation level of 60 dBHL with contralateral masking which is deemed as  excellent. Left ear: 100% was obtained at a presentation level of 60 dBHL with contralateral masking which is deemed as  excellent.   The hearing test results were completed under headphones and results are deemed to be of good reliability. Test technique:  conventional      Recommendations: Follow up with ENT as per MD. Return for a hearing evaluation in one year, before if concerns with hearing changes arise or per MD recommendation. Recommend hearing aid consult after medical clearance.   Jemarion Roycroft MARIE LEROUX-MARTINEZ, AUD

## 2024-02-25 DIAGNOSIS — L538 Other specified erythematous conditions: Secondary | ICD-10-CM | POA: Diagnosis not present

## 2024-02-25 DIAGNOSIS — D2272 Melanocytic nevi of left lower limb, including hip: Secondary | ICD-10-CM | POA: Diagnosis not present

## 2024-02-25 DIAGNOSIS — L2989 Other pruritus: Secondary | ICD-10-CM | POA: Diagnosis not present

## 2024-02-25 DIAGNOSIS — L820 Inflamed seborrheic keratosis: Secondary | ICD-10-CM | POA: Diagnosis not present

## 2024-02-25 DIAGNOSIS — D492 Neoplasm of unspecified behavior of bone, soft tissue, and skin: Secondary | ICD-10-CM | POA: Diagnosis not present

## 2024-03-01 ENCOUNTER — Encounter: Payer: Self-pay | Admitting: Audiology

## 2024-03-26 ENCOUNTER — Telehealth: Payer: Self-pay

## 2024-03-26 ENCOUNTER — Other Ambulatory Visit (HOSPITAL_COMMUNITY): Payer: Self-pay

## 2024-03-26 DIAGNOSIS — E785 Hyperlipidemia, unspecified: Secondary | ICD-10-CM

## 2024-03-26 NOTE — Telephone Encounter (Signed)
-----   Message from Melissa D Maccia sent at 03/26/2024 12:00 PM EDT ----- Please do PA for Nexlizet ----- Message ----- From: Maccia, Melissa D, RPH-CPP Sent: 03/22/2024  12:00 AM EDT To: Eleanor JONETTA Crews, RPH-CPP  PA for Nexlizet

## 2024-03-29 ENCOUNTER — Other Ambulatory Visit (HOSPITAL_COMMUNITY): Payer: Self-pay

## 2024-03-29 DIAGNOSIS — H16223 Keratoconjunctivitis sicca, not specified as Sjogren's, bilateral: Secondary | ICD-10-CM | POA: Diagnosis not present

## 2024-03-29 DIAGNOSIS — Z961 Presence of intraocular lens: Secondary | ICD-10-CM | POA: Diagnosis not present

## 2024-03-29 DIAGNOSIS — H04123 Dry eye syndrome of bilateral lacrimal glands: Secondary | ICD-10-CM | POA: Diagnosis not present

## 2024-03-29 DIAGNOSIS — H401133 Primary open-angle glaucoma, bilateral, severe stage: Secondary | ICD-10-CM | POA: Diagnosis not present

## 2024-03-29 NOTE — Telephone Encounter (Signed)
 Pharmacy Patient Advocate Encounter  Received notification from [facility] that Prior Authorization for NEXLIZET  has been APPROVED from 03/26/24 to 09/23/24. Ran test claim, Copay is $47. This test claim was processed through [facility]- copay amounts may vary at other pharmacies due to pharmacy/plan contracts, or as the patient moves through the different stages of their insurance plan.

## 2024-03-30 MED ORDER — NEXLIZET 180-10 MG PO TABS: 1.0000 | ORAL_TABLET | Freq: Every day | ORAL | 11 refills | Status: DC

## 2024-03-30 NOTE — Addendum Note (Signed)
 Addended by: Chaise Mahabir D on: 03/30/2024 09:19 AM   Modules accepted: Orders

## 2024-03-30 NOTE — Telephone Encounter (Signed)
 Patient made aware of approval. Requested Rx to [facility]. Labs in 3 months.  She is stopping Reptha due to lack of efficacy and will stop zetia  as well

## 2024-04-02 ENCOUNTER — Other Ambulatory Visit (HOSPITAL_COMMUNITY): Payer: Self-pay

## 2024-04-02 MED ORDER — NEXLIZET 180-10 MG PO TABS
1.0000 | ORAL_TABLET | Freq: Every day | ORAL | 11 refills | Status: AC
  Filled 2024-04-02: qty 30, 30d supply, fill #0
  Filled 2024-04-27: qty 30, 30d supply, fill #1
  Filled 2024-04-27: qty 90, 90d supply, fill #1
  Filled 2024-07-28: qty 30, 30d supply, fill #2
  Filled 2024-07-29: qty 90, 90d supply, fill #2
  Filled 2024-07-29: qty 30, 30d supply, fill #2
  Filled 2024-07-29 – 2024-07-30 (×3): qty 90, 90d supply, fill #2

## 2024-04-02 NOTE — Telephone Encounter (Signed)
 Per pt [facility] said it hasn't been approved. Our test claim went though. Rx sent to [facility] and pt will pick up today.

## 2024-04-02 NOTE — Addendum Note (Signed)
 Addended by: Cobain Morici D on: 04/02/2024 01:20 PM   Modules accepted: Orders

## 2024-04-08 ENCOUNTER — Other Ambulatory Visit: Payer: Self-pay | Admitting: Internal Medicine

## 2024-04-09 ENCOUNTER — Other Ambulatory Visit: Payer: Self-pay | Admitting: Internal Medicine

## 2024-04-09 ENCOUNTER — Telehealth: Payer: Self-pay | Admitting: *Deleted

## 2024-04-09 DIAGNOSIS — F411 Generalized anxiety disorder: Secondary | ICD-10-CM

## 2024-04-09 NOTE — Telephone Encounter (Signed)
 Copied from CRM #[id number]. Topic: Clinical - Medication Question >> Apr 09, 2024  3:01 PM Aisha D wrote: Reason for CRM: Pt stated that she spoke with [facility] and they stated that she needed an appt before she could have the FLUoxetine  (PROZAC ) 20 MG capsule refilled. Pt stated that she has never had to have an appt for this medication before and has seen Dr.Hernandez within 6 months. Pt state that she is currently out of the medication and needs this to be refilled today because she can't be off of this medication for long. Pt would like a callback today with an update.

## 2024-04-12 NOTE — Telephone Encounter (Signed)
 Refill was sent

## 2024-04-15 ENCOUNTER — Telehealth: Payer: Self-pay | Admitting: Pharmacist

## 2024-04-15 NOTE — Telephone Encounter (Signed)
 Patient called 9/23 and LVM that she thinks the Nexlizet  is too strong. She is having discomfort behind her knees. Requested a call back to discuss.

## 2024-04-27 ENCOUNTER — Other Ambulatory Visit (HOSPITAL_COMMUNITY): Payer: Self-pay

## 2024-04-28 DIAGNOSIS — L814 Other melanin hyperpigmentation: Secondary | ICD-10-CM | POA: Diagnosis not present

## 2024-04-28 DIAGNOSIS — L2989 Other pruritus: Secondary | ICD-10-CM | POA: Diagnosis not present

## 2024-04-28 DIAGNOSIS — L821 Other seborrheic keratosis: Secondary | ICD-10-CM | POA: Diagnosis not present

## 2024-04-28 DIAGNOSIS — L820 Inflamed seborrheic keratosis: Secondary | ICD-10-CM | POA: Diagnosis not present

## 2024-04-28 DIAGNOSIS — L538 Other specified erythematous conditions: Secondary | ICD-10-CM | POA: Diagnosis not present

## 2024-04-28 DIAGNOSIS — D1801 Hemangioma of skin and subcutaneous tissue: Secondary | ICD-10-CM | POA: Diagnosis not present

## 2024-06-05 ENCOUNTER — Encounter (HOSPITAL_COMMUNITY): Payer: Self-pay

## 2024-06-05 ENCOUNTER — Ambulatory Visit (HOSPITAL_COMMUNITY)
Admission: EM | Admit: 2024-06-05 | Discharge: 2024-06-05 | Disposition: A | Discharge status: Home / Self Care | Attending: Family Medicine | Admitting: Family Medicine

## 2024-06-05 DIAGNOSIS — R070 Pain in throat: Secondary | ICD-10-CM | POA: Diagnosis present

## 2024-06-05 DIAGNOSIS — J069 Acute upper respiratory infection, unspecified: Secondary | ICD-10-CM | POA: Insufficient documentation

## 2024-06-05 LAB — POC COVID19/FLU A&B COMBO
Covid Antigen, POC: NEGATIVE
Influenza A Antigen, POC: NEGATIVE
Influenza B Antigen, POC: NEGATIVE

## 2024-06-05 LAB — POCT RAPID STREP A (OFFICE): Rapid Strep A Screen: NEGATIVE

## 2024-06-05 MED ORDER — PROMETHAZINE-DM 6.25-15 MG/5ML PO SYRP: 5.0000 mL | ORAL_SOLUTION | Freq: Four times a day (QID) | ORAL | 0 refills | Status: AC | PRN

## 2024-06-05 NOTE — ED Provider Notes (Signed)
 [facility]    CSN: [id number] Arrival date & time: 06/05/24  1157      History   Chief Complaint Chief Complaint  Patient presents with   Cough   Sore Throat    HPI Marie Ruiz is a 78 y.o. female.    Cough Sore Throat  Here for sore throat and cough and congestion. Symptoms began on the evening of 11/13. Had not noted any fever or chills at home. Throat feels tight. No n/v/d.  No h/o asthma.  She is allergic to prednisone, codeine, crestor , lipitor, amitriptyline.  She has been exposed to her husband who had similar symptoms, though his cough has seemed worse to her.  His covid test was negative; she has not done a covid test on herself.  Past Medical History:  Diagnosis Date   Acute diarrhea 01/28/2023   Allergy    seasonal   Anxiety    Aortic aneurysm 08/2018   Arthritis    Bilateral hearing loss 03/12/2023   does not wear hearing aids   Blood clotting disorder    Bone spur    with excision   Chickenpox    Colon polyps    DVT (deep venous thrombosis) (HCC)    GERD (gastroesophageal reflux disease)    Glaucoma    both   Graves disease 03/30/2013   History of blood clots 10/2017   Hyperlipemia    Hypertension    Hyperthyroidism    no current problems per pt on 01/12/24   Osteopenia    Prediabetes    no meds, does not check blood sugar   Restless leg syndrome     Patient Active Problem List   Diagnosis Date Noted   Endometrial polyp 01/13/2024   Thickened endometrium 01/13/2024   Chronic pelvic pain in female 09/04/2023   Chronic pain of inguinal region 09/04/2023   Gastroesophageal reflux disease with esophagitis without hemorrhage 01/28/2023   Popliteal pain 01/28/2023   Urinary urgency 01/28/2023   Chronic pain of both hips 02/20/2022   Frequent UTI 12/27/2021   Aortic atherosclerosis 11/28/2021   Preventative health care 02/14/2021   Heel pain, bilateral 08/14/2020   Ascending aortic aneurysm 02/10/2020   Insomnia  11/22/2019   Osteopenia 02/01/2019   Multiple nevi 12/22/2018   GAD (generalized anxiety disorder) 12/23/2017   Essential hypertension 12/23/2017   Hyperlipidemia 12/23/2017   Graves disease 12/23/2017   Glaucoma 12/23/2017   Osteoarthritis 12/23/2017    Past Surgical History:  Procedure Laterality Date   BREAST CYST ASPIRATION     CATARACT EXTRACTION W/ INTRAOCULAR LENS IMPLANT Right 2015   CATARACT EXTRACTION W/PHACO Left 2017   COLONOSCOPY  2014   DILATION AND CURETTAGE OF UTERUS     HYSTEROSCOPY WITH D & C N/A 01/13/2024   Procedure: DILATATION AND CURETTAGE /HYSTEROSCOPY;  Surgeon: Jeralyn Crutch, MD;  Location: [facility];  Service: Gynecology;  Laterality: N/A;  Hysteroscopy/Polyp resect/D&C   MYOSURE RESECTION  01/13/2024   Procedure: MELINDA RESECTION;  Surgeon: Jeralyn Crutch, MD;  Location: [facility];  Service: Gynecology;;    OB History   No obstetric history on file.      Home Medications    Prior to Admission medications   Medication Sig Start Date End Date Taking? Authorizing Provider  promethazine-dextromethorphan (PROMETHAZINE-DM) 6.25-15 MG/5ML syrup Take 5 mLs by mouth 4 (four) times daily as needed for cough. 06/05/24  Yes Vonna Sharlet POUR, MD  acetaminophen  (TYLENOL ) 500 MG tablet Take 500 mg by mouth as needed  for mild pain.    [provider]  ALPHAGAN P 0.1 % SOLN Place 1 drop into both eyes 2 (two) times daily. 01/12/24   [provider]  amLODipine -benazepril  (LOTREL) 5-20 MG capsule TAKE 1 CAPSULE BY MOUTH DAILY. FOR BLOOD PRESSURE. 02/05/24   Theophilus Andrews, Tully GRADE, MD  b complex vitamins capsule Take 1 capsule by mouth daily.    [provider]  Bempedoic Acid -Ezetimibe  (NEXLIZET ) 180-10 MG TABS Take 1 tablet by mouth daily. 04/02/24   Acharya, Gayatri A, MD  Calcium  Carb-Cholecalciferol (CALCIUM  + VITAMIN D3 PO) Take 1 tablet by mouth daily.    [provider]  cetirizine  (ZYRTEC ) 10 MG tablet Take 1 tablet (10  mg total) by mouth daily. 06/23/23   Soldatova, Liuba, MD  cholecalciferol (VITAMIN D) 1000 units tablet Take 1,000 Units by mouth daily.    [provider]  cyanocobalamin (VITAMIN B12) 1000 MCG tablet Take 1,000 mcg by mouth daily.    [provider]  dorzolamide-timolol (COSOPT) 22.3-6.8 MG/ML ophthalmic solution Place 1 drop into both eyes 2 (two) times daily.    [provider]  FLUoxetine  (PROZAC ) 20 MG capsule TAKE 1 CAPSULE BY MOUTH DAILY FOR ANXIETY 04/12/24   Theophilus Andrews, Tully GRADE, MD  fluticasone  (FLONASE ) 50 MCG/ACT nasal spray Place 2 sprays into both nostrils daily. 06/23/23   Soldatova, Liuba, MD  MAGNESIUM PO Take by mouth.    [provider]  Multiple Vitamin (MULTIVITAMIN) tablet Take 1 tablet by mouth daily.    [provider]  OVER THE COUNTER MEDICATION Apply 1 application  topically as needed (pain). Blue - Emu    [provider]  OVER THE COUNTER MEDICATION Bio true    [provider]  pantoprazole  (PROTONIX ) 20 MG tablet TAKE 1 TABLET BY MOUTH 2 TIMES DAILY BEFORE A MEAL FOR HEARTBURN. 02/09/24   Theophilus Andrews, Tully GRADE, MD  sodium chloride  (OCEAN) 0.65 % SOLN nasal spray Place 1 spray into both nostrils as needed. 06/23/23   Soldatova, Liuba, MD  traZODone  (DESYREL ) 50 MG tablet Take 1 tablet (50 mg total) by mouth at bedtime as needed for sleep. 01/19/24   Theophilus Andrews, Tully GRADE, MD  trolamine salicylate (ASPERCREME) 10 % cream Apply 1 application  topically as needed for muscle pain.    [provider]    Family History Family History  Problem Relation Age of Onset   Heart disease Mother    Heart attack Father    Breast cancer Sister 89   Colon polyps Sister    Aneurysm Brother    Multiple myeloma Sister        found in stage 4   Breast cancer Maternal Grandmother    Esophageal cancer Maternal Aunt 82   Colon cancer Neg Hx    Rectal cancer Neg Hx    Stomach cancer Neg Hx      Social History Social History   Tobacco Use   Smoking status: Former    Current packs/day: 0.00    Types: Cigarettes    Quit date: 1990    Years since quitting: 35.8   Smokeless tobacco: Never  Vaping Use   Vaping status: Never Used  Substance Use Topics   Alcohol use: Yes    Alcohol/week: 2.0 standard drinks of alcohol    Types: 2 Standard drinks or equivalent per week   Drug use: Not Currently     Allergies   Amitriptyline, Codeine, Crestor  [rosuvastatin ], Lipitor [email or web address] ], and Prednisone  Review of Systems Review of Systems  Respiratory:  Positive for cough.      Physical Exam Triage Vital Signs ED Triage Vitals [06/05/24 1329]  Encounter Vitals Group     BP 115/77     Girls Systolic BP Percentile      Girls Diastolic BP Percentile      Boys Systolic BP Percentile      Boys Diastolic BP Percentile      Pulse Rate 64     Resp 18     Temp 99 F (37.2 C)     Temp Source Oral     SpO2 95 %     Weight      Height      Head Circumference      Peak Flow      Pain Score      Pain Loc      Pain Education      Exclude from Growth Chart    No data found.  Updated Vital Signs BP 115/77 (BP Location: Left Arm)   Pulse 64   Temp 99 F (37.2 C) (Oral)   Resp 18   SpO2 95%   Visual Acuity Right Eye Distance:   Left Eye Distance:   Bilateral Distance:    Right Eye Near:   Left Eye Near:    Bilateral Near:     Physical Exam Vitals reviewed.  Constitutional:      General: She is not in acute distress.    Appearance: She is not toxic-appearing.  HENT:     Right Ear: Tympanic membrane and ear canal normal.     Left Ear: Tympanic membrane and ear canal normal.     Nose: Nose normal.     Mouth/Throat:     Mouth: Mucous membranes are moist.     Comments: There is some mild erythema of the posterior OP with some clear exudate draining. No tonsillar hypertrophy Eyes:     Extraocular Movements: Extraocular movements intact.      Conjunctiva/sclera: Conjunctivae normal.     Pupils: Pupils are equal, round, and reactive to light.  Cardiovascular:     Rate and Rhythm: Normal rate and regular rhythm.     Heart sounds: No murmur heard. Pulmonary:     Effort: Pulmonary effort is normal. No respiratory distress.     Breath sounds: No stridor. No wheezing, rhonchi or rales.  Musculoskeletal:     Cervical back: Neck supple.  Lymphadenopathy:     Cervical: No cervical adenopathy.  Skin:    Capillary Refill: Capillary refill takes less than 2 seconds.     Coloration: Skin is not jaundiced or pale.  Neurological:     General: No focal deficit present.     Mental Status: She is alert and oriented to person, place, and time.  Psychiatric:        Behavior: Behavior normal.      UC Treatments / Results  Labs (all labs ordered are listed, but only abnormal results are displayed) Labs Reviewed  CULTURE, GROUP A STREP [facility])  POCT RAPID STREP A (OFFICE)  POC COVID19/FLU A&B COMBO    EKG   Radiology No results found.  Procedures Procedures (including critical care time)  Medications Ordered in UC Medications - No data to display  Initial Impression / Assessment and Plan / UC Course  I have reviewed the triage vital signs and the nursing notes.  Pertinent labs & imaging results that were available during my care of the  patient were reviewed by me and considered in my medical decision making (see chart for details).     She requests a Zpack for her symptoms. I have discussed with her that the most likely cause of her symptoms is viral.  Testing for flu and COVID are negative. Rapid strep is negative.  Throat culture is sent and we will notify and treat protocol if that is positive  Promethazine DM was sent in for cough.  Final Clinical Impressions(s) / UC Diagnoses   Final diagnoses:  Viral URI  Throat pain     Discharge Instructions      The testing for flu and COVID was negative.  Your  strep test is negative.  Culture of the throat will be sent, and staff will notify you if that is in turn positive.  Most likely this is some other virus causing your symptoms.  Take Phenergan with dextromethorphan syrup--5 mL or 1 teaspoon every 6 hours as needed for cough  You can also take tylenol  as needed for any pain or fever.  Make sure you are getting plenty of oral fluids in.       ED Prescriptions     Medication Sig Dispense Auth. Provider   promethazine-dextromethorphan (PROMETHAZINE-DM) 6.25-15 MG/5ML syrup Take 5 mLs by mouth 4 (four) times daily as needed for cough. 118 mL Vonna Sharlet POUR, MD      PDMP not reviewed this encounter.   Vonna Sharlet POUR, MD 06/05/24 1455

## 2024-06-05 NOTE — Discharge Instructions (Signed)
 The testing for flu and COVID was negative.  Your strep test is negative.  Culture of the throat will be sent, and staff will notify you if that is in turn positive.  Most likely this is some other virus causing your symptoms.  Take Phenergan with dextromethorphan syrup--5 mL or 1 teaspoon every 6 hours as needed for cough  You can also take tylenol  as needed for any pain or fever.  Make sure you are getting plenty of oral fluids in.

## 2024-06-05 NOTE — ED Triage Notes (Signed)
 Patient presents to the office for cough and sore throat x 3 days. Patient denies any N/V/D.

## 2024-06-08 LAB — CULTURE, GROUP A STREP (THRC)

## 2024-06-09 ENCOUNTER — Ambulatory Visit (HOSPITAL_COMMUNITY): Payer: Self-pay

## 2024-06-10 ENCOUNTER — Ambulatory Visit: Payer: Self-pay | Admitting: Internal Medicine

## 2024-06-10 NOTE — Telephone Encounter (Signed)
 FYI Only or Action Required?: Action required by provider: medication refill request.  Patient was last seen in primary care on 01/19/2024 by Theophilus Andrews, Tully GRADE, MD.  Called Nurse Triage reporting Cough.  Symptoms began a week ago.  Interventions attempted: OTC medications: coricidin and Prescription medications: promethazine cough syrup.  Symptoms are: gradually worsening.  Triage Disposition: See Physician Within 24 Hours  Patient/caregiver understands and will follow disposition?: No, wishes to speak with PCP      Patient is req a call abck with a status of this req. Reason for Disposition  [1] Continuous (nonstop) coughing interferes with work or school AND [2] no improvement using cough treatment per Care Advice  Answer Assessment - Initial Assessment Questions Pt states she felt like she was getting better taking that cough medicine. She states that last night was the first night without it and it was rough. She states she feels like her cough sounds different. She states before it was a dry cough and now it sounds more congested. She states she did notice some wheezing last night but none today. Denies any fever, chest pain or shortness of breath. RN advised pt should be seen since it is starting to sound different. Pt declined. She would like the medication refilled and at this time will try care advise RN gave. Rn advised doctor may want her to be seen before prescribing her a medication. She stated understanding but still declined appt for tomorrow that was offered.     1. ONSET: When did the cough begin?      About a week ago 2. SEVERITY: How bad is the cough today?      Worse last night 3. SPUTUM: Describe the color of your sputum (e.g., none, dry cough; clear, white, yellow, green)     none 4. HEMOPTYSIS: Are you coughing up any blood? If Yes, ask: How much? (e.g., flecks, streaks, tablespoons, etc.)     no 5. DIFFICULTY BREATHING: Are you having  difficulty breathing? If Yes, ask: How bad is it? (e.g., mild, moderate, severe)      denies 6. FEVER: Do you have a fever? If Yes, ask: What is your temperature, how was it measured, and when did it start?     denies 7. CARDIAC HISTORY: Do you have any history of heart disease? (e.g., heart attack, congestive heart failure)      htn 8. LUNG HISTORY: Do you have any history of lung disease?  (e.g., pulmonary embolus, asthma, emphysema)     no 9. OTHER SYMPTOMS: Do you have any other symptoms? (e.g., runny nose, wheezing, chest pain)       Noticed some wheezing last night, none today  Protocols used: Cough - Acute Non-Productive-A-AH   Copied from CRM #[id number]. Topic: Clinical - Medication Question >> Jun 10, 2024  3:28 PM Shanda MATSU wrote: Reason for CRM: Patient calling in wanting to know if PCP can prescribe med, promethazine-dextromethorphan (PROMETHAZINE-DM) 6.25-15 MG/5ML syrup, patient stated she went to UC on this past Saturday for a cough, patient stated she has now finished the med and noticed that without the cough has returned to the point where she cannot sleep. Patient's preferred pharmacy is  [id number] - [place] - [address] AT CORNER OF [facility] [address] [place] [address] Phone: [phone] Fax: [phone]  Patient is req a call abck with a status of this req.

## 2024-06-29 LAB — LIPID PANEL

## 2024-06-30 ENCOUNTER — Ambulatory Visit: Payer: Self-pay | Admitting: Pharmacist

## 2024-06-30 LAB — LIPID PANEL
Cholesterol, Total: 152 mg/dL (ref 100–199)
HDL: 66 mg/dL (ref >39)
LDL CALC COMMENT:: 2.3 ratio (ref 0.0–4.4)
LDL Chol Calc (NIH): 66 mg/dL (ref 0–99)
Triglycerides: 115 mg/dL (ref 0–149)
VLDL Cholesterol Cal: 20 mg/dL (ref 5–40)

## 2024-06-30 LAB — COMPREHENSIVE METABOLIC PANEL WITH GFR
ALT: 24 IU/L (ref 0–32)
AST: 25 IU/L (ref 0–40)
Albumin: 4.6 g/dL (ref 3.8–4.8)
Alkaline Phosphatase: 58 IU/L (ref 49–135)
BUN/Creatinine Ratio: 19 (ref 12–28)
BUN: 13 mg/dL (ref 8–27)
Bilirubin Total: 0.5 mg/dL (ref 0.0–1.2)
CO2: 23 mmol/L (ref 20–29)
Calcium: 9.6 mg/dL (ref 8.7–10.3)
Chloride: 102 mmol/L (ref 96–106)
Creatinine, Ser: 0.7 mg/dL (ref 0.57–1.00)
Globulin, Total: 2.6 g/dL (ref 1.5–4.5)
Glucose: 88 mg/dL (ref 70–99)
Potassium: 4.4 mmol/L (ref 3.5–5.2)
Sodium: 140 mmol/L (ref 134–144)
Total Protein: 7.2 g/dL (ref 6.0–8.5)
eGFR: 88 mL/min/1.73 (ref >59)

## 2024-06-30 LAB — URIC ACID: Uric Acid: 5.8 mg/dL (ref 3.1–7.9)

## 2024-07-07 ENCOUNTER — Other Ambulatory Visit: Payer: Self-pay | Admitting: Internal Medicine

## 2024-07-07 DIAGNOSIS — F411 Generalized anxiety disorder: Secondary | ICD-10-CM

## 2024-07-07 MED ORDER — FLUOXETINE HCL 20 MG PO CAPS: 20.0000 mg | ORAL_CAPSULE | Freq: Every day | ORAL | 0 refills | Status: AC

## 2024-07-07 NOTE — Telephone Encounter (Signed)
 Copied from CRM [id number]. Topic: Clinical - Medication Refill >> Jul 07, 2024  9:50 AM Tanazia G wrote: Medication:  FLUoxetine  (PROZAC ) 20 MG capsule   Has the patient contacted their pharmacy? Yes (Agent: If no, request that the patient contact the pharmacy for the refill. If patient does not wish to contact the pharmacy document the reason why and proceed with request.) (Agent: If yes, when and what did the pharmacy advise?)  This is the patient's preferred pharmacy:  [id number] - [place], [place] - [address] AT [address]NER OF [facility] [address] [place] [address] Phone: [phone] Fax: [phone]  Is this the correct pharmacy for this prescription? Yes If no, delete pharmacy and type the correct one.   Has the prescription been filled recently? Yes  Is the patient out of the medication? Yes  Has the patient been seen for an appointment in the last year OR does the patient have an upcoming appointment? Yes  Can we respond through MyChart? Yes  Agent: Please be advised that Rx refills may take up to 3 business days. We ask that you follow-up with your pharmacy.

## 2024-07-12 ENCOUNTER — Encounter: Payer: Self-pay | Admitting: Internal Medicine

## 2024-07-12 ENCOUNTER — Ambulatory Visit: Admitting: Internal Medicine

## 2024-07-12 VITALS — BP 126/78 | HR 71 | Temp 98.2°F | Wt 194.3 lb

## 2024-07-12 DIAGNOSIS — I10 Essential (primary) hypertension: Secondary | ICD-10-CM | POA: Diagnosis not present

## 2024-07-12 DIAGNOSIS — E0500 Thyrotoxicosis with diffuse goiter without thyrotoxic crisis or storm: Secondary | ICD-10-CM | POA: Diagnosis not present

## 2024-07-12 DIAGNOSIS — E782 Mixed hyperlipidemia: Secondary | ICD-10-CM | POA: Diagnosis not present

## 2024-07-12 DIAGNOSIS — F411 Generalized anxiety disorder: Secondary | ICD-10-CM

## 2024-07-12 LAB — TSH: TSH: 1.46 u[IU]/mL (ref 0.35–5.50)

## 2024-07-12 NOTE — Progress Notes (Signed)
 "    Established Patient Office Visit     CC/Reason for Visit: Follow-up chronic conditions  HPI: Marie Ruiz is a 78 y.o. female who is coming in today for the above mentioned reasons. Past Medical History is significant for: Hypertension, hyperlipidemia, generalized anxiety disorder, GERD, insomnia.  History of Graves' disease not currently on medications.  She is requesting her TSH be checked.  She is otherwise feeling well.   Past Medical/Surgical History: Past Medical History:  Diagnosis Date   Acute diarrhea 01/28/2023   Allergy    seasonal   Anxiety    Aortic aneurysm 08/2018   Arthritis    Bilateral hearing loss 03/12/2023   does not wear hearing aids   Blood clotting disorder    Bone spur    with excision   Chickenpox    Colon polyps    DVT (deep venous thrombosis) (HCC)    GERD (gastroesophageal reflux disease)    Glaucoma    both   Graves disease 03/30/2013   History of blood clots 10/2017   Hyperlipemia    Hypertension    Hyperthyroidism    no current problems per pt on 01/12/24   Osteopenia    Prediabetes    no meds, does not check blood sugar   Restless leg syndrome     Past Surgical History:  Procedure Laterality Date   BREAST CYST ASPIRATION     CATARACT EXTRACTION W/ INTRAOCULAR LENS IMPLANT Right 2015   CATARACT EXTRACTION W/PHACO Left 2017   COLONOSCOPY  2014   DILATION AND CURETTAGE OF UTERUS     HYSTEROSCOPY WITH D & C N/A 01/13/2024   Procedure: DILATATION AND CURETTAGE /HYSTEROSCOPY;  Surgeon: Jeralyn Crutch, MD;  Location: [facility];  Service: Gynecology;  Laterality: N/A;  Hysteroscopy/Polyp resect/D&C   MYOSURE RESECTION  01/13/2024   Procedure: MELINDA RESECTION;  Surgeon: Jeralyn Crutch, MD;  Location: [facility];  Service: Gynecology;;    Social History:  reports that she quit smoking about 35 years ago. Her smoking use included cigarettes. She has never used smokeless tobacco. She reports current alcohol use of about 2.0  standard drinks of alcohol per week. She reports that she does not currently use drugs.  Allergies: Allergies[1]  Family History:  Family History  Problem Relation Age of Onset   Heart disease Mother    Heart attack Father    Breast cancer Sister 20   Colon polyps Sister    Aneurysm Brother    Multiple myeloma Sister        found in stage 4   Breast cancer Maternal Grandmother    Esophageal cancer Maternal Aunt 41   Colon cancer Neg Hx    Rectal cancer Neg Hx    Stomach cancer Neg Hx     Current Medications[2]  Review of Systems:  Negative unless indicated in HPI.   Physical Exam: Vitals:   07/12/24 1339  BP: 126/82  Pulse: 71  Temp: 98.2 F (36.8 C)  SpO2: 97%  Weight: 194 lb 4.8 oz (88.1 kg)    Body mass index is 31.84 kg/m.   Physical Exam Vitals reviewed.  Constitutional:      Appearance: Normal appearance.  HENT:     Head: Normocephalic and atraumatic.  Eyes:     Conjunctiva/sclera: Conjunctivae normal.  Cardiovascular:     Rate and Rhythm: Normal rate and regular rhythm.  Pulmonary:     Effort: Pulmonary effort is normal.     Breath sounds: Normal breath sounds.  Skin:    General: Skin is warm and dry.  Neurological:     General: No focal deficit present.     Mental Status: She is alert and oriented to person, place, and time.  Psychiatric:        Mood and Affect: Mood normal.        Behavior: Behavior normal.        Thought Content: Thought content normal.        Judgment: Judgment normal.      Impression and Plan:  Essential hypertension  GAD (generalized anxiety disorder)  Mixed hyperlipidemia  Graves disease -     TSH; Future  -Blood pressure is well-controlled on current. - Mood is stable on fluoxetine  20 mg. - Cholesterol is at goal. - Check TSH today.   Time spent:31 minutes reviewing chart, interviewing and examining patient and formulating plan of care.     Tully Theophilus Andrews, MD [facility]  [facility]     [1]  Allergies Allergen Reactions   Amitriptyline Other (See Comments)    hallucinations   Codeine    Crestor  [Rosuvastatin ]     Myalgias    Lipitor [Atorvastatin ]     Myaglias    Prednisone     Depression  [2]  Current Outpatient Medications:    acetaminophen  (TYLENOL ) 500 MG tablet, Take 500 mg by mouth as needed for mild pain., Disp: , Rfl:    ALPHAGAN P 0.1 % SOLN, Place 1 drop into both eyes 2 (two) times daily., Disp: , Rfl:    amLODipine -benazepril  (LOTREL) 5-20 MG capsule, TAKE 1 CAPSULE BY MOUTH DAILY. FOR BLOOD PRESSURE., Disp: 90 capsule, Rfl: 1   b complex vitamins capsule, Take 1 capsule by mouth daily., Disp: , Rfl:    Bempedoic Acid -Ezetimibe  (NEXLIZET ) 180-10 MG TABS, Take 1 tablet by mouth daily., Disp: 30 tablet, Rfl: 11   Calcium  Carb-Cholecalciferol (CALCIUM  + VITAMIN D3 PO), Take 1 tablet by mouth daily., Disp: , Rfl:    cetirizine  (ZYRTEC ) 10 MG tablet, Take 1 tablet (10 mg total) by mouth daily., Disp: 30 tablet, Rfl: 11   cholecalciferol (VITAMIN D) 1000 units tablet, Take 1,000 Units by mouth daily., Disp: , Rfl:    cyanocobalamin (VITAMIN B12) 1000 MCG tablet, Take 1,000 mcg by mouth daily., Disp: , Rfl:    dorzolamide-timolol (COSOPT) 22.3-6.8 MG/ML ophthalmic solution, Place 1 drop into both eyes 2 (two) times daily., Disp: , Rfl:    FLUoxetine  (PROZAC ) 20 MG capsule, Take 1 capsule (20 mg total) by mouth daily., Disp: 90 capsule, Rfl: 0   fluticasone  (FLONASE ) 50 MCG/ACT nasal spray, Place 2 sprays into both nostrils daily., Disp: 16 g, Rfl: 6   MAGNESIUM PO, Take by mouth., Disp: , Rfl:    Multiple Vitamin (MULTIVITAMIN) tablet, Take 1 tablet by mouth daily., Disp: , Rfl:    OVER THE COUNTER MEDICATION, Apply 1 application  topically as needed (pain). Blue - Emu, Disp: , Rfl:    OVER THE COUNTER MEDICATION, Bio true, Disp: , Rfl:    pantoprazole  (PROTONIX ) 20 MG tablet, TAKE 1 TABLET BY MOUTH 2 TIMES DAILY BEFORE A MEAL FOR HEARTBURN.,  Disp: 180 tablet, Rfl: 2   promethazine -dextromethorphan (PROMETHAZINE -DM) 6.25-15 MG/5ML syrup, Take 5 mLs by mouth 4 (four) times daily as needed for cough., Disp: 118 mL, Rfl: 0   sodium chloride  (OCEAN) 0.65 % SOLN nasal spray, Place 1 spray into both nostrils as needed., Disp: 30 mL, Rfl: 5   traZODone  (DESYREL ) 50  MG tablet, Take 1 tablet (50 mg total) by mouth at bedtime as needed for sleep., Disp: 90 tablet, Rfl: 1   trolamine salicylate (ASPERCREME) 10 % cream, Apply 1 application  topically as needed for muscle pain., Disp: , Rfl:   "

## 2024-07-13 ENCOUNTER — Ambulatory Visit: Payer: Self-pay | Admitting: Internal Medicine

## 2024-07-16 ENCOUNTER — Other Ambulatory Visit: Payer: Self-pay | Admitting: Internal Medicine

## 2024-07-16 DIAGNOSIS — I10 Essential (primary) hypertension: Secondary | ICD-10-CM

## 2024-07-20 ENCOUNTER — Ambulatory Visit: Admitting: Internal Medicine

## 2024-07-22 ENCOUNTER — Other Ambulatory Visit: Payer: Self-pay | Admitting: Internal Medicine

## 2024-07-22 DIAGNOSIS — G4700 Insomnia, unspecified: Secondary | ICD-10-CM

## 2024-07-23 ENCOUNTER — Other Ambulatory Visit (INDEPENDENT_AMBULATORY_CARE_PROVIDER_SITE_OTHER): Payer: Self-pay | Admitting: Otolaryngology

## 2024-07-28 ENCOUNTER — Telehealth (INDEPENDENT_AMBULATORY_CARE_PROVIDER_SITE_OTHER): Payer: Self-pay

## 2024-07-28 NOTE — Telephone Encounter (Signed)
 Per Dr. Roark I called patient to let them know we can not refill their medication at this time. I let patient know that Dr. Soldatova is no longer with our practice so they would need to get established with a new provider to get a refill. Patient understood and stated that she would call at a later time to get scheduled with a new provider.

## 2024-07-29 ENCOUNTER — Telehealth: Payer: Self-pay | Admitting: Internal Medicine

## 2024-07-29 ENCOUNTER — Other Ambulatory Visit (HOSPITAL_COMMUNITY): Payer: Self-pay

## 2024-07-29 NOTE — Telephone Encounter (Signed)
 Pt c/o medication issue:  1. Name of Medication:   Bempedoic Acid -Ezetimibe  (NEXLIZET ) 180-10 MG TABS   2. How are you currently taking this medication (dosage and times per day)?   As prescribed  3. Are you having a reaction (difficulty breathing--STAT)?   4. What is your medication issue?   Patient stated this medication is too expensive for her and she wants to get alternate medication.

## 2024-07-30 ENCOUNTER — Other Ambulatory Visit (HOSPITAL_COMMUNITY): Payer: Self-pay

## 2024-07-30 ENCOUNTER — Telehealth: Payer: Self-pay | Admitting: Pharmacy Technician

## 2024-07-30 NOTE — Telephone Encounter (Signed)
Already addressed in mychart

## 2024-07-30 NOTE — Telephone Encounter (Signed)
" ° °  Patient Advocate Encounter   The patient was approved for a [employer] grant that will help cover the cost of REPATHA  Total amount awarded, 2500.  Effective: 06/30/24 - 06/29/25   APW:[id number] ERW:[id number] Hmnle:[id number] PI:[id number] [employer] ID: [id number]   Pharmacy provided with approval and processing information. Patient informed via mychart  "

## 2024-08-04 ENCOUNTER — Ambulatory Visit (INDEPENDENT_AMBULATORY_CARE_PROVIDER_SITE_OTHER): Admitting: Internal Medicine

## 2024-08-04 ENCOUNTER — Encounter: Payer: Self-pay | Admitting: Internal Medicine

## 2024-08-04 VITALS — BP 110/70 | HR 67 | Temp 98.5°F | Ht 66.0 in | Wt 196.6 lb

## 2024-08-04 DIAGNOSIS — E782 Mixed hyperlipidemia: Secondary | ICD-10-CM

## 2024-08-04 DIAGNOSIS — Z0000 Encounter for general adult medical examination without abnormal findings: Secondary | ICD-10-CM

## 2024-08-04 DIAGNOSIS — K2100 Gastro-esophageal reflux disease with esophagitis, without bleeding: Secondary | ICD-10-CM

## 2024-08-04 DIAGNOSIS — E0500 Thyrotoxicosis with diffuse goiter without thyrotoxic crisis or storm: Secondary | ICD-10-CM

## 2024-08-04 DIAGNOSIS — H409 Unspecified glaucoma: Secondary | ICD-10-CM

## 2024-08-04 DIAGNOSIS — E2839 Other primary ovarian failure: Secondary | ICD-10-CM

## 2024-08-04 DIAGNOSIS — I10 Essential (primary) hypertension: Secondary | ICD-10-CM

## 2024-08-04 NOTE — Progress Notes (Signed)
 "    Established Patient Office Visit     CC/Reason for Visit: Annual preventive exam and subsequent Medicare wellness visit  HPI: Marie Ruiz is a 79 y.o. female who is coming in today for the above mentioned reasons.  Feeling well without major concerns or complaints.  Has routine eye and dental care.  Immunizations are up-to-date, cancer screening is up-to-date, DEXA is up-to-date.   Past Medical/Surgical History: Past Medical History:  Diagnosis Date   Acute diarrhea 01/28/2023   Allergy    seasonal   Anxiety    Aortic aneurysm 08/2018   Arthritis    Bilateral hearing loss 03/12/2023   does not wear hearing aids   Blood clotting disorder    Bone spur    with excision   Chickenpox    Colon polyps    DVT (deep venous thrombosis) (HCC)    GERD (gastroesophageal reflux disease)    Glaucoma    both   Graves disease 03/30/2013   History of blood clots 10/2017   Hyperlipemia    Hypertension    Hyperthyroidism    no current problems per pt on 01/12/24   Osteopenia    Prediabetes    no meds, does not check blood sugar   Restless leg syndrome     Past Surgical History:  Procedure Laterality Date   BREAST CYST ASPIRATION     CATARACT EXTRACTION W/ INTRAOCULAR LENS IMPLANT Right 2015   CATARACT EXTRACTION W/PHACO Left 2017   COLONOSCOPY  2014   DILATION AND CURETTAGE OF UTERUS     HYSTEROSCOPY WITH D & C N/A 01/13/2024   Procedure: DILATATION AND CURETTAGE /HYSTEROSCOPY;  Surgeon: Jeralyn Crutch, MD;  Location: [facility] OR;  Service: Gynecology;  Laterality: N/A;  Hysteroscopy/Polyp resect/D&C   MYOSURE RESECTION  01/13/2024   Procedure: MELINDA RESECTION;  Surgeon: Jeralyn Crutch, MD;  Location: [facility] OR;  Service: Gynecology;;    Social History:  reports that she quit smoking about 36 years ago. Her smoking use included cigarettes. She has never used smokeless tobacco. She reports current alcohol use of about 2.0 standard drinks of alcohol per week. She  reports that she does not currently use drugs.  Allergies: Allergies[1]  Family History:  Family History  Problem Relation Age of Onset   Heart disease Mother    Heart attack Father    Breast cancer Sister 67   Colon polyps Sister    Aneurysm Brother    Multiple myeloma Sister        found in stage 4   Breast cancer Maternal Grandmother    Esophageal cancer Maternal Aunt 82   Colon cancer Neg Hx    Rectal cancer Neg Hx    Stomach cancer Neg Hx     Current Medications[2]  Review of Systems:  Negative unless indicated in HPI.   Physical Exam: Vitals:   08/04/24 1403  BP: 110/70  Pulse: 67  Temp: 98.5 F (36.9 C)  TempSrc: Oral  SpO2: 98%  Weight: 196 lb 9.6 oz (89.2 kg)  Height: 5' 6 (1.676 m)    Body mass index is 31.73 kg/m.   Physical Exam Vitals reviewed.  Constitutional:      General: She is not in acute distress.    Appearance: Normal appearance. She is not ill-appearing, toxic-appearing or diaphoretic.  HENT:     Head: Normocephalic.     Right Ear: Tympanic membrane, ear canal and external ear normal. There is no impacted cerumen.     Left  Ear: Tympanic membrane, ear canal and external ear normal. There is no impacted cerumen.     Nose: Nose normal.     Mouth/Throat:     Mouth: Mucous membranes are moist.     Pharynx: Oropharynx is clear. No oropharyngeal exudate or posterior oropharyngeal erythema.  Eyes:     General: No scleral icterus.       Right eye: No discharge.        Left eye: No discharge.     Conjunctiva/sclera: Conjunctivae normal.  Neck:     Vascular: No carotid bruit.  Cardiovascular:     Rate and Rhythm: Normal rate and regular rhythm.     Pulses: Normal pulses.     Heart sounds: Normal heart sounds.  Pulmonary:     Effort: Pulmonary effort is normal. No respiratory distress.     Breath sounds: Normal breath sounds.  Abdominal:     General: Abdomen is flat. Bowel sounds are normal.     Palpations: Abdomen is soft.   Musculoskeletal:        General: Normal range of motion.     Cervical back: Normal range of motion.  Skin:    General: Skin is warm and dry.  Neurological:     General: No focal deficit present.     Mental Status: She is alert and oriented to person, place, and time. Mental status is at baseline.  Psychiatric:        Mood and Affect: Mood normal.        Behavior: Behavior normal.        Thought Content: Thought content normal.        Judgment: Judgment normal.    Subsequent Medicare wellness visit   Visit info / Clinical Intake: Medicare Wellness Visit Type:: Subsequent Annual Wellness Visit Persons participating in visit and providing information:: patient Medicare Wellness Visit Mode:: In-person (required for WTM) Interpreter Needed?: No Pre-visit prep was completed: yes AWV questionnaire completed by patient prior to visit?: yes Date:: 08/01/24 Living arrangements:: (Patient-Rptd) lives with spouse/significant other Patient's Overall Health Status Rating: (Patient-Rptd) good Typical amount of pain: (Patient-Rptd) some Does pain affect daily life?: (Patient-Rptd) no Are you currently prescribed opioids?: no  Dietary Habits and Nutritional Risks How many meals a day?: (Patient-Rptd) 2 Eats fruit and vegetables daily?: (Patient-Rptd) yes Most meals are obtained by: (Patient-Rptd) preparing own meals In the last 2 weeks, have you had any of the following?: (!) nausea, vomiting, diarrhea Diabetic:: no  Functional Status Activities of Daily Living (to include ambulation/medication): (Patient-Rptd) Independent Ambulation: (Patient-Rptd) Independent Medication Administration: (Patient-Rptd) Independent Home Management (perform basic housework or laundry): (Patient-Rptd) Independent Manage your own finances?: (Patient-Rptd) yes Primary transportation is: (Patient-Rptd) driving Concerns about vision?: no *vision screening is required for WTM* Concerns about hearing?:  no  Fall Screening Falls in the past year?: (Patient-Rptd) 1 Number of falls in past year: (Patient-Rptd) 0 Was there an injury with Fall?: (Patient-Rptd) 0 Fall Risk Category Calculator: (Patient-Rptd) 1 Patient Fall Risk Level: (Patient-Rptd) Low Fall Risk  Fall Risk Patient at Risk for Falls Due to: No Fall Risks Fall risk Follow up: Falls evaluation completed  Home and Transportation Safety: All rugs have non-skid backing?: (Patient-Rptd) yes All stairs or steps have railings?: (Patient-Rptd) yes Grab bars in the bathtub or shower?: (Patient-Rptd) yes Have non-skid surface in bathtub or shower?: (Patient-Rptd) yes Good home lighting?: (Patient-Rptd) yes Regular seat belt use?: (Patient-Rptd) yes Hospital stays in the last year:: (Patient-Rptd) no  Cognitive Assessment Difficulty concentrating,  remembering, or making decisions? : (Patient-Rptd) no Will 6CIT or Mini Cog be Completed: yes What year is it?: 0 points What month is it?: 0 points Give patient an address phrase to remember (5 components): the cow jumped over the moon About what time is it?: 0 points Count backwards from 20 to 1: 0 points Say the months of the year in reverse: 0 points Repeat the address phrase from earlier: 0 points 6 CIT Score: 0 points  Advance Directives (For Healthcare) Does Patient Have a Medical Advance Directive?: Yes Does patient want to make changes to medical advance directive?: No - Patient declined Type of Advance Directive: Living will; Healthcare Power of Attorney Copy of Healthcare Power of Attorney in Chart?: No - copy requested Copy of Living Will in Chart?: No - copy requested  Reviewed/Updated  Reviewed/Updated: Reviewed All (Medical, Surgical, Family, Medications, Allergies, Care Teams, Patient Goals)    Vision Screening   Right eye Left eye Both eyes  Without correction     With correction 20/30 20/30 20/30       Depression/mood:  Flowsheet Row Office Visit from  01/28/2023 in [facility] [place]  PHQ-9 Total Score 3        Counseling: Counseling given: Not Answered     Lab orders based on risk factors: Laboratory update will be reviewed     Screening: Patient provided with a written and personalized 5-10 year screening schedule in the AVS. Health Maintenance  Topic Date Due   COVID-19 Vaccine (6 - 2025-26 season) 03/22/2024   Medicare Annual Wellness Visit  08/04/2024*   DTaP/Tdap/Td vaccine (2 - Td or Tdap) 06/19/2026   Pneumococcal Vaccine for age over 7  Completed   Flu Shot  Completed   Osteoporosis screening with Bone Density Scan  Completed   Hepatitis C Screening  Completed   Zoster (Shingles) Vaccine  Completed   Meningitis B Vaccine  Aged Out   Breast Cancer Screening  Discontinued   Colon Cancer Screening  Discontinued  *Topic was postponed. The date shown is not the original due date.     Provider List Update: Patient Care Team    Relationship Specialty Notifications Start End  Theophilus Andrews, Tully GRADE, MD PCP - General Internal Medicine  01/19/24   Loni Soyla LABOR, MD PCP - Cardiology Cardiology Admissions 02/22/19        I have personally reviewed and noted the following in the patients chart:   Medical and social history Use of alcohol, tobacco or illicit drugs  Current medications and supplements Functional ability and status Nutritional status Physical activity Advanced directives List of other physicians Hospitalizations, surgeries, and ER visits in previous 12 months Vitals Screenings to include cognitive, depression, and falls Referrals and appointments  In addition, I have reviewed and discussed with patient certain preventive protocols, quality metrics, and best practice recommendations. A written personalized care plan for preventive services as well as general preventive health recommendations were provided to patient.   Impression and Plan:  Medicare annual  wellness visit, subsequent  -Recommend routine eye and dental care. -Healthy lifestyle discussed in detail. -Labs to be updated today. -Prostate cancer screening: Not applicable Health Maintenance  Topic Date Due   COVID-19 Vaccine (6 - 2025-26 season) 03/22/2024   Medicare Annual Wellness Visit  08/04/2024*   DTaP/Tdap/Td vaccine (2 - Td or Tdap) 06/19/2026   Pneumococcal Vaccine for age over 75  Completed   Flu Shot  Completed   Osteoporosis screening with  Bone Density Scan  Completed   Hepatitis C Screening  Completed   Zoster (Shingles) Vaccine  Completed   Meningitis B Vaccine  Aged Out   Breast Cancer Screening  Discontinued   Colon Cancer Screening  Discontinued  *Topic was postponed. The date shown is not the original due date.       Tully Theophilus Andrews, MD [facility]     [1]  Allergies Allergen Reactions   Amitriptyline Other (See Comments)    hallucinations   Codeine    Crestor  [Rosuvastatin ]     Myalgias    Lipitor [Atorvastatin ]     Myaglias    Prednisone     Depression  [2]  Current Outpatient Medications:    acetaminophen  (TYLENOL ) 500 MG tablet, Take 500 mg by mouth as needed for mild pain., Disp: , Rfl:    ALPHAGAN P 0.1 % SOLN, Place 1 drop into both eyes 2 (two) times daily., Disp: , Rfl:    amLODipine -benazepril  (LOTREL) 5-20 MG capsule, TAKE 1 CAPSULE BY MOUTH EVERY DAY FOR BLOOD PRESSURE, Disp: 90 capsule, Rfl: 1   b complex vitamins capsule, Take 1 capsule by mouth daily., Disp: , Rfl:    Bempedoic Acid -Ezetimibe  (NEXLIZET ) 180-10 MG TABS, Take 1 tablet by mouth daily., Disp: 30 tablet, Rfl: 11   Calcium  Carb-Cholecalciferol (CALCIUM  + VITAMIN D3 PO), Take 1 tablet by mouth daily., Disp: , Rfl:    cetirizine  (ZYRTEC ) 10 MG tablet, Take 1 tablet (10 mg total) by mouth daily., Disp: 30 tablet, Rfl: 11   cholecalciferol (VITAMIN D) 1000 units tablet, Take 1,000 Units by mouth daily., Disp: , Rfl:    cyanocobalamin  (VITAMIN B12) 1000 MCG tablet, Take 1,000 mcg by mouth daily., Disp: , Rfl:    dorzolamide-timolol (COSOPT) 22.3-6.8 MG/ML ophthalmic solution, Place 1 drop into both eyes 2 (two) times daily., Disp: , Rfl:    ezetimibe  (ZETIA ) 10 MG tablet, Take 10 mg by mouth daily., Disp: , Rfl:    FLUoxetine  (PROZAC ) 20 MG capsule, Take 1 capsule (20 mg total) by mouth daily., Disp: 90 capsule, Rfl: 0   fluticasone  (FLONASE ) 50 MCG/ACT nasal spray, Place 2 sprays into both nostrils daily., Disp: 16 g, Rfl: 6   MAGNESIUM PO, Take by mouth., Disp: , Rfl:    Multiple Vitamin (MULTIVITAMIN) tablet, Take 1 tablet by mouth daily., Disp: , Rfl:    OVER THE COUNTER MEDICATION, Apply 1 application  topically as needed (pain). Blue - Emu, Disp: , Rfl:    OVER THE COUNTER MEDICATION, Bio true, Disp: , Rfl:    pantoprazole  (PROTONIX ) 20 MG tablet, TAKE 1 TABLET BY MOUTH 2 TIMES DAILY BEFORE A MEAL FOR HEARTBURN., Disp: 180 tablet, Rfl: 2   promethazine -dextromethorphan (PROMETHAZINE -DM) 6.25-15 MG/5ML syrup, Take 5 mLs by mouth 4 (four) times daily as needed for cough., Disp: 118 mL, Rfl: 0   sodium chloride  (OCEAN) 0.65 % SOLN nasal spray, Place 1 spray into both nostrils as needed., Disp: 30 mL, Rfl: 5   traZODone  (DESYREL ) 50 MG tablet, TAKE 1 TABLET BY MOUTH AT BEDTIME AS NEEDED FOR SLEEP, Disp: 90 tablet, Rfl: 1   trolamine salicylate (ASPERCREME) 10 % cream, Apply 1 application  topically as needed for muscle pain., Disp: , Rfl:   "

## 2024-08-10 ENCOUNTER — Telehealth: Payer: Self-pay | Admitting: *Deleted

## 2024-08-10 NOTE — Telephone Encounter (Signed)
 error

## 2025-01-10 ENCOUNTER — Ambulatory Visit: Admitting: Internal Medicine

## 2025-02-02 ENCOUNTER — Ambulatory Visit: Admitting: Internal Medicine
